# Patient Record
Sex: Male | Born: 1940 | Race: White | Hispanic: No | Smoking: Former smoker
Health system: Southern US, Community
[De-identification: ages and names within clinical notes are randomized; demographics above are authoritative.]

## PROBLEM LIST (undated history)

## (undated) DIAGNOSIS — K5790 Diverticulosis of intestine, part unspecified, without perforation or abscess without bleeding: Secondary | ICD-10-CM

## (undated) DIAGNOSIS — Z8719 Personal history of other diseases of the digestive system: Secondary | ICD-10-CM

## (undated) DIAGNOSIS — Z972 Presence of dental prosthetic device (complete) (partial): Secondary | ICD-10-CM

## (undated) DIAGNOSIS — S88911A Complete traumatic amputation of right lower leg, level unspecified, initial encounter: Secondary | ICD-10-CM

## (undated) DIAGNOSIS — I700 Atherosclerosis of aorta: Secondary | ICD-10-CM

## (undated) DIAGNOSIS — J449 Chronic obstructive pulmonary disease, unspecified: Secondary | ICD-10-CM

## (undated) DIAGNOSIS — K635 Polyp of colon: Secondary | ICD-10-CM

## (undated) DIAGNOSIS — E785 Hyperlipidemia, unspecified: Secondary | ICD-10-CM

## (undated) DIAGNOSIS — I2510 Atherosclerotic heart disease of native coronary artery without angina pectoris: Secondary | ICD-10-CM

## (undated) DIAGNOSIS — N1330 Unspecified hydronephrosis: Secondary | ICD-10-CM

## (undated) DIAGNOSIS — N201 Calculus of ureter: Secondary | ICD-10-CM

## (undated) DIAGNOSIS — E559 Vitamin D deficiency, unspecified: Secondary | ICD-10-CM

## (undated) DIAGNOSIS — H269 Unspecified cataract: Secondary | ICD-10-CM

## (undated) DIAGNOSIS — C4490 Unspecified malignant neoplasm of skin, unspecified: Secondary | ICD-10-CM

## (undated) DIAGNOSIS — K222 Esophageal obstruction: Secondary | ICD-10-CM

## (undated) DIAGNOSIS — G459 Transient cerebral ischemic attack, unspecified: Secondary | ICD-10-CM

## (undated) DIAGNOSIS — I219 Acute myocardial infarction, unspecified: Secondary | ICD-10-CM

## (undated) DIAGNOSIS — I519 Heart disease, unspecified: Secondary | ICD-10-CM

## (undated) DIAGNOSIS — K219 Gastro-esophageal reflux disease without esophagitis: Secondary | ICD-10-CM

## (undated) DIAGNOSIS — Z87891 Personal history of nicotine dependence: Secondary | ICD-10-CM

## (undated) DIAGNOSIS — J45909 Unspecified asthma, uncomplicated: Secondary | ICD-10-CM

## (undated) DIAGNOSIS — N200 Calculus of kidney: Secondary | ICD-10-CM

## (undated) HISTORY — DX: Polyp of colon: K63.5

## (undated) HISTORY — DX: Unspecified asthma, uncomplicated: J45.909

## (undated) HISTORY — PX: CATARACT EXTRACTION, BILATERAL: SHX1313

## (undated) HISTORY — DX: Esophageal obstruction: K22.2

## (undated) HISTORY — DX: Acute myocardial infarction, unspecified: I21.9

## (undated) HISTORY — DX: Personal history of nicotine dependence: Z87.891

## (undated) HISTORY — DX: Gastro-esophageal reflux disease without esophagitis: K21.9

## (undated) HISTORY — DX: Vitamin D deficiency, unspecified: E55.9

## (undated) HISTORY — PX: ESOPHAGOGASTRODUODENOSCOPY: SHX1529

## (undated) HISTORY — PX: INGUINAL HERNIA REPAIR: SHX194

## (undated) HISTORY — DX: Transient cerebral ischemic attack, unspecified: G45.9

## (undated) HISTORY — DX: Diverticulosis of intestine, part unspecified, without perforation or abscess without bleeding: K57.90

## (undated) HISTORY — PX: COLONOSCOPY: SHX174

---

## 1977-05-14 HISTORY — PX: OTHER SURGICAL HISTORY: SHX169

## 1981-06-08 HISTORY — PX: CARDIAC CATHETERIZATION: SHX172

## 1997-07-12 ENCOUNTER — Ambulatory Visit (HOSPITAL_COMMUNITY): Admission: RE | Admit: 1997-07-12 | Discharge: 1997-07-12 | Payer: Self-pay | Admitting: Internal Medicine

## 1998-06-08 HISTORY — PX: SHOULDER ARTHROSCOPY: SHX128

## 1998-12-21 ENCOUNTER — Emergency Department (HOSPITAL_COMMUNITY): Admission: EM | Admit: 1998-12-21 | Discharge: 1998-12-21 | Payer: Self-pay | Admitting: Emergency Medicine

## 1998-12-21 ENCOUNTER — Encounter: Payer: Self-pay | Admitting: Emergency Medicine

## 1999-01-31 ENCOUNTER — Encounter: Payer: Self-pay | Admitting: Orthopedic Surgery

## 1999-01-31 ENCOUNTER — Ambulatory Visit (HOSPITAL_COMMUNITY): Admission: RE | Admit: 1999-01-31 | Discharge: 1999-01-31 | Payer: Self-pay | Admitting: Orthopedic Surgery

## 1999-02-26 ENCOUNTER — Encounter: Payer: Self-pay | Admitting: Orthopedic Surgery

## 1999-03-06 ENCOUNTER — Observation Stay (HOSPITAL_COMMUNITY): Admission: RE | Admit: 1999-03-06 | Discharge: 1999-03-07 | Payer: Self-pay | Admitting: Orthopedic Surgery

## 1999-10-13 ENCOUNTER — Encounter: Payer: Self-pay | Admitting: Internal Medicine

## 1999-10-13 ENCOUNTER — Ambulatory Visit (HOSPITAL_COMMUNITY): Admission: RE | Admit: 1999-10-13 | Discharge: 1999-10-13 | Payer: Self-pay | Admitting: Internal Medicine

## 2001-04-11 ENCOUNTER — Encounter: Payer: Self-pay | Admitting: Internal Medicine

## 2001-04-11 ENCOUNTER — Ambulatory Visit (HOSPITAL_COMMUNITY): Admission: RE | Admit: 2001-04-11 | Discharge: 2001-04-11 | Payer: Self-pay | Admitting: Internal Medicine

## 2001-04-29 ENCOUNTER — Emergency Department (HOSPITAL_COMMUNITY): Admission: EM | Admit: 2001-04-29 | Discharge: 2001-04-29 | Payer: Self-pay | Admitting: Emergency Medicine

## 2002-06-06 ENCOUNTER — Ambulatory Visit (HOSPITAL_COMMUNITY): Admission: RE | Admit: 2002-06-06 | Discharge: 2002-06-06 | Payer: Self-pay | Admitting: Internal Medicine

## 2002-06-06 ENCOUNTER — Encounter: Payer: Self-pay | Admitting: Internal Medicine

## 2004-12-10 ENCOUNTER — Ambulatory Visit (HOSPITAL_COMMUNITY): Admission: RE | Admit: 2004-12-10 | Discharge: 2004-12-10 | Payer: Self-pay | Admitting: Internal Medicine

## 2004-12-17 ENCOUNTER — Ambulatory Visit: Payer: Self-pay | Admitting: Internal Medicine

## 2004-12-25 ENCOUNTER — Encounter (INDEPENDENT_AMBULATORY_CARE_PROVIDER_SITE_OTHER): Payer: Self-pay | Admitting: *Deleted

## 2004-12-25 ENCOUNTER — Ambulatory Visit: Payer: Self-pay | Admitting: Internal Medicine

## 2007-04-04 ENCOUNTER — Ambulatory Visit (HOSPITAL_COMMUNITY): Admission: RE | Admit: 2007-04-04 | Discharge: 2007-04-04 | Payer: Self-pay | Admitting: Internal Medicine

## 2008-05-29 ENCOUNTER — Ambulatory Visit (HOSPITAL_COMMUNITY): Admission: RE | Admit: 2008-05-29 | Discharge: 2008-05-29 | Payer: Self-pay | Admitting: Internal Medicine

## 2009-06-10 ENCOUNTER — Ambulatory Visit (HOSPITAL_COMMUNITY): Admission: RE | Admit: 2009-06-10 | Discharge: 2009-06-10 | Payer: Self-pay | Admitting: Internal Medicine

## 2009-11-22 ENCOUNTER — Encounter (INDEPENDENT_AMBULATORY_CARE_PROVIDER_SITE_OTHER): Payer: Self-pay | Admitting: *Deleted

## 2010-06-24 ENCOUNTER — Ambulatory Visit (HOSPITAL_COMMUNITY)
Admission: RE | Admit: 2010-06-24 | Discharge: 2010-06-24 | Payer: Self-pay | Source: Home / Self Care | Attending: Internal Medicine | Admitting: Internal Medicine

## 2010-07-08 NOTE — Letter (Signed)
Summary: Colonoscopy Letter  [facility]  [address] [place], [place] [address]   Phone: [phone]  Fax: [phone]      November 22, 2009 MRN: [id number]   [facility] [address] [place], [place]  [address]   Dear Mr. Weekley,   According to your medical record, it is time for you to schedule a Colonoscopy. The American Cancer Society recommends this procedure as a method to detect early colon cancer. Patients with a family history of colon cancer, or a personal history of colon polyps or inflammatory bowel disease are at increased risk.  This letter has beeen generated based on the recommendations made at the time of your procedure. If you feel that in your particular situation this may no longer apply, please contact our office.  Please call our office at [phone] to schedule this appointment or to update your records at your earliest convenience.  Thank you for cooperating with [place] to provide you with the very best care possible.   Sincerely,    Iva Boop, M.D.  [facility] [phone]

## 2010-07-11 ENCOUNTER — Encounter (INDEPENDENT_AMBULATORY_CARE_PROVIDER_SITE_OTHER): Payer: Self-pay | Admitting: *Deleted

## 2010-07-16 NOTE — Letter (Signed)
Summary: Pre Visit No Show Letter  [facility]  [address] [place], [place] [address]   Phone: [phone]  Fax: [phone]        July 11, 2010 MRN: [id number]    [facility] [address] [place], [place]  [address]    Dear Mr. Brathwaite,   We have been unable to reach you by phone concerning the pre-procedure visit that you missed on Friday 07/11/2010. For this reason,your procedure scheduled on Tuesday 08/12/2010 has been cancelled. Our scheduling staff will gladly assist you with rescheduling your appointments at a more convenient time. Please call our office at [phone] between the hours of 8:00am and 5:00pm, press option #2 to reach an appointment scheduler. Please consider updating your contact numbers at this time so that we can reach you by phone in the future with schedule changes or results.    Thank you,    Ezra Sites RN [facility]

## 2010-07-23 ENCOUNTER — Other Ambulatory Visit: Payer: Self-pay | Admitting: Internal Medicine

## 2010-07-28 ENCOUNTER — Encounter (INDEPENDENT_AMBULATORY_CARE_PROVIDER_SITE_OTHER): Payer: Self-pay

## 2010-07-29 ENCOUNTER — Encounter: Payer: Self-pay | Admitting: Internal Medicine

## 2010-08-05 NOTE — Miscellaneous (Signed)
Summary: Lec previsit  Clinical Lists Changes  Medications: Added new medication of MOVIPREP 100 GM  SOLR (PEG-KCL-NACL-NASULF-NA ASC-C) As per prep instructions. - Signed Rx of MOVIPREP 100 GM  SOLR (PEG-KCL-NACL-NASULF-NA ASC-C) As per prep instructions.;  #1 x 0;  Signed;  Entered by: Ulis Rias RN;  Authorized by: Iva Boop MD, [facility];  Method used: Electronically to [facility] [address] [place], [place], [place]  [address], Ph: [phone], Fax: [phone] Observations: Added new observation of NKA: T (07/29/2010 13:28)    Prescriptions: MOVIPREP 100 GM  SOLR (PEG-KCL-NACL-NASULF-NA ASC-C) As per prep instructions.  #1 x 0   Entered by:   Ulis Rias RN   Authorized by:   Iva Boop MD, [facility]   Signed by:   Ulis Rias RN on 07/29/2010   Method used:   Electronically to        [facility] (retail)       [address]       [place], [place]  [address]       Ph: [phone]       Fax: [phone]   RxID:   [id number]

## 2010-08-05 NOTE — Letter (Signed)
Summary: [facility] Instructions  [facility]  [address] [place], [place] [address]   Phone: [phone]  Fax: [phone]       Leslie Cardenas    [birth year]    MRN: [id number]        Procedure Day /Date: Tuesday 08/12/2010     Arrival Time: 1:00 pm     Procedure Time: 2:00 pm     Location of Procedure:                    _ x_  [facility] (4th Floor)                        PREPARATION FOR COLONOSCOPY WITH MOVIPREP   Starting 5 days prior to your procedure Thursday 3/1 do not eat nuts, seeds, popcorn, corn, beans, peas,  salads, or any raw vegetables.  Do not take any fiber supplements (e.g. Metamucil, Citrucel, and Benefiber).  THE DAY BEFORE YOUR PROCEDURE         DATE: Wednesday 3/5  1.  Drink clear liquids the entire day-NO SOLID FOOD  2.  Do not drink anything colored red or purple.  Avoid juices with pulp.  No orange juice.  3.  Drink at least 64 oz. (8 glasses) of fluid/clear liquids during the day to prevent dehydration and help the prep work efficiently.  CLEAR LIQUIDS INCLUDE: Water Jello Ice Popsicles Tea (sugar ok, no milk/cream) Powdered fruit flavored drinks Coffee (sugar ok, no milk/cream) Gatorade Juice: apple, white grape, white cranberry  Lemonade Clear bullion, consomm, broth Carbonated beverages (any kind) Strained chicken noodle soup Hard Candy                             4.  In the morning, mix first dose of MoviPrep solution:    Empty 1 Pouch A and 1 Pouch B into the disposable container    Add lukewarm drinking water to the top line of the container. Mix to dissolve    Refrigerate (mixed solution should be used within 24 hrs)  5.  Begin drinking the prep at 5:00 p.m. The MoviPrep container is divided by 4 marks.   Every 15 minutes drink the solution down to the next mark (approximately 8 oz) until the full liter is complete.   6.  Follow completed prep with 16 oz of clear liquid of your choice (Nothing  red or purple).  Continue to drink clear liquids until bedtime.  7.  Before going to bed, mix second dose of MoviPrep solution:    Empty 1 Pouch A and 1 Pouch B into the disposable container    Add lukewarm drinking water to the top line of the container. Mix to dissolve    Refrigerate  THE DAY OF YOUR PROCEDURE      DATE: Thursday 3/6  Beginning at 9:00 a.m. (5 hours before procedure):         1. Every 15 minutes, drink the solution down to the next mark (approx 8 oz) until the full liter is complete.  2. Follow completed prep with 16 oz. of clear liquid of your choice.    3. You may drink clear liquids until 12:00 pm (2 HOURS BEFORE PROCEDURE).   MEDICATION INSTRUCTIONS  Unless otherwise instructed, you should take regular prescription medications with a small sip of water   as early as possible the morning of your procedure.  OTHER INSTRUCTIONS  You will need a responsible adult at least 71 years of age to accompany you and drive you home.   This person must remain in the waiting room during your procedure.  Wear loose fitting clothing that is easily removed.  Leave jewelry and other valuables at home.  However, you may wish to bring a book to read or  an iPod/MP3 player to listen to music as you wait for your procedure to start.  Remove all body piercing jewelry and leave at home.  Total time from sign-in until discharge is approximately 2-3 hours.  You should go home directly after your procedure and rest.  You can resume normal activities the  day after your procedure.  The day of your procedure you should not:   Drive   Make legal decisions   Operate machinery   Drink alcohol   Return to work  You will receive specific instructions about eating, activities and medications before you leave.    The above instructions have been reviewed and explained to me by   Ulis Rias RN  July 29, 2010 1:40 PM     I fully understand and can  verbalize these instructions _____________________________ Date _________

## 2010-08-12 ENCOUNTER — Other Ambulatory Visit: Payer: Self-pay | Admitting: Internal Medicine

## 2010-08-12 ENCOUNTER — Other Ambulatory Visit (AMBULATORY_SURGERY_CENTER): Payer: 59 | Admitting: Internal Medicine

## 2010-08-12 DIAGNOSIS — D126 Benign neoplasm of colon, unspecified: Secondary | ICD-10-CM

## 2010-08-12 DIAGNOSIS — K5730 Diverticulosis of large intestine without perforation or abscess without bleeding: Secondary | ICD-10-CM

## 2010-08-12 DIAGNOSIS — Z1211 Encounter for screening for malignant neoplasm of colon: Secondary | ICD-10-CM

## 2010-08-12 DIAGNOSIS — Z860100 Personal history of colon polyps, unspecified: Secondary | ICD-10-CM

## 2010-08-12 HISTORY — PX: COLONOSCOPY W/ POLYPECTOMY: SHX1380

## 2010-08-12 LAB — HM COLONOSCOPY

## 2010-08-19 ENCOUNTER — Encounter: Payer: Self-pay | Admitting: Internal Medicine

## 2010-08-19 NOTE — Procedures (Addendum)
Summary: Colonoscopy  Patient: Leslie Cardenas Note: All result statuses are Final unless otherwise noted.  Tests: (1) Colonoscopy (COL)   COL Colonoscopy           DONE     [facility]     [address]     [place], [place]  [address]          COLONOSCOPY PROCEDURE REPORT          PATIENT:  Leslie Cardenas, Leslie Cardenas  MR#:  [id number]     BIRTHDATE:  Jul 27, 1940, 69 yrs. old  GENDER:  male     ENDOSCOPIST:  Iva Boop, MD, [facility]     REF. 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  Digital rectal exam was performed and     revealed no abnormalities.   The LB 180AL [id number] endoscope was     introduced through the anus and advanced to the cecum, which was     identified by both the appendix and ileocecal valve, without     limitations.  The quality of the prep was excellent, using     MoviPrep.  The instrument was then slowly withdrawn as the colon     was fully examined. Insertion: 2:50 minutes Withdrawal: 7:22     minutes     <<PROCEDUREIMAGES>>          FINDINGS:  Three polyps were found. Descending (2, </=41mm), and     transverse colon (6-59mm). Polyps were snared without cautery.     Retrieval was successful.  Moderate diverticulosis was found in     the sigmoid colon.  This was otherwise a normal examination of the     colon.   Retroflexed views in the rectum revealed no     abnormalities.    The scope was then withdrawn from the patient     and the procedure completed.          COMPLICATIONS:   None     ENDOSCOPIC IMPRESSION:     1) Three polyps removed, largest 6-7 mm     2) Moderate diverticulosis in the sigmoid colon     3) Otherwise normal examination     4) Personal history adenoma removal (2), largest 6 mm, 12/2004          REPEAT EXAM:  In for Colonoscopy, pending biopsy results.          Iva Boop, MD, [facility]          CC:  Lucky Cowboy, MD     The Patient          n.     eSIGNED:   Iva Boop at 08/12/2010 02:25 PM          [name], [id number]  Note: An exclamation mark (!) indicates a result that was  not dispersed into the flowsheet. Document Creation Date: 08/12/2010 2:26 PM _______________________________________________________________________  (1) Order result status: Final Collection or observation date-time: 08/12/2010 14:15 Requested date-time:  Receipt date-time:  Reported date-time:  Referring Physician:   Ordering Physician: Stan Head [phone]) Specimen Source:  Source: Launa Grill Order Number: [id number] Lab site:   Appended Document: Colonoscopy     Procedures Next Due Date:    Colonoscopy: 08/2013

## 2010-08-26 NOTE — Letter (Signed)
Summary: Patient Notice- Polyp Results  [facility]  [address] [place], [place] [address]   Phone: [phone]  Fax: [phone]        August 19, 2010 MRN: [id number]    [facility] [address] [place], [place]  [address]    Dear Leslie Cardenas,  The polyps removed from your colon were adenomatous. This means that they were pre-cancerous or that  they had the potential to change into cancer over time.   I recommend that you have a repeat colonoscopy in about 3 years (2015) to determine if you have developed any new polyps over time and screen for colorectal cancer. If you develop any new rectal bleeding, abdominal pain or significant bowel habit changes, please contact us before then.  In addition to repeating colonoscopy, changing health habits may reduce your risk of having more colon or rectal  polyps and possibly, colorectal cancer. You may lower your risk of future polyps and colorectal cancer by adopting healthy habits such as not smoking or using tobacco (if you do), being physically active, losing weight (if overweight), and eating a diet which includes fruits and vegetables and limits red meat.  Please call us if you are having persistent problems or have questions about your condition that have not been fully answered at this time.  Sincerely,  Iva Boop MD, [facility]  This letter has been electronically signed by your physician.  Appended Document: Patient Notice- Polyp Results letter mailed

## 2011-11-13 ENCOUNTER — Encounter (HOSPITAL_COMMUNITY): Payer: Self-pay | Admitting: Emergency Medicine

## 2011-11-13 ENCOUNTER — Other Ambulatory Visit: Payer: Self-pay

## 2011-11-13 ENCOUNTER — Emergency Department (HOSPITAL_COMMUNITY)
Admission: EM | Admit: 2011-11-13 | Discharge: 2011-11-13 | Disposition: A | Discharge status: Home / Self Care | Payer: 59 | Source: Home / Self Care | Attending: Emergency Medicine | Admitting: Emergency Medicine

## 2011-11-13 ENCOUNTER — Emergency Department (HOSPITAL_COMMUNITY): Payer: Medicare PPO

## 2011-11-13 ENCOUNTER — Inpatient Hospital Stay (HOSPITAL_COMMUNITY)
Admission: EM | Admit: 2011-11-13 | Discharge: 2011-11-14 | DRG: 313 | Disposition: A | Discharge status: Home / Self Care | Payer: Medicare PPO | Attending: Cardiology | Admitting: Cardiology

## 2011-11-13 DIAGNOSIS — I200 Unstable angina: Secondary | ICD-10-CM

## 2011-11-13 DIAGNOSIS — J449 Chronic obstructive pulmonary disease, unspecified: Secondary | ICD-10-CM

## 2011-11-13 DIAGNOSIS — E782 Mixed hyperlipidemia: Secondary | ICD-10-CM

## 2011-11-13 DIAGNOSIS — J4489 Other specified chronic obstructive pulmonary disease: Secondary | ICD-10-CM | POA: Diagnosis present

## 2011-11-13 DIAGNOSIS — Z7982 Long term (current) use of aspirin: Secondary | ICD-10-CM

## 2011-11-13 DIAGNOSIS — I10 Essential (primary) hypertension: Secondary | ICD-10-CM | POA: Diagnosis present

## 2011-11-13 DIAGNOSIS — I2510 Atherosclerotic heart disease of native coronary artery without angina pectoris: Secondary | ICD-10-CM | POA: Diagnosis present

## 2011-11-13 DIAGNOSIS — I252 Old myocardial infarction: Secondary | ICD-10-CM

## 2011-11-13 DIAGNOSIS — R079 Chest pain, unspecified: Secondary | ICD-10-CM

## 2011-11-13 DIAGNOSIS — E785 Hyperlipidemia, unspecified: Secondary | ICD-10-CM | POA: Diagnosis present

## 2011-11-13 DIAGNOSIS — R0789 Other chest pain: Principal | ICD-10-CM | POA: Diagnosis present

## 2011-11-13 DIAGNOSIS — Z8249 Family history of ischemic heart disease and other diseases of the circulatory system: Secondary | ICD-10-CM

## 2011-11-13 DIAGNOSIS — R001 Bradycardia, unspecified: Secondary | ICD-10-CM

## 2011-11-13 DIAGNOSIS — IMO0002 Reserved for concepts with insufficient information to code with codable children: Secondary | ICD-10-CM

## 2011-11-13 DIAGNOSIS — Z87891 Personal history of nicotine dependence: Secondary | ICD-10-CM

## 2011-11-13 DIAGNOSIS — Z79899 Other long term (current) drug therapy: Secondary | ICD-10-CM

## 2011-11-13 DIAGNOSIS — I498 Other specified cardiac arrhythmias: Secondary | ICD-10-CM | POA: Diagnosis present

## 2011-11-13 HISTORY — DX: Atherosclerotic heart disease of native coronary artery without angina pectoris: I25.10

## 2011-11-13 HISTORY — DX: Complete traumatic amputation of right lower leg, level unspecified, initial encounter: S88.911A

## 2011-11-13 HISTORY — DX: Hyperlipidemia, unspecified: E78.5

## 2011-11-13 HISTORY — DX: Chronic obstructive pulmonary disease, unspecified: J44.9

## 2011-11-13 LAB — DIFFERENTIAL
Eosinophils Absolute: 0.2 10*3/uL (ref 0.0–0.7)
Eosinophils Relative: 3 % (ref 0–5)
Lymphocytes Relative: 20 % (ref 12–46)
Monocytes Relative: 8 % (ref 3–12)
Neutro Abs: 4.9 10*3/uL (ref 1.7–7.7)
Neutrophils Relative %: 68 % (ref 43–77)

## 2011-11-13 LAB — CBC
HCT: 38.9 % — ABNORMAL LOW (ref 39.0–52.0)
Hemoglobin: 14 g/dL (ref 13.0–17.0)
Hemoglobin: 13.8 g/dL (ref 13.0–17.0)
MCH: 34.1 pg — ABNORMAL HIGH (ref 26.0–34.0)
MCHC: 35.3 g/dL (ref 30.0–36.0)
MCHC: 35.5 g/dL (ref 30.0–36.0)
MCV: 96.1 fL (ref 78.0–100.0)
MCV: 96 fL (ref 78.0–100.0)
Platelets: 322 10*3/uL (ref 150–400)
RBC: 4.13 MIL/uL — ABNORMAL LOW (ref 4.22–5.81)
RDW: 12.9 % (ref 11.5–15.5)
RDW: 12.9 % (ref 11.5–15.5)

## 2011-11-13 LAB — BASIC METABOLIC PANEL
CO2: 27 mEq/L (ref 19–32)
Chloride: 101 mEq/L (ref 96–112)
GFR calc non Af Amer: 83 mL/min — ABNORMAL LOW (ref >90)
Glucose, Bld: 93 mg/dL (ref 70–99)
Potassium: 3.6 mEq/L (ref 3.5–5.1)

## 2011-11-13 LAB — CARDIAC PANEL(CRET KIN+CKTOT+MB+TROPI): Total CK: 22 U/L (ref 7–232)

## 2011-11-13 MED ORDER — SODIUM CHLORIDE 0.9 % IV SOLN
Freq: Once | INTRAVENOUS | Status: AC
  Administered 2011-11-13: 17:00:00 via INTRAVENOUS

## 2011-11-13 MED ORDER — ACETAMINOPHEN 325 MG PO TABS: 650.0000 mg | ORAL_TABLET | ORAL | Status: DC | PRN

## 2011-11-13 MED ORDER — NITROGLYCERIN 0.4 MG SL SUBL: 0.4000 mg | SUBLINGUAL_TABLET | SUBLINGUAL | Status: DC | PRN

## 2011-11-13 MED ORDER — NITROGLYCERIN 0.4 MG SL SUBL
SUBLINGUAL_TABLET | SUBLINGUAL | Status: AC
  Filled 2011-11-13: qty 25

## 2011-11-13 MED ORDER — SODIUM CHLORIDE 0.9 % IJ SOLN
3.0000 mL | Freq: Two times a day (BID) | INTRAMUSCULAR | Status: DC
  Administered 2011-11-14: 3 mL via INTRAVENOUS

## 2011-11-13 MED ORDER — ALPRAZOLAM 0.25 MG PO TABS: 0.2500 mg | ORAL_TABLET | Freq: Two times a day (BID) | ORAL | Status: DC | PRN

## 2011-11-13 MED ORDER — ASPIRIN EC 81 MG PO TBEC
81.0000 mg | DELAYED_RELEASE_TABLET | Freq: Every day | ORAL | Status: DC
  Administered 2011-11-14: 81 mg via ORAL
  Filled 2011-11-13: qty 1

## 2011-11-13 MED ORDER — SODIUM CHLORIDE 0.9 % IV SOLN: 250.0000 mL | INTRAVENOUS | Status: DC | PRN

## 2011-11-13 MED ORDER — NITROGLYCERIN 0.4 MG SL SUBL
0.4000 mg | SUBLINGUAL_TABLET | SUBLINGUAL | Status: DC | PRN
  Administered 2011-11-13 (×2): 0.4 mg via SUBLINGUAL

## 2011-11-13 MED ORDER — ASPIRIN EC 325 MG PO TBEC: 325.0000 mg | DELAYED_RELEASE_TABLET | Freq: Once | ORAL | Status: DC

## 2011-11-13 MED ORDER — ZOLPIDEM TARTRATE 5 MG PO TABS: 5.0000 mg | ORAL_TABLET | Freq: Every evening | ORAL | Status: DC | PRN

## 2011-11-13 MED ORDER — ENOXAPARIN SODIUM 40 MG/0.4ML ~~LOC~~ SOLN
40.0000 mg | SUBCUTANEOUS | Status: DC
  Administered 2011-11-13: 40 mg via SUBCUTANEOUS
  Filled 2011-11-13 (×2): qty 0.4

## 2011-11-13 MED ORDER — HEPARIN BOLUS VIA INFUSION: 4000.0000 [IU] | Freq: Once | INTRAVENOUS | Status: DC

## 2011-11-13 MED ORDER — ASPIRIN 81 MG PO CHEW
CHEWABLE_TABLET | ORAL | Status: AC
  Filled 2011-11-13: qty 4

## 2011-11-13 MED ORDER — ONDANSETRON HCL 4 MG/2ML IJ SOLN: 4.0000 mg | Freq: Four times a day (QID) | INTRAMUSCULAR | Status: DC | PRN

## 2011-11-13 MED ORDER — ATORVASTATIN CALCIUM 40 MG PO TABS
40.0000 mg | ORAL_TABLET | Freq: Every day | ORAL | Status: DC
  Filled 2011-11-13: qty 1

## 2011-11-13 MED ORDER — HEPARIN (PORCINE) IN NACL 100-0.45 UNIT/ML-% IJ SOLN
1000.0000 [IU]/h | Freq: Once | INTRAMUSCULAR | Status: DC
  Filled 2011-11-13: qty 250

## 2011-11-13 MED ORDER — MORPHINE SULFATE 2 MG/ML IJ SOLN
2.0000 mg | Freq: Once | INTRAMUSCULAR | Status: AC
  Administered 2011-11-13: 2 mg via INTRAVENOUS
  Filled 2011-11-13: qty 1

## 2011-11-13 MED ORDER — SODIUM CHLORIDE 0.9 % IJ SOLN: 3.0000 mL | INTRAMUSCULAR | Status: DC | PRN

## 2011-11-13 MED ORDER — ASPIRIN 81 MG PO CHEW
324.0000 mg | CHEWABLE_TABLET | Freq: Once | ORAL | Status: AC
Start: 2011-11-13 — End: 2011-11-13
  Administered 2011-11-13: 324 mg via ORAL

## 2011-11-13 NOTE — ED Notes (Signed)
Per [facility]: pt from [facility] c/o 10/10 CP midsternal; pt given 2 SL nitro without effect; pt sts exertional SOB; pt denies diaphoresis or dizziness; pt sent here for further eval

## 2011-11-13 NOTE — ED Notes (Signed)
Pt reports has been having pain for past few weeks in mid chest. Pain starting at night, 8pm, takes a few motrin and it goes away, has been waking up at night to cough. Pain at rest, pain like a cramp that squeezes , comes and goes , pain worse w breathing deep, or movement. Trip over here, would feel like he had been punched in chest w every lurch of car (stop and start)

## 2011-11-13 NOTE — ED Notes (Signed)
EDP notified of change in HR

## 2011-11-13 NOTE — ED Notes (Signed)
Cardiology MD at bedside.

## 2011-11-13 NOTE — H&P (Signed)
History and Physical   Patient ID: Leslie Cardenas MRN: [id number], DOB/AGE: Jul 24, 1940   Admit date: 11/13/2011 Date of Consult: 11/13/2011   Primary Physician: No primary provider on file. Primary Cardiologist: New to cardiology  Pt. Profile: 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   Problem List: Past Medical History  Diagnosis Date  . MI (myocardial infarction) 1983  . Coronary artery disease   . COPD (chronic obstructive pulmonary disease)   . Traumatic amputation of right leg   . Hyperlipidemia     Past Surgical History  Procedure Date  . Right leg amputation   . Cardiac catheterization 1983    No intervention performed.      Allergies: No Known Allergies  HPI:  He reports experiencing substernal chest pain radiating to his left chest occurring reliably in the evening each day for the past week. He describes it as a pressure-like discomfort lasting 2-3 hours at a time. The first time it occurred, he took Tums without relief. Later in the week he took Advil x 4 with relief. Aggravating factors include position changes and inspiration. He endorses associated SOB (has SOB at baseline), increased DOE, occasional palpitations, lightheadedness. He denies n/v, diaphoresis, LE edema or PND. He notes baseline chronic cough productive of white sputum. He denies fevers, chills, diarrhea or sick contacts. No recent illness.   Upon ED arrival, EKG revealed no ischemia changes. Initial troponin-I WNL. pBNP WNL. CBC and BMET unremarkable. CXR without evidence of acute cardiopulmonary disease. Mild bibasilar scarring vs atelectasis noted. He was given morphine 2mg  IV with mild improvement.   On interview, pt's HR noted to be in high 40s. Asymptomatic.   Home Medications: Prior to Admission medications   Medication Sig Start Date End Date Taking? Authorizing  Provider  predniSONE (DELTASONE) 10 MG tablet Take 10 mg by mouth every other day. Take on even-numbered days.   Yes Historical Provider, MD    Inpatient Medications:     . heparin  1,000 Units/hr Intravenous Once  . heparin  4,000 Units Intravenous Once  .  morphine injection  2 mg Intravenous Once  . DISCONTD: aspirin EC  325 mg Oral Once    (Not in a hospital admission)  Family History  Problem Relation Age of Onset  . Hypertension Father      History   Social History  . Marital Status: Divorced    Spouse Name: N/A    Number of Children: 4  . Years of Education: N/A   Occupational History  .     Social History Main Topics  . Smoking status: Former Smoker -- 3.0 packs/day for 20 years    Types: Cigarettes    Quit date: 11/13/1999  . Smokeless tobacco: Not on file  . Alcohol Use: No  . Drug Use: No  . Sexually Active: Not on file   Other Topics Concern  . Not on file   Social History Narrative   Lives in [place], [place].      Review of Systems: General:  negative for chills, fever, night sweats or weight changes.  Cardiovascular: positive for chest pain, sob, DOE, palpitations,  negative for edema, paroxysmal nocturnal dyspnea  Dermatological: negative for rash Respiratory: positive for cough and occasionaly wheezing Urologic:  negative for hematuria Abdominal: negative for nausea, vomiting, diarrhea, bright red blood per rectum, melena,  or hematemesis Neurologic:  negative for visual changes, syncope All other systems reviewed and are otherwise negative except as noted above.  Physical Exam: Blood pressure 117/75, pulse 48, temperature 97.8 F (36.6 C), temperature source Oral, resp. rate 18, height 5\' 9"  (1.753 m), weight 70.308 kg (155 lb), SpO2 100.00%.    General: Well developed, well nourished, in no acute distress. Head: Normocephalic, atraumatic, sclera non-icteric, no xanthomas, nares are without discharge. Neck: Negative for carotid bruits.  JVD not elevated. Lungs: Distant breath sounds. Occasional wheezing noted. Clear bilaterally to auscultation without wheezes, rales, or rhonchi. Breathing is unlabored. Heart: RRR/bradycardic with S1 S2. No murmurs, rubs, or gallops appreciated. Abdomen:  Soft, non-tender, non-distended with normoactive bowel sounds. No hepatomegaly. No rebound/guarding. No obvious abdominal masses. Msk:   Strength and tone appears normal for age. Extremities: RLE amputation. RLE- no clubbing, cyanosis or edema.  Distal pedal pulses are 2+ and equal bilaterally. Neuro: Alert and oriented X 3. Moves all extremities spontaneously. Psych:  Responds to questions appropriately with a normal affect.  Labs: Recent Labs  Basename 11/13/11 1715   WBC 7.2   HGB 14.0   HCT 39.7   MCV 96.1   PLT 322    Lab 11/13/11 1715  NA 137  K 3.6  CL 101  CO2 27  BUN 11  CREATININE 0.92  CALCIUM 9.5  PROT --  BILITOT --  ALKPHOS --  ALT --  AST --  AMYLASE --  LIPASE --  GLUCOSE 93   Recent Labs  Basename 11/13/11 1718   CKTOTAL --   CKMB --   CKMBINDEX --   TROPONINI <0.30   Radiology/Studies: Dg Chest Portable 1 View  11/13/2011  *RADIOLOGY REPORT*  Clinical Data: Chest pain  PORTABLE CHEST - 1 VIEW  [facility]24/2010  Findings: Mild bibasilar scarring versus atelectasis.  Lungs otherwise essentially clear. No pleural effusion or pneumothorax.  Cardiomediastinal silhouette is within normal limits.  IMPRESSION: No evidence of acute cardiopulmonary disease.  Mild bibasilar scarring versus atelectasis.  Original Report Authenticated By: Charline Bills, M.D.    EKG: Sinus bradycardia, 53 bpm, TW flattening/inversion V1-V2, small (<0.5 mm ST depression inferolaterally)  ASSESSMENT AND PLAN:   1. Chest pain 2. Hyperlipidemia 3. COPD   DISCUSSION/PLAN:   Low-suspicion for cardiac source. Patient notes substernal chest pain worse with position and deep  inspiration. Noted improvement with Advil. Not associated with meals or exertion. There is some SOB and DOE, but may represent baseline COPD. In the ED, EKG is nonacute, cardiac enzymes are WNL. No acute process on CXR. CBC and BMET unremarkable. Given his history of CAD and cardiac risk factors, will admit for ACS rule-out. Low-suspicion for PE. Cycle cardiac biomarkers. Will check A1C, lipid panel to risk stratify. Plan for outpatient [facility] after discharge should he rule-out. Will add Mucinex for productive cough. Order 2D echo in the AM.    Signed, R. Hurman Horn, PA-C 11/13/2011, 7:54 PM   Patient seen in ER with Mr. Francee Gentile. Agree with findings and plan.  His chest pain is atypical and has a pleuritic component.  Pain is precordial and does not radiate. Worse with movement or sitting up in bed for exam. No pericardial or pleural rub heard. EKG shows no acute changes or changes of pericarditis.  Of note is that he has had a recent chest cold without any fever but with increased thick phlegm in the mornings. 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

## 2011-11-13 NOTE — ED Provider Notes (Signed)
I have personally seen and examined the patient.  I have discussed the plan of care with the resident.  I have reviewed the documentation on PMH/FH/Soc. History.  I have reviewed the documentation of the resident and agree.  I have reviewed and agree with the ECG interpretation(s) documented by the resident.  Pt seen with resident, h/o CAD here with anginal type pain will need admission   Joya Gaskins, MD 11/13/11 2353

## 2011-11-13 NOTE — ED Notes (Signed)
Heparin held at this time per cardiology.

## 2011-11-13 NOTE — ED Provider Notes (Addendum)
History     CSN: [id number]  Arrival date & time 11/13/11  1525   First MD Initiated Contact with Patient 11/13/11 1545      Chief Complaint  Patient presents with  . Chest Pain    (Consider location/radiation/quality/duration/timing/severity/associated sxs/prior treatment) HPI Comments: Patient with history of COPD, MI 1983 that was managed medically presents with central chest pressure and heaviness starting at noon today. States he gets worse with exertion, better with rest. States it was radiating down his left arm earlier today. No nausea, vomiting, diaphoresis. He has been having episodes of this pain for the past 3 to 4 days, usually at night after dinner. Reports dyspepsia, feeling that he needs to belch. He's taken Advil with some relief in his pain. He also reports increasing exercise intolerance, get short of breath at about 100 feet. This is new for him in the past week. No fevers, coughing, wheezing, abdominal pain, lower extremity swelling. He does not have a history of hypertension, diabetes, CHF.  ROS as noted in HPI. All other ROS negative.  Patient is a 71 y.o. male presenting with chest pain. The history is provided by the patient. No language interpreter was used.  Chest Pain The chest pain began 3 - 5 hours ago. Chest pain occurs constantly. The chest pain is worsening. The pain is associated with breathing and exertion. The quality of the pain is described as heavy and pressure-like. The pain radiates to the left arm. Chest pain is worsened by exertion. Primary symptoms include shortness of breath and palpitations. Pertinent negatives for primary symptoms include no fever, no syncope, no cough, no wheezing, no abdominal pain, no nausea, no vomiting and no dizziness.  The palpitations also occurred with shortness of breath. The palpitations did not occur with dizziness.   Pertinent negatives for associated symptoms include no lower extremity edema. He tried NSAIDs for the  symptoms. Risk factors include male gender and being elderly.  His past medical history is significant for CAD and MI.  Pertinent negatives for past medical history include no CHF, no diabetes and no hypertension.     Past Medical History  Diagnosis Date  . MI (myocardial infarction) 1983  . Coronary artery disease   . COPD (chronic obstructive pulmonary disease)   . Traumatic amputation of right leg     History reviewed. No pertinent past surgical history.  History reviewed. No pertinent family history.  History  Substance Use Topics  . Smoking status: Former Smoker    Quit date: 11/13/1999  . Smokeless tobacco: Not on file  . Alcohol Use: No      Review of Systems  Constitutional: Negative for fever.  Respiratory: Positive for shortness of breath. Negative for cough and wheezing.   Cardiovascular: Positive for chest pain and palpitations. Negative for syncope.  Gastrointestinal: Negative for nausea, vomiting and abdominal pain.  Neurological: Negative for dizziness.    Allergies  Review of patient's allergies indicates no known allergies.  Home Medications   Current Outpatient Rx  Name Route Sig Dispense Refill  . PREDNISONE 20 MG PO TABS Oral Take 20 mg by mouth daily.      BP 153/83  Pulse 52  Temp(Src) 97.8 F (36.6 C) (Oral)  Resp 20  SpO2 99%  Physical Exam  Nursing note and vitals reviewed. Constitutional: He is oriented to person, place, and time. He appears well-developed and well-nourished.  HENT:  Head: Normocephalic and atraumatic.  Eyes: Conjunctivae and EOM are normal.  Neck: Normal  range of motion.  Cardiovascular: Regular rhythm, normal heart sounds and intact distal pulses.  Bradycardia present.   Pulmonary/Chest: Effort normal and breath sounds normal. No respiratory distress. He exhibits no tenderness.  Abdominal: Soft. Bowel sounds are normal. He exhibits no distension. There is no tenderness.  Musculoskeletal: Normal range of  motion. He exhibits no edema and no tenderness.       Right leg absent  Neurological: He is alert and oriented to person, place, and time.  Skin: Skin is warm and dry.  Psychiatric: He has a normal mood and affect. His behavior is normal.    ED Course  Procedures (including critical care time)  Labs Reviewed - No data to display No results found.   1. Chest pain    EKG: Sinus bradycardia, rate 53. Normal axis. Incomplete right bundle branch block, present in previous EKG. No hypertrophy. No ST T-wave changes compared to EKG from 2000.  MDM  Concern for cardiac ischemia. EKG shows sinus bradycardia, no ST T-wave changes compared to EKG from 2000. There are no previous records for reference. Transferring to the ER for chest pain workup    Luiz Blare, MD 11/13/11 1626  Luiz Blare, MD 11/13/11 [phone]

## 2011-11-13 NOTE — ED Provider Notes (Signed)
History     CSN: [id number]  Arrival date & time 11/13/11  1655   First MD Initiated Contact with Patient 11/13/11 1703      Chief Complaint  Patient presents with  . Chest Pain    (Consider location/radiation/quality/duration/timing/severity/associated sxs/prior treatment) Patient is a 71 y.o. male presenting with chest pain. The history is provided by the patient.  Chest Pain The chest pain began 3 - 5 hours ago (since noon). Chest pain occurs intermittently. The chest pain is unchanged. The pain is associated with stress and exertion. The severity of the pain is severe. The quality of the pain is described as aching, dull, heavy and crushing. The pain does not radiate. Chest pain is worsened by stress and exertion. Primary symptoms include shortness of breath (with exertion). Pertinent negatives for primary symptoms include no fever, no syncope, no cough, no wheezing, no palpitations, no abdominal pain, no nausea, no vomiting and no dizziness.  Pertinent negatives for associated symptoms include no diaphoresis, no numbness and no weakness. He tried nothing for the symptoms. Risk factors include male gender, lack of exercise, being elderly, smoking/tobacco exposure and sedentary lifestyle.  His past medical history is significant for CAD, hyperlipidemia, hypertension and MI.  Pertinent negatives for past medical history include no aortic aneurysm, no aortic dissection, no DVT, no seizures and no strokes.  Procedure history is positive for cardiac catheterization (in the 1980's).     Past Medical History  Diagnosis Date  . MI (myocardial infarction) 1983  . Coronary artery disease   . COPD (chronic obstructive pulmonary disease)   . Traumatic amputation of right leg     History reviewed. No pertinent past surgical history.  History reviewed. No pertinent family history.  History  Substance Use Topics  . Smoking status: Former Smoker    Quit date: 11/13/1999  . Smokeless  tobacco: Not on file  . Alcohol Use: No      Review of Systems  Constitutional: Negative for fever, chills, diaphoresis, activity change and appetite change.  HENT: Negative for neck pain.   Respiratory: Positive for shortness of breath (with exertion). Negative for cough, chest tightness and wheezing.   Cardiovascular: Positive for chest pain. Negative for palpitations, leg swelling and syncope.  Gastrointestinal: Negative for nausea, vomiting, abdominal pain, diarrhea and constipation.  Genitourinary: Negative for dysuria, frequency and flank pain.  Skin: Negative for rash and wound.  Neurological: Negative for dizziness, seizures, syncope, weakness, light-headedness, numbness and headaches.  All other systems reviewed and are negative.    Allergies  Review of patient's allergies indicates no known allergies.  Home Medications   Current Outpatient Rx  Name Route Sig Dispense Refill  . PREDNISONE 20 MG PO TABS Oral Take 20 mg by mouth daily.      BP 138/74  Pulse 58  Temp(Src) 97.8 F (36.6 C) (Oral)  Resp 17  SpO2 98%  Physical Exam  Nursing note and vitals reviewed. Constitutional: He appears well-developed and well-nourished.  HENT:  Head: Normocephalic and atraumatic.  Right Ear: External ear normal.  Left Ear: External ear normal.  Nose: Nose normal.  Mouth/Throat: Oropharynx is clear and moist. No oropharyngeal exudate.  Eyes: Conjunctivae are normal. Pupils are equal, round, and reactive to light.  Neck: Normal range of motion. Neck supple.  Cardiovascular: Normal rate, regular rhythm, normal heart sounds and intact distal pulses.   Pulmonary/Chest: Effort normal and breath sounds normal. No respiratory distress. He has no wheezes. He has no rales. He exhibits  no tenderness.  Abdominal: Soft. Bowel sounds are normal. He exhibits no distension and no mass. There is no tenderness. There is no rebound and no guarding.  Musculoskeletal: Normal range of motion. He  exhibits no edema and no tenderness.  Neurological: He is alert. He displays normal reflexes. No cranial nerve deficit. He exhibits normal muscle tone. Coordination normal.  Skin: Skin is warm and dry. No rash noted. No erythema. No pallor.  Psychiatric: He has a normal mood and affect. His behavior is normal. Judgment and thought content normal.    ED Course  Procedures (including critical care time)  Labs Reviewed  CBC - Abnormal; Notable for the following:    RBC 4.13 (*)    All other components within normal limits  BASIC METABOLIC PANEL - Abnormal; Notable for the following:    GFR calc non Af Amer 83 (*)    All other components within normal limits  CBC - Abnormal; Notable for the following:    RBC 4.05 (*)    HCT 38.9 (*)    MCH 34.1 (*)    All other components within normal limits  CREATININE, SERUM - Abnormal; Notable for the following:    GFR calc non Af Amer 81 (*)    All other components within normal limits  DIFFERENTIAL  TROPONIN I  PRO B NATRIURETIC PEPTIDE  CARDIAC PANEL(CRET KIN+CKTOT+MB+TROPI)  CARDIAC PANEL(CRET KIN+CKTOT+MB+TROPI)  CARDIAC PANEL(CRET KIN+CKTOT+MB+TROPI)  TSH  HEMOGLOBIN A1C  CBC  BASIC METABOLIC PANEL  LIPID PANEL   Dg Chest Portable 1 View  11/13/2011  *RADIOLOGY REPORT*  Clinical Data: Chest pain  PORTABLE CHEST - 1 VIEW  [facility]2010  Findings: Mild bibasilar scarring versus atelectasis.  Lungs otherwise essentially clear. No pleural effusion or pneumothorax.  Cardiomediastinal silhouette is within normal limits.  IMPRESSION: No evidence of acute cardiopulmonary disease.  Mild bibasilar scarring versus atelectasis.  Original Report Authenticated By: Charline Bills, M.D.     1. Unstable angina   2. Chest pain      Date: 11/13/2011  Rate: 53 bpm  Rhythm: sinus bradycardia  QRS Axis: normal  Intervals: normal  ST/T Wave abnormalities: normal  Conduction Disutrbances:none   Narrative Interpretation:   Old EKG Reviewed: unchanged (02/26/1999)   Date: 11/13/2011  Rate: 76  Rhythm: normal sinus rhythm  QRS Axis: normal  Intervals: normal  ST/T Wave abnormalities: normal  Conduction Disutrbances:none  Narrative Interpretation:   Old EKG Reviewed: unchanged except for increased rate (compared with EKG earlier in visit)    MDM  71 yo M w/hx of coronary artery disease (MI in 1983) and COPD presents after referral from urgent care for several days of exertional chest pain. EKG reveals sinus bradycardia but no evidence of acute ischemia or arrythmia or blocks. 325mg  ASA provided. CXR not c/w dissection, PNA, or PTX. Initial troponin negative. However, despite multiple rounds of SL NTG PTA and in ED, no improvement in chest pain; IV morphine given for pain. Suspect unstable angina; IV heparin administered. BNP ordered due to exertional dyspnea and negative. Cardiology consulted and will admit.         Clemetine Marker, MD 11/13/11 2326

## 2011-11-14 DIAGNOSIS — I200 Unstable angina: Secondary | ICD-10-CM

## 2011-11-14 LAB — LIPID PANEL
HDL: 38 mg/dL — ABNORMAL LOW (ref >39)
LDL Cholesterol: 78 mg/dL (ref 0–99)
Total CHOL/HDL Ratio: 4.3 RATIO
Triglycerides: 233 mg/dL — ABNORMAL HIGH (ref <150)
VLDL: 47 mg/dL — ABNORMAL HIGH (ref 0–40)

## 2011-11-14 LAB — CBC
HCT: 37.5 % — ABNORMAL LOW (ref 39.0–52.0)
Hemoglobin: 13.1 g/dL (ref 13.0–17.0)
MCV: 95.2 fL (ref 78.0–100.0)
RDW: 13 % (ref 11.5–15.5)
WBC: 5.9 10*3/uL (ref 4.0–10.5)

## 2011-11-14 LAB — CARDIAC PANEL(CRET KIN+CKTOT+MB+TROPI)
Relative Index: INVALID (ref 0.0–2.5)
Relative Index: INVALID (ref 0.0–2.5)
Total CK: 25 U/L (ref 7–232)
Total CK: 24 U/L (ref 7–232)
Troponin I: <0.3 ng/mL (ref <0.30)

## 2011-11-14 LAB — HEPATIC FUNCTION PANEL
ALT: 18 U/L (ref 0–53)
AST: 21 U/L (ref 0–37)
Albumin: 3.3 g/dL — ABNORMAL LOW (ref 3.5–5.2)
Bilirubin, Direct: <0.1 mg/dL (ref 0.0–0.3)

## 2011-11-14 LAB — BASIC METABOLIC PANEL
BUN: 12 mg/dL (ref 6–23)
CO2: 28 mEq/L (ref 19–32)
Chloride: 104 mEq/L (ref 96–112)
Creatinine, Ser: 0.98 mg/dL (ref 0.50–1.35)
GFR calc Af Amer: >90 mL/min (ref >90)
Glucose, Bld: 97 mg/dL (ref 70–99)

## 2011-11-14 MED ORDER — NITROGLYCERIN 0.4 MG SL SUBL: 0.4000 mg | SUBLINGUAL_TABLET | SUBLINGUAL | Status: DC | PRN

## 2011-11-14 MED ORDER — ATORVASTATIN CALCIUM 40 MG PO TABS: 40.0000 mg | ORAL_TABLET | Freq: Every day | ORAL | Status: DC

## 2011-11-14 MED ORDER — GUAIFENESIN-DM 100-10 MG/5ML PO SYRP: 5.0000 mL | ORAL_SOLUTION | Freq: Three times a day (TID) | ORAL | Status: AC | PRN

## 2011-11-14 MED ORDER — ASPIRIN 81 MG PO TBEC: 81.0000 mg | DELAYED_RELEASE_TABLET | Freq: Every day | ORAL | Status: AC

## 2011-11-14 NOTE — Discharge Summary (Signed)
Discharge Summary   Patient ID: Leslie Cardenas MRN: [id number], DOB/AGE: 1941/01/27 71 y.o. Admit date: 11/13/2011 D/C date:     11/14/2011   Primary Discharge Diagnoses:  1. Chest pain, ruled out for MI - for outpatient stress test  2. Asymptomatic bradycardia  Secondary Discharge Diagnoses:  1. CAD  - s/p MI in 1983 - initiated on statin this admission, will need f/u LFTs/lipids 6 weeks 2. HL 3. COPD 4. 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 He reported experiencing substernal chest pain radiating to his left chest occurring reliably in the evening each day for the past week. The first time it occurred he took Tums without relief. Later in the week he took Advil x 4 with relief. He endorsed associated SOB (has SOB at baseline), increased DOE, occasional palpitations, lightheadedness. He denies n/v, diaphoresis, LE edema or PND. Upon ED arrival, EKG revealed no ischemia changes. Initial troponin-I WNL. pBNP WNL. CBC and BMET unremarkable. CXR without evidence of acute cardiopulmonary disease. Mild bibasilar scarring vs atelectasis noted. He was given morphine 2mg  IV with mild improvement. On interview with cardiology, pt's HR noted to be in high 40s (but asymptomatic at that time). His CP was noted to have a pleuritic component but there was no evidence for rub on exam or change of pericarditis on EKG. He was not tachycardic, tachypnic or hypoxic so there was low suspicion for PE. He was admitted for CP rule out. CE's remained negative x 4. Lungs are clear and he is not wheezing. Because of cough, Dr. Graciela Husbands recommended OTC cough suppressant such as dextromethorphan. Dr. Graciela Husbands has seen and examined the patient today and feels he is stable for discharge with plans for an outpatient stress test.  Discharge Vitals: Blood  pressure 144/73, pulse 50, temperature 97.1 F (36.2 C), temperature source Oral, resp. rate 15, height 5\' 9"  (1.753 m), weight 155 lb (70.308 kg), SpO2 95.00%.  Labs: Lab Results  Component Value Date   WBC 5.9 11/14/2011   HGB 13.1 11/14/2011   HCT 37.5* 11/14/2011   MCV 95.2 11/14/2011   PLT 257 11/14/2011     Lab 11/14/11 0232  NA 140  K 3.7  CL 104  CO2 28  BUN 12  CREATININE 0.98  CALCIUM 9.4  PROT --  BILITOT --  ALKPHOS --  ALT --  AST --  GLUCOSE 97    Basename 11/14/11 0905 11/14/11 0230 11/13/11 2100 11/13/11 1718  CKTOTAL 24 25 22  --  CKMB 2.0 2.3 2.4 --  TROPONINI <0.30 <0.30 <0.30 <0.30   Lab Results  Component Value Date   CHOL 163 11/14/2011   HDL 38* 11/14/2011   LDLCALC 78 11/14/2011   TRIG 233* 11/14/2011    Diagnostic Studies/Procedures   1. Chest Portable 1 View 11/13/2011  *RADIOLOGY REPORT*  Clinical Data: Chest pain  PORTABLE CHEST - 1 VIEW  [facility]/2010  Findings: Mild bibasilar scarring versus atelectasis.  Lungs otherwise essentially clear. No pleural effusion or pneumothorax.  Cardiomediastinal silhouette is within normal limits.  IMPRESSION: No evidence of acute cardiopulmonary disease.  Mild bibasilar scarring versus atelectasis.  Original Report Authenticated By: Charline Bills, M.D.    Discharge Medications   Medication List  As of 11/14/2011  1:06 PM   TAKE these medications  aspirin 81 MG EC tablet   Take 1 tablet (81 mg total) by mouth daily.      atorvastatin 40 MG tablet   Commonly known as: LIPITOR   Take 1 tablet (40 mg total) by mouth at bedtime.      guaiFENesin-dextromethorphan 100-10 MG/5ML syrup   Commonly known as: ROBITUSSIN DM   Take 5 mLs by mouth 3 (three) times daily as needed for cough.      nitroGLYCERIN 0.4 MG SL tablet   Commonly known as: NITROSTAT   Place 1 tablet (0.4 mg total) under the tongue every 5 (five) minutes x 3 doses as needed for chest pain.       predniSONE 10 MG tablet   Commonly known as: DELTASONE   Take 10 mg by mouth every other day. Take on even-numbered days.            Disposition   The patient will be discharged in stable condition to home. Discharge Orders    Future Orders Please Complete By Expires   Diet - low sodium heart healthy      Increase activity slowly        Follow-up Information    Follow up with [facility]. (Our office will call you to schedule stress test and follow-up appointment)    Contact information:   [address] [place] [address] [phone]           Duration of Discharge Encounter: Greater than 30 minutes including physician and PA time.  Signed, Randal Yepiz PA-C 11/14/2011, 1:06 PM

## 2011-11-14 NOTE — Progress Notes (Signed)
  Patient Name: Leslie Cardenas   SUBJECTIVE: Patient admitted with chest pain. He has a history of remote MI in 29. There was a pleuritic component to this and was felt to be low risk. He has subsequently ruled out for myocardial infarction. An echo was ordered. He is not yet done.  He awakened this morning coughing with significant recurrence of chest pain. It remains pleuritic.   Past Medical History  Diagnosis Date  . MI (myocardial infarction) 1983  . Coronary artery disease   . COPD (chronic obstructive pulmonary disease)   . Traumatic amputation of right leg   . Hyperlipidemia     PHYSICAL EXAM Filed Vitals:   11/13/11 1936 11/13/11 2032 11/13/11 2100 11/14/11 0600  BP: 117/75 134/89 135/76 144/73  Pulse:   51 50  Temp:  97.9 F (36.6 C) 98.3 F (36.8 C) 97.1 F (36.2 C)  TempSrc:  Oral Oral   Resp: 18 18 18 15   Height:      Weight:      SpO2: 100% 99% 98% 95%   Well developed and nourished in no acute distress HENT normal Neck supple with JVP-flat Clear Regular rate and rhythm, no murmurs or gallops Abd-soft with active BS No Clubbing cyanosis edema Skin-warm and dry A & Oriented  Grossly normal sensory and motor function   TELEMETRY: Reviewed telemetry pt in sinus brayd   No intake or output data in the 24 hours ending 11/14/11 0951  LABS: Basic Metabolic Panel:  Lab 11/14/11 9604 11/13/11 2100 11/13/11 1715  NA 140 -- 137  K 3.7 -- 3.6  CL 104 -- 101  CO2 28 -- 27  GLUCOSE 97 -- 93  BUN 12 -- 11  CREATININE 0.98 0.98 0.92  CALCIUM 9.4 -- 9.5  MG -- -- --  PHOS -- -- --   Cardiac Enzymes:  Basename 11/14/11 0230 11/13/11 2100 11/13/11 1718  CKTOTAL 25 22 --  CKMB 2.3 2.4 --  CKMBINDEX -- -- --  TROPONINI <0.30 <0.30 <0.30   CBC:  Lab 11/14/11 0232 11/13/11 2100 11/13/11 1715  WBC 5.9 7.1 7.2  NEUTROABS -- -- 4.9  HGB 13.1 13.8 14.0  HCT 37.5* 38.9* 39.7  MCV 95.2 96.0 96.1  PLT 257 289 322   Hemoglobin A1C:  Basename  11/13/11 2100  HGBA1C 5.7*   Fasting Lipid Panel:  Basename 11/14/11 0232  CHOL 163  HDL 38*  LDLCALC 78  TRIG 540*  CHOLHDL 4.3  LDLDIRECT --   Thyroid Function Tests:  Basename 11/13/11 2100  TSH 2.594  T4TOTAL --  T3FREE --  THYROIDAB --      ASSESSMENT AND PLAN:  Patient Active Hospital Problem List: Chest pain (11/13/2011)  Hyperlipidemia (11/13/2011)  COPD (chronic obstructive pulmonary disease) (11/13/2011)  Sinus bradycardia (11/13/2011)   Plan will be to go home today. We will skip the echo. Outpatient Myoview. Recommend also dextromethorphan over-the-counter to his wife.   Signed, Sherryl Manges MD  11/14/2011

## 2011-11-24 ENCOUNTER — Ambulatory Visit (HOSPITAL_COMMUNITY): Payer: Medicare PPO | Attending: Cardiovascular Disease | Admitting: Radiology

## 2011-11-24 VITALS — BP 126/81 | Ht 68.5 in | Wt 144.0 lb

## 2011-11-24 DIAGNOSIS — J449 Chronic obstructive pulmonary disease, unspecified: Secondary | ICD-10-CM | POA: Insufficient documentation

## 2011-11-24 DIAGNOSIS — R079 Chest pain, unspecified: Secondary | ICD-10-CM

## 2011-11-24 DIAGNOSIS — E785 Hyperlipidemia, unspecified: Secondary | ICD-10-CM | POA: Insufficient documentation

## 2011-11-24 DIAGNOSIS — I252 Old myocardial infarction: Secondary | ICD-10-CM | POA: Insufficient documentation

## 2011-11-24 DIAGNOSIS — S88919A Complete traumatic amputation of unspecified lower leg, level unspecified, initial encounter: Secondary | ICD-10-CM | POA: Insufficient documentation

## 2011-11-24 DIAGNOSIS — Z9889 Other specified postprocedural states: Secondary | ICD-10-CM | POA: Insufficient documentation

## 2011-11-24 DIAGNOSIS — J4489 Other specified chronic obstructive pulmonary disease: Secondary | ICD-10-CM | POA: Insufficient documentation

## 2011-11-24 DIAGNOSIS — Z87891 Personal history of nicotine dependence: Secondary | ICD-10-CM | POA: Insufficient documentation

## 2011-11-24 DIAGNOSIS — R0602 Shortness of breath: Secondary | ICD-10-CM

## 2011-11-24 MED ORDER — TECHNETIUM TC 99M TETROFOSMIN IV KIT
11.0000 | PACK | Freq: Once | INTRAVENOUS | Status: AC | PRN
  Administered 2011-11-24: 11 via INTRAVENOUS

## 2011-11-24 MED ORDER — TECHNETIUM TC 99M TETROFOSMIN IV KIT
33.0000 | PACK | Freq: Once | INTRAVENOUS | Status: AC | PRN
  Administered 2011-11-24: 33 via INTRAVENOUS

## 2011-11-24 NOTE — Progress Notes (Signed)
[facility] SITE 3 NUCLEAR MED [address] [place] [address] [phone]  Cardiology Nuclear Med Study  Leslie Cardenas is a 71 y.o. male     MRN : [id number]     DOB: 1940-08-10  Procedure Date: 11/24/2011  Nuclear Med Background Indication for Stress Test:  Evaluation for Ischemia and  11/13/11 Post Hospital: Chest pain, (-) enzymes/EKG History:  COPD, 1983: MI-Heart Cath, 11/13/11: [facility] CP (-) enzymes/EKG, (R) leg amputation Cardiac Risk Factors: History of Smoking and Lipids  Symptoms:  Chest Pain and Chest Pressure.    Nuclear Pre-Procedure Caffeine/Decaff Intake:  8:00am NPO After: 8:30am   Lungs:  clear O2 Sat: 98% on room air. IV 0.9% NS with Angio Cath:  20g  IV Site: R Forearm  IV Started by:  Stanton Kidney, EMT-P  Chest Size (in):  38 Cup Size: n/a  Height: 5' 8.5" (1.74 m)  Weight:  144 lb (65.318 kg)  BMI:  Body mass index is 21.58 kg/(m^2). Tech Comments:  NA    Nuclear Med Study 1 or 2 day study: 1 day  Stress Test Type:  Stress  Reading MD: Kristeen Miss, MD  Order Authorizing Provider:  T.Brackbill MD/ S.Klein  Resting Radionuclide: Technetium 30m Tetrofosmin  Resting Radionuclide Dose: 10.6 mCi   Stress Radionuclide:  Technetium 62m Tetrofosmin  Stress Radionuclide Dose: 31.9 mCi           Stress Protocol Rest HR: 54 Stress HR: 131  Rest BP: 126/81 Stress BP: 189/90  Exercise Time (min): 7:46 METS: 9.70   Predicted Max HR: 149 bpm % Max HR: 87.92 bpm Rate Pressure Product: 28413   Dose of Adenosine (mg):  n/a Dose of Lexiscan: n/a mg  Dose of Atropine (mg): n/a Dose of Dobutamine: n/a mcg/kg/min (at max HR)  Stress Test Technologist: Milana Na, EMT-P  Nuclear Technologist:  Domenic Polite, CNMT     Rest Procedure:  Myocardial perfusion imaging was performed at rest 45 minutes following the intravenous administration of Technetium 4m Tetrofosmin. Rest ECG: Sinus Bradycardia  Stress Procedure:  The patient performed  treadmill exercise using a Bruce  Protocol for 7:46 minutes. The patient stopped due to sob, fatigue and denied any chest pain.  There were non specific ST-T wave changes and occ pacs/rare pvcs.  Technetium 54m Tetrofosmin was injected at peak exercise and myocardial perfusion imaging was performed after a brief delay. Stress ECG: No significant change from baseline ECG  QPS Raw Data Images:  Normal; no motion artifact; normal heart/lung ratio. Stress Images:  Normal homogeneous uptake in all areas of the myocardium. Rest Images:  Normal homogeneous uptake in all areas of the myocardium. Subtraction (SDS):  No evidence of ischemia. Transient Ischemic Dilatation (Normal <1.22):  0.82 Lung/Heart Ratio (Normal <0.45):  0.28  Quantitative Gated Spect Images QGS EDV:  64 ml QGS ESV:  18 ml  Impression Exercise Capacity:  Good exercise capacity. BP Response:  Normal blood pressure response. Clinical Symptoms:  Mild chest pain/dyspnea. ECG Impression:  No significant ST segment change suggestive of ischemia. Comparison with Prior Nuclear Study: No images to compare  Overall Impression:  Normal stress nuclear study.  No evidence of ischemia.  Normal LV function.  LV Ejection Fraction: 72%.  LV Wall Motion:  NL LV Function; NL Wall Motion    Vesta Mixer, Montez Hageman., MD, [facility] 11/24/2011, 4:30 PM Office - [phone] Pager [phone]

## 2012-02-29 ENCOUNTER — Encounter: Payer: Self-pay | Admitting: Internal Medicine

## 2012-03-30 ENCOUNTER — Encounter: Payer: Self-pay | Admitting: Internal Medicine

## 2012-03-30 ENCOUNTER — Ambulatory Visit (INDEPENDENT_AMBULATORY_CARE_PROVIDER_SITE_OTHER): Payer: Medicare PPO | Admitting: Internal Medicine

## 2012-03-30 VITALS — BP 130/70 | HR 52 | Ht 68.0 in | Wt 149.2 lb

## 2012-03-30 DIAGNOSIS — Z86010 Personal history of colonic polyps: Secondary | ICD-10-CM | POA: Insufficient documentation

## 2012-03-30 DIAGNOSIS — R1310 Dysphagia, unspecified: Secondary | ICD-10-CM

## 2012-03-30 NOTE — Progress Notes (Signed)
  Subjective:    Patient ID: Leslie Cardenas, male    DOB: May 14, 1941, 71 y.o.   MRN: [id number]  HPI This elderly wm presents with 3-4 months of increasing solid food dysphagia. Had rare intermittent problems x years but lately at least several episodes a week with waiting for food to pass and lower sternal sticking sensation. No weight loss but weight fluctuates. Rare heartburn. Not on GERD tx. GI ROS otherwise negative.  Medications, allergies, past medical history, past surgical history, family history and social history are reviewed and updated in the EMR.  Review of Systems + skin rash - uses prednisone intermittent dosing, recent URI sxs after influenza vaccine, stable DOE with COPD All other ROS negative    Objective:   Physical Exam General:  NAD Eyes:   anicteric Lungs:  clear Heart:  S1S2 no rubs, murmurs or gallops Abdomen:  soft and nontender, BS+ Ext:   no edema    Data Reviewed:  02/2012 CBC, BMET,LFT's, TSH, vit D ok Hgb A1c is 6%       Assessment & Plan:   1. Dysphagia  Ambulatory referral to Gastroenterology   1. Solid dysphagia w/o weight loss. Peptic stricture, motility (less likely), cancer (less likely) and possibly infection on prednisone are causes. 2. Plan for EGD and dilation. The risks and benefits as well as alternatives of endoscopic procedure(s) have been discussed and reviewed. All questions answered. The patient agrees to proceed.  I appreciate the opportunity to care for this patient.  WU:[id number],NWGNFAO DAVID, MD

## 2012-03-30 NOTE — Patient Instructions (Addendum)
We have set you up for an ENDO Dilation upstairs on our 4th floor of this building OCT. 31st at 1:30pm.  Please follow the instructions you have been given today.  You have been given a Dysphagia handout today.  Read and follow diet level # 4 until we see you for your procedure.  Thank you for choosing me and [facility].  Iva Boop, M.D., [facility]

## 2012-04-07 ENCOUNTER — Encounter: Payer: Self-pay | Admitting: Internal Medicine

## 2012-04-07 ENCOUNTER — Ambulatory Visit (AMBULATORY_SURGERY_CENTER): Payer: Medicare PPO | Admitting: Internal Medicine

## 2012-04-07 VITALS — BP 137/96 | HR 55 | Temp 96.9°F | Resp 22 | Ht 68.0 in | Wt 149.0 lb

## 2012-04-07 DIAGNOSIS — K222 Esophageal obstruction: Secondary | ICD-10-CM

## 2012-04-07 DIAGNOSIS — K449 Diaphragmatic hernia without obstruction or gangrene: Secondary | ICD-10-CM

## 2012-04-07 DIAGNOSIS — R1310 Dysphagia, unspecified: Secondary | ICD-10-CM

## 2012-04-07 MED ORDER — LANSOPRAZOLE 30 MG PO CPDR: 30.0000 mg | DELAYED_RELEASE_CAPSULE | Freq: Every day | ORAL | Status: DC

## 2012-04-07 MED ORDER — SODIUM CHLORIDE 0.9 % IV SOLN: 500.0000 mL | INTRAVENOUS | Status: DC

## 2012-04-07 NOTE — Progress Notes (Signed)
1344 a/ox3, pleased with MAC, report to [employer]

## 2012-04-07 NOTE — Progress Notes (Addendum)
Patient did not have preoperative order for IV antibiotic SSI prophylaxis. (G8918)Patient did not have preoperative order for IV antibiotic SSI prophylaxis. (G8918)Patient did not experience any of the following events: a burn prior to discharge; a fall within the facility; wrong site/side/patient/procedure/implant event; or a hospital transfer or hospital admission upon discharge from the facility. (G8907) 

## 2012-04-07 NOTE — Patient Instructions (Addendum)
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 You also have a hiatal hernia. In these cases we think acid reflux ("GERD") is the problem so I prescribed a medication called lansoprazole (generic Prevacid) for you to take. I hope it is not too expensive, if it is - call me and we can see about other treatment.  Please follow the instructions and return to me if this does not fix your swallowing. You should stay on an acid blocking medicine "PPI" like lansoprazole forever to prevent this from happening again.  Thank you for choosing me and [facility].  Iva Boop, MD, FACG  YOU HAD AN ENDOSCOPIC PROCEDURE TODAY AT THE [facility]: Refer to the procedure report that was given to you for any specific questions about what was found during the examination.  If the procedure report does not answer your questions, please call your gastroenterologist to clarify.  If you requested that your care partner not be given the details of your procedure findings, then the procedure report has been included in a sealed envelope for you to review at your convenience later.   DIET: Your first meal following the procedure should be a light meal and then it is ok to progress to your normal diet.  A half-sandwich or bowl of soup is an example of a good first meal.  Heavy or fried foods are harder to digest and may make you feel nauseous or bloated.  Likewise meals heavy in dairy and vegetables can cause extra gas to form and this can also increase the bloating.  Drink plenty of fluids but you should avoid alcoholic beverages for 24 hours.  ACTIVITY: Your care partner should take you home directly after the procedure.  You should plan to take it easy, moving slowly for the rest of the day.  You can resume normal activity the day after the procedure however you should NOT DRIVE or use heavy machinery for 24 hours (because of the sedation medicines used  during the test).    SYMPTOMS TO REPORT IMMEDIATELY: A gastroenterologist can be reached at any hour.  During normal business hours, 8:30 AM to 5:00 PM Monday through Friday, call [phone].  After hours and on weekends, please call the GI answering service at [phone] who will take a message and have the physician on call contact you.   FOLLOW UP: If any biopsies were taken you will be contacted by phone or by letter within the next 1-3 weeks.  Call your gastroenterologist if you have not heard about the biopsies in 3 weeks.  Our staff will call the home number listed on your records the next business day following your procedure to check on you and address any questions or concerns that you may have at that time regarding the information given to you following your procedure. This is a courtesy call and so if there is no answer at the home number and we have not heard from you through the emergency physician on call, we will assume that you have returned to your regular daily activities without incident.  SIGNATURES/CONFIDENTIALITY: You and/or your care partner have signed paperwork which will be entered into your electronic medical record.  These signatures attest to the fact that that the information above on your After Visit Summary has been reviewed and is understood.  Full responsibility of the confidentiality of this discharge information lies with you and/or your care-partner.

## 2012-04-07 NOTE — Op Note (Signed)
[facility] [address] [place], [address]   ENDOSCOPY PROCEDURE REPORT  PATIENT: Leslie Cardenas, Leslie Cardenas  MR#: [id number] BIRTHDATE: 1940-09-17 , 71  yrs. old GENDER: Male ENDOSCOPIST: Iva Boop, MD, [facility] PROCEDURE DATE:  04/07/2012 PROCEDURE:  EGD with balloon dilation <34mm ASA CLASS:     Class III INDICATIONS:  dysphagia. MEDICATIONS: propofol (Diprivan) 150mg  IV, MAC sedation, administered by CRNA, and These medications were titrated to patient response per physician's verbal order TOPICAL ANESTHETIC: none  DESCRIPTION OF PROCEDURE: After the risks benefits and alternatives of the procedure were thoroughly explained, informed consent was obtained.  The LB GIF-H180 [id number] endoscope was introduced through the mouth and advanced to the second portion of the duodenum. Without limitations.  The instrument was slowly withdrawn as the mucosa was fully examined.      ESOPHAGUS: A stricture was found at the gastroesophageal junction. Ring-like. Dilated successfully with 15-18 mm balloon. Small amount of heme.  The stenosis was traversable with the endoscope.   A 5 cm hiatal hernia was noted.  The remainder of the upper endoscopy exam was otherwise normal. Retroflexed views revealed no abnormalities.     The scope was then withdrawn from the patient and the procedure completed.  COMPLICATIONS: There were no complications. ENDOSCOPIC IMPRESSION: 1.   Stricture was found at the gastroesophageal junction - dilated to 18 mm 2.   5 cm hiatal hernia 3.   The remainder of the upper endoscopy exam was otherwise normal  RECOMMENDATIONS: 1.  Clear liquids until 3 PM , then soft foods rest iof day.  Resume prior diet tomorrow. 2.  PPI qam - lansoprazole 30 mg prescribed - stay on forever to prevent recurrent stricture formation    eSigned:  Iva Boop, MD, [facility] 04/07/2012 1:50 PM [id number] Oneta Rack, MD and The Patient

## 2012-04-08 ENCOUNTER — Telehealth: Payer: Self-pay

## 2012-04-08 NOTE — Telephone Encounter (Signed)
  Follow up Call-  Call back number 04/07/2012  Post procedure Call Back phone  # [phone]  Permission to leave phone message Yes     Patient questions:  Do you have a fever, pain , or abdominal swelling? no Pain Score  0 *  Have you tolerated food without any problems? yes  Have you been able to return to your normal activities? yes  Do you have any questions about your discharge instructions: Diet   no Medications  no Follow up visit  no  Do you have questions or concerns about your Care? no  Actions: * If pain score is 4 or above: No action needed, pain <4.

## 2012-08-22 ENCOUNTER — Ambulatory Visit (HOSPITAL_COMMUNITY)
Admission: RE | Admit: 2012-08-22 | Discharge: 2012-08-22 | Disposition: A | Discharge status: Home / Self Care | Payer: Medicare PPO | Source: Ambulatory Visit | Attending: Internal Medicine | Admitting: Internal Medicine

## 2012-08-22 ENCOUNTER — Other Ambulatory Visit (HOSPITAL_COMMUNITY): Payer: Self-pay | Admitting: Internal Medicine

## 2012-08-22 DIAGNOSIS — R059 Cough, unspecified: Secondary | ICD-10-CM | POA: Insufficient documentation

## 2012-08-22 DIAGNOSIS — J449 Chronic obstructive pulmonary disease, unspecified: Secondary | ICD-10-CM

## 2012-08-22 DIAGNOSIS — J4489 Other specified chronic obstructive pulmonary disease: Secondary | ICD-10-CM | POA: Insufficient documentation

## 2012-08-22 DIAGNOSIS — R062 Wheezing: Secondary | ICD-10-CM | POA: Insufficient documentation

## 2012-09-26 ENCOUNTER — Encounter: Payer: Self-pay | Admitting: *Deleted

## 2012-10-17 ENCOUNTER — Encounter: Payer: Self-pay | Admitting: Internal Medicine

## 2012-10-17 ENCOUNTER — Ambulatory Visit (INDEPENDENT_AMBULATORY_CARE_PROVIDER_SITE_OTHER): Payer: Medicare PPO | Admitting: Internal Medicine

## 2012-10-17 VITALS — BP 122/70 | HR 79 | Temp 98.0°F | Ht 70.0 in | Wt 152.0 lb

## 2012-10-17 DIAGNOSIS — J209 Acute bronchitis, unspecified: Secondary | ICD-10-CM

## 2012-10-17 DIAGNOSIS — R0982 Postnasal drip: Secondary | ICD-10-CM

## 2012-10-17 DIAGNOSIS — R0989 Other specified symptoms and signs involving the circulatory and respiratory systems: Secondary | ICD-10-CM

## 2012-10-17 DIAGNOSIS — R0600 Dyspnea, unspecified: Secondary | ICD-10-CM

## 2012-10-17 MED ORDER — FLUTICASONE PROPIONATE 50 MCG/ACT NA SUSP: 2.0000 | Freq: Every day | NASAL | Status: DC

## 2012-10-17 MED ORDER — DOXYCYCLINE HYCLATE 100 MG PO TABS: 100.0000 mg | ORAL_TABLET | Freq: Two times a day (BID) | ORAL | Status: DC

## 2012-10-17 NOTE — Progress Notes (Signed)
Subjective:    Patient ID: Leslie Cardenas, male    DOB: 11-10-1940, 72 y.o.   MRN: [id number]  IOV 10/17/2012 PCP MCKEOWN,WILLIAM DAVID, MD   HPI  IOV 10/17/2012  72 year old male with prior 86 pakl smoker. C/o insidious onset of dyspnea x few years. Progressive esp in 2-3 months. Dyspnea at time is severe. Dyspnea always brought on by exertion. Dyspnea always releived by rest. Dyspnea brought on by "Walking aggressive"  Or "lifting things" or 1 flight of steps but not for changing clothes. There is associated orthopnea and pnd x 2-3 months and wheezing x 3 months. Dyspnea is asosciated with cough that is mild but constant hacking cough with daily white sputum that is atleast 1 teaspoon in a day.  HE is currently on some sample inhaler presumably spiriva daily x 6 weeks with only marginal improvement. He is also on scheduled alt day prednisone x 1 year presumably for "Breathing"   However, today 10/17/12 he is having some yellow sputum with some increase in volume of phlegm today. Also rattling chest noise today.   Denies fever, hemoptysis, weight loss,      CAT COPD Symptom & Quality of Life Score (GSK trademark) 0 is no burden. 5 is highest burden 10/17/2012   Never Cough -> Cough all the time 5  No phlegm in chest -> Chest is full of phlegm 4  No chest tightness -> Chest feels very tight 4  No dyspnea for 1 flight stairs/hill -> Very dyspneic for 1 flight of stairs 5  No limitations for ADL at home -> Very limited with ADL at home 0  Confident leaving home -> Not at all confident leaving home 0  Sleep soundly -> Do not sleep soundly because of lung condition 5  Lots of Energy -> No energy at all 2  TOTAL Score (max 40)  23       Dyspnea relevant hx Body mass index is 21.81 kg/(m^2).  reports that he quit smoking about 12 years ago. His smoking use included Cigarettes. He has a 60 pack-year smoking history. He has never used smokeless tobacco.  - s/p MI several decades ago.   Per Hx cardiac stress test spring 2014 - normal but review of chart this is dated 11/14/2011 and shows ef 72% and normal  - CXR 08/22/12 - copd with no infiltrates  - denies having had PFT   Past Medical History  Diagnosis Date  . MI (myocardial infarction) 1983  . Coronary artery disease   . COPD (chronic obstructive pulmonary disease)   . Traumatic amputation of right leg   . Hyperlipidemia   . Vitamin D deficiency   . Diverticulosis   . Adenomatous polyps 2006 and 2010     Family History  Problem Relation Age of Onset  . Hypertension Father   . Pancreatic cancer Mother   . Colon cancer Brother   . Leukemia Brother   . Prostate cancer Brother   . Other Brother     polio     History   Social History  . Marital Status: Divorced    Spouse Name: N/A    Number of Children: 4  . Years of Education: N/A   Occupational History  . retired    Social History Main Topics  . Smoking status: Former Smoker -- 3.00 packs/day for 20 years    Types: Cigarettes    Quit date: 04/11/2000  . Smokeless tobacco: Never Used  . Alcohol Use: No  .  Drug Use: No  . Sexually Active: Not on file   Other Topics Concern  . Not on file   Social History Narrative   Lives in [place], [place].      No Known Allergies   Outpatient Prescriptions Prior to Visit  Medication Sig Dispense Refill  . aspirin EC 81 MG EC tablet Take 1 tablet (81 mg total) by mouth daily.      Marland Kitchen atorvastatin (LIPITOR) 40 MG tablet Take 1 tablet (40 mg total) by mouth at bedtime.  30 tablet  6  . nitroGLYCERIN (NITROSTAT) 0.4 MG SL tablet Place 1 tablet (0.4 mg total) under the tongue every 5 (five) minutes x 3 doses as needed for chest pain.  25 tablet  4  . predniSONE (DELTASONE) 10 MG tablet Take 10 mg by mouth every other day. Take on even-numbered days.      . lansoprazole (PREVACID) 30 MG capsule Take 1 capsule (30 mg total) by mouth daily.  30 capsule  11   No facility-administered medications prior to  visit.       Review of Systems  Constitutional: Negative for fever and unexpected weight change.  HENT: Positive for congestion. Negative for ear pain, nosebleeds, sore throat, rhinorrhea, sneezing, trouble swallowing, dental problem, postnasal drip and sinus pressure.   Eyes: Negative for redness and itching.  Respiratory: Positive for cough and shortness of breath. Negative for chest tightness and wheezing.   Cardiovascular: Negative for palpitations and leg swelling.  Gastrointestinal: Negative for nausea and vomiting.  Genitourinary: Negative for dysuria.  Musculoskeletal: Negative for joint swelling.  Skin: Negative for rash.  Neurological: Negative for headaches.  Hematological: Does not bruise/bleed easily.  Psychiatric/Behavioral: Negative for dysphoric mood. The patient is not nervous/anxious.        Objective:   Physical Exam  Nursing note and vitals reviewed. Constitutional: He is oriented to person, place, and time. He appears well-developed and well-nourished. No distress.  HENT:  Head: Normocephalic and atraumatic.  Right Ear: External ear normal.  Left Ear: External ear normal.  Mouth/Throat: Oropharynx is clear and moist. No oropharyngeal exudate.  Eyes: Conjunctivae and EOM are normal. Pupils are equal, round, and reactive to light. Right eye exhibits no discharge. Left eye exhibits no discharge. No scleral icterus.  Neck: Normal range of motion. Neck supple. No JVD present. No tracheal deviation present. No thyromegaly present.  Cardiovascular: Normal rate, regular rhythm and intact distal pulses.  Exam reveals no gallop and no friction rub.   No murmur heard. Pulmonary/Chest: Effort normal. No respiratory distress. He has no wheezes. He has rales. He exhibits no tenderness.  Abdominal: Soft. Bowel sounds are normal. He exhibits no distension and no mass. There is no tenderness. There is no rebound and no guarding.  Musculoskeletal: Normal range of motion. He  exhibits no edema and no tenderness.  Lymphadenopathy:    He has no cervical adenopathy.  Neurological: He is alert and oriented to person, place, and time. He has normal reflexes. No cranial nerve deficit. Coordination normal.  Skin: Skin is warm and dry. No rash noted. He is not diaphoretic. No erythema. No pallor.  Psychiatric: He has a normal mood and affect. His behavior is normal. Judgment and thought content normal.          Assessment & Plan:

## 2012-10-17 NOTE — Patient Instructions (Addendum)
#  Mild acute bronchitis - Take doxycycline 100mg  po twice daily x 5 days; take after meals and avoid sunlight  #Postnasal drip  take generic fluticasone inhaler 2 squirts each nostril daily   #Shortness of breath and cough - Likely due to COPD with severity undetermined - Please have full pulmonary function tests in the next few to several days - Please do overnight oxygen test when you sleeping  #Followup - Return in few weeks after completing pulmonary function test and oxygen test - Come sooner or call sooner if there are problems

## 2012-10-17 NOTE — Assessment & Plan Note (Signed)
#  Postnasal drip  take generic fluticasone inhaler 2 squirts each nostril daily

## 2012-10-17 NOTE — Assessment & Plan Note (Signed)
Mild acute bronchitis - Take doxycycline 100mg  po twice daily x 5 days; take after meals and avoid sunlight

## 2012-10-17 NOTE — Assessment & Plan Note (Signed)
#  Shortness of breath and cough - Likely due to COPD with severity undetermined - Please have full pulmonary function tests in the next few to several days - Please do overnight oxygen test when you sleeping  #Followup - Return in few weeks after completing pulmonary function test and oxygen test - Come sooner or call sooner if there are problem

## 2012-10-20 ENCOUNTER — Ambulatory Visit (HOSPITAL_COMMUNITY)
Admission: RE | Admit: 2012-10-20 | Discharge: 2012-10-20 | Disposition: A | Discharge status: Home / Self Care | Payer: Medicare PPO | Source: Ambulatory Visit | Attending: Internal Medicine | Admitting: Internal Medicine

## 2012-10-20 DIAGNOSIS — R0989 Other specified symptoms and signs involving the circulatory and respiratory systems: Secondary | ICD-10-CM | POA: Insufficient documentation

## 2012-10-20 DIAGNOSIS — R062 Wheezing: Secondary | ICD-10-CM | POA: Insufficient documentation

## 2012-10-20 DIAGNOSIS — R05 Cough: Secondary | ICD-10-CM | POA: Insufficient documentation

## 2012-10-20 DIAGNOSIS — R059 Cough, unspecified: Secondary | ICD-10-CM | POA: Insufficient documentation

## 2012-10-20 DIAGNOSIS — J988 Other specified respiratory disorders: Secondary | ICD-10-CM | POA: Insufficient documentation

## 2012-10-20 DIAGNOSIS — R0609 Other forms of dyspnea: Secondary | ICD-10-CM | POA: Insufficient documentation

## 2012-10-20 DIAGNOSIS — R0600 Dyspnea, unspecified: Secondary | ICD-10-CM

## 2012-10-20 DIAGNOSIS — R942 Abnormal results of pulmonary function studies: Secondary | ICD-10-CM | POA: Insufficient documentation

## 2012-10-20 LAB — PULMONARY FUNCTION TEST

## 2012-10-20 MED ORDER — ALBUTEROL SULFATE (5 MG/ML) 0.5% IN NEBU
2.5000 mg | INHALATION_SOLUTION | Freq: Once | RESPIRATORY_TRACT | Status: AC
Start: 2012-10-20 — End: 2012-10-20
  Administered 2012-10-20: 2.5 mg via RESPIRATORY_TRACT

## 2012-10-31 ENCOUNTER — Telehealth: Payer: Self-pay | Admitting: Internal Medicine

## 2012-10-31 NOTE — Telephone Encounter (Signed)
ono [phone] shows < 89% for but < 88% for 

## 2012-10-31 NOTE — Telephone Encounter (Signed)
pft 10/20/12 shows gold stge 3 copd - post bd fev1 15L/48%, R 46, DLCO 45%  I am seeing him 11/21/12 to discuss results   Dr. Kalman Shan, M.D., [facility].C.P Pulmonary and Critical Care Medicine Staff Physician [facility] Pager: [phone], If no answer or between  15:00h - 7:00h: call [phone]  10/31/2012 11:27 AM

## 2012-11-18 ENCOUNTER — Encounter: Payer: Self-pay | Admitting: Internal Medicine

## 2012-11-21 ENCOUNTER — Ambulatory Visit: Payer: Medicare PPO | Admitting: Internal Medicine

## 2012-12-15 ENCOUNTER — Ambulatory Visit (INDEPENDENT_AMBULATORY_CARE_PROVIDER_SITE_OTHER): Payer: Medicare PPO | Admitting: Internal Medicine

## 2012-12-15 ENCOUNTER — Encounter: Payer: Self-pay | Admitting: Internal Medicine

## 2012-12-15 VITALS — BP 118/87 | HR 54 | Temp 98.1°F | Ht 70.0 in | Wt 150.6 lb

## 2012-12-15 DIAGNOSIS — J449 Chronic obstructive pulmonary disease, unspecified: Secondary | ICD-10-CM

## 2012-12-15 MED ORDER — ACLIDINIUM BROMIDE 400 MCG/ACT IN AEPB: 1.0000 | INHALATION_SPRAY | Freq: Two times a day (BID) | RESPIRATORY_TRACT | Status: DC

## 2012-12-15 MED ORDER — BUDESONIDE-FORMOTEROL FUMARATE 80-4.5 MCG/ACT IN AERO: 2.0000 | INHALATION_SPRAY | Freq: Two times a day (BID) | RESPIRATORY_TRACT | Status: DC

## 2012-12-15 MED ORDER — PREDNISONE 10 MG PO TABS: ORAL_TABLET | ORAL | Status: DC

## 2012-12-15 NOTE — Patient Instructions (Addendum)
YOu are wheezinag a bit today Please take prednisone 40 mg x1 day, then 30 mg x1 day, then 20 mg x1 day, then 10 mg x1 day, and then 5 mg x1 day and stop STart symbicort 80/4.5 2 puff twice daily Continue Tudorza 1 puff twice daily REturn in 1 months  - at followup do walk desat test and CAT score

## 2012-12-15 NOTE — Progress Notes (Signed)
Subjective:    Patient ID: Leslie Cardenas, male    DOB: 1940/06/26, 72 y.o.   MRN: [id number]  HPI   IOV 10/17/2012 PCP MCKEOWN,WILLIAM DAVID, MD   HPI  IOV 10/17/2012  72 year old male with prior 26 pakl smoker. C/o insidious onset of dyspnea x few years. Progressive esp in 2-3 months. Dyspnea at time is severe. Dyspnea always brought on by exertion. Dyspnea always releived by rest. Dyspnea brought on by "Walking aggressive"  Or "lifting things" or 1 flight of steps but not for changing clothes. There is associated orthopnea and pnd x 2-3 months and wheezing x 3 months. Dyspnea is asosciated with cough that is mild but constant hacking cough with daily white sputum that is atleast 1 teaspoon in a day.  HE is currently on some sample inhaler presumably spiriva daily (later found out as tudorza) x 6 weeks with only marginal improvement. He is also on scheduled alt day prednisone x 1 year presumably for "Breathing"   reports that he quit smoking about 12 years ago. His smoking use included Cigarettes. He has a 60 pack-year smoking history. He has never used smokeless tobacco.  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 Also rattling chest noise today.   Denies fever, hemoptysis, weight loss,     Dyspnea relevant hx Body mass index is 21.81 kg/(m^2).  reports that he quit smoking about 12 years ago. His smoking use included Cigarettes. He has a 60 pack-year smoking history. He has never used smokeless tobacco.  - s/p MI several decades ago.  Per Hx cardiac stress test spring 2014 - normal but review of chart this is dated 11/14/2011 and shows ef 72% and normal  - CXR 08/22/12 - copd with no infiltrates  - denies having had PFT  REC #Mild acute bronchitis - Take doxycycline 100mg  po twice daily x 5 days; take after meals and avoid sunlight  #Postnasal drip  take generic  fluticasone inhaler 2 squirts each nostril daily   #Shortness of breath and cough - Likely due to COPD with severity undetermined - Please have full pulmonary function tests in the next few to several days - Please do overnight oxygen test when you sleeping  #Followup - Return in few weeks after completing pulmonary function test and oxygen test - Come sooner or call sooner if there are proble   OV 12/15/2012  REsult review. FEeling better. Actively  managing farm. COPD CAT score dropped to 10 but still has on and off wheeze. Does not have time for rehab. Wants to feel even better  ono [id number] shows < 89% for but < 88% for pft 10/20/12 shows gold stge 3 copd - post bd fev1 15L/48%, R 46, DLCO 45% (done whiule on tudorza)      CAT COPD Symptom & Quality of Life Score (GSK trademark) 0 is no burden. 5 is highest burden 10/17/2012  12/15/2012 On tudoraza and s/p doxy  Never Cough -> Cough all the time 5 2  No phlegm in chest -> Chest is full of phlegm 4 2  No chest tightness -> Chest feels very tight 4 0  No dyspnea for 1 flight stairs/hill -> Very dyspneic for 1 flight of stairs 5 4  No limitations for ADL at home -> Very limited with ADL at home 0 0  Confident leaving home -> Not  at all confident leaving home 0 0  Sleep soundly -> Do not sleep soundly because of lung condition 5 2  Lots of Energy -> No energy at all 2 0  TOTAL Score (max 40)  23 10  Body mass index is 21.61 kg/(m^2).   Body mass index is 21.61 kg/(m^2).      Past, Family, Social reviewed: no change since last visit  Review of Systems  Constitutional: Negative for fever and unexpected weight change.  HENT: Negative for ear pain, nosebleeds, congestion, sore throat, rhinorrhea, sneezing, trouble swallowing, dental problem, postnasal drip and sinus pressure.   Eyes: Negative for redness and itching.  Respiratory: Negative for cough, chest tightness, shortness of breath and wheezing.    Cardiovascular: Negative for palpitations and leg swelling.  Gastrointestinal: Negative for nausea and vomiting.  Genitourinary: Negative for dysuria.  Musculoskeletal: Negative for joint swelling.  Skin: Negative for rash.  Neurological: Negative for headaches.  Hematological: Does not bruise/bleed easily.  Psychiatric/Behavioral: Negative for dysphoric mood. The patient is not nervous/anxious.    Current outpatient prescriptions:Aclidinium Bromide (TUDORZA PRESSAIR) 400 MCG/ACT AEPB, Inhale 1 puff into the lungs daily., Disp: , Rfl: ;  atorvastatin (LIPITOR) 40 MG tablet, Take 1 tablet (40 mg total) by mouth at bedtime., Disp: 30 tablet, Rfl: 6;  fluticasone (FLONASE) 50 MCG/ACT nasal spray, Place 2 sprays into the nose daily., Disp: 16 g, Rfl: 2 lansoprazole (PREVACID) 30 MG capsule, Take 1 capsule (30 mg total) by mouth daily., Disp: 30 capsule, Rfl: 11;  nitroGLYCERIN (NITROSTAT) 0.4 MG SL tablet, Place 1 tablet (0.4 mg total) under the tongue every 5 (five) minutes x 3 doses as needed for chest pain., Disp: 25 tablet, Rfl: 4;  predniSONE (DELTASONE) 10 MG tablet, Take 10 mg by mouth every other day. Take on even-numbered days., Disp: , Rfl:  PROAIR HFA 108 (90 BASE) MCG/ACT inhaler, Inhale 2 puffs into the lungs every 4 (four) hours as needed., Disp: , Rfl:       Objective:   Physical Exam  Nursing note and vitals reviewed. Constitutional: He is oriented to person, place, and time. He appears well-developed and well-nourished. No distress.  HENT:  Head: Normocephalic and atraumatic.  Right Ear: External ear normal.  Left Ear: External ear normal.  Mouth/Throat: Oropharynx is clear and moist. No oropharyngeal exudate.  Eyes: Conjunctivae and EOM are normal. Pupils are equal, round, and reactive to light. Right eye exhibits no discharge. Left eye exhibits no discharge. No scleral icterus.  Neck: Normal range of motion. Neck supple. No JVD present. No tracheal deviation present. No  thyromegaly present.  Cardiovascular: Normal rate, regular rhythm and intact distal pulses.  Exam reveals no gallop and no friction rub.   No murmur heard. Pulmonary/Chest: Effort normal. No respiratory distress. He has wheezes. He has no rales. He exhibits no tenderness.  Abdominal: Soft. Bowel sounds are normal. He exhibits no distension and no mass. There is no tenderness. There is no rebound and no guarding.  Musculoskeletal: Normal range of motion. He exhibits no edema and no tenderness.  Lymphadenopathy:    He has no cervical adenopathy.  Neurological: He is alert and oriented to person, place, and time. He has normal reflexes. No cranial nerve deficit. Coordination normal.  Skin: Skin is warm and dry. No rash noted. He is not diaphoretic. No erythema. No pallor.  Psychiatric: He has a normal mood and affect. His behavior is normal. Judgment and thought content normal.  Assessment & Plan:

## 2012-12-19 NOTE — Assessment & Plan Note (Signed)
YOu are wheezinag a bit today Please take prednisone 40 mg x1 day, then 30 mg x1 day, then 20 mg x1 day, then 10 mg x1 day, and then 5 mg x1 day and stop STart symbicort 80/4.5 2 puff twice daily Continue Tudorza 1 puff twice daily REturn in 1 months  - at followup do walk desat test and CAT score 

## 2013-02-01 ENCOUNTER — Ambulatory Visit: Payer: Medicare PPO | Admitting: Internal Medicine

## 2013-02-20 ENCOUNTER — Ambulatory Visit: Payer: Medicare PPO | Admitting: Internal Medicine

## 2013-04-19 ENCOUNTER — Encounter: Payer: Self-pay | Admitting: Internal Medicine

## 2013-04-20 ENCOUNTER — Ambulatory Visit: Payer: Medicare PPO | Admitting: Emergency Medicine

## 2013-04-20 ENCOUNTER — Encounter: Payer: Self-pay | Admitting: Emergency Medicine

## 2013-04-20 VITALS — BP 120/82 | HR 60 | Temp 98.2°F | Resp 18 | Ht 69.0 in | Wt 154.0 lb

## 2013-04-20 DIAGNOSIS — J309 Allergic rhinitis, unspecified: Secondary | ICD-10-CM

## 2013-04-20 DIAGNOSIS — R7309 Other abnormal glucose: Secondary | ICD-10-CM

## 2013-04-20 DIAGNOSIS — E782 Mixed hyperlipidemia: Secondary | ICD-10-CM

## 2013-04-20 DIAGNOSIS — I10 Essential (primary) hypertension: Secondary | ICD-10-CM

## 2013-04-20 DIAGNOSIS — E559 Vitamin D deficiency, unspecified: Secondary | ICD-10-CM

## 2013-04-20 DIAGNOSIS — J441 Chronic obstructive pulmonary disease with (acute) exacerbation: Secondary | ICD-10-CM

## 2013-04-20 LAB — BASIC METABOLIC PANEL WITH GFR
Chloride: 102 mEq/L (ref 96–112)
GFR, Est African American: >89 mL/min
GFR, Est Non African American: 85 mL/min
Glucose, Bld: 104 mg/dL — ABNORMAL HIGH (ref 70–99)
Potassium: 4.4 mEq/L (ref 3.5–5.3)
Sodium: 138 mEq/L (ref 135–145)

## 2013-04-20 LAB — HEPATIC FUNCTION PANEL
AST: 22 U/L (ref 0–37)
Bilirubin, Direct: 0.1 mg/dL (ref 0.0–0.3)
Indirect Bilirubin: 0.3 mg/dL (ref 0.0–0.9)
Total Protein: 6.7 g/dL (ref 6.0–8.3)

## 2013-04-20 LAB — CBC WITH DIFFERENTIAL/PLATELET
Basophils Absolute: 0.1 10*3/uL (ref 0.0–0.1)
Basophils Relative: 1 % (ref 0–1)
Eosinophils Relative: 3 % (ref 0–5)
Hemoglobin: 14.8 g/dL (ref 13.0–17.0)
Lymphocytes Relative: 8 % — ABNORMAL LOW (ref 12–46)
Lymphs Abs: 0.7 10*3/uL (ref 0.7–4.0)
MCHC: 34.7 g/dL (ref 30.0–36.0)
Monocytes Relative: 7 % (ref 3–12)
Neutro Abs: 6.5 10*3/uL (ref 1.7–7.7)
Neutrophils Relative %: 81 % — ABNORMAL HIGH (ref 43–77)
WBC: 8 10*3/uL (ref 4.0–10.5)

## 2013-04-20 LAB — LIPID PANEL
Cholesterol: 210 mg/dL — ABNORMAL HIGH (ref 0–200)
HDL: 39 mg/dL — ABNORMAL LOW (ref >39)
LDL Cholesterol: 129 mg/dL — ABNORMAL HIGH (ref 0–99)
Total CHOL/HDL Ratio: 5.4 Ratio
Triglycerides: 210 mg/dL — ABNORMAL HIGH (ref <150)
VLDL: 42 mg/dL — ABNORMAL HIGH (ref 0–40)

## 2013-04-20 MED ORDER — AZITHROMYCIN 250 MG PO TABS: ORAL_TABLET | ORAL | Status: AC

## 2013-04-20 MED ORDER — PREDNISONE 10 MG PO TABS: ORAL_TABLET | ORAL | Status: DC

## 2013-04-20 MED ORDER — BENZONATATE 100 MG PO CAPS: 100.0000 mg | ORAL_CAPSULE | Freq: Three times a day (TID) | ORAL | Status: DC | PRN

## 2013-04-20 MED ORDER — MONTELUKAST SODIUM 10 MG PO TABS: 10.0000 mg | ORAL_TABLET | Freq: Every day | ORAL | Status: DC

## 2013-04-20 NOTE — Progress Notes (Signed)
Subjective:    Patient ID: Leslie Cardenas, male    DOB: 1941-03-09, 72 y.o.   MRN: [id number]  HPI Comments: 72 yo WM presents for 3 month F/U for HTN, Cholesterol, Pre-Dm, D. Deficient has been doing well overall. He notes he keeps active with work. He is eating healthy EXCEPT FOR SWEETS. BP good at home. He has noticed increased yellow sputum with cough and lung congestion x couple of weeks. He has noticed some tightness in chest but has not been using Albuterol AD. LAST LABS T 165 TG 300 H 38 LDL 67 A1C 5.7   Hypertension  Hyperlipidemia    Current Outpatient Prescriptions on File Prior to Visit  Medication Sig Dispense Refill  . budesonide-formoterol (SYMBICORT) 80-4.5 MCG/ACT inhaler Inhale 2 puffs into the lungs 2 (two) times daily.  1 Inhaler  12  . fluticasone (FLONASE) 50 MCG/ACT nasal spray Place 2 sprays into the nose daily.  16 g  2  . gabapentin (NEURONTIN) 300 MG capsule Take 300 mg by mouth daily.      . lansoprazole (PREVACID) 30 MG capsule Take 1 capsule (30 mg total) by mouth daily.  30 capsule  11  . predniSONE (DELTASONE) 10 MG tablet Take 10 mg by mouth daily with breakfast. 1.5 PILLS QOD      . PROAIR HFA 108 (90 BASE) MCG/ACT inhaler Inhale 2 puffs into the lungs every 4 (four) hours as needed.      Marland Kitchen atorvastatin (LIPITOR) 40 MG tablet Take 1 tablet (40 mg total) by mouth at bedtime.  30 tablet  6  . nitroGLYCERIN (NITROSTAT) 0.4 MG SL tablet Place 1 tablet (0.4 mg total) under the tongue every 5 (five) minutes x 3 doses as needed for chest pain.  25 tablet  4   No current facility-administered medications on file prior to visit.    Review of patient's allergies indicates no known allergies.  Past Medical History  Diagnosis Date  . MI (myocardial infarction) 1983  . Coronary artery disease   . COPD (chronic obstructive pulmonary disease)   . Traumatic amputation of right leg   . Hyperlipidemia   . Vitamin D deficiency   . Diverticulosis   . Adenomatous  polyps 2006 and 2010     Review of Systems  HENT: Positive for postnasal drip.   Respiratory: Positive for cough and chest tightness.   All other systems reviewed and are negative.       Objective:   Physical Exam  Nursing note and vitals reviewed. Constitutional: He is oriented to person, place, and time. He appears well-developed and well-nourished.  HENT:  Head: Normocephalic and atraumatic.  Right Ear: External ear normal.  Left Ear: External ear normal.  Nose: Nose normal.  Mouth/Throat: Oropharynx is clear and moist.  Posterior pharynx with cobblestones TM's yellow exudate  Eyes: Pupils are equal, round, and reactive to light.  Neck: Normal range of motion. Neck supple.  Cardiovascular: Normal rate, regular rhythm, normal heart sounds and intact distal pulses.   Pulmonary/Chest: Effort normal. He has wheezes.  Abdominal: Soft.  Musculoskeletal: Normal range of motion.  Lymphadenopathy:    He has no cervical adenopathy.  Neurological: He is alert and oriented to person, place, and time.  Skin: Skin is warm and dry.  Psychiatric: He has a normal mood and affect. Judgment normal.          Assessment & Plan:  1. 3 month F/U for HTN, Cholesterol, Pre-Dm, D. Deficient check labs. Decrease sweets  increase cardio. 2. Probable COPD exacerabation hold current Prednisone and start PRED 10 mg DP AD, Zpak, Tessalon perles 100mg , Singulair 10 mg Take all AD, increase H2O. W/C if no change for CXR

## 2013-04-20 NOTE — Patient Instructions (Signed)
Hypercholesterolemia High Blood Cholesterol Cholesterol is a white, waxy, fat-like protein needed by your body in small amounts. The liver makes all the cholesterol you need. It is carried from the liver by the blood through the blood vessels. Deposits (plaque) may build up on blood vessel walls. This makes the arteries narrower and stiffer. Plaque increases the risk for heart attack and stroke. You cannot feel your cholesterol level even if it is very high. The only way to know is by a blood test to check your lipid (fats) levels. Once you know your cholesterol levels, you should keep a record of the test results. Work with your caregiver to to keep your levels in the desired range. WHAT THE RESULTS MEAN:  Total cholesterol is a rough measure of all the cholesterol in your blood.  LDL is the so-called bad cholesterol. This is the type that deposits cholesterol in the walls of the arteries. You want this level to be low.  HDL is the good cholesterol because it cleans the arteries and carries the LDL away. You want this level to be high.  Triglycerides are fat that the body can either burn for energy or store. High levels are closely linked to heart disease. DESIRED LEVELS:  Total cholesterol below 200.  LDL below 100 for people at risk, below 70 for very high risk.  HDL above 50 is good, above 60 is best.  Triglycerides below 150. HOW TO LOWER YOUR CHOLESTEROL:  Diet.  Choose fish or white meat chicken and [place], roasted or baked. Limit fatty cuts of red meat, fried foods, and processed meats, such as sausage and lunch meat.  Eat lots of fresh fruits and vegetables. Choose whole grains, beans, pasta, potatoes and cereals.  Use only small amounts of olive, corn or canola oils. Avoid butter, mayonnaise, shortening or palm kernel oils. Avoid foods with trans-fats.  Use skim/nonfat milk and low-fat/nonfat yogurt and cheeses. Avoid whole milk, cream, ice cream, egg yolks and cheeses.  Healthy desserts include angel food cake, gingersnaps, animal crackers, hard candy, popsicles, and low-fat/nonfat frozen yogurt. Avoid pastries, cakes, pies and cookies.  Exercise.  A regular program helps decrease LDL and raises HDL.  Helps with weight control.  Do things that increase your activity level like gardening, walking, or taking the stairs.  Medication.  May be prescribed by your caregiver to help lowering cholesterol and the risk for heart disease.  You may need medicine even if your levels are normal if you have several risk factors. HOME CARE INSTRUCTIONS   Follow your diet and exercise programs as suggested by your caregiver.  Take medications as directed.  Have blood work done when your caregiver feels it is necessary. MAKE SURE YOU:   Understand these instructions.  Will watch your condition.  Will get help right away if you are not doing well or get worse. Document Released: 05/25/2005 Document Revised: 08/17/2011 Document Reviewed: 11/10/2006 [facility] Patient Information 2014 [place], [place]. Chronic Obstructive Pulmonary Disease Chronic obstructive pulmonary disease (COPD) is a lung disease. The lungs become damaged. This makes it hard to get air in and out of your lungs. The damage to your lungs cannot be changed. There are things you can do to improve the lungs and make you feel better. HOME CARE  Take all medicines as told by your doctor.  Use medicines that you breathe in (inhale) as told by your doctor.  Avoid medicines or cough syrups that dry up your airway (antihistamines) and do not allow you  to get rid of thick spit.  If you smoke, stop.  Avoid being around smoke, chemicals, and fumes that bother your breathing.  Avoid people that have a catchy (contagious) sickness.  Avoid going outside when it is very hot, cold, or humid.  Use humidifiers in your home and near your bedside if it helps your breathing.  Drink enough fluids to keep your  pee (urine) clear or pale yellow.  Eat healthy foods. Eat smaller meals more often and rest before meals.  Ask your doctor if it is okay to take vitamins or pills with minerals (supplements).  Exercise and stay active.  Rest with activity.  Get into a comfortable position when you have trouble breathing.  Learn and use tips on how to relax.  Learn and use tips on how to control your breathing as told by your doctor. Try:  Breathing in through your nose for 1 second. Then, breath out (exhale) through your puckered (like a whistle) lips for 2 seconds.  Putting one hand on your belly (abdomen). Breathe in slowly through your nose. Your hand on your belly should move out. Breathe out slowly through your puckered lips. Your hand on your belly should move in as you breathe out.  Learn and use controlled coughing to clear thick spit from your lungs. 1. Lean your head slightly forward. 2. Breathe in deeply. 3. Try to hold your breath for 3 seconds. 4. Keep your mouth slightly open while coughing 2 times. 5. Spit any thick spit out into a tissue. 6. Rest and do the steps again 1 or 2 times as needed.  Get all shots (vaccines) a told by your doctor.  Learn how to manage stress.  Schedule and go to all follow-up doctor visits.  Go to therapy that can help you improve your lungs (pulmonary rehabilitation) as told by your doctor.  Use oxygen at home as told by your doctor. GET HELP RIGHT AWAY IF:   You can feel your heart beating really fast.  You have shortness of breath while resting.  You have shortness of breath that stops you from being able to talk.  You have shortness of breath that stops you from doing normal activities.  You have chest pain lasting longer than 5 minutes.  You start to shake uncontrollably (seizure).  Your family or friends notice that you are flustered or confused.  You cough up more thick spit than usual.  There is a change in the color or  thickness of the spit.  Breathing is more difficult than usual.  Your breathing is faster than usual.  Your skin color is more blueish than usual.  You are running out of the medicine you take for breathing.  You are anxious, uneasy, fearful, or restless.  You have a fever. MAKE SURE YOU:   Understand these instructions.  Will watch your condition.  Will get help right away if you are not doing well or get worse. Document Released: 11/11/2007 Document Revised: 05/11/2012 Document Reviewed: 01/19/2013 [facility] Patient Information 2014 [place], [place].

## 2013-07-05 ENCOUNTER — Ambulatory Visit (HOSPITAL_COMMUNITY)
Admission: RE | Admit: 2013-07-05 | Discharge: 2013-07-05 | Disposition: A | Discharge status: Home / Self Care | Payer: Medicare PPO | Source: Ambulatory Visit | Attending: Internal Medicine | Admitting: Internal Medicine

## 2013-07-05 ENCOUNTER — Encounter: Payer: Self-pay | Admitting: Internal Medicine

## 2013-07-05 ENCOUNTER — Ambulatory Visit (INDEPENDENT_AMBULATORY_CARE_PROVIDER_SITE_OTHER): Payer: Medicare PPO | Admitting: Internal Medicine

## 2013-07-05 VITALS — BP 126/78 | HR 60 | Temp 97.9°F | Resp 16

## 2013-07-05 DIAGNOSIS — J441 Chronic obstructive pulmonary disease with (acute) exacerbation: Secondary | ICD-10-CM

## 2013-07-05 DIAGNOSIS — R7309 Other abnormal glucose: Secondary | ICD-10-CM

## 2013-07-05 DIAGNOSIS — I10 Essential (primary) hypertension: Secondary | ICD-10-CM

## 2013-07-05 DIAGNOSIS — Z79899 Other long term (current) drug therapy: Secondary | ICD-10-CM

## 2013-07-05 DIAGNOSIS — Z7901 Long term (current) use of anticoagulants: Secondary | ICD-10-CM

## 2013-07-05 DIAGNOSIS — Z125 Encounter for screening for malignant neoplasm of prostate: Secondary | ICD-10-CM

## 2013-07-05 DIAGNOSIS — R7303 Prediabetes: Secondary | ICD-10-CM | POA: Insufficient documentation

## 2013-07-05 DIAGNOSIS — E559 Vitamin D deficiency, unspecified: Secondary | ICD-10-CM | POA: Insufficient documentation

## 2013-07-05 DIAGNOSIS — Z87891 Personal history of nicotine dependence: Secondary | ICD-10-CM | POA: Insufficient documentation

## 2013-07-05 DIAGNOSIS — J449 Chronic obstructive pulmonary disease, unspecified: Secondary | ICD-10-CM | POA: Insufficient documentation

## 2013-07-05 DIAGNOSIS — E785 Hyperlipidemia, unspecified: Secondary | ICD-10-CM

## 2013-07-05 DIAGNOSIS — J4489 Other specified chronic obstructive pulmonary disease: Secondary | ICD-10-CM | POA: Insufficient documentation

## 2013-07-05 DIAGNOSIS — Z1212 Encounter for screening for malignant neoplasm of rectum: Secondary | ICD-10-CM

## 2013-07-05 DIAGNOSIS — Z0000 Encounter for general adult medical examination without abnormal findings: Secondary | ICD-10-CM

## 2013-07-05 LAB — CBC WITH DIFFERENTIAL/PLATELET
BASOS PCT: 1 % (ref 0–1)
Basophils Absolute: 0 10*3/uL (ref 0.0–0.1)
Eosinophils Absolute: 0 10*3/uL (ref 0.0–0.7)
Eosinophils Relative: 0 % (ref 0–5)
HCT: 42.2 % (ref 39.0–52.0)
Hemoglobin: 14.7 g/dL (ref 13.0–17.0)
Lymphocytes Relative: 8 % — ABNORMAL LOW (ref 12–46)
Lymphs Abs: 0.6 10*3/uL — ABNORMAL LOW (ref 0.7–4.0)
MCH: 33.1 pg (ref 26.0–34.0)
MCHC: 34.8 g/dL (ref 30.0–36.0)
MCV: 95 fL (ref 78.0–100.0)
Monocytes Absolute: 0.2 10*3/uL (ref 0.1–1.0)
Monocytes Relative: 3 % (ref 3–12)
NEUTROS ABS: 6.5 10*3/uL (ref 1.7–7.7)
NEUTROS PCT: 88 % — AB (ref 43–77)
Platelets: 313 10*3/uL (ref 150–400)
RBC: 4.44 MIL/uL (ref 4.22–5.81)
RDW: 13.7 % (ref 11.5–15.5)
WBC: 7.4 10*3/uL (ref 4.0–10.5)

## 2013-07-05 MED ORDER — PREDNISONE 10 MG PO TABS: ORAL_TABLET | ORAL | Status: DC

## 2013-07-05 NOTE — Patient Instructions (Addendum)
HyperteChronic Obstructive Pulmonary Disease Chronic obstructive pulmonary disease (COPD) is a common lung condition in which airflow from the lungs is limited. COPD is a general term that can be used to describe many different lung problems that limit airflow, including both chronic bronchitis and emphysema. If you have COPD, your lung function will probably never return to normal, but there are measures you can take to improve lung function and make yourself feel better.  CAUSES   Smoking (common).   Exposure to secondhand smoke.   Genetic problems.  Chronic inflammatory lung diseases or recurrent infections. SYMPTOMS   Shortness of breath, especially with physical activity.   Deep, persistent (chronic) cough with a large amount of thick mucus.   Wheezing.   Rapid breaths (tachypnea).   Gray or bluish discoloration (cyanosis) of the skin, especially in fingers, toes, or lips.   Fatigue.   Weight loss.   Frequent infections or episodes when breathing symptoms become much worse (exacerbations).   Chest tightness. DIAGNOSIS  Your healthcare provider will take a medical history and perform a physical examination to make the initial diagnosis. Additional tests for COPD may include:   Lung (pulmonary) function tests.  Chest X-ray.  CT scan.  Blood tests. TREATMENT  Treatment available to help you feel better when you have COPD include:   Inhaler and nebulizer medicines. These help manage the symptoms of COPD and make your breathing more comfortable  Supplemental oxygen. Supplemental oxygen is only helpful if you have a low oxygen level in your blood.   Exercise and physical activity. These are beneficial for nearly all people with COPD. Some people may also benefit from a pulmonary rehabilitation program. HOME CARE INSTRUCTIONS   Take all medicines (inhaled or pills) as directed by your health care provider.  Only take over-the-counter or  prescription medicines for pain, fever, or discomfort as directed by your health care provider.   Avoid over-the-counter medicines or cough syrups that dry up your airway (such as antihistamines) and slow down the elimination of secretions unless instructed otherwise by your healthcare provider.   If you are a smoker, the most important thing that you can do is stop smoking. Continuing to smoke will cause further lung damage and breathing trouble. Ask your health care provider for help with quitting smoking. He or she can direct you to community resources or hospitals that provide support.  Avoid exposure to irritants such as smoke, chemicals, and fumes that aggravate your breathing.  Use oxygen therapy and pulmonary rehabilitation if directed by your health care provider. If you require home oxygen therapy, ask your healthcare provider whether you should purchase a pulse oximeter to measure your oxygen level at home.   Avoid contact with individuals who have a contagious illness.  Avoid extreme temperature and humidity changes.  Eat healthy foods. Eating smaller, more frequent meals and resting before meals may help you maintain your strength.  Stay active, but balance activity with periods of rest. Exercise and physical activity will help you maintain your ability to do things you want to do.  Preventing infection and hospitalization is very important when you have COPD. Make sure to receive all the vaccines your health care provider recommends, especially the pneumococcal and influenza vaccines. Ask your healthcare provider whether you need a pneumonia vaccine.  Learn and use relaxation techniques to manage stress.  Learn and use controlled breathing techniques as directed by your health care provider. Controlled breathing techniques include:   Pursed lip breathing. Start  by breathing in (inhaling) through your nose for 1 second. Then, purse your lips as if you were going to whistle  and breathe out (exhale) through the pursed lips for 2 seconds.   Diaphragmatic breathing. Start by putting one hand on your abdomen just above your waist. Inhale slowly through your nose. The hand on your abdomen should move out. Then purse your lips and exhale slowly. You should be able to feel the hand on your abdomen moving in as you exhale.   Learn and use controlled coughing to clear mucus from your lungs. Controlled coughing is a series of short, progressive coughs. The steps of controlled coughing are:  1. Lean your head slightly forward.  2. Breathe in deeply using diaphragmatic breathing.  3. Try to hold your breath for 3 seconds.  4. Keep your mouth slightly open while coughing twice.  5. Spit any mucus out into a tissue.  6. Rest and repeat the steps once or twice as needed. SEEK MEDICAL CARE IF:   You are coughing up more mucus than usual.   There is a change in the color or thickness of your mucus.   Your breathing is more labored than usual.   Your breathing is faster than usual.  SEEK IMMEDIATE MEDICAL CARE IF:   You have shortness of breath while you are resting.   You have shortness of breath that prevents you from:  Being able to talk.   Performing your usual physical activities.   You have chest pain lasting longer than 5 minutes.   Your skin color is more cyanotic than usual.  You measure low oxygen saturations for longer than 5 minutes with a pulse oximeter. MAKE SURE YOU:   Understand these instructions.  Will watch your condition.  Will get help right away if you are not doing well or get worse. Document Released: 03/04/2005 Document Revised: 03/15/2013 Document Reviewed: 01/19/2013 [facility] Patient Information 2014 [employer], [place]. nsion As your heart beats, it forces blood through your arteries. This force is your blood pressure. If the pressure is too high, it is called hypertension (HTN) or high blood pressure. HTN is dangerous  because you may have it and not know it. High blood pressure may mean that your heart has to work harder to pump blood. Your arteries may be narrow or stiff. The extra work puts you at risk for heart disease, stroke, and other problems.  Blood pressure consists of two numbers, a higher number over a lower, 110/72, for example. It is stated as "110 over 72." The ideal is below 120 for the top number (systolic) and under 80 for the bottom (diastolic). Write down your blood pressure today. You should pay close attention to your blood pressure if you have certain conditions such as:  Heart failure.  Prior heart attack.  Diabetes  Chronic kidney disease.  Prior stroke.  Multiple risk factors for heart disease. To see if you have HTN, your blood pressure should be measured while you are seated with your arm held at the level of the heart. It should be measured at least twice. A one-time elevated blood pressure reading (especially in the Emergency Department) does not mean that you need treatment. There may be conditions in which the blood pressure is different between your right and left arms. It is important to see your caregiver soon for a recheck. Most people have essential hypertension which means that there is not a specific cause. This type of high blood pressure may be  lowered by changing lifestyle factors such as:  Stress.  Smoking.  Lack of exercise.  Excessive weight.  Drug/tobacco/alcohol use.  Eating less salt. Most people do not have symptoms from high blood pressure until it has caused damage to the body. Effective treatment can often prevent, delay or reduce that damage. TREATMENT  When a cause has been identified, treatment for high blood pressure is directed at the cause. There are a large number of medications to treat HTN. These fall into several categories, and your caregiver will help you select the medicines that are best for you. Medications may have side effects. You  should review side effects with your caregiver. If your blood pressure stays high after you have made lifestyle changes or started on medicines,   Your medication(s) may need to be changed.  Other problems may need to be addressed.  Be certain you understand your prescriptions, and know how and when to take your medicine.  Be sure to follow up with your caregiver within the time frame advised (usually within two weeks) to have your blood pressure rechecked and to review your medications.  If you are taking more than one medicine to lower your blood pressure, make sure you know how and at what times they should be taken. Taking two medicines at the same time can result in blood pressure that is too low. SEEK IMMEDIATE MEDICAL CARE IF:  You develop a severe headache, blurred or changing vision, or confusion.  You have unusual weakness or numbness, or a faint feeling.  You have severe chest or abdominal pain, vomiting, or breathing problems. MAKE SURE YOU:   Understand these instructions.  Will watch your condition.  Will get help right away if you are not doing well or get worse.   Diabetes and Exercise Exercising regularly is important. It is not just about losing weight. It has many health benefits, such as:  Improving your overall fitness, flexibility, and endurance.  Increasing your bone density.  Helping with weight control.  Decreasing your body fat.  Increasing your muscle strength.  Reducing stress and tension.  Improving your overall health. People with diabetes who exercise gain additional benefits because exercise:  Reduces appetite.  Improves the body's use of blood sugar (glucose).  Helps lower or control blood glucose.  Decreases blood pressure.  Helps control blood lipids (such as cholesterol and triglycerides).  Improves the body's use of the hormone insulin by:  Increasing the body's insulin sensitivity.  Reducing the body's insulin  needs.  Decreases the risk for heart disease because exercising:  Lowers cholesterol and triglycerides levels.  Increases the levels of good cholesterol (such as high-density lipoproteins [HDL]) in the body.  Lowers blood glucose levels. YOUR ACTIVITY PLAN  Choose an activity that you enjoy and set realistic goals. Your health care provider or diabetes educator can help you make an activity plan that works for you. You can break activities into 2 or 3 sessions throughout the day. Doing so is as good as one long session. Exercise ideas include:  Taking the dog for a walk.  Taking the stairs instead of the elevator.  Dancing to your favorite song.  Doing your favorite exercise with a friend. RECOMMENDATIONS FOR EXERCISING WITH TYPE 1 OR TYPE 2 DIABETES   Check your blood glucose before exercising. If blood glucose levels are greater than 240 mg/dL, check for urine ketones. Do not exercise if ketones are present.  Avoid injecting insulin into areas of the body that are going  to be exercised. For example, avoid injecting insulin into:  The arms when playing tennis.  The legs when jogging.  Keep a record of:  Food intake before and after you exercise.  Expected peak times of insulin action.  Blood glucose levels before and after you exercise.  The type and amount of exercise you have done.  Review your records with your health care provider. Your health care provider will help you to develop guidelines for adjusting food intake and insulin amounts before and after exercising.  If you take insulin or oral hypoglycemic agents, watch for signs and symptoms of hypoglycemia. They include:  Dizziness.  Shaking.  Sweating.  Chills.  Confusion.  Drink plenty of water while you exercise to prevent dehydration or heat stroke. Body water is lost during exercise and must be replaced.  Talk to your health care provider before starting an exercise program to make sure it is safe  for you. Remember, almost any type of activity is better than none.    Cholesterol Cholesterol is a white, waxy, fat-like protein needed by your body in small amounts. The liver makes all the cholesterol you need. It is carried from the liver by the blood through the blood vessels. Deposits (plaque) may build up on blood vessel walls. This makes the arteries narrower and stiffer. Plaque increases the risk for heart attack and stroke. You cannot feel your cholesterol level even if it is very high. The only way to know is by a blood test to check your lipid (fats) levels. Once you know your cholesterol levels, you should keep a record of the test results. Work with your caregiver to to keep your levels in the desired range. WHAT THE RESULTS MEAN:  Total cholesterol is a rough measure of all the cholesterol in your blood.  LDL is the so-called bad cholesterol. This is the type that deposits cholesterol in the walls of the arteries. You want this level to be low.  HDL is the good cholesterol because it cleans the arteries and carries the LDL away. You want this level to be high.  Triglycerides are fat that the body can either burn for energy or store. High levels are closely linked to heart disease. DESIRED LEVELS:  Total cholesterol below 200.  LDL below 100 for people at risk, below 70 for very high risk.  HDL above 50 is good, above 60 is best.  Triglycerides below 150. HOW TO LOWER YOUR CHOLESTEROL:  Diet.  Choose fish or white meat chicken and [place], roasted or baked. Limit fatty cuts of red meat, fried foods, and processed meats, such as sausage and lunch meat.  Eat lots of fresh fruits and vegetables. Choose whole grains, beans, pasta, potatoes and cereals.  Use only small amounts of olive, corn or canola oils. Avoid butter, mayonnaise, shortening or palm kernel oils. Avoid foods with trans-fats.  Use skim/nonfat milk and low-fat/nonfat yogurt and cheeses. Avoid whole milk,  cream, ice cream, egg yolks and cheeses. Healthy desserts include angel food cake, ginger snaps, animal crackers, hard candy, popsicles, and low-fat/nonfat frozen yogurt. Avoid pastries, cakes, pies and cookies.  Exercise.  A regular program helps decrease LDL and raises HDL.  Helps with weight control.  Do things that increase your activity level like gardening, walking, or taking the stairs.  Medication.  May be prescribed by your caregiver to help lowering cholesterol and the risk for heart disease.  You may need medicine even if your levels are normal if you have  several risk factors. HOME CARE INSTRUCTIONS  7. Follow your diet and exercise programs as suggested by your caregiver. 8. Take medications as directed. 9. Have blood work done when your caregiver feels it is necessary. MAKE SURE YOU:   Understand these instructions.  Will watch your condition.  Will get help right away if you are not doing well or get worse.      Vitamin D Deficiency Vitamin D is an important vitamin that your body needs. Having too little of it in your body is called a deficiency. A very bad deficiency can make your bones soft and can cause a condition called rickets.  Vitamin D is important to your body for different reasons, such as:   It helps your body absorb 2 minerals called calcium and phosphorus.  It helps make your bones healthy.  It may prevent some diseases, such as diabetes and multiple sclerosis.  It helps your muscles and heart. You can get vitamin D in several ways. It is a natural part of some foods. The vitamin is also added to some dairy products and cereals. Some people take vitamin D supplements. Also, your body makes vitamin D when you are in the sun. It changes the sun's rays into a form of the vitamin that your body can use. CAUSES   Not eating enough foods that contain vitamin D.  Not getting enough sunlight.  Having certain digestive system diseases that make it  hard to absorb vitamin D. These diseases include Crohn's disease, chronic pancreatitis, and cystic fibrosis.  Having a surgery in which part of the stomach or small intestine is removed.  Being obese. Fat cells pull vitamin D out of your blood. That means that obese people may not have enough vitamin D left in their blood and in other body tissues.  Having chronic kidney or liver disease. RISK FACTORS Risk factors are things that make you more likely to develop a vitamin D deficiency. They include:  Being older.  Not being able to get outside very much.  Living in a nursing home.  Having had broken bones.  Having weak or thin bones (osteoporosis).  Having a disease or condition that changes how your body absorbs vitamin D.  Having dark skin.  Some medicines such as seizure medicines or steroids.  Being overweight or obese. SYMPTOMS Mild cases of vitamin D deficiency may not have any symptoms. If you have a very bad case, symptoms may include:  Bone pain.  Muscle pain.  Falling often.  Broken bones caused by a minor injury, due to osteoporosis. DIAGNOSIS A blood test is the best way to tell if you have a vitamin D deficiency. TREATMENT Vitamin D deficiency can be treated in different ways. Treatment for vitamin D deficiency depends on what is causing it. Options include:  Taking vitamin D supplements.  Taking a calcium supplement. Your caregiver will suggest what dose is best for you. HOME CARE INSTRUCTIONS  Take any supplements that your caregiver prescribes. Follow the directions carefully. Take only the suggested amount.  Have your blood tested 2 months after you start taking supplements.  Eat foods that contain vitamin D. Healthy choices include:  Fortified dairy products, cereals, or juices. Fortified means vitamin D has been added to the food. Check the label on the package to be sure.  Fatty fish like salmon or trout.  Eggs.  Oysters.  Do not use a  tanning bed.  Keep your weight at a healthy level. Lose weight if you   need to.  Keep all follow-up appointments. Your caregiver will need to perform blood tests to make sure your vitamin D deficiency is going away. SEEK MEDICAL CARE IF:  You have any questions about your treatment.  You continue to have symptoms of vitamin D deficiency.  You have nausea or vomiting.  You are constipated.  You feel confused.  You have severe abdominal or back pain. MAKE SURE YOU:  Understand these instructions.  Will watch your condition.  Will get help right away if you are not doing well or get worse.

## 2013-07-05 NOTE — Progress Notes (Signed)
Patient ID: Leslie Cardenas, male   DOB: 07-Feb-1941, 73 y.o.   MRN: [id number]  Annual Screening Comprehensive Examination  This very nice 73 y.o.  MWM presents for complete physical.  Patient has been followed for HTN, ASHD/MI,   Prediabetes, Hyperlipidemia, and Vitamin D Deficiency.   HTN and ASHD/MI predates since 60. Patient's BP has been controlled at home. He had a negative stress Lexiscan in 2013 by Dr Cathie Olden. Today's BP: 126/78 mmHg. Patient denies any cardiac symptoms as chest pain, palpitations, shortness of breath, dizziness or ankle swelling.   Patient's hyperlipidemia is controlled with diet and medications. Patient denies myalgias or other medication SE's. Last cholesterol last visit was 210, triglycerides 210, HDL 39 and LDL 129 in Nov 2014   Patient has prediabetes A1c 5.9% in Jan 2011  with last A1c again 5.9% in Nov 2014. Patient denies reactive hypoglycemic symptoms, visual blurring, diabetic polys, or paresthesias.    Patient has COPD with DOE, denies cough of congestion . He does continue to smoke 3--4 cigarettes per day.   Finally, patient has history of Vitamin D Deficiency of 24 in 2008 with last vitamin D of 47 in July 2014.       Medication List       atorvastatin 40 MG tablet  Commonly known as:  LIPITOR  Take 1 tablet (40 mg total) by mouth at bedtime.     benzonatate 100 MG capsule  Commonly known as:  TESSALON PERLES  Take 1 capsule (100 mg total) by mouth 3 (three) times daily as needed for cough.     budesonide-formoterol 80-4.5 MCG/ACT inhaler  Commonly known as:  SYMBICORT  Inhale 2 puffs into the lungs 2 (two) times daily.     fluticasone 50 MCG/ACT nasal spray  Commonly known as:  FLONASE  Place 2 sprays into the nose daily.     gabapentin 300 MG capsule  Commonly known as:  NEURONTIN  Take 300 mg by mouth daily.     lansoprazole 30 MG capsule  Commonly known as:  PREVACID  Take 1 capsule (30 mg total) by mouth daily.     montelukast 10  MG tablet  Commonly known as:  SINGULAIR  Take 1 tablet (10 mg total) by mouth daily.     nitroGLYCERIN 0.4 MG SL tablet  Commonly known as:  NITROSTAT  Place 1 tablet (0.4 mg total) under the tongue every 5 (five) minutes x 3 doses as needed for chest pain.     predniSONE 10 MG tablet  Commonly known as:  DELTASONE  1 to 2 tablets daily as directed     PROAIR HFA 108 (90 BASE) MCG/ACT inhaler  Generic drug:  albuterol  Inhale 2 puffs into the lungs every 4 (four) hours as needed.        No Known Allergies  Past Medical History  Diagnosis Date  . MI (myocardial infarction) 1983  . Coronary artery disease   . COPD (chronic obstructive pulmonary disease)   . Traumatic amputation of right leg   . Hyperlipidemia   . Vitamin D deficiency   . Diverticulosis   . Adenomatous polyps 2006 and 2010  . Unspecified essential hypertension 07/05/2013    Past Surgical History  Procedure Laterality Date  . Right leg amputation    . Cardiac catheterization  1983    No intervention performed.   . Colonoscopy w/ polypectomy  08/12/2010    Dr. Silvano Rusk  . Inguinal hernia repair  left  . Esophagogastroduodenoscopy    . Shoulder arthroscopy Left 2000    Family History  Problem Relation Age of Onset  . Hypertension Father   . Pancreatic cancer Mother   . Colon cancer Brother   . Leukemia Brother   . Prostate cancer Brother   . Other Brother     polio    History   Social History  . Marital Status: Divorced    Spouse Name: N/A    Number of Children: 32  . Years of Education: N/A   Occupational History  . retired    Social History Main Topics  . Smoking status: Former Smoker -- 3.00 packs/day for 20 years    Types: Cigarettes    Quit date: 04/11/2000  . Smokeless tobacco: Never Used  . Alcohol Use: No  . Drug Use: No  . Sexual Activity: Not on file   Other Topics Concern  . Not on file   Social History Narrative   Lives in [place], [place].      ROS Constitutional: Denies fever, chills, weight loss/gain, headaches, insomnia, fatigue, night sweats, and change in appetite. Eyes: Denies redness, blurred vision, diplopia, discharge, itchy, watery eyes.  ENT: Denies discharge, congestion, post nasal drip, epistaxis, sore throat, earache, hearing loss, dental pain, Tinnitus, Vertigo, Sinus pain, snoring.  Cardio: Denies chest pain, palpitations, irregular heartbeat, syncope, dyspnea, diaphoresis, orthopnea, PND, claudication, edema Respiratory: denies cough, dyspnea, DOE, pleurisy, hoarseness, laryngitis, wheezing.  Gastrointestinal: Denies dysphagia, heartburn, reflux, water brash, pain, cramps, nausea, vomiting, bloating, diarrhea, constipation, hematemesis, melena, hematochezia, jaundice, hemorrhoids Genitourinary: Denies dysuria, frequency, urgency, nocturia, hesitancy, discharge, hematuria, flank pain Musculoskeletal: Denies arthralgia, myalgia, stiffness, Jt. Swelling, pain, limp, and strain/sprain. Skin: Denies puritis, rash, hives, warts, acne, eczema, changing in skin lesion Neuro: No weakness, tremor, incoordination, spasms, paresthesia, pain Psychiatric: Denies confusion, memory loss, sensory loss Endocrine: Denies change in weight, skin, hair change, nocturia, and paresthesia, diabetic polys, visual blurring, hyper / hypo glycemic episodes.  Heme/Lymph: No excessive bleeding, bruising, or elarged lymph nodes.  BP: 126/78  Pulse: 60  Temp: 97.9 F (36.6 C)  Resp: 16    Estimated body mass index is 22.73 kg/(m^2) as calculated from the following:   Height as of 04/20/13: 5\' 9"  (1.753 m).   Weight as of 04/20/13: 154 lb (69.854 kg).  Physical Exam General Appearance: Well nourished, in no apparent distress. Eyes: PERRLA, EOMs, conjunctiva no swelling or erythema, normal fundi and vessels. Sinuses: No frontal/maxillary tenderness ENT/Mouth: EACs patent / TMs  nl. Nares clear without erythema, swelling, mucoid exudates. Oral  hygiene is good. No erythema, swelling, or exudate. Tongue normal, non-obstructing. Tonsils not swollen or erythematous. Hearing normal.  Neck: Supple, thyroid normal. No bruits, nodes or JVD. Respiratory: Respiratory effort normal.  BS equal and clear bilateral without rales, rhonci, wheezing or stridor. Cardio: Heart sounds are normal with regular rate and rhythm and no murmurs, rubs or gallops. Peripheral pulses are normal and equal bilaterally without edema. No aortic or femoral bruits. Chest: symmetric with normal excursions and percussion.  Abdomen: Flat, soft, with bowl sounds. Nontender, no guarding, rebound, hernias, masses, or organomegaly.  Lymphatics: Non tender without lymphadenopathy.  Genitourinary: No hernias.Testes nl. DRE - prostate nl for age - smooth & firm w/o nodules. Musculoskeletal: RLE BKA and otherwise full ROM all peripheral extremities, joint stability, 5/5 strength, and normal gait. Skin: Warm and dry without rashes, lesions, cyanosis, clubbing or  ecchymosis.  Neuro: Cranial nerves intact, reflexes equal bilaterally. Normal muscle  tone, no cerebellar symptoms. Sensation intact.  Pysch: Awake and oriented X 3, normal affect, insight and judgment appropriate.   Assessment and Plan  1. Annual Screening Examination 2. Hypertension / ASHD and Hx/o MI - remote 3. Hyperlipidemia 4. Pre Diabetes 5. Vitamin D Deficiency 6. COPD  Continue prudent diet as discussed, weight control, BP monitoring, regular exercise, and medications as discussed.  Discussed med effects and SE's. Routine screening labs and tests as requested with regular follow-up as recommended.

## 2013-07-06 LAB — URINALYSIS, MICROSCOPIC ONLY
Bacteria, UA: NONE SEEN
CASTS: NONE SEEN
CRYSTALS: NONE SEEN
Squamous Epithelial / LPF: NONE SEEN

## 2013-07-06 LAB — LIPID PANEL
CHOL/HDL RATIO: 4.5 ratio
Cholesterol: 229 mg/dL — ABNORMAL HIGH (ref 0–200)
HDL: 51 mg/dL (ref >39)
LDL Cholesterol: 141 mg/dL — ABNORMAL HIGH (ref 0–99)
Triglycerides: 187 mg/dL — ABNORMAL HIGH (ref <150)
VLDL: 37 mg/dL (ref 0–40)

## 2013-07-06 LAB — BASIC METABOLIC PANEL WITH GFR
BUN: 14 mg/dL (ref 6–23)
CALCIUM: 9.8 mg/dL (ref 8.4–10.5)
CHLORIDE: 101 meq/L (ref 96–112)
CO2: 28 meq/L (ref 19–32)
Creat: 1 mg/dL (ref 0.50–1.35)
GFR, Est African American: 86 mL/min
GFR, Est Non African American: 74 mL/min
Glucose, Bld: 106 mg/dL — ABNORMAL HIGH (ref 70–99)
Potassium: 4.5 mEq/L (ref 3.5–5.3)
SODIUM: 139 meq/L (ref 135–145)

## 2013-07-06 LAB — MICROALBUMIN / CREATININE URINE RATIO
Creatinine, Urine: 201.7 mg/dL
MICROALB UR: 0.94 mg/dL (ref 0.00–1.89)
Microalb Creat Ratio: 4.7 mg/g (ref 0.0–30.0)

## 2013-07-06 LAB — HEPATIC FUNCTION PANEL
ALK PHOS: 50 U/L (ref 39–117)
ALT: 18 U/L (ref 0–53)
AST: 19 U/L (ref 0–37)
Albumin: 4.3 g/dL (ref 3.5–5.2)
BILIRUBIN DIRECT: 0.1 mg/dL (ref 0.0–0.3)
BILIRUBIN TOTAL: 0.5 mg/dL (ref 0.2–1.2)
Indirect Bilirubin: 0.4 mg/dL (ref 0.2–1.2)
Total Protein: 6.7 g/dL (ref 6.0–8.3)

## 2013-07-06 LAB — VITAMIN D 25 HYDROXY (VIT D DEFICIENCY, FRACTURES): Vit D, 25-Hydroxy: 35 ng/mL (ref 30–89)

## 2013-07-06 LAB — HEMOGLOBIN A1C
Hgb A1c MFr Bld: 6 % — ABNORMAL HIGH (ref <5.7)
MEAN PLASMA GLUCOSE: 126 mg/dL — AB (ref <117)

## 2013-07-06 LAB — TSH: TSH: 0.377 u[IU]/mL (ref 0.350–4.500)

## 2013-07-06 LAB — MAGNESIUM: MAGNESIUM: 1.8 mg/dL (ref 1.5–2.5)

## 2013-07-06 LAB — INSULIN, FASTING: Insulin fasting, serum: 9 u[IU]/mL (ref 3–28)

## 2013-08-09 ENCOUNTER — Encounter: Payer: Self-pay | Admitting: Internal Medicine

## 2014-01-10 ENCOUNTER — Encounter: Payer: Self-pay | Admitting: Internal Medicine

## 2014-01-10 NOTE — Progress Notes (Signed)
Patient ID: Leslie Cardenas, male   DOB: March 30, 1941, 73 y.o.   MRN: [id number]  [name]

## 2014-01-15 ENCOUNTER — Other Ambulatory Visit: Payer: Self-pay | Admitting: Internal Medicine

## 2014-01-15 ENCOUNTER — Encounter: Payer: Self-pay | Admitting: Internal Medicine

## 2014-01-15 ENCOUNTER — Ambulatory Visit (INDEPENDENT_AMBULATORY_CARE_PROVIDER_SITE_OTHER): Payer: Medicare PPO | Admitting: Internal Medicine

## 2014-01-15 VITALS — BP 128/80 | HR 58 | Temp 98.2°F | Resp 16 | Ht 69.0 in | Wt 150.0 lb

## 2014-01-15 DIAGNOSIS — E559 Vitamin D deficiency, unspecified: Secondary | ICD-10-CM

## 2014-01-15 DIAGNOSIS — R7309 Other abnormal glucose: Secondary | ICD-10-CM

## 2014-01-15 DIAGNOSIS — I10 Essential (primary) hypertension: Secondary | ICD-10-CM

## 2014-01-15 DIAGNOSIS — E785 Hyperlipidemia, unspecified: Secondary | ICD-10-CM

## 2014-01-15 DIAGNOSIS — Z89511 Acquired absence of right leg below knee: Secondary | ICD-10-CM | POA: Insufficient documentation

## 2014-01-15 DIAGNOSIS — Z79899 Other long term (current) drug therapy: Secondary | ICD-10-CM

## 2014-01-15 DIAGNOSIS — J441 Chronic obstructive pulmonary disease with (acute) exacerbation: Secondary | ICD-10-CM

## 2014-01-15 MED ORDER — BENZONATATE 100 MG PO CAPS: 100.0000 mg | ORAL_CAPSULE | Freq: Three times a day (TID) | ORAL | Status: DC | PRN

## 2014-01-15 NOTE — Progress Notes (Signed)
Patient ID: Leslie Cardenas, male   DOB: 05-Jul-1940, 73 y.o.   MRN: [id number]   This very nice 73 y.o.male presents for 3 month follow up with Hypertension, Hyperlipidemia, COPD, Pre-Diabetes and Vitamin D Deficiency. Patient is followed by Dr Melvyn Novas for his COPD and does admit DOE.    Patient is monitored expectantly for his labile HTN & BP has been controlled at home. Today's BP: 128/80 mmHg. Patient did have a MI in 38 and in 2013 had a negative Patient denies any cardiac type chest pain, palpitations, orthopnea/PND, dizziness, claudication, or dependent edema.   Hyperlipidemia is not controlled with diet & meds. Patient denies myalgias or other med SE's. Last Lipids were 07/05/2013: Cholesterol, Total 229*; HDL  51; LDL 141*; Triglycerides 187*   Also, the patient has history of PreDiabetes and patient denies any symptoms of reactive hypoglycemia, diabetic polys, paresthesias or visual blurring.  Last A1c was  6.0% on1/28/2015.   Further, Patient has history of Vitamin D Deficiency of 24 in 2008 and last Vit D was still very low, patient supplements vitamin D without any suspected side-effects. Last vitamin D was 07/05/2013: Vit D was 35.   Medication List   atorvastatin 40 MG tablet  Commonly known as:  LIPITOR  Take 1 tablet (40 mg total) by mouth at bedtime.     benzonatate 100 MG capsule  Commonly known as:  TESSALON PERLES  Take 1 capsule (100 mg total) by mouth 3 (three) times daily as needed for cough.     budesonide-formoterol 80-4.5 MCG/ACT inhaler  Commonly known as:  SYMBICORT  Inhale 2 puffs into the lungs 2 (two) times daily.     fluticasone 50 MCG/ACT nasal spray  Commonly known as:  FLONASE  Place 2 sprays into the nose daily.     gabapentin 300 MG capsule  Commonly known as:  NEURONTIN  Take 300 mg by mouth daily.     ipratropium-albuterol 0.5-2.5 (3) MG/3ML Soln  Commonly known as:  DUONEB     lansoprazole 30 MG capsule  Commonly known as:  PREVACID  Take 1  capsule (30 mg total) by mouth daily.     montelukast 10 MG tablet  Commonly known as:  SINGULAIR  Take 1 tablet (10 mg total) by mouth daily.     nitroGLYCERIN 0.4 MG SL tablet  Commonly known as:  NITROSTAT  Place 1 tablet (0.4 mg total) under the tongue every 5 (five) minutes x 3 doses as needed for chest pain.     predniSONE 10 MG tablet  Commonly known as:  DELTASONE  1 to 2 tablets daily as directed (takes 2 qod)     PROAIR HFA 108 (90 BASE) MCG/ACT inhaler  Generic drug:  albuterol  Inhale 2 puffs into the lungs every 4 (four) hours as needed.       No Known Allergies PMHx:   Past Medical History  Diagnosis Date  . MI (myocardial infarction) 1983  . Coronary artery disease   . COPD (chronic obstructive pulmonary disease)   . Traumatic amputation of right leg   . Hyperlipidemia   . Vitamin D deficiency   . Diverticulosis   . Adenomatous polyps 2006 and 2010  . Unspecified essential hypertension 07/05/2013   FHx:    Reviewed / unchanged SHx:    Reviewed / unchanged  Systems Review:  Constitutional: Denies fever, chills, wt changes, headaches, insomnia, fatigue, night sweats, change in appetite. Eyes: Denies redness, blurred vision, diplopia, discharge, itchy, watery eyes.  ENT: Denies discharge, congestion, post nasal drip, epistaxis, sore throat, earache, hearing loss, dental pain, tinnitus, vertigo, sinus pain, snoring.  CV: Denies chest pain, palpitations, irregular heartbeat, syncope, dyspnea, diaphoresis, orthopnea, PND, claudication or edema. Respiratory: denies cough, dyspnea, DOE, pleurisy, hoarseness, laryngitis, wheezing.  Gastrointestinal: Denies dysphagia, odynophagia, heartburn, reflux, water brash, abdominal pain or cramps, nausea, vomiting, bloating, diarrhea, constipation, hematemesis, melena, hematochezia  or hemorrhoids. Genitourinary: Denies dysuria, frequency, urgency, nocturia, hesitancy, discharge, hematuria or flank pain. Musculoskeletal: Denies  arthralgias, myalgias, stiffness, jt. swelling, pain, limping or strain/sprain.  Skin: Denies pruritus, rash, hives, warts, acne, eczema or change in skin lesion(s). Neuro: No weakness, tremor, incoordination, spasms, paresthesia or pain. Psychiatric: Denies confusion, memory loss or sensory loss. Endo: Denies change in weight, skin or hair change.  Heme/Lymph: No excessive bleeding, bruising or enlarged lymph nodes.  Exam:  BP 128/80  Pulse 58  Temp(Src) 98.2 F (36.8 C) (Temporal)  Resp 16  Ht 5\' 9"  (1.753 m)  Wt 150 lb (68.04 kg)  BMI 22.14 kg/m2  Appears well nourished and in no distress. Eyes: PERRLA, EOMs, conjunctiva no swelling or erythema. Sinuses: No frontal/maxillary tenderness ENT/Mouth: EAC's clear, TM's nl w/o erythema, bulging. Nares clear w/o erythema, swelling, exudates. Oropharynx clear without erythema or exudates. Oral hygiene is good. Tongue normal, non obstructing. Hearing intact.  Neck: Supple. Thyroid nl. Car 2+/2+ without bruits, nodes or JVD. Chest: Respirations nl with BS clear & equal w/o rales, rhonchi, wheezing or stridor.  Cor: Heart sounds normal w/ regular rate and rhythm without sig. murmurs, gallops, clicks, or rubs. Peripheral pulses normal and equal  without edema.  Abdomen: Soft & bowel sounds normal. Non-tender w/o guarding, rebound, hernias, masses, or organomegaly.  Lymphatics: Unremarkable.  Musculoskeletal: Full ROM all peripheral extremities, joint stability, 5/5 strength and Right BK prosthesis with minimal limp.  Skin: Warm, dry without exposed rashes, lesions or ecchymosis apparent.  Neuro: Cranial nerves intact, reflexes equal bilaterally. Sensory-motor testing grossly intact. Tendon reflexes grossly intact.  Pysch: Alert & oriented x 3. Insight and judgement nl & appropriate. No ideations.  Assessment and Plan:  1. Hypertension - Continue monitor blood pressure at home. Continue diet/meds same.  2. Hyperlipidemia - Continue  diet/meds, exercise,& lifestyle modifications. Continue monitor periodic cholesterol/liver & renal functions   3. Pre-Diabetes - Continue diet, exercise, lifestyle modifications. Monitor appropriate labs.  4. Vitamin D Deficiency - Continue supplementation.  5. COPD  Recommended regular exercise, BP monitoring, weight control, and discussed med and SE's. Recommended labs to assess and monitor clinical status. Further disposition pending results of labs.

## 2014-01-15 NOTE — Patient Instructions (Signed)

## 2014-01-16 LAB — BASIC METABOLIC PANEL WITH GFR
BUN: 10 mg/dL (ref 6–23)
CO2: 27 meq/L (ref 19–32)
Calcium: 10 mg/dL (ref 8.4–10.5)
Chloride: 100 mEq/L (ref 96–112)
Creat: 1 mg/dL (ref 0.50–1.35)
GFR, EST AFRICAN AMERICAN: 85 mL/min
GFR, Est Non African American: 74 mL/min
GLUCOSE: 73 mg/dL (ref 70–99)
POTASSIUM: 4.4 meq/L (ref 3.5–5.3)
Sodium: 138 mEq/L (ref 135–145)

## 2014-01-16 LAB — LIPID PANEL
CHOL/HDL RATIO: 4.8 ratio
CHOLESTEROL: 233 mg/dL — AB (ref 0–200)
HDL: 49 mg/dL (ref >39)
LDL CALC: 120 mg/dL — AB (ref 0–99)
Triglycerides: 319 mg/dL — ABNORMAL HIGH (ref <150)
VLDL: 64 mg/dL — AB (ref 0–40)

## 2014-01-16 LAB — CBC WITH DIFFERENTIAL/PLATELET
Basophils Absolute: 0.1 10*3/uL (ref 0.0–0.1)
Basophils Relative: 2 % — ABNORMAL HIGH (ref 0–1)
Eosinophils Absolute: 0.3 10*3/uL (ref 0.0–0.7)
Eosinophils Relative: 5 % (ref 0–5)
HEMATOCRIT: 47.1 % (ref 39.0–52.0)
HEMOGLOBIN: 16.5 g/dL (ref 13.0–17.0)
LYMPHS ABS: 1.1 10*3/uL (ref 0.7–4.0)
Lymphocytes Relative: 17 % (ref 12–46)
MCH: 33.3 pg (ref 26.0–34.0)
MCHC: 35 g/dL (ref 30.0–36.0)
MCV: 95.2 fL (ref 78.0–100.0)
MONOS PCT: 11 % (ref 3–12)
Monocytes Absolute: 0.7 10*3/uL (ref 0.1–1.0)
NEUTROS ABS: 4.3 10*3/uL (ref 1.7–7.7)
NEUTROS PCT: 65 % (ref 43–77)
Platelets: 334 10*3/uL (ref 150–400)
RBC: 4.95 MIL/uL (ref 4.22–5.81)
RDW: 13.7 % (ref 11.5–15.5)
WBC: 6.6 10*3/uL (ref 4.0–10.5)

## 2014-01-16 LAB — HEPATIC FUNCTION PANEL
ALK PHOS: 63 U/L (ref 39–117)
ALT: 17 U/L (ref 0–53)
AST: 20 U/L (ref 0–37)
Albumin: 4.5 g/dL (ref 3.5–5.2)
BILIRUBIN INDIRECT: 0.4 mg/dL (ref 0.2–1.2)
Bilirubin, Direct: 0.1 mg/dL (ref 0.0–0.3)
Total Bilirubin: 0.5 mg/dL (ref 0.2–1.2)
Total Protein: 6.6 g/dL (ref 6.0–8.3)

## 2014-01-16 LAB — VITAMIN D 25 HYDROXY (VIT D DEFICIENCY, FRACTURES): Vit D, 25-Hydroxy: 45 ng/mL (ref 30–89)

## 2014-01-16 LAB — MAGNESIUM: Magnesium: 2 mg/dL (ref 1.5–2.5)

## 2014-01-16 LAB — INSULIN, FASTING: Insulin fasting, serum: 9 u[IU]/mL (ref 3–28)

## 2014-01-16 LAB — HEMOGLOBIN A1C
HEMOGLOBIN A1C: 6.1 % — AB (ref <5.7)
Mean Plasma Glucose: 128 mg/dL — ABNORMAL HIGH (ref <117)

## 2014-01-16 LAB — TSH: TSH: 1.178 u[IU]/mL (ref 0.350–4.500)

## 2014-03-03 ENCOUNTER — Encounter: Payer: Self-pay | Admitting: Internal Medicine

## 2014-03-26 ENCOUNTER — Encounter: Payer: Self-pay | Admitting: Internal Medicine

## 2014-04-02 ENCOUNTER — Other Ambulatory Visit: Payer: Self-pay | Admitting: Internal Medicine

## 2014-04-02 DIAGNOSIS — J441 Chronic obstructive pulmonary disease with (acute) exacerbation: Secondary | ICD-10-CM

## 2014-04-02 MED ORDER — PREDNISONE 10 MG PO TABS: ORAL_TABLET | ORAL | Status: DC

## 2014-04-06 NOTE — Telephone Encounter (Signed)
Doxycycline 100mg  Twice daily  #14 , no refills  mucinex dm Twice daily  As needed   Fluids and rest  Ov next week to be checked  If worse ER  Please contact office for sooner follow up if symptoms do not improve or worsen or seek emergency care

## 2014-04-06 NOTE — Telephone Encounter (Signed)
Pt c/o chest congestion, changed in color of mucus to yellow and increased cough x 1 week.  Requesting refill of Doxy 100mg  Pt states that he coughs a lot in the mornings trying to clear chest congestion.  Please advise Tammy Parrett as Dr Chase Caller is not in office. Thanks.  No Known Allergies

## 2014-04-06 NOTE — Telephone Encounter (Signed)
Called pt and is aware of recs. RX sent in. Pt wanted to wait on appt. Nothing further needed

## 2014-04-17 ENCOUNTER — Ambulatory Visit: Payer: Self-pay | Admitting: Physician Assistant

## 2014-04-19 ENCOUNTER — Ambulatory Visit (AMBULATORY_SURGERY_CENTER): Payer: Self-pay | Admitting: *Deleted

## 2014-04-19 VITALS — Ht 69.0 in | Wt 150.0 lb

## 2014-04-19 DIAGNOSIS — Z86010 Personal history of colonic polyps: Secondary | ICD-10-CM

## 2014-04-19 NOTE — Progress Notes (Signed)
Denies allergies to eggs or soy products. Denies complications with sedation or anesthesia. Denies O2 use. Denies use of diet or weight loss medications.  Emmi instructions given for colonoscopy.  

## 2014-04-26 ENCOUNTER — Encounter: Payer: Self-pay | Admitting: Internal Medicine

## 2014-05-07 ENCOUNTER — Encounter: Payer: Medicare PPO | Admitting: Internal Medicine

## 2014-05-07 ENCOUNTER — Telehealth: Payer: Self-pay | Admitting: Internal Medicine

## 2014-05-07 NOTE — Telephone Encounter (Signed)
No charge. 

## 2014-05-21 ENCOUNTER — Other Ambulatory Visit: Payer: Self-pay | Admitting: *Deleted

## 2014-05-21 DIAGNOSIS — J441 Chronic obstructive pulmonary disease with (acute) exacerbation: Secondary | ICD-10-CM

## 2014-05-21 MED ORDER — PREDNISONE 10 MG PO TABS: ORAL_TABLET | ORAL | Status: DC

## 2014-07-10 ENCOUNTER — Encounter: Payer: Self-pay | Admitting: Internal Medicine

## 2014-07-25 ENCOUNTER — Ambulatory Visit (INDEPENDENT_AMBULATORY_CARE_PROVIDER_SITE_OTHER): Payer: Medicare PPO | Admitting: Internal Medicine

## 2014-07-25 ENCOUNTER — Encounter: Payer: Self-pay | Admitting: Internal Medicine

## 2014-07-25 VITALS — BP 138/70 | HR 64 | Temp 98.1°F | Resp 16 | Ht 68.5 in | Wt 148.2 lb

## 2014-07-25 DIAGNOSIS — Z0000 Encounter for general adult medical examination without abnormal findings: Secondary | ICD-10-CM

## 2014-07-25 DIAGNOSIS — Z9181 History of falling: Secondary | ICD-10-CM

## 2014-07-25 DIAGNOSIS — R7303 Prediabetes: Secondary | ICD-10-CM

## 2014-07-25 DIAGNOSIS — E559 Vitamin D deficiency, unspecified: Secondary | ICD-10-CM

## 2014-07-25 DIAGNOSIS — Z79899 Other long term (current) drug therapy: Secondary | ICD-10-CM

## 2014-07-25 DIAGNOSIS — Z23 Encounter for immunization: Secondary | ICD-10-CM

## 2014-07-25 DIAGNOSIS — Z1331 Encounter for screening for depression: Secondary | ICD-10-CM

## 2014-07-25 DIAGNOSIS — I2510 Atherosclerotic heart disease of native coronary artery without angina pectoris: Secondary | ICD-10-CM

## 2014-07-25 DIAGNOSIS — Z125 Encounter for screening for malignant neoplasm of prostate: Secondary | ICD-10-CM

## 2014-07-25 DIAGNOSIS — E785 Hyperlipidemia, unspecified: Secondary | ICD-10-CM

## 2014-07-25 DIAGNOSIS — J42 Unspecified chronic bronchitis: Secondary | ICD-10-CM

## 2014-07-25 DIAGNOSIS — I10 Essential (primary) hypertension: Secondary | ICD-10-CM

## 2014-07-25 DIAGNOSIS — Z1212 Encounter for screening for malignant neoplasm of rectum: Secondary | ICD-10-CM

## 2014-07-25 NOTE — Progress Notes (Signed)
Patient ID: Leslie Cardenas, male   DOB: 07-23-1940, 74 y.o.   MRN: [id number]  [facility] VISIT AND CPE  Assessment:   1. Essential hypertension  - Microalbumin / creatinine urine ratio - EKG 12-Lead - [place], RETROPERITNL ABD,  LTD - TSH  2. Chronic bronchitis, unspecified chronic bronchitis type   3. Hyperlipidemia  - Lipid panel  4. ASHD hx/o MI   5. Prediabetes  - Hemoglobin A1c - Insulin, fasting  6. Vitamin D deficiency  - Vit D  25 hydroxy (rtn osteoporosis monitoring)  7. Screening for rectal cancer  - POC Hemoccult Bld/Stl (3-Cd Home Screen); Future  8. Prostate cancer screening  - PSA  9. Depression screen   10. At low risk for fall   11. Medication management  - Urine Microscopic - CBC with Differential/Platelet - BASIC METABOLIC PANEL WITH GFR - Hepatic function panel - Magnesium  12. Routine general medical examination at a health care facility   13. Need for prophylactic vaccination against Streptococcus pneumoniae (pneumococcus)  - Pneumococcal conjugate vaccine 13-valent  Plan:   During the course of the visit the patient was educated and counseled about appropriate screening and preventive services including:    Pneumococcal vaccine   Influenza vaccine  Td vaccine  Screening electrocardiogram  Bone densitometry screening  Colorectal cancer screening  Diabetes screening  Glaucoma screening  Nutrition counseling   Advanced directives: requested  Screening recommendations, referrals: Vaccinations:  Immunization History  Administered Date(s) Administered  . Influenza Split 04/08/2012  . Pneumococcal Conjugate-13 07/25/2014  . Pneumococcal Polysaccharide-23 04/08/2012  . Pneumococcal-Unspecified 06/06/2002  . Td 06/30/2011  . Zoster 05/22/2008   Hep B vaccine not indicated  Nutrition assessed and recommended  Colonoscopy 08/12/2010 Recommended yearly ophthalmology/optometry visit for glaucoma  screening and checkup Recommended yearly dental visit for hygiene and checkup Advanced directives - yes  Conditions/risks identified: BMI: Discussed weight loss, diet, and increase physical activity.  Increase physical activity: AHA recommends 150 minutes of physical activity a week.  Medications reviewed PreDiabetes is at goal, ACE/ARB therapy: not indicated Urinary Incontinence is not an issue: discussed non pharmacology and pharmacology options.  Fall risk: low- discussed PT, home fall assessment, medications.   Subjective:  Leslie Cardenas presents for Medicare Annual Wellness Visit and complete physical.  Date of last medicare wellness visit is unknown.This very nice 74 y.o. MWM also presents for annual CPE with Hypertension, Hyperlipidemia, Pre-Diabetes, COPD  and Vitamin D Deficiency.    Patient is treated for HTN & BP has been controlled at home. In 1983 patient had an MI. He has done well since & in June 2013 he had a negative Lexiscan by Dr Cathie Olden. Today's BP was 138/70 mmHg. Patient has had no complaints of any cardiac type chest pain, palpitations, dyspnea/orthopnea/PND, dizziness, claudication, or dependent edema. Patient has COPD and has non-productive dry cough and annual CXR's show no sign of cancer. He is maintained on low dose prednisone with improved functional capacity.    Hyperlipidemia is not controlled with diet & meds. Patient denies myalgias or other med SE's. Last Lipids were not at goal - Total Chol 233; HDL 49; LDL 120; and with elevated Trig 319 on 01/15/2014.   Also, the patient has history of  PreDiabetes since Jan 2011 with an A1c of 5.9% and has had no symptoms of reactive hypoglycemia, diabetic polys, paresthesias or visual blurring.  Last A1c was  6.1% on 01/15/2014.    Further, the patient also has history of Vitamin  D Deficiency of 24 in 2008 and supplements vitamin D without any suspected side-effects. Last vitamin D was  45 on  01/15/2014.  Names of Other  Physician/Practitioners you currently use: 1. [facility] here for primary care 2. Dr Luberta Mutter, eye doctor, last visit 2014 3. No dentist, has dentures  Patient Care Team: Unk Pinto, MD as PCP - General (Internal Medicine) Roselie Awkward, MD as Consulting Physician (Ophthalmology) Gatha Mayer, MD as Consulting Physician (Gastroenterology) Brand Males, MD as Consulting Physician (Pulmonary Disease)  Medication Review: Medication Sig  . atorvastatin (LIPITOR) 80 MG tablet take one tablet by mouth daily for cholesterol  . budesonide-formoterol (SYMBICORT) 80-4.5 inhaler Inhale 2 puffs into the lungs 2 (two) times daily.  . fenofibrate micronized (LOFIBRA) 134 MG capsule Take one capsule by mouth one time daily for blood "fats".  . fluticasone (FLONASE) 50 MCG/ACT nasal spray Place 2 sprays into the nose daily.  Marland Kitchen ipratropium-albuterol (DUONEB) 0.5-2.5 (3) /3ML SOLN   . lansoprazole (PREVACID) 30 MG capsule Take 1 capsule (30 mg total) by mouth daily.  . Nebulizers \ MISC   . nitroGLYCERIN (NITROSTAT) 0.4 MG SL tablet Place 1 tablet (0.4 mg total) under the tongue every 5 (five) minutes x 3 doses as needed .  Marland Kitchen PROAIR HFA  inhaler Inhale 2 puffs into the lungs every 4 (four) hours as needed.   Current Problems (verified) Patient Active Problem List   Diagnosis Date Noted  . 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 Essential hypertension 07/05/2013  . Prediabetes 07/05/2013  . Vitamin D deficiency 07/05/2013  . Medication management 07/05/2013  . Personal history of colonic adenomas 03/30/2012  . Hyperlipidemia 11/13/2011  . COPD (chronic obstructive pulmonary disease) 11/13/2011  . Sinus bradycardia 11/13/2011    Screening Tests Health Maintenance  Topic Date Due  . INFLUENZA VACCINE  01/06/2014  . COLONOSCOPY  08/11/2020  . TETANUS/TDAP  06/29/2021  . PNEUMOCOCCAL POLYSACCHARIDE VACCINE AGE 28 AND  OVER  Completed  . ZOSTAVAX  Completed    Immunization History  Administered Date(s) Administered  . Influenza Split 04/08/2012  . Pneumococcal Conjugate-13 07/25/2014  . Pneumococcal Polysaccharide-23 04/08/2012  . Pneumococcal-Unspecified 06/06/2002  . Td 06/30/2011  . Zoster 05/22/2008   Preventative care: Last colonoscopy: 08/12/2010  History reviewed: allergies, current medications, past family history, past medical history, past social history, past surgical history and problem list  Risk Factors: Tobacco History  Substance Use Topics  . Smoking status: Former Smoker -- 3.00 packs/day for 20 years    Types: Cigarettes    Quit date: 04/11/2000  . Smokeless tobacco: Never Used  . Alcohol Use: No   He does not smoke.  Patient is a former smoker. Are there smokers in your home (other than you)?  No  Alcohol Current alcohol use: none  Caffeine Current caffeine use: coffee 2 cup /day  Exercise Current exercise: gardening, housecleaning and walking  Nutrition/Diet Current diet: in general, a "healthy" diet    Cardiac risk factors: advanced age (older than 13 for men, 30 for women), dyslipidemia, family history of premature cardiovascular disease, hypertension, male gender, sedentary lifestyle and smoking/ tobacco exposure.  Depression Screen (Note: if answer to either of the following is "Yes", a more complete depression screening is indicated)   Q1: Over the past two weeks, have you felt down, depressed or hopeless? No  Q2: Over the past two weeks, have you felt little interest or pleasure  in doing things? No  Have you lost interest or pleasure in daily life? No  Do you often feel hopeless? No  Do you cry easily over simple problems? No  Activities of Daily Living In your present state of health, do you have any difficulty performing the following activities?:  Driving? No Managing money?  No Feeding yourself? No Getting from bed to chair? No Climbing a flight  of stairs? No Preparing food and eating?: No Bathing or showering? No Getting dressed: No Getting to the toilet? No Using the toilet:No Moving around from place to place: No In the past year have you fallen or had a near fall?:No   Are you sexually active?  Yes  Do you have more than one partner?  No  Vision Difficulties: No  Hearing Difficulties: No Do you often ask people to speak up or repeat themselves? No Do you experience ringing or noises in your ears? No Do you have difficulty understanding soft or whispered voices? No  Cognition  Do you feel that you have a problem with memory?No  Do you often misplace items? No  Do you feel safe at home?  Yes  Advanced directives Does patient have a [place]? Yes Does patient have a Living Will? Yes  Past Medical History  Diagnosis Date  . MI (myocardial infarction) 1983  . Coronary artery disease   . COPD (chronic obstructive pulmonary disease)   . Traumatic amputation of right leg   . Hyperlipidemia   . Vitamin D deficiency   . Diverticulosis   . Adenomatous polyps 2006 and 2010  . Unspecified essential hypertension 07/05/2013   Past Surgical History  Procedure Laterality Date  . Right leg amputation    . Cardiac catheterization  1983    No intervention performed.   . Colonoscopy w/ polypectomy  08/12/2010    Dr. Silvano Rusk  . Inguinal hernia repair      left  . Esophagogastroduodenoscopy    . Shoulder arthroscopy Left 2000   ROS: Constitutional: Denies fever, chills, weight loss/gain, headaches, insomnia, fatigue, night sweats or change in appetite. Eyes: Denies redness, blurred vision, diplopia, discharge, itchy or watery eyes.  ENT: Denies discharge, congestion, post nasal drip, epistaxis, sore throat, earache, hearing loss, dental pain, Tinnitus, Vertigo, Sinus pain or snoring.  Cardio: Denies chest pain, palpitations, irregular heartbeat, syncope, dyspnea, diaphoresis, orthopnea, PND,  claudication or edema Respiratory: denies cough, dyspnea, DOE, pleurisy, hoarseness, laryngitis or wheezing.  Gastrointestinal: Denies dysphagia, heartburn, reflux, water brash, pain, cramps, nausea, vomiting, bloating, diarrhea, constipation, hematemesis, melena, hematochezia, jaundice or hemorrhoids Genitourinary: Denies dysuria, frequency, urgency, nocturia, hesitancy, discharge, hematuria or flank pain Musculoskeletal: Denies arthralgia, myalgia, stiffness, Jt. Swelling, pain, limp or strain/sprain. Denies Falls. Skin: Denies puritis, rash, hives, warts, acne, eczema or change in skin lesion Neuro: No weakness, tremor, incoordination, spasms, paresthesia or pain Psychiatric: Denies confusion, memory loss or sensory loss. Denies Depression. Endocrine: Denies change in weight, skin, hair change, nocturia, and paresthesia, diabetic polys, visual blurring or hyper / hypo glycemic episodes.  Heme/Lymph: No excessive bleeding, bruising or enlarged lymph nodes.  Objective:     BP 138/70 mmHg  Pulse 64  Temp(Src) 98.1 F (36.7 C)  Resp 16  Ht 5' 8.5" (1.74 m)  Wt 148 lb 3.2 oz (67.223 kg)  BMI 22.20 kg/m2  General Appearance:  Alert  WD/WN, male , in no apparent distress. Eyes: PERRLA, EOMs, conjunctiva no swelling or erythema, normal fundi and vessels. Sinuses: No frontal/maxillary  tenderness ENT/Mouth: EACs patent / TMs  nl. Nares clear without erythema, swelling, mucoid exudates. Oral hygiene is good. No erythema, swelling, or exudate. Tongue normal, non-obstructing. Tonsils not swollen or erythematous. Hearing normal.  Neck: Supple, thyroid normal. No bruits, nodes or JVD. Respiratory: Respiratory effort normal.  BS distant, equal and clear bilateral without rales, rhonci, wheezing or stridor. Cardio: Heart sounds are normal with regular rate and rhythm and no murmurs, rubs or gallops. Peripheral pulses are normal and equal bilaterally without edema. No aortic or femoral bruits. Chest:  symmetric with normal excursions and percussion.  Abdomen: Flat, soft, with nl bowel sounds. Nontender, no guarding, rebound, hernias, masses, or organomegaly.  Lymphatics: Non tender without lymphadenopathy.  Genitourinary: No hernias.Testes nl. DRE - prostate nl for age - smooth & firm w/o nodules. Musculoskeletal: Full ROM all peripheral extremities, joint stability, 5/5 strength, and normal gait. Skin: Warm and dry without rashes, lesions, cyanosis, clubbing or  ecchymosis.  Neuro: Cranial nerves intact, reflexes equal bilaterally. Normal muscle tone, no cerebellar symptoms. Sensation intact.  Pysch: Awake and oriented X 3 with normal affect, insight and judgment appropriate.   Cognitive Testing  Alert? Yes  Normal Appearance? Yes  Oriented to person? Yes  Place? Yes   Time? Yes  Recall of three objects?  Yes  Can perform simple calculations? Yes  Displays appropriate judgment? Yes  Can read the correct time from a watch/clock? Yes  Medicare Attestation I have personally reviewed: The patient's medical and social history Their use of alcohol, tobacco or illicit drugs Their current medications and supplements The patient's functional ability including ADLs,fall risks, home safety risks, cognitive, and hearing and visual impairment Diet and physical activities Evidence for depression or mood disorders  The patient's weight, height, BMI, and visual acuity have been recorded in the chart.  I have made referrals, counseling, and provided education to the patient based on review of the above and I have provided the patient with a written personalized care plan for preventive services.    Shaul Trautman DAVID, MD   07/25/2014

## 2014-07-25 NOTE — Patient Instructions (Signed)

## 2014-07-26 LAB — CBC WITH DIFFERENTIAL/PLATELET
Basophils Absolute: 0.1 10*3/uL (ref 0.0–0.1)
Basophils Relative: 1 % (ref 0–1)
EOS PCT: 4 % (ref 0–5)
Eosinophils Absolute: 0.3 10*3/uL (ref 0.0–0.7)
HEMATOCRIT: 44.3 % (ref 39.0–52.0)
Hemoglobin: 14.7 g/dL (ref 13.0–17.0)
Lymphocytes Relative: 18 % (ref 12–46)
Lymphs Abs: 1.5 10*3/uL (ref 0.7–4.0)
MCH: 32.2 pg (ref 26.0–34.0)
MCHC: 33.2 g/dL (ref 30.0–36.0)
MCV: 96.9 fL (ref 78.0–100.0)
MONO ABS: 0.7 10*3/uL (ref 0.1–1.0)
MONOS PCT: 8 % (ref 3–12)
MPV: 8.7 fL (ref 8.6–12.4)
Neutro Abs: 5.8 10*3/uL (ref 1.7–7.7)
Neutrophils Relative %: 69 % (ref 43–77)
Platelets: 402 10*3/uL — ABNORMAL HIGH (ref 150–400)
RBC: 4.57 MIL/uL (ref 4.22–5.81)
RDW: 13.4 % (ref 11.5–15.5)
WBC: 8.4 10*3/uL (ref 4.0–10.5)

## 2014-07-26 LAB — HEPATIC FUNCTION PANEL
ALK PHOS: 65 U/L (ref 39–117)
ALT: 14 U/L (ref 0–53)
AST: 17 U/L (ref 0–37)
Albumin: 4.2 g/dL (ref 3.5–5.2)
BILIRUBIN TOTAL: 0.4 mg/dL (ref 0.2–1.2)
Bilirubin, Direct: 0.1 mg/dL (ref 0.0–0.3)
Indirect Bilirubin: 0.3 mg/dL (ref 0.2–1.2)
TOTAL PROTEIN: 6.6 g/dL (ref 6.0–8.3)

## 2014-07-26 LAB — BASIC METABOLIC PANEL WITH GFR
BUN: 16 mg/dL (ref 6–23)
CALCIUM: 10.3 mg/dL (ref 8.4–10.5)
CO2: 33 mEq/L — ABNORMAL HIGH (ref 19–32)
Chloride: 103 mEq/L (ref 96–112)
Creat: 1 mg/dL (ref 0.50–1.35)
GFR, EST NON AFRICAN AMERICAN: 74 mL/min
GFR, Est African American: 85 mL/min
GLUCOSE: 87 mg/dL (ref 70–99)
POTASSIUM: 5.4 meq/L — AB (ref 3.5–5.3)
Sodium: 139 mEq/L (ref 135–145)

## 2014-07-26 LAB — INSULIN, FASTING: Insulin fasting, serum: 3.5 u[IU]/mL (ref 2.0–19.6)

## 2014-07-26 LAB — HEMOGLOBIN A1C
Hgb A1c MFr Bld: 6.4 % — ABNORMAL HIGH (ref <5.7)
Mean Plasma Glucose: 137 mg/dL — ABNORMAL HIGH (ref <117)

## 2014-07-26 LAB — TSH: TSH: 1.366 u[IU]/mL (ref 0.350–4.500)

## 2014-07-26 LAB — LIPID PANEL
CHOL/HDL RATIO: 4.8 ratio
CHOLESTEROL: 223 mg/dL — AB (ref 0–200)
HDL: 46 mg/dL (ref >39)
LDL Cholesterol: 146 mg/dL — ABNORMAL HIGH (ref 0–99)
TRIGLYCERIDES: 153 mg/dL — AB (ref <150)
VLDL: 31 mg/dL (ref 0–40)

## 2014-07-26 LAB — URINALYSIS, MICROSCOPIC ONLY
Bacteria, UA: NONE SEEN
CASTS: NONE SEEN
Crystals: NONE SEEN
Squamous Epithelial / LPF: NONE SEEN

## 2014-07-26 LAB — MAGNESIUM: MAGNESIUM: 1.8 mg/dL (ref 1.5–2.5)

## 2014-07-26 LAB — PSA: PSA: 1.73 ng/mL (ref <=4.00)

## 2014-07-26 LAB — MICROALBUMIN / CREATININE URINE RATIO
Creatinine, Urine: 241.9 mg/dL
MICROALB/CREAT RATIO: 5 mg/g (ref 0.0–30.0)
Microalb, Ur: 1.2 mg/dL (ref <2.0)

## 2014-07-26 LAB — VITAMIN D 25 HYDROXY (VIT D DEFICIENCY, FRACTURES): Vit D, 25-Hydroxy: 84 ng/mL (ref 30–100)

## 2014-07-30 ENCOUNTER — Telehealth: Payer: Self-pay | Admitting: *Deleted

## 2014-07-30 NOTE — Telephone Encounter (Signed)
Pt aware of lab results 

## 2014-07-30 NOTE — Telephone Encounter (Signed)
-----   Message from Unk Pinto, MD sent at 07/29/2014 10:16 PM EST ----- Regarding: labs - mag very low - recc take Mag 500 mg 2 x day - Chol 2234 too high - Bad LDL Chol 146 - way way too high - Cholesterol is too high - Recommend low cholesterol diet - Cholesterol only comes from animal sources - ie. meat, dairy, eggs - Eat all the vegetables you want - avoid meat, especially red meat and dairy - especially cheese. Cheese is the most concentrated form of trans-fats which is the worst thing to clog up our arteries. Veggie cheese is OK which can be found in the fresh produce section at [employer] or [employer] or [employer]  - A1c up from 6.1 to 6.4% to diabetic range - need stricter diet & weight loss - Your blood sugar and A1c are elevated. Being diabetic has a 300% increased risk for heart attack, stroke, cancer, and alzheimer- type vascular dementia. It is very important that you work harder with diet by avoiding all foods that are white except chicken & fish. Avoid white rice (brown & wild rice is OK), white potatoes (sweetpotatoes in moderation is OK), White bread or wheat bread or anything made out of white flour like bagels, donuts, rolls, buns, biscuits, cakes, pastries, cookies, pizza crust, and pasta (made from white flour & egg whites) - vegetarian pasta or spinach or wheat pasta is OK. Multigrain breads like Arnold's or [employer], or multigrain sandwich thins or flatbreads. Diet, exercise and weight loss can reverse and cure diabetes in the early stages. Diet, exercise and weight loss is very important in the control and prevention of complications of diabetes which affects every system in your body, ie. Brain - dementia/stroke, eyes - glaucoma/blindness, heart - heart attack/heart failure, kidneys - dialysis, stomach - gastric paralysis, intestines - malabsorption, nerves - severe painful neuritis, circulation - gangrene & loss of a leg(s), and finally cancer and Alzheimers

## 2014-11-28 ENCOUNTER — Encounter: Payer: Self-pay | Admitting: Internal Medicine

## 2014-12-18 ENCOUNTER — Ambulatory Visit (HOSPITAL_COMMUNITY)
Admission: RE | Admit: 2014-12-18 | Discharge: 2014-12-18 | Disposition: A | Discharge status: Home / Self Care | Payer: Medicare PPO | Source: Ambulatory Visit | Attending: Internal Medicine | Admitting: Internal Medicine

## 2014-12-18 ENCOUNTER — Encounter: Payer: Self-pay | Admitting: Internal Medicine

## 2014-12-18 ENCOUNTER — Ambulatory Visit (INDEPENDENT_AMBULATORY_CARE_PROVIDER_SITE_OTHER): Payer: Medicare PPO | Admitting: Internal Medicine

## 2014-12-18 VITALS — BP 140/80 | HR 78 | Temp 98.0°F | Resp 16 | Ht 69.0 in | Wt 140.0 lb

## 2014-12-18 DIAGNOSIS — R05 Cough: Secondary | ICD-10-CM | POA: Diagnosis not present

## 2014-12-18 DIAGNOSIS — J42 Unspecified chronic bronchitis: Secondary | ICD-10-CM

## 2014-12-18 DIAGNOSIS — R634 Abnormal weight loss: Secondary | ICD-10-CM | POA: Diagnosis not present

## 2014-12-18 DIAGNOSIS — R0600 Dyspnea, unspecified: Secondary | ICD-10-CM

## 2014-12-18 DIAGNOSIS — R0602 Shortness of breath: Secondary | ICD-10-CM | POA: Diagnosis not present

## 2014-12-18 DIAGNOSIS — R0989 Other specified symptoms and signs involving the circulatory and respiratory systems: Secondary | ICD-10-CM | POA: Diagnosis not present

## 2014-12-18 DIAGNOSIS — Z87891 Personal history of nicotine dependence: Secondary | ICD-10-CM | POA: Diagnosis not present

## 2014-12-18 MED ORDER — AZITHROMYCIN 250 MG PO TABS: ORAL_TABLET | ORAL | Status: DC

## 2014-12-18 MED ORDER — PREDNISONE 20 MG PO TABS: ORAL_TABLET | ORAL | Status: DC

## 2014-12-18 MED ORDER — BENZONATATE 200 MG PO CAPS: 200.0000 mg | ORAL_CAPSULE | Freq: Three times a day (TID) | ORAL | Status: DC | PRN

## 2014-12-18 MED ORDER — PHENYLEPH-PROMETHAZINE-COD 5-6.25-10 MG/5ML PO SYRP: 5.0000 mL | ORAL_SOLUTION | Freq: Every evening | ORAL | Status: DC | PRN

## 2014-12-18 NOTE — Progress Notes (Addendum)
   Subjective:    Patient ID: Leslie Cardenas, male    DOB: 1940-06-19, 74 y.o.   MRN: [id number]  HPI  Patient presents with his daughter for evaluation of shortness of breath and also some weakness and fatigue.  They feel that this has been severe for the last week but they feel that this has been slowly getting worse for the past couple months.  He has been coughing, and has a lot more shortness of breath lately.  He reports that he has not been able to walk more than 30 feet before he feels like he must rest.  He reports that he has been bringing up yellow phlegm.  He reports that he is a former smoker and has a 60 pack year history of smoking.  He reports that he does have some chest tightness and wheezing intermittently.  He reports that he has lost 10 lbs in the past year without trying.  He has early satiety.   Patient also reports that he has been having some wear and tear in his right prosthetic for his BTK.  He reports that he thinks that he needs some new equipment to get it to work better.    Review of Systems  Constitutional: Positive for chills, appetite change and fatigue. Negative for fever.  HENT: Positive for congestion, ear pain and rhinorrhea. Negative for sore throat.   Respiratory: Positive for cough, chest tightness, shortness of breath and wheezing.   Cardiovascular: Negative for chest pain, palpitations and leg swelling.  Neurological: Positive for light-headedness.       Objective:   Physical Exam  Constitutional: He is oriented to person, place, and time. He appears well-developed and well-nourished. No distress.  HENT:  Head: Normocephalic and atraumatic.  Mouth/Throat: Oropharynx is clear and moist. No oropharyngeal exudate.  Eyes: Conjunctivae are normal. No scleral icterus.  Neck: Normal range of motion. Neck supple. No JVD present. No thyromegaly present.  Cardiovascular: Normal rate, regular rhythm, normal heart sounds and intact distal pulses.  Exam reveals  no gallop and no friction rub.   No murmur heard. Pulmonary/Chest: Effort normal. No respiratory distress. He has wheezes. He has no rales. He exhibits no tenderness.  Generalized wheeze  Abdominal: Soft. Bowel sounds are normal. He exhibits no distension and no mass. There is no tenderness. There is no rebound and no guarding.  Musculoskeletal: Normal range of motion.  Right BKA  Lymphadenopathy:    He has no cervical adenopathy.  Neurological: He is alert and oriented to person, place, and time.  Skin: Skin is warm and dry. He is not diaphoretic.  Psychiatric: He has a normal mood and affect. His behavior is normal. Judgment and thought content normal.  Nursing note and vitals reviewed.   Wt Readings from Last 3 Encounters:  12/18/14 140 lb (63.504 kg)  07/25/14 148 lb 3.2 oz (67.223 kg)  04/19/14 150 lb (68.04 kg)          Assessment & Plan:   1. Chronic bronchitis, unspecified chronic bronchitis type -likely COPD exacerbation but concerns for possible underlying condition will check BNP for heart failure and need to screen for cancer -after acute illness consider low dose CT scan - DG Chest 2 View; Future -zpak  -tessalon -prednisone -anoro samples given -d/c spiriva  2. Dyspnea  - CBC with Differential/Platelet - BASIC METABOLIC PANEL WITH GFR - Hepatic function panel - Brain natriuretic peptide (15176) - DG Chest 2 View; Future

## 2014-12-18 NOTE — Patient Instructions (Signed)

## 2014-12-19 ENCOUNTER — Telehealth: Payer: Self-pay | Admitting: Internal Medicine

## 2014-12-19 LAB — CBC WITH DIFFERENTIAL/PLATELET
Basophils Absolute: 0.1 10*3/uL (ref 0.0–0.1)
Basophils Relative: 1 % (ref 0–1)
EOS PCT: 3 % (ref 0–5)
Eosinophils Absolute: 0.4 10*3/uL (ref 0.0–0.7)
HCT: 42.3 % (ref 39.0–52.0)
Hemoglobin: 14.4 g/dL (ref 13.0–17.0)
LYMPHS ABS: 1.1 10*3/uL (ref 0.7–4.0)
LYMPHS PCT: 9 % — AB (ref 12–46)
MCH: 32 pg (ref 26.0–34.0)
MCHC: 34 g/dL (ref 30.0–36.0)
MCV: 94 fL (ref 78.0–100.0)
MONO ABS: 1.1 10*3/uL — AB (ref 0.1–1.0)
MPV: 8.7 fL (ref 8.6–12.4)
Monocytes Relative: 9 % (ref 3–12)
Neutro Abs: 9.8 10*3/uL — ABNORMAL HIGH (ref 1.7–7.7)
Neutrophils Relative %: 78 % — ABNORMAL HIGH (ref 43–77)
Platelets: 343 10*3/uL (ref 150–400)
RBC: 4.5 MIL/uL (ref 4.22–5.81)
RDW: 13.4 % (ref 11.5–15.5)
WBC: 12.5 10*3/uL — AB (ref 4.0–10.5)

## 2014-12-19 LAB — HEPATIC FUNCTION PANEL
ALBUMIN: 3.7 g/dL (ref 3.5–5.2)
ALK PHOS: 75 U/L (ref 39–117)
ALT: 13 U/L (ref 0–53)
AST: 17 U/L (ref 0–37)
BILIRUBIN TOTAL: 0.4 mg/dL (ref 0.2–1.2)
Bilirubin, Direct: 0.1 mg/dL (ref 0.0–0.3)
Indirect Bilirubin: 0.3 mg/dL (ref 0.2–1.2)
Total Protein: 6.5 g/dL (ref 6.0–8.3)

## 2014-12-19 LAB — BASIC METABOLIC PANEL WITH GFR
BUN: 13 mg/dL (ref 6–23)
CO2: 30 mEq/L (ref 19–32)
Calcium: 9.5 mg/dL (ref 8.4–10.5)
Chloride: 102 mEq/L (ref 96–112)
Creat: 1 mg/dL (ref 0.50–1.35)
GFR, Est African American: 85 mL/min
GFR, Est Non African American: 73 mL/min
GLUCOSE: 81 mg/dL (ref 70–99)
POTASSIUM: 4.8 meq/L (ref 3.5–5.3)
Sodium: 141 mEq/L (ref 135–145)

## 2014-12-19 LAB — BRAIN NATRIURETIC PEPTIDE: BRAIN NATRIURETIC PEPTIDE: 17.8 pg/mL (ref 0.0–100.0)

## 2014-12-19 NOTE — Telephone Encounter (Signed)
Hi Courtney,  Patient called and stated that he was told by our office the xray was clear and that a chest ct was to be ordered.  I could not find anything referring to the CT, please advise.    Thank you, Leonie Douglas Referral Coordinator  [facility] [phone] ext. [phone] Fax [phone]

## 2014-12-20 ENCOUNTER — Other Ambulatory Visit: Payer: Self-pay | Admitting: Internal Medicine

## 2014-12-20 DIAGNOSIS — Z87891 Personal history of nicotine dependence: Secondary | ICD-10-CM

## 2014-12-25 ENCOUNTER — Encounter: Payer: Self-pay | Admitting: Internal Medicine

## 2014-12-26 NOTE — Telephone Encounter (Signed)
Low dose CT Lung schd 2 wks, for recover from current pulm issues. Sched 01-08-15 at [employer]

## 2014-12-28 ENCOUNTER — Other Ambulatory Visit: Payer: Self-pay | Admitting: Internal Medicine

## 2015-01-08 ENCOUNTER — Ambulatory Visit (HOSPITAL_COMMUNITY)
Admission: RE | Admit: 2015-01-08 | Discharge: 2015-01-08 | Disposition: A | Discharge status: Home / Self Care | Payer: Medicare PPO | Source: Ambulatory Visit | Attending: Internal Medicine | Admitting: Internal Medicine

## 2015-01-08 DIAGNOSIS — Z122 Encounter for screening for malignant neoplasm of respiratory organs: Secondary | ICD-10-CM | POA: Insufficient documentation

## 2015-01-08 DIAGNOSIS — I700 Atherosclerosis of aorta: Secondary | ICD-10-CM | POA: Insufficient documentation

## 2015-01-08 DIAGNOSIS — J432 Centrilobular emphysema: Secondary | ICD-10-CM | POA: Insufficient documentation

## 2015-01-08 DIAGNOSIS — Z87891 Personal history of nicotine dependence: Secondary | ICD-10-CM | POA: Diagnosis not present

## 2015-01-08 DIAGNOSIS — R918 Other nonspecific abnormal finding of lung field: Secondary | ICD-10-CM | POA: Insufficient documentation

## 2015-01-11 ENCOUNTER — Other Ambulatory Visit: Payer: Self-pay | Admitting: Internal Medicine

## 2015-01-15 DIAGNOSIS — Z89511 Acquired absence of right leg below knee: Secondary | ICD-10-CM | POA: Diagnosis not present

## 2015-02-15 ENCOUNTER — Ambulatory Visit: Payer: Medicare PPO | Admitting: Internal Medicine

## 2015-02-15 ENCOUNTER — Encounter: Payer: Self-pay | Admitting: Internal Medicine

## 2015-02-15 DIAGNOSIS — Z0000 Encounter for general adult medical examination without abnormal findings: Secondary | ICD-10-CM | POA: Insufficient documentation

## 2015-02-15 DIAGNOSIS — K219 Gastro-esophageal reflux disease without esophagitis: Secondary | ICD-10-CM | POA: Insufficient documentation

## 2015-02-15 NOTE — Progress Notes (Signed)
Patient ID: Leslie Cardenas, male   DOB: [birth year], 74 y.o.   MRN: [id number]  [name]  W

## 2015-04-30 ENCOUNTER — Telehealth: Payer: Self-pay | Admitting: *Deleted

## 2015-04-30 MED ORDER — PREDNISONE 20 MG PO TABS: ORAL_TABLET | ORAL | Status: DC

## 2015-04-30 NOTE — Telephone Encounter (Signed)
Patient called and states he has had back pain x 3 days.  Per Dr Melford Aase, send in an RX for Prednisone 20 mg.

## 2015-05-08 ENCOUNTER — Encounter: Payer: Self-pay | Admitting: Family Medicine

## 2015-05-10 ENCOUNTER — Other Ambulatory Visit: Payer: Self-pay | Admitting: Family Medicine

## 2015-05-10 ENCOUNTER — Emergency Department (HOSPITAL_COMMUNITY)
Admission: EM | Admit: 2015-05-10 | Discharge: 2015-05-10 | Disposition: A | Discharge status: Home / Self Care | Payer: Medicare PPO | Attending: Emergency Medicine | Admitting: Emergency Medicine

## 2015-05-10 ENCOUNTER — Encounter (HOSPITAL_COMMUNITY): Payer: Self-pay

## 2015-05-10 ENCOUNTER — Emergency Department (HOSPITAL_COMMUNITY): Payer: Medicare PPO

## 2015-05-10 DIAGNOSIS — Z8639 Personal history of other endocrine, nutritional and metabolic disease: Secondary | ICD-10-CM | POA: Diagnosis not present

## 2015-05-10 DIAGNOSIS — M25551 Pain in right hip: Secondary | ICD-10-CM | POA: Diagnosis not present

## 2015-05-10 DIAGNOSIS — Z87891 Personal history of nicotine dependence: Secondary | ICD-10-CM | POA: Diagnosis not present

## 2015-05-10 DIAGNOSIS — I2510 Atherosclerotic heart disease of native coronary artery without angina pectoris: Secondary | ICD-10-CM | POA: Diagnosis not present

## 2015-05-10 DIAGNOSIS — E785 Hyperlipidemia, unspecified: Secondary | ICD-10-CM | POA: Diagnosis not present

## 2015-05-10 DIAGNOSIS — Z9889 Other specified postprocedural states: Secondary | ICD-10-CM | POA: Diagnosis not present

## 2015-05-10 DIAGNOSIS — J449 Chronic obstructive pulmonary disease, unspecified: Secondary | ICD-10-CM | POA: Insufficient documentation

## 2015-05-10 DIAGNOSIS — Z8719 Personal history of other diseases of the digestive system: Secondary | ICD-10-CM | POA: Diagnosis not present

## 2015-05-10 DIAGNOSIS — M5431 Sciatica, right side: Secondary | ICD-10-CM

## 2015-05-10 MED ORDER — HYDROCODONE-ACETAMINOPHEN 5-325 MG PO TABS: 1.0000 | ORAL_TABLET | Freq: Four times a day (QID) | ORAL | Status: DC | PRN

## 2015-05-10 MED ORDER — ACETAMINOPHEN 325 MG PO TABS
650.0000 mg | ORAL_TABLET | Freq: Once | ORAL | Status: AC
  Administered 2015-05-10: 650 mg via ORAL
  Filled 2015-05-10: qty 2

## 2015-05-10 NOTE — ED Notes (Addendum)
EDP at bedside  

## 2015-05-10 NOTE — Discharge Instructions (Signed)
Please follow-up with orthopedics early next week for reevaluation and further management. If any new or worsening signs or symptoms present please return for further evaluation. Please use Tylenol as needed for pain.

## 2015-05-10 NOTE — ED Provider Notes (Signed)
CSN: [id number]     Arrival date & time 05/10/15  1108 History   First MD Initiated Contact with Patient 05/10/15 1126     Chief Complaint  Patient presents with  . Hip Pain   HPI   74 year old male presents today with right hip pain. Patient reports that over the last week he's had right achy hip pain with radiation down into the right lower extremity. Patient reports this pain has come and gone previously and usually resolved with rest and Aleve. He reports taking Aleve for current episode with only minimal improvement in symptoms. Patient reports the pain is worse with flexion of the hip and ambulation. He reports relation causes radiation down to the right lower extremity. Patient denies any loss of distal sensation strength or motor function. Patient denies any upper or lower back pain, saddle anesthesia, or loss of control of bowel or bladder functioning. 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 Patient denies any redness, swelling, reduced range of motion of the hip, denies fever, chills, nausea, vomiting, abdominal pain.   Past Medical History  Diagnosis Date  . Coronary artery disease   . COPD (chronic obstructive pulmonary disease) ([place])   . 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 Hyperlipidemia   . Vitamin D deficiency   . Diverticulosis    Past Surgical History  Procedure Laterality Date  . Right leg amputation    . Cardiac catheterization  1983    No intervention performed.   . Colonoscopy w/ polypectomy  08/12/2010    Dr. Silvano Rusk  . Inguinal hernia repair      left  . Esophagogastroduodenoscopy    . Shoulder arthroscopy Left 2000   Family History  Problem Relation Age of Onset  . Hypertension Father   . Pancreatic cancer Mother   . Cancer Mother   . Colon cancer Brother   . Leukemia Brother   . Prostate cancer Brother   . Other Brother     polio  . Esophageal  cancer Neg Hx   . Rectal cancer Neg Hx   . Stomach cancer Neg Hx    Social History  Substance Use Topics  . Smoking status: Former Smoker -- 3.00 packs/day for 20 years    Types: Cigarettes    Quit date: 04/11/2000  . Smokeless tobacco: Never Used  . Alcohol Use: No    Review of Systems  All other systems reviewed and are negative.   Allergies  Review of patient's allergies indicates no known allergies.  Home Medications   Prior to Admission medications   Medication Sig Start Date End Date Taking? Authorizing Provider  benzonatate (TESSALON) 200 MG capsule Take 1 capsule (200 mg total) by mouth 3 (three) times daily as needed for cough. Patient not taking: Reported on 05/08/2015 12/18/14   Loma Sousa Forcucci, PA-C  fenofibrate micronized (LOFIBRA) 134 MG capsule Take one capsule by mouth one time daily for blood "fats". Patient not taking: Reported on 05/08/2015 04/02/14   Unk Pinto, MD  fluticasone [facility]) 50 MCG/ACT nasal spray Place 2 sprays into the nose daily. Patient not taking: Reported on 05/08/2015 10/17/12   Brand Males, MD  HYDROcodone-acetaminophen (NORCO) 5-325 MG tablet Take 1 tablet by mouth every 6 (six) hours as needed for moderate pain. 05/10/15   [name], MD  lansoprazole (PREVACID) 30 MG capsule Take 1 capsule (30 mg total) by mouth  daily. Patient not taking: Reported on 05/08/2015 04/07/12   Gatha Mayer, MD  nitroGLYCERIN (NITROSTAT) 0.4 MG SL tablet Place 1 tablet (0.4 mg total) under the tongue every 5 (five) minutes x 3 doses as needed for chest pain. 11/14/11 12/18/14  Dayna N Dunn, PA-C  Phenyleph-Promethazine-Cod 5-6.25-10 MG/5ML SYRP Take 5 mLs by mouth at bedtime as needed (severe cough). Patient not taking: Reported on 05/08/2015 12/18/14   Loma Sousa Forcucci, PA-C  predniSONE (DELTASONE) 20 MG tablet Take 1 tablet 3 times daily. Patient not taking: Reported on 05/08/2015 04/30/15   Unk Pinto, MD   BP 160/97 mmHg  Pulse 99   Temp(Src) 97.5 F (36.4 C) (Oral)  Resp 18  Ht 5\' 10"  (1.778 m)  Wt 65.772 kg  BMI 20.81 kg/m2  SpO2 96%   Physical Exam  Constitutional: He is oriented to person, place, and time. He appears well-developed and well-nourished.  HENT:  Head: Normocephalic and atraumatic.  Eyes: Conjunctivae are normal. Pupils are equal, round, and reactive to light. Right eye exhibits no discharge. Left eye exhibits no discharge. No scleral icterus.  Neck: Normal range of motion. No JVD present. No tracheal deviation present.  Cardiovascular: Regular rhythm, normal heart sounds and intact distal pulses.  Exam reveals no gallop and no friction rub.   No murmur heard. Pulmonary/Chest: Effort normal and breath sounds normal. No stridor. No respiratory distress. He has no wheezes. He has no rales. He exhibits no tenderness.  Abdominal: Soft. He exhibits no distension and no mass. There is no tenderness. There is no rebound.  Musculoskeletal: Normal range of motion. He exhibits tenderness. He exhibits no edema.  Tenderness to palpation of the right lateral hip, posterior hip. No obvious swelling, warmth to touch, redness. Full range of motion of the hip with minor pain to deep flexion. Remainder of extremity atraumatic, equal bilateral no signs of swelling or edema. Below-the-knee amputation. Patient has no CT or L-spine tenderness, no obvious deformities of the back, no soft tissue injuries, redness, swelling. Distal sensation strength or motor function intact  Neurological: He is alert and oriented to person, place, and time. Coordination normal.  Skin: Skin is warm and dry. No rash noted. No erythema. No pallor.  Psychiatric: He has a normal mood and affect. His behavior is normal. Judgment and thought content normal.  Nursing note and vitals reviewed.     ED Course  Procedures (including critical care time) Labs Review Labs Reviewed - No data to display  Imaging Review Dg Hip Unilat With Pelvis 2-3  Views Right  05/10/2015  CLINICAL DATA:  Posterior right hip pain after moving refrigerator 12 days ago. Initial encounter. EXAM: DG HIP (WITH OR WITHOUT PELVIS) 2-3V RIGHT COMPARISON:  None. FINDINGS: There is no evidence of hip fracture or dislocation. There is no evidence of arthropathy or focal bone lesion. Dextro curvature of the lower lumbar spine which may be positional. IMPRESSION: Negative. Electronically Signed   By: Monte Fantasia M.D.   On: 05/10/2015 12:42   I have personally reviewed and evaluated these images and lab results as part of my medical decision-making.   EKG Interpretation None      MDM   Final diagnoses:  Hip pain, right    Labs:   Imaging: DG hip unilateral with pelvis- negative  Consults:  Therapeutics:  Discharge Meds:   Assessment/Plan: 74 year old male presents today with right hip pain. Patient has history of the same that has resolved with over-the-counter medications. Today's presentation and has  persisted beyond normal timeframe. Patient has no signs of septic or gouty joint. He has no back pain, swelling, or any neurological deficits. Patient has no red flags that would necessitate further evaluation or management here in ED. 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 Patient has no acute abnormalities on x-ray. Due to patient's persistent symptoms he will be referred to orthopedics for further evaluation and management. He is instructed to return to the emergency room if any new or worsening signs or symptoms present. Patient verbalized understanding and agreement for today's plan and had no further questions or concerns at time of discharge. Patient is instructed use Tylenol as needed for pain.        Okey Regal, PA-C 05/10/15 1541  Leo Grosser, MD 05/12/15 [phone]

## 2015-05-10 NOTE — ED Notes (Signed)
Pt in xray during round  

## 2015-05-10 NOTE — ED Notes (Signed)
Pt. Coming from home c/o hip pain starting 12 days ago. Pt. And his buddies were moving a refrigerator and he woke up the next day with R hip pain. Pt. Thought it would resolve with rest and aleve, but he hasn't had any relief. Pt. sts pain shoots down his right leg as well. Pt. 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

## 2015-05-16 ENCOUNTER — Ambulatory Visit (INDEPENDENT_AMBULATORY_CARE_PROVIDER_SITE_OTHER): Payer: Medicare PPO | Admitting: Family Medicine

## 2015-05-16 ENCOUNTER — Encounter: Payer: Self-pay | Admitting: Family Medicine

## 2015-05-16 VITALS — BP 144/78 | HR 80 | Temp 98.1°F | Resp 18 | Ht 70.0 in | Wt 148.0 lb

## 2015-05-16 DIAGNOSIS — I10 Essential (primary) hypertension: Secondary | ICD-10-CM

## 2015-05-16 DIAGNOSIS — E785 Hyperlipidemia, unspecified: Secondary | ICD-10-CM | POA: Diagnosis not present

## 2015-05-16 DIAGNOSIS — J438 Other emphysema: Secondary | ICD-10-CM

## 2015-05-16 DIAGNOSIS — Z23 Encounter for immunization: Secondary | ICD-10-CM | POA: Diagnosis not present

## 2015-05-16 DIAGNOSIS — M5431 Sciatica, right side: Secondary | ICD-10-CM

## 2015-05-16 DIAGNOSIS — I2510 Atherosclerotic heart disease of native coronary artery without angina pectoris: Secondary | ICD-10-CM

## 2015-05-16 NOTE — Addendum Note (Signed)
Addended by: Shary Decamp B on: 05/16/2015 04:57 PM   Modules accepted: Orders

## 2015-05-16 NOTE — Progress Notes (Signed)
Subjective:    Patient ID: Leslie Cardenas, male    DOB: May 06, 1941, 74 y.o.   MRN: [id number]  HPI Was seen 12/2 in ER with right hip pain and was given prednisone.  Xrays of the hip at that time were normal.  He came by my office later that afternoon and explained to me that he was having pain radiating from the right side of his lower back down into the posterior aspect of his right hip and then shoot all the way down the posterior aspect of his right leg into his right knee. Pain stopped at knee but he has hx of BKA.  His symptoms sounded consistent with sciatica.  I asked him to try the prednisone taper pack given to him in the emergency room. I did give the patient prescription for hydrocodone/acetaminophen 5/325 one by mouth every 6 hours when necessary pain and asked him to follow-up with me in one week to see how he was doing.  Patient's pain is better. He has been able to wean back on the hydrocodone although he is still having pain. He is due for colonoscopy but he would like to defer this until after his pain is better. He is not due for a prostate check until February. Most recent fasting lipid panel revealed an LDL cholesterol 140. His goal LDL cholesterol be less than 70 given his history of a myocardial infarction in 1983. He also has a history of prediabetes, COPD, and hyperlipidemia. He is due for his flu shot. Pneumovax 23, Prevnar 13, and the shingles vaccine are up-to-date Past Medical History  Diagnosis Date  . Coronary artery disease   . COPD (chronic obstructive pulmonary disease) ([place])   . 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 Hyperlipidemia   . Vitamin D deficiency   . Diverticulosis    Past Surgical History  Procedure Laterality Date  . Right leg amputation    . Cardiac catheterization  1983    No intervention performed.   . Colonoscopy w/ polypectomy  08/12/2010    Dr. Silvano Rusk  . Inguinal hernia repair      left  . Esophagogastroduodenoscopy    .  Shoulder arthroscopy Left 2000   Current Outpatient Prescriptions on File Prior to Visit  Medication Sig Dispense Refill  . HYDROcodone-acetaminophen (NORCO) 5-325 MG tablet Take 1 tablet by mouth every 6 (six) hours as needed for moderate pain. 30 tablet 0  . fenofibrate micronized (LOFIBRA) 134 MG capsule Take one capsule by mouth one time daily for blood "fats". (Patient not taking: Reported on 05/08/2015) 30 capsule PRN  . lansoprazole (PREVACID) 30 MG capsule Take 1 capsule (30 mg total) by mouth daily. (Patient not taking: Reported on 05/08/2015) 30 capsule 11  . nitroGLYCERIN (NITROSTAT) 0.4 MG SL tablet Place 1 tablet (0.4 mg total) under the tongue every 5 (five) minutes x 3 doses as needed for chest pain. 25 tablet 4   No current facility-administered medications on file prior to visit.   No Known Allergies Social History   Social History  . Marital Status: Divorced    Spouse Name: N/A  . Number of Children: 4  . Years of Education: N/A   Occupational History  . retired    Social History Main Topics  . Smoking status: Former Smoker -- 3.00 packs/day for 20 years    Types: Cigarettes    Quit date: 04/11/2000  . Smokeless tobacco: Never Used  . Alcohol Use: No  .  Drug Use: No  . Sexual Activity: Not Currently   Other Topics Concern  . Not on file   Social History Narrative   Lives in [place], [place].    Family History  Problem Relation Age of Onset  . Hypertension Father   . Pancreatic cancer Mother   . Cancer Mother   . Colon cancer Brother   . Leukemia Brother   . Prostate cancer Brother   . Other Brother     polio  . Esophageal cancer Neg Hx   . Rectal cancer Neg Hx   . Stomach cancer Neg Hx       Review of Systems  All other systems reviewed and are negative.      Objective:   Physical Exam  Constitutional: He is oriented to person, place, and time. He appears well-developed and well-nourished. No distress.  Eyes: Conjunctivae and EOM are  normal. Pupils are equal, round, and reactive to light.  Neck: Neck supple. No JVD present. No thyromegaly present.  Cardiovascular: Normal rate, regular rhythm and normal heart sounds.   Pulmonary/Chest: Effort normal. He has wheezes.  Abdominal: Soft. Bowel sounds are normal. He exhibits no distension. There is no tenderness. There is no rebound.  Musculoskeletal: He exhibits no edema.  Lymphadenopathy:    He has no cervical adenopathy.  Neurological: He is alert and oriented to person, place, and time. He has normal reflexes. He displays normal reflexes. No cranial nerve deficit. He exhibits normal muscle tone. Coordination normal.  Skin: He is not diaphoretic.          Assessment & Plan:  ASCVD (arteriosclerotic cardiovascular disease)  Right sided sciatica  Benign essential HTN  HLD (hyperlipidemia)  Other emphysema (HCC) blood pressure is slightly elevated today. I have asked the patient check his blood pressure frequently over the next few weeks. If consistently greater than 140/90, I would start the patient on an angiotensin receptor blocker. I will avoid a beta blocker even though he has a history of coronary artery disease given his history of COPD. Given his LDL cholesterol being higher than 70 like him to return fasting for fasting lipid panel. If his LDL cholesterol is again higher than 70, I would like to get the patient started on a statin medication. He will receive his flu shot today. I will gladly refill his pain medication if necessary. I would like to wait 2 or 3 weeks to see if the pain will resolve on its own. If not proceed with an MRI of the lumbar spine. Schedule colonoscopy whenever he is ready. There is also a precancerous lesion on his left ear that needs cryotherapy. He can return at his convenience for that.

## 2015-05-17 ENCOUNTER — Other Ambulatory Visit: Payer: Self-pay | Admitting: Family Medicine

## 2015-05-17 ENCOUNTER — Other Ambulatory Visit: Payer: Medicare PPO

## 2015-05-17 DIAGNOSIS — I2510 Atherosclerotic heart disease of native coronary artery without angina pectoris: Secondary | ICD-10-CM | POA: Diagnosis not present

## 2015-05-17 DIAGNOSIS — M5431 Sciatica, right side: Secondary | ICD-10-CM

## 2015-05-17 LAB — LIPID PANEL
CHOL/HDL RATIO: 4.5 ratio (ref <=5.0)
Cholesterol: 202 mg/dL — ABNORMAL HIGH (ref 125–200)
HDL: 45 mg/dL (ref >=40)
LDL CALC: 130 mg/dL — AB (ref <130)
Triglycerides: 135 mg/dL (ref <150)
VLDL: 27 mg/dL (ref <30)

## 2015-05-17 LAB — COMPLETE METABOLIC PANEL WITH GFR
ALT: 17 U/L (ref 9–46)
AST: 17 U/L (ref 10–35)
Albumin: 3.7 g/dL (ref 3.6–5.1)
Alkaline Phosphatase: 58 U/L (ref 40–115)
BUN: 19 mg/dL (ref 7–25)
CO2: 28 mmol/L (ref 20–31)
Calcium: 9.8 mg/dL (ref 8.6–10.3)
Chloride: 103 mmol/L (ref 98–110)
Creat: 1.01 mg/dL (ref 0.70–1.18)
GFR, EST NON AFRICAN AMERICAN: 72 mL/min (ref >=60)
GFR, Est African American: 84 mL/min (ref >=60)
Glucose, Bld: 116 mg/dL — ABNORMAL HIGH (ref 70–99)
Potassium: 5.4 mmol/L — ABNORMAL HIGH (ref 3.5–5.3)
SODIUM: 138 mmol/L (ref 135–146)
Total Bilirubin: 0.3 mg/dL (ref 0.2–1.2)
Total Protein: 6.2 g/dL (ref 6.1–8.1)

## 2015-05-17 MED ORDER — HYDROCODONE-ACETAMINOPHEN 5-325 MG PO TABS: 1.0000 | ORAL_TABLET | Freq: Four times a day (QID) | ORAL | Status: DC | PRN

## 2015-05-18 LAB — CBC WITH DIFFERENTIAL/PLATELET
Basophils Absolute: 0.1 K/uL (ref 0.0–0.1)
Basophils Relative: 1 % (ref 0–1)
Eosinophils Absolute: 0 K/uL (ref 0.0–0.7)
Eosinophils Relative: 0 % (ref 0–5)
HCT: 44.8 % (ref 39.0–52.0)
Hemoglobin: 15.5 g/dL (ref 13.0–17.0)
Lymphocytes Relative: 6 % — ABNORMAL LOW (ref 12–46)
Lymphs Abs: 0.6 K/uL — ABNORMAL LOW (ref 0.7–4.0)
MCH: 33.1 pg (ref 26.0–34.0)
MCHC: 34.6 g/dL (ref 30.0–36.0)
MCV: 95.7 fL (ref 78.0–100.0)
MPV: 8.9 fL (ref 8.6–12.4)
Monocytes Absolute: 0.1 K/uL (ref 0.1–1.0)
Monocytes Relative: 1 % — ABNORMAL LOW (ref 3–12)
Neutro Abs: 8.7 K/uL — ABNORMAL HIGH (ref 1.7–7.7)
Neutrophils Relative %: 92 % — ABNORMAL HIGH (ref 43–77)
Platelets: 458 K/uL — ABNORMAL HIGH (ref 150–400)
RBC: 4.68 MIL/uL (ref 4.22–5.81)
RDW: 13.6 % (ref 11.5–15.5)
WBC: 9.5 K/uL (ref 4.0–10.5)

## 2015-05-22 ENCOUNTER — Other Ambulatory Visit: Payer: Self-pay | Admitting: Family Medicine

## 2015-05-22 DIAGNOSIS — I2510 Atherosclerotic heart disease of native coronary artery without angina pectoris: Secondary | ICD-10-CM

## 2015-05-22 DIAGNOSIS — E785 Hyperlipidemia, unspecified: Secondary | ICD-10-CM

## 2015-05-22 DIAGNOSIS — R799 Abnormal finding of blood chemistry, unspecified: Secondary | ICD-10-CM

## 2015-05-22 DIAGNOSIS — I10 Essential (primary) hypertension: Secondary | ICD-10-CM

## 2015-05-22 MED ORDER — ATORVASTATIN CALCIUM 40 MG PO TABS: 40.0000 mg | ORAL_TABLET | Freq: Every day | ORAL | Status: DC

## 2015-05-22 NOTE — Addendum Note (Signed)
Addended by: Shary Decamp B on: 05/22/2015 10:52 AM   Modules accepted: Orders

## 2015-06-07 ENCOUNTER — Ambulatory Visit (INDEPENDENT_AMBULATORY_CARE_PROVIDER_SITE_OTHER): Payer: Medicare PPO | Admitting: Family Medicine

## 2015-06-07 ENCOUNTER — Encounter: Payer: Self-pay | Admitting: Family Medicine

## 2015-06-07 VITALS — BP 120/74 | HR 78 | Temp 97.8°F | Resp 14 | Wt 148.0 lb

## 2015-06-07 DIAGNOSIS — I2510 Atherosclerotic heart disease of native coronary artery without angina pectoris: Secondary | ICD-10-CM

## 2015-06-07 DIAGNOSIS — D72829 Elevated white blood cell count, unspecified: Secondary | ICD-10-CM | POA: Diagnosis not present

## 2015-06-07 DIAGNOSIS — I10 Essential (primary) hypertension: Secondary | ICD-10-CM | POA: Diagnosis not present

## 2015-06-07 NOTE — Progress Notes (Signed)
Subjective:    Patient ID: Leslie Cardenas, male    DOB: [birth year], 74 y.o.   MRN: [id number]  HPI 05/16/15 Was seen 12/2 in ER with right hip pain and was given prednisone.  Xrays of the hip at that time were normal.  He came by my office later that afternoon and explained to me that he was having pain radiating from the right side of his lower back down into the posterior aspect of his right hip and then shoot all the way down the posterior aspect of his right leg into his right knee. Pain stopped at knee but he has hx of BKA.  His symptoms sounded consistent with sciatica.  I asked him to try the prednisone taper pack given to him in the emergency room. I did give the patient prescription for hydrocodone/acetaminophen 5/325 one by mouth every 6 hours when necessary pain and asked him to follow-up with me in one week to see how he was doing.  Patient's pain is better. He has been able to wean back on the hydrocodone although he is still having pain. He is due for colonoscopy but he would like to defer this until after his pain is better. He is not due for a prostate check until February. Most recent fasting lipid panel revealed an LDL cholesterol 140. His goal LDL cholesterol be less than 70 given his history of a myocardial infarction in 1983. He also has a history of prediabetes, COPD, and hyperlipidemia. He is due for his flu shot. Pneumovax 23, Prevnar 13, and the shingles vaccine are up-to-date.  At that time, my plan was: Blood pressure is slightly elevated today. I have asked the patient check his blood pressure frequently over the next few weeks. If consistently greater than 140/90, I would start the patient on an angiotensin receptor blocker. I will avoid a beta blocker even though he has a history of coronary artery disease given his history of COPD. Given his LDL cholesterol being higher than 70 like him to return fasting for fasting lipid panel. If his LDL cholesterol is again higher than 70, I  would like to get the patient started on a statin medication. He will receive his flu shot today. I will gladly refill his pain medication if necessary. I would like to wait 2 or 3 weeks to see if the pain will resolve on its own. If not proceed with an MRI of the lumbar spine. Schedule colonoscopy whenever he is ready. There is also a precancerous lesion on his left ear that needs cryotherapy. He can return at his convenience for that.  06/07/15 Office Visit on 05/16/2015  Component Date Value Ref Range Status  . WBC 05/17/2015 9.5  4.0 - 10.5 K/uL Final  . RBC 05/17/2015 4.68  4.22 - 5.81 MIL/uL Final  . Hemoglobin 05/17/2015 15.5  13.0 - 17.0 g/dL Final  . HCT 05/17/2015 44.8  39.0 - 52.0 % Final  . MCV 05/17/2015 95.7  78.0 - 100.0 fL Final  . MCH 05/17/2015 33.1  26.0 - 34.0 pg Final  . MCHC 05/17/2015 34.6  30.0 - 36.0 g/dL Final  . RDW 05/17/2015 13.6  11.5 - 15.5 % Final  . Platelets 05/17/2015 458* 150 - 400 K/uL Final  . MPV 05/17/2015 8.9  8.6 - 12.4 fL Final  . Neutrophils Relative % 05/17/2015 92* 43 - 77 % Final  . Neutro Abs 05/17/2015 8.7* 1.7 - 7.7 K/uL Final  . Lymphocytes Relative 05/17/2015 6* 12 -  46 % Final  . Lymphs Abs 05/17/2015 0.6* 0.7 - 4.0 K/uL Final  . Monocytes Relative 05/17/2015 1* 3 - 12 % Final  . Monocytes Absolute 05/17/2015 0.1  0.1 - 1.0 K/uL Final  . Eosinophils Relative 05/17/2015 0  0 - 5 % Final  . Eosinophils Absolute 05/17/2015 0.0  0.0 - 0.7 K/uL Final  . Basophils Relative 05/17/2015 1  0 - 1 % Final  . Basophils Absolute 05/17/2015 0.1  0.0 - 0.1 K/uL Final  . Smear Review 05/17/2015 Criteria for review not met   Final  . Sodium 05/17/2015 138  135 - 146 mmol/L Final  . Potassium 05/17/2015 5.4* 3.5 - 5.3 mmol/L Final   No visible hemolysis.  . Chloride 05/17/2015 103  98 - 110 mmol/L Final  . CO2 05/17/2015 28  20 - 31 mmol/L Final  . Glucose, Bld 05/17/2015 116* 70 - 99 mg/dL Final  . BUN 05/17/2015 19  7 - 25 mg/dL Final  . Creat  05/17/2015 1.01  0.70 - 1.18 mg/dL Final  . Total Bilirubin 05/17/2015 0.3  0.2 - 1.2 mg/dL Final  . Alkaline Phosphatase 05/17/2015 58  40 - 115 U/L Final  . AST 05/17/2015 17  10 - 35 U/L Final  . ALT 05/17/2015 17  9 - 46 U/L Final  . Total Protein 05/17/2015 6.2  6.1 - 8.1 g/dL Final  . Albumin 05/17/2015 3.7  3.6 - 5.1 g/dL Final  . Calcium 05/17/2015 9.8  8.6 - 10.3 mg/dL Final  . GFR, Est African American 05/17/2015 84  >=60 mL/min Final  . GFR, Est Non African American 05/17/2015 72  >=60 mL/min Final   Comment:   The estimated GFR is a calculation valid for adults (>=35 years old) that uses the CKD-EPI algorithm to adjust for age and sex. It is   not to be used for children, pregnant women, hospitalized patients,    patients on dialysis, or with rapidly changing kidney function. According to the NKDEP, eGFR >89 is normal, 60-89 shows mild impairment, 30-59 shows moderate impairment, 15-29 shows severe impairment and <15 is ESRD.     Marland Kitchen Cholesterol 05/17/2015 202* 125 - 200 mg/dL Final  . Triglycerides 05/17/2015 135  <150 mg/dL Final  . HDL 05/17/2015 45  >=40 mg/dL Final  . Total CHOL/HDL Ratio 05/17/2015 4.5  <=5.0 Ratio Final  . VLDL 05/17/2015 27  <30 mg/dL Final  . LDL Cholesterol 05/17/2015 130* <130 mg/dL Final   Comment:   Total Cholesterol/HDL Ratio:CHD Risk                        Coronary Heart Disease Risk Table                                        Men       Women          1/2 Average Risk              3.4        3.3              Average Risk              5.0        4.4           2X Average Risk  9.6        7.1           3X Average Risk             23.4       11.0 Use the calculated Patient Ratio above and the CHD Risk table  to determine the patient's CHD Risk.     As shown above, LDL was elevated at 130 and I recommended lipitor 40 mg poqday for that.    Patient's blood pressure is much better now at 120/74. He states that his low back pain is  essentially resolved. He is very seldomly having to take the pain pills. Lab work did reveal a normal white blood cell count and elevated percentage of neutrophils. However he had recently been on prednisone and he has been taking prednisone chronically for dry skin. He is subsequently discontinued prednisone as he is able to manage the itching on his skin with a combination of dose soap and petroleum jelly. Past Medical History  Diagnosis Date  . Coronary artery disease   . COPD (chronic obstructive pulmonary disease) ([place])   . 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 Hyperlipidemia   . Vitamin D deficiency   . Diverticulosis    Past Surgical History  Procedure Laterality Date  . Right leg amputation    . Cardiac catheterization  1983    No intervention performed.   . Colonoscopy w/ polypectomy  08/12/2010    Dr. Silvano Rusk  . Inguinal hernia repair      left  . Esophagogastroduodenoscopy    . Shoulder arthroscopy Left 2000   Current Outpatient Prescriptions on File Prior to Visit  Medication Sig Dispense Refill  . atorvastatin (LIPITOR) 40 MG tablet Take 1 tablet (40 mg total) by mouth daily. 90 tablet 3  . fenofibrate micronized (LOFIBRA) 134 MG capsule Take one capsule by mouth one time daily for blood "fats". (Patient not taking: Reported on 05/08/2015) 30 capsule PRN  . HYDROcodone-acetaminophen (NORCO) 5-325 MG tablet Take 1 tablet by mouth every 6 (six) hours as needed for moderate pain. 30 tablet 0  . lansoprazole (PREVACID) 30 MG capsule Take 1 capsule (30 mg total) by mouth daily. (Patient not taking: Reported on 05/08/2015) 30 capsule 11  . nitroGLYCERIN (NITROSTAT) 0.4 MG SL tablet Place 1 tablet (0.4 mg total) under the tongue every 5 (five) minutes x 3 doses as needed for chest pain. 25 tablet 4  . predniSONE (DELTASONE) 10 MG tablet Take 5 mg by mouth every other day.      No current facility-administered medications on file prior to visit.   No Known  Allergies Social History   Social History  . Marital Status: Divorced    Spouse Name: N/A  . Number of Children: 4  . Years of Education: N/A   Occupational History  . retired    Social History Main Topics  . Smoking status: Former Smoker -- 3.00 packs/day for 20 years    Types: Cigarettes    Quit date: 04/11/2000  . Smokeless tobacco: Never Used  . Alcohol Use: No  . Drug Use: No  . Sexual Activity: Not Currently   Other Topics Concern  . Not on file   Social History Narrative   Lives in [place], [place].    Family History  Problem Relation Age of Onset  . Hypertension Father   . Pancreatic cancer Mother   . Cancer Mother   . Colon cancer Brother   .  Leukemia Brother   . Prostate cancer Brother   . Other Brother     polio  . Esophageal cancer Neg Hx   . Rectal cancer Neg Hx   . Stomach cancer Neg Hx       Review of Systems  All other systems reviewed and are negative.      Objective:   Physical Exam  Constitutional: He is oriented to person, place, and time. He appears well-developed and well-nourished. No distress.  Eyes: Conjunctivae and EOM are normal. Pupils are equal, round, and reactive to light.  Neck: Neck supple. No JVD present. No thyromegaly present.  Cardiovascular: Normal rate, regular rhythm and normal heart sounds.   Pulmonary/Chest: Effort normal. He has wheezes.  Abdominal: Soft. Bowel sounds are normal. He exhibits no distension. There is no tenderness. There is no rebound.  Musculoskeletal: He exhibits no edema.  Lymphadenopathy:    He has no cervical adenopathy.  Neurological: He is alert and oriented to person, place, and time. He has normal reflexes. No cranial nerve deficit. He exhibits normal muscle tone. Coordination normal.  Skin: He is not diaphoretic.          Assessment & Plan:  ASCVD (arteriosclerotic cardiovascular disease)  Benign essential HTN  Leukocytosis   blood pressure is excellent today. I would like  the patient to return fasting in March to check a CMP, fasting lipid panel. Goal LDL cholesterol is less than 70. His blood pressure is now at goal. I would like to recheck a CBC in March. If the patient continues to have leukocytosis and abnormal neutrophil counts off prednisone, we may need to proceed with the peripheral smear to evaluate for bone marrow abnormalities

## 2015-08-12 ENCOUNTER — Encounter: Payer: Self-pay | Admitting: Internal Medicine

## 2015-09-04 ENCOUNTER — Ambulatory Visit (INDEPENDENT_AMBULATORY_CARE_PROVIDER_SITE_OTHER): Payer: Medicare PPO | Admitting: Physician Assistant

## 2015-09-04 ENCOUNTER — Encounter: Payer: Self-pay | Admitting: Physician Assistant

## 2015-09-04 VITALS — BP 132/90 | HR 76 | Temp 98.0°F | Resp 18 | Wt 145.0 lb

## 2015-09-04 DIAGNOSIS — J209 Acute bronchitis, unspecified: Secondary | ICD-10-CM | POA: Diagnosis not present

## 2015-09-04 MED ORDER — AZITHROMYCIN 250 MG PO TABS
ORAL_TABLET | ORAL | Status: DC
Start: 2015-09-04 — End: 2016-02-12

## 2015-09-04 MED ORDER — ALBUTEROL SULFATE HFA 108 (90 BASE) MCG/ACT IN AERS: 2.0000 | INHALATION_SPRAY | Freq: Four times a day (QID) | RESPIRATORY_TRACT | Status: DC | PRN

## 2015-09-04 MED ORDER — PREDNISONE 20 MG PO TABS: 20.0000 mg | ORAL_TABLET | Freq: Every day | ORAL | Status: DC

## 2015-09-04 NOTE — Progress Notes (Signed)
Patient ID: Leslie Cardenas MRN: [id number], DOB: 23-Mar-1941, 75 y.o. Date of Encounter: 09/04/2015, 10:32 AM    Chief Complaint:  Chief Complaint  Patient presents with  . sick x 2 weeks    chest cold     HPI: 75 y.o. year old white male presents with above.   Says that he works multiple jobs doing Therapist, occupational and this illness is making him feel weak and he needs to be getting his energy back up. Says that at the beginning of the illness he had some congestion in his head and nose but that has all resolved and now it is all just chest congestion. No sore throat. No earache. No fevers or chills. Having cough productive of thick dark phlegm.     Home Meds:   Outpatient Prescriptions Prior to Visit  Medication Sig Dispense Refill  . atorvastatin (LIPITOR) 40 MG tablet Take 1 tablet (40 mg total) by mouth daily. 90 tablet 3  . nitroGLYCERIN (NITROSTAT) 0.4 MG SL tablet Place 1 tablet (0.4 mg total) under the tongue every 5 (five) minutes x 3 doses as needed for chest pain. 25 tablet 4  . HYDROcodone-acetaminophen (NORCO) 5-325 MG tablet Take 1 tablet by mouth every 6 (six) hours as needed for moderate pain. (Patient not taking: Reported on 09/04/2015) 30 tablet 0  . predniSONE (DELTASONE) 10 MG tablet Take 5 mg by mouth every other day. Reported on 09/04/2015     No facility-administered medications prior to visit.    Allergies: No Known Allergies    Review of Systems: See HPI for pertinent ROS. All other ROS negative.    Physical Exam: Blood pressure 132/90, pulse 76, temperature 98 F (36.7 C), temperature source Oral, resp. rate 18, weight 145 lb (65.772 kg)., Body mass index is 20.81 kg/(m^2). General:  WNWD WM. Appears in no acute distress. HEENT: Normocephalic, atraumatic, eyes without discharge, sclera non-icteric, nares are without discharge. Bilateral auditory canals clear, TM's are without perforation, pearly grey and translucent with reflective cone  of light bilaterally. Oral cavity moist, posterior pharynx without exudate, erythema, peritonsillar abscess.  Neck: Supple. No thyromegaly. No lymphadenopathy. Lungs: He has mild harsh wheezes throughout bilaterally.  Oxygen Saturation 91% on RA No accessory muscle use.  No dyspnea.  Heart: Regular rhythm. No murmurs, rubs, or gallops. Msk:  Strength and tone normal for age. Extremities/Skin: Warm and dry.  Neuro: Alert and oriented X 3. Moves all extremities spontaneously. Gait is normal. CNII-XII grossly in tact. Psych:  Responds to questions appropriately with a normal affect.     ASSESSMENT AND PLAN:  75 y.o. year old male with  1. Acute bronchitis, unspecified organism He is to take antibiotic and prednisone as directed below. To use the albuterol 2 puffs every 4 hours as needed for wheezing. He is to follow-up with [place] if his symptoms worsen at all or if they are not back to baseline in one week. He states that he quit smoking 2001. - azithromycin (ZITHROMAX) 250 MG tablet; Day 1: Take 2 daily. Days 2-5: Take 1 daily.  Dispense: 6 tablet; Refill: 0 - predniSONE (DELTASONE) 20 MG tablet; Take 1 tablet (20 mg total) by mouth daily with breakfast.  Dispense: 5 tablet; Refill: 0 - albuterol (PROVENTIL HFA;VENTOLIN HFA) 108 (90 Base) MCG/ACT inhaler; Inhale 2 puffs into the lungs every 6 (six) hours as needed for wheezing or shortness of breath.  Dispense: 1 Inhaler; Refill: 0   Signed, [address] [place], [place], [place]  09/04/2015 10:32 AM

## 2015-10-07 ENCOUNTER — Other Ambulatory Visit: Payer: Self-pay | Admitting: Internal Medicine

## 2016-02-12 ENCOUNTER — Encounter: Payer: Self-pay | Admitting: Family Medicine

## 2016-02-12 ENCOUNTER — Ambulatory Visit (INDEPENDENT_AMBULATORY_CARE_PROVIDER_SITE_OTHER): Payer: Medicare PPO | Admitting: Family Medicine

## 2016-02-12 VITALS — BP 140/70 | HR 66 | Temp 97.7°F | Resp 16 | Ht 70.0 in | Wt 142.0 lb

## 2016-02-12 DIAGNOSIS — J42 Unspecified chronic bronchitis: Secondary | ICD-10-CM

## 2016-02-12 DIAGNOSIS — M7021 Olecranon bursitis, right elbow: Secondary | ICD-10-CM

## 2016-02-12 DIAGNOSIS — Z79899 Other long term (current) drug therapy: Secondary | ICD-10-CM | POA: Diagnosis not present

## 2016-02-12 DIAGNOSIS — M71121 Other infective bursitis, right elbow: Secondary | ICD-10-CM

## 2016-02-12 LAB — CBC WITH DIFFERENTIAL/PLATELET
Basophils Absolute: 0 cells/uL (ref 0–200)
Basophils Relative: 0 %
EOS PCT: 4 %
Eosinophils Absolute: 468 cells/uL (ref 15–500)
HCT: 40.6 % (ref 38.5–50.0)
Hemoglobin: 14.2 g/dL (ref 13.0–17.0)
Lymphocytes Relative: 10 %
Lymphs Abs: 1170 cells/uL (ref 850–3900)
MCH: 33.9 pg — AB (ref 27.0–33.0)
MCHC: 35 g/dL (ref 32.0–36.0)
MCV: 96.9 fL (ref 80.0–100.0)
MONOS PCT: 10 %
MPV: 9.1 fL (ref 7.5–12.5)
Monocytes Absolute: 1170 cells/uL — ABNORMAL HIGH (ref 200–950)
Neutro Abs: 8892 cells/uL — ABNORMAL HIGH (ref 1500–7800)
Neutrophils Relative %: 76 %
Platelets: 272 10*3/uL (ref 140–400)
RBC: 4.19 MIL/uL — AB (ref 4.20–5.80)
RDW: 13.4 % (ref 11.0–15.0)
WBC: 11.7 10*3/uL — AB (ref 3.8–10.8)

## 2016-02-12 LAB — COMPREHENSIVE METABOLIC PANEL
ALT: 16 U/L (ref 9–46)
AST: 20 U/L (ref 10–35)
Albumin: 4.1 g/dL (ref 3.6–5.1)
Alkaline Phosphatase: 74 U/L (ref 40–115)
BUN: 15 mg/dL (ref 7–25)
CO2: 27 mmol/L (ref 20–31)
CREATININE: 1.15 mg/dL (ref 0.70–1.18)
Calcium: 10 mg/dL (ref 8.6–10.3)
Chloride: 105 mmol/L (ref 98–110)
Glucose, Bld: 86 mg/dL (ref 70–99)
Potassium: 5.5 mmol/L — ABNORMAL HIGH (ref 3.5–5.3)
SODIUM: 140 mmol/L (ref 135–146)
TOTAL PROTEIN: 6.6 g/dL (ref 6.1–8.1)
Total Bilirubin: 0.6 mg/dL (ref 0.2–1.2)

## 2016-02-12 LAB — URIC ACID: URIC ACID, SERUM: 7.8 mg/dL (ref 4.0–8.0)

## 2016-02-12 MED ORDER — SULFAMETHOXAZOLE-TRIMETHOPRIM 800-160 MG PO TABS: 1.0000 | ORAL_TABLET | Freq: Two times a day (BID) | ORAL | 0 refills | Status: DC

## 2016-02-12 MED ORDER — PREDNISONE 10 MG PO TABS: ORAL_TABLET | ORAL | 1 refills | Status: DC

## 2016-02-12 NOTE — Assessment & Plan Note (Signed)
He has a very interesting regimen for his breathing he has seen pulmonary in the past it seems that this prednisone every other day was maintained by his previous primary care provider. I called the pharmacy he had not been getting regularly therefore I doubt he is actually taking this consistently likely only when his breathing starts to act up. He will also continue with the albuterol inhaler for now.  With regards to his swollen elbow I think this is maybe an infected bursitis he seems to have either a bug bite or some kind of abrasion right at the center of the area swelling. On the check his labs today he is not febrile otherwise vitals are stable. We will start Bactrim double strength 1 tablet twice a day. He will have a recheck in 48 hours.

## 2016-02-12 NOTE — Patient Instructions (Signed)
Take antibiotics Take prednisone F/U Friday for recheck  Go to ER if this gets worse

## 2016-02-12 NOTE — Progress Notes (Signed)
Subjective:    Patient ID: Leslie Cardenas, male    DOB: [birth year], 75 y.o.   MRN: [id number]  Patient presents for R Elbow Edema/ Pain (x2 days- reports that he was out mowing and then noted increased pain, swelling, and warmth to touch) 75 year old male with history of coronary artery disease currently on Lipitor presents with swelling pain of his right elbow. Which started this morning, yesterday he had pain in his elbow does not remember any injury vbut works outdoors , not sure if he was bitten, this morning worke up with pain, swelling, redness in elbow. No fever, but had chills last night Has Norco at home but has not taken Also requested refill on his chronic prednisone- he takes every other day for his COPD and ? Allergies. Also has albuterol As well. He's been tried on other inhalers in the past and do not see where they were all discontinued but he is now down to just albuterol in the prednisone every other day TDAP in 2013     Review Of Systems:  GEN- denies fatigue, fever, weight loss,weakness, recent illness HEENT- denies eye drainage, change in vision, nasal discharge, CVS- denies chest pain, palpitations RESP- denies SOB, cough, wheeze ABD- denies N/V, change in stools, abd pain GU- denies dysuria, hematuria, dribbling, incontinence MSK- +joint pain, muscle aches, injury Neuro- denies headache, dizziness, syncope, seizure activity       Objective:    BP 140/70 (BP Location: Left Arm, Patient Position: Sitting, Cuff Size: Large)   Pulse 66   Temp 97.7 F (36.5 C) (Oral)   Resp 16   Ht 5\' 10"  (1.778 m)   Wt 142 lb (64.4 kg)   BMI 20.37 kg/m  GEN- NAD, alert and oriented x3 HEENT- PERRL, EOMI, non injected sclera, pink conjunctiva, MMM, oropharynx clear CVS- RRR, no murmur RESP-few scattered wheeze, normal WOB, no rales  EXT- Right elbow, +mild swelling over olecranon, mild TTP +warmth, scab at center with mild erythema, no isoloated fluctuant areas, +mild  erythema  Pulses- Radial, DP- 2+        Assessment & Plan:      Problem List Items Addressed This Visit    COPD (chronic obstructive pulmonary disease) ([place])    He has a very interesting regimen for his breathing he has seen pulmonary in the past it seems that this prednisone every other day was maintained by his previous primary care provider. I called the pharmacy he had not been getting regularly therefore I doubt he is actually taking this consistently likely only when his breathing starts to act up. He will also continue with the albuterol inhaler for now.  With regards to his swollen elbow I think this is maybe an infected bursitis he seems to have either a bug bite or some kind of abrasion right at the center of the area swelling. On the check his labs today he is not febrile otherwise vitals are stable. We will start Bactrim double strength 1 tablet twice a day. He will have a recheck in 48 hours.      Relevant Medications   predniSONE (DELTASONE) 10 MG tablet    Other Visit Diagnoses    Infected olecranon bursa, right    -  Primary   Relevant Medications   sulfamethoxazole-trimethoprim (BACTRIM DS,SEPTRA DS) 800-160 MG tablet   Other Relevant Orders   CBC with Differential/Platelet   Comprehensive metabolic panel   Uric Acid      Note: This dictation was prepared  with Dragon dictation along with smaller phrase technology. Any transcriptional errors that result from this process are unintentional.

## 2016-02-14 ENCOUNTER — Ambulatory Visit (INDEPENDENT_AMBULATORY_CARE_PROVIDER_SITE_OTHER): Payer: Medicare PPO | Admitting: Family Medicine

## 2016-02-14 ENCOUNTER — Encounter: Payer: Self-pay | Admitting: Family Medicine

## 2016-02-14 VITALS — BP 126/70 | HR 72 | Temp 98.0°F | Resp 18 | Ht 70.0 in | Wt 142.0 lb

## 2016-02-14 DIAGNOSIS — E876 Hypokalemia: Secondary | ICD-10-CM | POA: Diagnosis not present

## 2016-02-14 DIAGNOSIS — M7021 Olecranon bursitis, right elbow: Secondary | ICD-10-CM

## 2016-02-14 DIAGNOSIS — M71121 Other infective bursitis, right elbow: Secondary | ICD-10-CM

## 2016-02-14 DIAGNOSIS — E875 Hyperkalemia: Secondary | ICD-10-CM

## 2016-02-14 DIAGNOSIS — M25521 Pain in right elbow: Secondary | ICD-10-CM | POA: Diagnosis not present

## 2016-02-14 LAB — BASIC METABOLIC PANEL
BUN: 15 mg/dL (ref 7–25)
CHLORIDE: 104 mmol/L (ref 98–110)
CO2: 24 mmol/L (ref 20–31)
Calcium: 9.5 mg/dL (ref 8.6–10.3)
Creat: 1.35 mg/dL — ABNORMAL HIGH (ref 0.70–1.18)
Glucose, Bld: 86 mg/dL (ref 70–99)
Potassium: 4.7 mmol/L (ref 3.5–5.3)
Sodium: 139 mmol/L (ref 135–146)

## 2016-02-14 NOTE — Patient Instructions (Signed)
COntinue antibiotics  Go to orthopedics today  F/U as needed

## 2016-02-14 NOTE — Progress Notes (Signed)
   Subjective:    Patient ID: Leslie Cardenas, male    DOB: July 22, 1940, 75 y.o.   MRN: [id number]  Patient presents for Follow-up (R elbow pain) Patient to follow-up right olecranon bursitis which I presume is infected. He was seen 48 hours ago at that time he presented with pain in his elbow but no particular trauma to it with than 24 hours he is swelling redness and warmth. He does not have any fever has subjective chills one time. His white blood cell count was elevated at 11.7. He was started on Bactrim. He also had mildly elevated potassium at 5.5 which I think was due to hemolysis based on the difficulty getting his blood the other day. Still denies any fevers not had any further chills but still has pain and warmth in his elbow. He has been icing and keeping elevated but there appears to be more swelling.    Review Of Systems:  GEN- denies fatigue, fever, weight loss,weakness, recent illness HEENT- denies eye drainage, change in vision, nasal discharge, CVS- denies chest pain, palpitations RESP- denies SOB, cough, wheeze ABD- denies N/V, change in stools, abd pain GU- denies dysuria, hematuria, dribbling, incontinence MSK- + joint pain, muscle aches, injury Neuro- denies headache, dizziness, syncope, seizure activity       Objective:    BP 126/70 (BP Location: Left Arm, Patient Position: Sitting, Cuff Size: Normal)   Pulse 72   Temp 98 F (36.7 C) (Oral)   Resp 18   Ht 5\' 10"  (1.778 m)   Wt 142 lb (64.4 kg)   BMI 20.37 kg/m  GEN- NAD, alert and oriented x3 MSK- RIght elbow - increased swelling of bursa, TTP , erythema over area of swelling only, has decresaed compared to initial visit, scab at center of elbow, able to extend, No swelling, redness, pain of upper arm or forearm Pulses- Radial  2+        Assessment & Plan:      Problem List Items Addressed This Visit    None    Visit Diagnoses    Infected olecranon bursa, right    -  Primary   Concern for infected  bursistis based on history, while erythema has improved, swelling is more promient over bursa, WBC also elevated, recommend aspiration will Send to orthopedics urgently today    Relevant Orders   Ambulatory referral to Orthopedic Surgery   Hypokalemia     - no current K supplement I think this is hemolysis , recheck today    Relevant Orders   Basic metabolic panel      Note: This dictation was prepared with Dragon dictation along with smaller phrase technology. Any transcriptional errors that result from this process are unintentional.

## 2016-02-17 DIAGNOSIS — M7021 Olecranon bursitis, right elbow: Secondary | ICD-10-CM | POA: Diagnosis not present

## 2016-04-13 ENCOUNTER — Emergency Department (HOSPITAL_COMMUNITY)
Admission: EM | Admit: 2016-04-13 | Discharge: 2016-04-13 | Disposition: A | Discharge status: Home / Self Care | Payer: Medicare PPO | Attending: Emergency Medicine | Admitting: Emergency Medicine

## 2016-04-13 ENCOUNTER — Emergency Department (HOSPITAL_COMMUNITY): Payer: Medicare PPO

## 2016-04-13 ENCOUNTER — Encounter (HOSPITAL_COMMUNITY): Payer: Self-pay | Admitting: Emergency Medicine

## 2016-04-13 DIAGNOSIS — Y999 Unspecified external cause status: Secondary | ICD-10-CM | POA: Diagnosis not present

## 2016-04-13 DIAGNOSIS — S39012A Strain of muscle, fascia and tendon of lower back, initial encounter: Secondary | ICD-10-CM | POA: Diagnosis not present

## 2016-04-13 DIAGNOSIS — M25511 Pain in right shoulder: Secondary | ICD-10-CM | POA: Diagnosis not present

## 2016-04-13 DIAGNOSIS — S20211A Contusion of right front wall of thorax, initial encounter: Secondary | ICD-10-CM | POA: Diagnosis not present

## 2016-04-13 DIAGNOSIS — Z87891 Personal history of nicotine dependence: Secondary | ICD-10-CM | POA: Insufficient documentation

## 2016-04-13 DIAGNOSIS — I2510 Atherosclerotic heart disease of native coronary artery without angina pectoris: Secondary | ICD-10-CM | POA: Insufficient documentation

## 2016-04-13 DIAGNOSIS — Y939 Activity, unspecified: Secondary | ICD-10-CM | POA: Diagnosis not present

## 2016-04-13 DIAGNOSIS — R079 Chest pain, unspecified: Secondary | ICD-10-CM | POA: Diagnosis not present

## 2016-04-13 DIAGNOSIS — Y92410 Unspecified street and highway as the place of occurrence of the external cause: Secondary | ICD-10-CM | POA: Insufficient documentation

## 2016-04-13 DIAGNOSIS — S3992XA Unspecified injury of lower back, initial encounter: Secondary | ICD-10-CM | POA: Diagnosis present

## 2016-04-13 DIAGNOSIS — M47816 Spondylosis without myelopathy or radiculopathy, lumbar region: Secondary | ICD-10-CM | POA: Diagnosis not present

## 2016-04-13 DIAGNOSIS — T148XXA Other injury of unspecified body region, initial encounter: Secondary | ICD-10-CM | POA: Diagnosis not present

## 2016-04-13 DIAGNOSIS — I10 Essential (primary) hypertension: Secondary | ICD-10-CM | POA: Insufficient documentation

## 2016-04-13 DIAGNOSIS — J449 Chronic obstructive pulmonary disease, unspecified: Secondary | ICD-10-CM | POA: Diagnosis not present

## 2016-04-13 MED ORDER — TRAMADOL HCL 50 MG PO TABS: 50.0000 mg | ORAL_TABLET | Freq: Four times a day (QID) | ORAL | 0 refills | Status: DC | PRN

## 2016-04-13 MED ORDER — OXYCODONE-ACETAMINOPHEN 5-325 MG PO TABS
1.0000 | ORAL_TABLET | Freq: Once | ORAL | Status: AC
  Administered 2016-04-13: 1 via ORAL
  Filled 2016-04-13: qty 1
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
## 2016-04-13 NOTE — Discharge Instructions (Signed)
Take acetaminophen or ibuprofen for less severe pain. °
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
## 2016-04-16 ENCOUNTER — Ambulatory Visit (INDEPENDENT_AMBULATORY_CARE_PROVIDER_SITE_OTHER): Payer: Medicare PPO | Admitting: Family Medicine

## 2016-04-16 ENCOUNTER — Encounter: Payer: Self-pay | Admitting: Family Medicine

## 2016-04-16 VITALS — BP 132/70 | HR 76 | Temp 97.6°F | Resp 16 | Ht 70.0 in | Wt 141.0 lb

## 2016-04-16 DIAGNOSIS — M545 Low back pain, unspecified: Secondary | ICD-10-CM
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
## 2016-04-21 ENCOUNTER — Telehealth: Payer: Self-pay | Admitting: Family Medicine

## 2016-04-21 NOTE — Telephone Encounter (Signed)
Pt called back and appt made

## 2016-04-21 NOTE — Telephone Encounter (Signed)
Pt called [facility] that he is having HA's off & on and would like a call back.  Called pt back no answer but did leave message that is still having ha's needs f/u appt.

## 2016-04-22 ENCOUNTER — Ambulatory Visit (HOSPITAL_COMMUNITY)
Admission: RE | Admit: 2016-04-22 | Discharge: 2016-04-22 | Disposition: A | Discharge status: Home / Self Care | Payer: Medicare PPO | Source: Ambulatory Visit | Attending: Family Medicine | Admitting: Family Medicine

## 2016-04-22 ENCOUNTER — Encounter: Payer: Self-pay | Admitting: Family Medicine

## 2016-04-22 ENCOUNTER — Ambulatory Visit (INDEPENDENT_AMBULATORY_CARE_PROVIDER_SITE_OTHER): Payer: Medicare PPO | Admitting: Family Medicine

## 2016-04-22 ENCOUNTER — Encounter (HOSPITAL_COMMUNITY): Payer: Self-pay

## 2016-04-22 DIAGNOSIS — M542 Cervicalgia: Secondary | ICD-10-CM | POA: Diagnosis not present

## 2016-04-22 DIAGNOSIS — M47892 Other spondylosis, cervical region: Secondary | ICD-10-CM | POA: Diagnosis not present

## 2016-04-22 DIAGNOSIS — M1288 Other specific arthropathies, not elsewhere classified, other specified site: Secondary | ICD-10-CM | POA: Insufficient documentation

## 2016-04-22 DIAGNOSIS — S134XXD Sprain of ligaments of cervical spine, subsequent encounter: Secondary | ICD-10-CM

## 2016-04-22 DIAGNOSIS — G319 Degenerative disease of nervous system, unspecified: Secondary | ICD-10-CM | POA: Insufficient documentation

## 2016-04-22 DIAGNOSIS — G44319 Acute post-traumatic headache, not intractable: Secondary | ICD-10-CM | POA: Insufficient documentation

## 2016-04-22 DIAGNOSIS — R51 Headache: Secondary | ICD-10-CM | POA: Diagnosis not present

## 2016-04-22 NOTE — Patient Instructions (Signed)
Get CT scan and Xray done F/U PENDING RESULTS
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
## 2016-06-11 ENCOUNTER — Encounter (INDEPENDENT_AMBULATORY_CARE_PROVIDER_SITE_OTHER): Payer: Self-pay | Admitting: Orthopaedic Surgery

## 2016-06-11 ENCOUNTER — Ambulatory Visit (INDEPENDENT_AMBULATORY_CARE_PROVIDER_SITE_OTHER): Payer: Medicare PPO | Admitting: Orthopaedic Surgery

## 2016-06-11 DIAGNOSIS — M5441 Lumbago with sciatica, right side: Secondary | ICD-10-CM | POA: Diagnosis not present

## 2016-06-11 MED ORDER — BACLOFEN 10 MG PO TABS: 10.0000 mg | ORAL_TABLET | Freq: Three times a day (TID) | ORAL | 2 refills | Status: DC | PRN

## 2016-06-11 MED ORDER — PREDNISONE 10 MG (21) PO TBPK: ORAL_TABLET | ORAL | 0 refills | Status: DC
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
## 2016-06-16 ENCOUNTER — Ambulatory Visit (INDEPENDENT_AMBULATORY_CARE_PROVIDER_SITE_OTHER): Payer: Medicare PPO | Admitting: Family Medicine

## 2016-06-16 ENCOUNTER — Encounter: Payer: Self-pay | Admitting: Family Medicine

## 2016-06-16 VITALS — BP 150/74 | HR 82 | Temp 98.3°F | Resp 18 | Ht 70.0 in | Wt 141.0 lb

## 2016-06-16 DIAGNOSIS — J42 Unspecified chronic bronchitis: Secondary | ICD-10-CM

## 2016-06-16 DIAGNOSIS — R1313 Dysphagia, pharyngeal phase: Secondary | ICD-10-CM | POA: Diagnosis not present

## 2016-06-16 DIAGNOSIS — Z0000 Encounter for general adult medical examination without abnormal findings: Secondary | ICD-10-CM | POA: Diagnosis not present

## 2016-06-16 DIAGNOSIS — I10 Essential (primary) hypertension: Secondary | ICD-10-CM | POA: Diagnosis not present

## 2016-06-16 DIAGNOSIS — Z125 Encounter for screening for malignant neoplasm of prostate: Secondary | ICD-10-CM | POA: Diagnosis not present

## 2016-06-16 DIAGNOSIS — I2510 Atherosclerotic heart disease of native coronary artery without angina pectoris: Secondary | ICD-10-CM | POA: Diagnosis not present

## 2016-06-16 LAB — COMPLETE METABOLIC PANEL WITH GFR
ALBUMIN: 3.9 g/dL (ref 3.6–5.1)
ALK PHOS: 67 U/L (ref 40–115)
ALT: 15 U/L (ref 9–46)
AST: 15 U/L (ref 10–35)
BUN: 16 mg/dL (ref 7–25)
CO2: 28 mmol/L (ref 20–31)
Calcium: 9.6 mg/dL (ref 8.6–10.3)
Chloride: 102 mmol/L (ref 98–110)
Creat: 1.06 mg/dL (ref 0.70–1.18)
GFR, Est African American: 78 mL/min (ref >=60)
GFR, Est Non African American: 68 mL/min (ref >=60)
GLUCOSE: 106 mg/dL — AB (ref 70–99)
POTASSIUM: 5.1 mmol/L (ref 3.5–5.3)
SODIUM: 138 mmol/L (ref 135–146)
TOTAL PROTEIN: 6.7 g/dL (ref 6.1–8.1)
Total Bilirubin: 0.4 mg/dL (ref 0.2–1.2)

## 2016-06-16 LAB — CBC WITH DIFFERENTIAL/PLATELET
Basophils Absolute: 0 cells/uL (ref 0–200)
Basophils Relative: 0 %
EOS PCT: 0 %
Eosinophils Absolute: 0 cells/uL — ABNORMAL LOW (ref 15–500)
HCT: 44.4 % (ref 38.5–50.0)
HEMOGLOBIN: 15 g/dL (ref 13.0–17.0)
Lymphocytes Relative: 6 %
Lymphs Abs: 576 cells/uL — ABNORMAL LOW (ref 850–3900)
MCH: 32.9 pg (ref 27.0–33.0)
MCHC: 33.8 g/dL (ref 32.0–36.0)
MCV: 97.4 fL (ref 80.0–100.0)
MONOS PCT: 5 %
MPV: 9 fL (ref 7.5–12.5)
Monocytes Absolute: 480 cells/uL (ref 200–950)
NEUTROS ABS: 8544 {cells}/uL — AB (ref 1500–7800)
Neutrophils Relative %: 89 %
PLATELETS: 432 10*3/uL — AB (ref 140–400)
RBC: 4.56 MIL/uL (ref 4.20–5.80)
RDW: 13.8 % (ref 11.0–15.0)
WBC: 9.6 10*3/uL (ref 3.8–10.8)

## 2016-06-16 LAB — LIPID PANEL
CHOL/HDL RATIO: 3.7 ratio (ref <5.0)
Cholesterol: 222 mg/dL — ABNORMAL HIGH (ref <200)
HDL: 60 mg/dL (ref >40)
LDL CALC: 138 mg/dL — AB (ref <100)
Triglycerides: 122 mg/dL (ref <150)
VLDL: 24 mg/dL (ref <30)

## 2016-06-16 LAB — PSA: PSA: 1.9 ng/mL (ref <=4.0)

## 2016-06-16 MED ORDER — LOSARTAN POTASSIUM 100 MG PO TABS: 100.0000 mg | ORAL_TABLET | Freq: Every day | ORAL | 3 refills | Status: DC
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
## 2016-06-18 ENCOUNTER — Encounter: Payer: Self-pay | Admitting: Internal Medicine

## 2016-06-18 ENCOUNTER — Ambulatory Visit (INDEPENDENT_AMBULATORY_CARE_PROVIDER_SITE_OTHER): Payer: Medicare PPO | Admitting: Internal Medicine

## 2016-06-18 VITALS — BP 122/54 | HR 68 | Ht 70.0 in | Wt 144.2 lb

## 2016-06-18 DIAGNOSIS — R1319 Other dysphagia: Secondary | ICD-10-CM

## 2016-06-18 DIAGNOSIS — R1310 Dysphagia, unspecified: Secondary | ICD-10-CM | POA: Diagnosis not present

## 2016-06-18 DIAGNOSIS — K222 Esophageal obstruction: Secondary | ICD-10-CM | POA: Diagnosis not present

## 2016-06-18 DIAGNOSIS — Z86010 Personal history of colonic polyps: Secondary | ICD-10-CM | POA: Diagnosis not present

## 2016-06-18 DIAGNOSIS — K219 Gastro-esophageal reflux disease without esophagitis: Secondary | ICD-10-CM | POA: Diagnosis not present

## 2016-06-18 MED ORDER — OMEPRAZOLE 20 MG PO CPDR: 20.0000 mg | DELAYED_RELEASE_CAPSULE | Freq: Every day | ORAL | 3 refills | Status: DC

## 2016-06-18 NOTE — Patient Instructions (Signed)
  You have been scheduled for an endoscopy and colonoscopy. Please follow the written instructions given to you at your visit today. Please pick up your prep supplies at the pharmacy. If you use inhalers (even only as needed), please bring them with you on the day of your procedure. Your physician has requested that you go to www.startemmi.com and enter the access code given to you at your visit today. This web site gives a general overview about your procedure. However, you should still follow specific instructions given to you by our office regarding your preparation for the procedure.   We have sent the following medications to your pharmacy for you to pick up at your convenience: Pantoprazole    I appreciate the opportunity to care for you. Silvano Rusk, MD, [facility]

## 2016-06-18 NOTE — Progress Notes (Signed)
Leslie Cardenas 76 y.o. 10/09/1940 [id number]  Assessment & Plan:   Encounter Diagnoses  Name Primary?  . Esophageal dysphagia Yes  . GERD with stricture   . Hx of adenomatous colonic polyps     Chew well, avoid dysphagia triggers Restart PPI - omperazole 20 mg qd Rx EGD/dilation Surveillance colonoscopy The risks and benefits as well as alternatives of endoscopic procedure(s) have been discussed and reviewed. All questions answered. The patient agrees to proceed.  Plans on getting lower dentures  I appreciate the opportunity to care for him. [id number] TOM, MD    Subjective:   Chief Complaint: dysphagia  HPI Elderly wm here - hx dilation of esophageal stricture 2013 and 3 adenomas removed from colon 2012 - has not been back since but now having intermitent solid food dysphagia x -10 mos. Stopped his PPI for uclear reasons Potatoes are the worst offender re: dysphagia. Does not have heartburn much if at all. GI ROS o/w negative.   No Known Allergies Outpatient Medications Prior to Visit  Medication Sig Dispense Refill  . albuterol (PROVENTIL HFA;VENTOLIN HFA) 108 (90 Base) MCG/ACT inhaler Inhale 2 puffs into the lungs every 6 (six) hours as needed for wheezing or shortness of breath. 1 Inhaler 0  . atorvastatin (LIPITOR) 40 MG tablet Take 1 tablet (40 mg total) by mouth daily. 90 tablet 3  . baclofen (LIORESAL) 10 MG tablet Take 1 tablet (10 mg total) by mouth 3 (three) times daily as needed for muscle spasms. 30 each 2  . losartan (COZAAR) 100 MG tablet Take 1 tablet (100 mg total) by mouth daily. 90 tablet 3  . nitroGLYCERIN (NITROSTAT) 0.4 MG SL tablet Place 1 tablet (0.4 mg total) under the tongue every 5 (five) minutes x 3 doses as needed for chest pain. 25 tablet 4  . predniSONE (DELTASONE) 10 MG tablet 1 tablet every other day (Patient taking differently: as needed. 1 tablet every other day) 60 tablet 1  . traMADol (ULTRAM) 50 MG tablet Take 1 tablet  (50 mg total) by mouth every 6 (six) hours as needed. 15 tablet 0  . predniSONE (STERAPRED UNI-PAK 21 TAB) 10 MG (21) TBPK tablet      No facility-administered medications prior to visit.    Past Medical History:  Diagnosis Date  . Colon polyp   . COPD (chronic obstructive pulmonary disease) ([place])   . Coronary artery disease   . Diverticulosis   . Esophageal stricture   . Hyperlipidemia   . 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 Vitamin D deficiency    Past Surgical History:  Procedure Laterality Date  . CARDIAC CATHETERIZATION  1983   No intervention performed.   . COLONOSCOPY W/ POLYPECTOMY  08/12/2010   Dr. Silvano Rusk  . ESOPHAGOGASTRODUODENOSCOPY    . INGUINAL HERNIA REPAIR     left  . right leg amputation    . SHOULDER ARTHROSCOPY Left 2000   Social History   Social History  . Marital status: Divorced    Spouse name: N/A  . Number of children: 4  . Years of education: N/A   Occupational History  . retired Retired   Social History Main Topics  . Smoking status: Former Smoker    Packs/day: 3.00    Years: 20.00    Types: Cigarettes    Quit date: 04/11/2000  . Smokeless tobacco: Never Used  . Alcohol use No     Comment: rare - beer occasionally  . Drug use: No  .  Sexual activity: Not Currently   Other Topics Concern  . None   Social History Narrative   Lives in [place], [place].    Family History  Problem Relation Age of Onset  . Hypertension Father   . Pancreatic cancer Mother   . Cancer Mother   . Colon cancer Brother   . Leukemia Brother   . Prostate cancer Brother   . Other Brother     polio  . Esophageal cancer Neg Hx   . Rectal cancer Neg Hx   . 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   Objective:  @BP  (!) 122/54   Pulse 68   Ht 5\' 10"  (1.778 m)   Wt 144 lb 4 oz (65.4 kg)   BMI 20.70 kg/m @  General:  Well-developed, well-nourished and in no acute distress Eyes:  anicteric. ENT:   Mouth and posterior pharynx free of lesions. + upper dentures - edentulous lower Neck:   supple w/o thyromegaly or mass.  Lungs: Clear to auscultation bilaterally. Heart:  S1S2, no rubs, murmurs, gallops. Abdomen:  soft, non-tender, no hepatosplenomegaly, hernia, or mass and BS+.  Rectal: deferred Lymph:  no cervical or supraclavicular adenopathy. Extremities:   no edema, cyanosis or clubbing - R LE prosthesis Skin   no rash. Sun damage changes Neuro:  A&O x 3.  Psych:  appropriate mood and  Affect.   Data Reviewed:  Per HPI Recent labs in EMR also reviewed NL CBC, CMET

## 2016-06-19 ENCOUNTER — Ambulatory Visit: Payer: Medicare PPO

## 2016-06-22 ENCOUNTER — Other Ambulatory Visit: Payer: Self-pay | Admitting: Family Medicine

## 2016-06-22 MED ORDER — EZETIMIBE 10 MG PO TABS: 10.0000 mg | ORAL_TABLET | Freq: Every day | ORAL | 3 refills | Status: DC

## 2016-07-02 ENCOUNTER — Ambulatory Visit (INDEPENDENT_AMBULATORY_CARE_PROVIDER_SITE_OTHER): Payer: Medicare PPO | Admitting: Physician Assistant

## 2016-07-02 ENCOUNTER — Encounter: Payer: Self-pay | Admitting: Physician Assistant

## 2016-07-02 VITALS — BP 120/58 | HR 65 | Temp 97.3°F | Resp 18 | Wt 145.4 lb

## 2016-07-02 DIAGNOSIS — J439 Emphysema, unspecified: Secondary | ICD-10-CM | POA: Diagnosis not present

## 2016-07-02 DIAGNOSIS — J988 Other specified respiratory disorders: Secondary | ICD-10-CM

## 2016-07-02 DIAGNOSIS — B9689 Other specified bacterial agents as the cause of diseases classified elsewhere: Secondary | ICD-10-CM

## 2016-07-02 MED ORDER — AZITHROMYCIN 250 MG PO TABS: ORAL_TABLET | ORAL | 0 refills | Status: DC

## 2016-07-02 MED ORDER — PREDNISONE 20 MG PO TABS: 20.0000 mg | ORAL_TABLET | Freq: Every day | ORAL | 0 refills | Status: DC

## 2016-07-02 NOTE — Progress Notes (Signed)
Patient ID: Leslie Cardenas MRN: [id number], DOB: 30-Aug-1940, 76 y.o. Date of Encounter: 07/02/2016, 4:31 PM    Chief Complaint:  Chief Complaint  Patient presents with  . Shortness of Breath    x2 weeks  . Cough    otc theraflu  . Fever  . Headache  . Generalized Body Aches     HPI: 76 y.o. year old male presents with above.   Says that he has had all of the above symptoms for about 2 weeks. One day he will feel better but then the next day he will feel bad again. That's why he has let it go on so long. He feels chest congestion and feels phlegm in there but can't get the phlegm out. At times will have runny nose and have to blow his nose quite a bit. Also had the other associated symptoms as above. No lower extremity edema. Says that when he was here for his physical recently he was given this Symbicort inhaler and he has been using that twice a day since that physical. Says that he no longer has an albuterol inhaler says that in the past he cannot afford that.     Home Meds:   Outpatient Medications Prior to Visit  Medication Sig Dispense Refill  . albuterol (PROVENTIL HFA;VENTOLIN HFA) 108 (90 Base) MCG/ACT inhaler Inhale 2 puffs into the lungs every 6 (six) hours as needed for wheezing or shortness of breath. 1 Inhaler 0  . atorvastatin (LIPITOR) 40 MG tablet Take 1 tablet (40 mg total) by mouth daily. 90 tablet 3  . baclofen (LIORESAL) 10 MG tablet Take 1 tablet (10 mg total) by mouth 3 (three) times daily as needed for muscle spasms. 30 each 2  . ezetimibe (ZETIA) 10 MG tablet Take 1 tablet (10 mg total) by mouth daily. 90 tablet 3  . losartan (COZAAR) 100 MG tablet Take 1 tablet (100 mg total) by mouth daily. 90 tablet 3  . nitroGLYCERIN (NITROSTAT) 0.4 MG SL tablet Place 1 tablet (0.4 mg total) under the tongue every 5 (five) minutes x 3 doses as needed for chest pain. 25 tablet 4  . omeprazole (PRILOSEC) 20 MG capsule Take 1 capsule (20 mg total) by mouth daily before  breakfast. 90 capsule 3  . predniSONE (DELTASONE) 10 MG tablet 1 tablet every other day (Patient taking differently: as needed. 1 tablet every other day) 60 tablet 1  . traMADol (ULTRAM) 50 MG tablet Take 1 tablet (50 mg total) by mouth every 6 (six) hours as needed. 15 tablet 0   No facility-administered medications prior to visit.     Allergies: No Known Allergies    Review of Systems: See HPI for pertinent ROS. All other ROS negative.    Physical Exam: Blood pressure (!) 120/58, pulse 65, temperature 97.3 F (36.3 C), temperature source Oral, resp. rate 18, weight 145 lb 6.4 oz (66 kg), SpO2 96 %., Body mass index is 20.86 kg/m. General:  Thin, WM. Appears in no acute distress. HEENT: Normocephalic, atraumatic, eyes without discharge, sclera non-icteric, nares are without discharge. Bilateral auditory canals clear, TM's are without perforation, pearly grey and translucent with reflective cone of light bilaterally. Oral cavity moist, posterior pharynx without exudate, erythema, peritonsillar abscess.  Neck: Supple. No thyromegaly. No lymphadenopathy. Lungs: Very slight wheeze scattered throughout bilaterally.  Heart: Regular rhythm. No murmurs, rubs, or gallops. Msk:  Strength and tone normal for age. Extremities/Skin: Warm and dry. No LE edema. Neuro: Alert and oriented  X 3. Moves all extremities spontaneously. Gait is normal. CNII-XII grossly in tact. Psych:  Responds to questions appropriately with a normal affect.     ASSESSMENT AND PLAN:  76 y.o. year old male with  1. Bacterial respiratory infection He is to take the antibiotic and the prednisone as directed. So recommend use Mucinex DM as expectorant. Follow-up if symptoms worsen or do not resolve within 1 week after completion of medications.. - azithromycin (ZITHROMAX) 250 MG tablet; Day 1: Take 2 daily. Days 2 -5: Take 1 daily.  Dispense: 6 tablet; Refill: 0 - predniSONE (DELTASONE) 20 MG tablet; Take 1 tablet (20 mg  total) by mouth daily with breakfast.  Dispense: 5 tablet; Refill: 0  2. Pulmonary emphysema, unspecified emphysema type ([place]) He is to take the antibiotic and the prednisone as directed. So recommend use Mucinex DM as expectorant. Follow-up if symptoms worsen or do not resolve within 1 week after completion of medications..  - azithromycin (ZITHROMAX) 250 MG tablet; Day 1: Take 2 daily. Days 2 -5: Take 1 daily.  Dispense: 6 tablet; Refill: 0 - predniSONE (DELTASONE) 20 MG tablet; Take 1 tablet (20 mg total) by mouth daily with breakfast.  Dispense: 5 tablet; Refill: 0   Signed, [address] [place], [place], [place] 07/02/2016 4:31 PM

## 2016-07-03 ENCOUNTER — Encounter: Payer: Self-pay | Admitting: Internal Medicine

## 2016-07-16 ENCOUNTER — Ambulatory Visit (AMBULATORY_SURGERY_CENTER): Payer: Medicare PPO | Admitting: Internal Medicine

## 2016-07-16 ENCOUNTER — Encounter: Payer: Self-pay | Admitting: Internal Medicine

## 2016-07-16 VITALS — BP 123/87 | HR 68 | Temp 97.5°F | Resp 18 | Ht 70.0 in | Wt 144.0 lb

## 2016-07-16 DIAGNOSIS — K222 Esophageal obstruction: Secondary | ICD-10-CM | POA: Diagnosis not present

## 2016-07-16 DIAGNOSIS — Z86010 Personal history of colonic polyps: Secondary | ICD-10-CM

## 2016-07-16 DIAGNOSIS — K219 Gastro-esophageal reflux disease without esophagitis: Secondary | ICD-10-CM | POA: Diagnosis not present

## 2016-07-16 DIAGNOSIS — K5790 Diverticulosis of intestine, part unspecified, without perforation or abscess without bleeding: Secondary | ICD-10-CM

## 2016-07-16 DIAGNOSIS — J449 Chronic obstructive pulmonary disease, unspecified: Secondary | ICD-10-CM | POA: Diagnosis not present

## 2016-07-16 DIAGNOSIS — R1319 Other dysphagia: Secondary | ICD-10-CM | POA: Diagnosis not present

## 2016-07-16 DIAGNOSIS — D125 Benign neoplasm of sigmoid colon: Secondary | ICD-10-CM

## 2016-07-16 DIAGNOSIS — R1310 Dysphagia, unspecified: Secondary | ICD-10-CM | POA: Diagnosis not present

## 2016-07-16 DIAGNOSIS — I252 Old myocardial infarction: Secondary | ICD-10-CM | POA: Diagnosis not present

## 2016-07-16 DIAGNOSIS — I2510 Atherosclerotic heart disease of native coronary artery without angina pectoris: Secondary | ICD-10-CM | POA: Diagnosis not present

## 2016-07-16 DIAGNOSIS — I10 Essential (primary) hypertension: Secondary | ICD-10-CM | POA: Diagnosis not present

## 2016-07-16 HISTORY — DX: Diverticulosis of intestine, part unspecified, without perforation or abscess without bleeding: K57.90

## 2016-07-16 HISTORY — PX: COLONOSCOPY W/ POLYPECTOMY: SHX1380

## 2016-07-16 MED ORDER — SODIUM CHLORIDE 0.9 % IV SOLN: 500.0000 mL | INTRAVENOUS | Status: DC

## 2016-07-16 NOTE — Progress Notes (Signed)
To recovery, report to [place], RN, VSS 

## 2016-07-16 NOTE — Progress Notes (Signed)
Called to room to assist during endoscopic procedure.  Patient ID and intended procedure confirmed with present staff. Received instructions for my participation in the procedure from the performing physician.  

## 2016-07-16 NOTE — Op Note (Signed)
[address] Patient Name: Leslie Cardenas Procedure Date: 07/16/2016 1:45 PM MRN: [id number] Endoscopist: Gatha Mayer , MD Age: 76 Referring MD:  Date of Birth: July 25, 1940 Gender: Male Account #: [id number] Procedure:                Colonoscopy Indications:              Surveillance: Personal history of adenomatous                            polyps on last colonoscopy 5 years ago Medicines:                Propofol per Anesthesia, Monitored Anesthesia Care Procedure:                Pre-Anesthesia Assessment:                           - Prior to the procedure, a History and Physical                            was performed, and patient medications and                            allergies were reviewed. The patient's tolerance of                            previous anesthesia was also reviewed. The risks                            and benefits of the procedure and the sedation                            options and risks were discussed with the patient.                            All questions were answered, and informed consent                            was obtained. Prior Anticoagulants: The patient has                            taken no previous anticoagulant or antiplatelet                            agents. ASA Grade Assessment: III - A patient with                            severe systemic disease. After reviewing the risks                            and benefits, the patient was deemed in                            satisfactory condition to undergo the procedure.  After obtaining informed consent, the colonoscope                            was passed under direct vision. Throughout the                            procedure, the patient's blood pressure, pulse, and                            oxygen saturations were monitored continuously. The                            Model CF-HQ190L [id number]) scope was introduced   through the anus and advanced to the the cecum,                            identified by appendiceal orifice and ileocecal                            valve. The colonoscopy was performed without                            difficulty. The patient tolerated the procedure                            well. The quality of the bowel preparation was                            excellent. The bowel preparation used was Miralax.                            The ileocecal valve, appendiceal orifice, and                            rectum were photographed. Scope In: 2:06:29 PM Scope Out: 2:14:49 PM Scope Withdrawal Time: 0 hours 7 minutes 2 seconds  Total Procedure Duration: 0 hours 8 minutes 20 seconds  Findings:                 The perianal and digital rectal examinations were                            normal. Pertinent negatives include normal prostate                            (size, shape, and consistency).                           A 5 mm polyp was found in the sigmoid colon. The                            polyp was sessile. The polyp was removed with a                            cold snare.  Resection and retrieval were complete.                            Verification of patient identification for the                            specimen was done. Estimated blood loss was minimal.                           Multiple small and large-mouthed diverticula were                            found in the sigmoid colon.                           The exam was otherwise without abnormality on                            direct and retroflexion views. Complications:            No immediate complications. Estimated Blood Loss:     Estimated blood loss was minimal. Impression:               - One 5 mm polyp in the sigmoid colon, removed with                            a cold snare. Resected and retrieved.                           - Diverticulosis in the sigmoid colon.                           - The  examination was otherwise normal on direct                            and retroflexion views.                           - Personal history of colonic polyps. Adenomas                            2006, 2012 - 4 total and small Recommendation:           - Patient has a contact number available for                            emergencies. The signs and symptoms of potential                            delayed complications were discussed with the                            patient. Return to normal activities tomorrow.                            Written discharge instructions  were provided to the                            patient.                           - No repeat colonoscopy due to age.                           - Resume previous diet.                           - Continue present medications. Gatha Mayer, MD 07/16/2016 2:24:41 PM This report has been signed electronically.

## 2016-07-16 NOTE — Patient Instructions (Addendum)
I dilated the esophagus - I think you will swallow better. Stay on the omeprazole.  I took one polyp out of the colon. I will not recommend another routine colonoscopy.  I appreciate the opportunity to care for you. Gatha Mayer, MD, [facility]  Impressions/Recommendations:  Hiatal hernia handout given to patient.  Clear liquids for one hour, than soft foods the rest of the day.  Polyp handout given to patient. Diverticulosis handout given to patient.  YOU HAD AN ENDOSCOPIC PROCEDURE TODAY AT [facility]:   Refer to the procedure report that was given to you for any specific questions about what was found during the examination.  If the procedure report does not answer your questions, please call your gastroenterologist to clarify.  If you requested that your care partner not be given the details of your procedure findings, then the procedure report has been included in a sealed envelope for you to review at your convenience later.  YOU SHOULD EXPECT: Some feelings of bloating in the abdomen. Passage of more gas than usual.  Walking can help get rid of the air that was put into your GI tract during the procedure and reduce the bloating. If you had a lower endoscopy (such as a colonoscopy or flexible sigmoidoscopy) you may notice spotting of blood in your stool or on the toilet paper. If you underwent a bowel prep for your procedure, you may not have a normal bowel movement for a few days.  Please Note:  You might notice some irritation and congestion in your nose or some drainage.  This is from the oxygen used during your procedure.  There is no need for concern and it should clear up in a day or so.  SYMPTOMS TO REPORT IMMEDIATELY:   Following lower endoscopy (colonoscopy or flexible sigmoidoscopy):  Excessive amounts of blood in the stool  Significant tenderness or worsening of abdominal pains  Swelling of the abdomen that is new, acute  Fever of 100F or  higher   Following upper endoscopy (EGD)  Vomiting of blood or coffee ground material  New chest pain or pain under the shoulder blades  Painful or persistently difficult swallowing  New shortness of breath  Fever of 100F or higher  Black, tarry-looking stools  For urgent or emergent issues, a gastroenterologist can be reached at any hour by calling [phone].   DIET:  We do recommend a small meal at first, but then you may proceed to your regular diet.  Drink plenty of fluids but you should avoid alcoholic beverages for 24 hours.  ACTIVITY:  You should plan to take it easy for the rest of today and you should NOT DRIVE or use heavy machinery until tomorrow (because of the sedation medicines used during the test).    FOLLOW UP: Our staff will call the number listed on your records the next business day following your procedure to check on you and address any questions or concerns that you may have regarding the information given to you following your procedure. If we do not reach you, we will leave a message.  However, if you are feeling well and you are not experiencing any problems, there is no need to return our call.  We will assume that you have returned to your regular daily activities without incident.  If any biopsies were taken you will be contacted by phone or by letter within the next 1-3 weeks.  Please call us at [phone] if you  have not heard about the biopsies in 3 weeks.    SIGNATURES/CONFIDENTIALITY: You and/or your care partner have signed paperwork which will be entered into your electronic medical record.  These signatures attest to the fact that that the information above on your After Visit Summary has been reviewed and is understood.  Full responsibility of the confidentiality of this discharge information lies with you and/or your care-partner.  Stay on Omeprazole or other PPI.

## 2016-07-16 NOTE — Op Note (Signed)
[facility] Patient Name: Leslie Cardenas Procedure Date: 07/16/2016 1:45 PM MRN: [id number] Endoscopist: Gatha Mayer , MD Age: 76 Referring MD:  Date of Birth: 1941/01/28 Gender: Male Account #: [id number] Procedure:                Upper GI endoscopy Indications:              Dysphagia, Stricture of the esophagus, For therapy                            of esophageal stricture Medicines:                Propofol per Anesthesia, Monitored Anesthesia Care Procedure:                Pre-Anesthesia Assessment:                           - Prior to the procedure, a History and Physical                            was performed, and patient medications and                            allergies were reviewed. The patient's tolerance of                            previous anesthesia was also reviewed. The risks                            and benefits of the procedure and the sedation                            options and risks were discussed with the patient.                            All questions were answered, and informed consent                            was obtained. Prior Anticoagulants: The patient has                            taken no previous anticoagulant or antiplatelet                            agents. ASA Grade Assessment: III - A patient with                            severe systemic disease. After reviewing the risks                            and benefits, the patient was deemed in                            satisfactory condition to undergo the procedure.  After obtaining informed consent, the endoscope was                            passed under direct vision. Throughout the                            procedure, the patient's blood pressure, pulse, and                            oxygen saturations were monitored continuously. The                            Model GIF-HQ190 [id number]) scope was introduced   through the mouth, and advanced to the second part                            of duodenum. The upper GI endoscopy was                            accomplished without difficulty. The patient                            tolerated the procedure well. Scope In: Scope Out: Findings:                 One moderate benign-appearing, intrinsic stenosis                            was found at the gastroesophageal junction. This                            measured 1.6 cm (inner diameter) x less than one cm                            (in length) and was traversed. A TTS dilator was                            passed through the scope. Dilation with an 18-19-20                            mm balloon dilator was performed to 20 mm. The                            dilation site was examined and showed moderate                            improvement in luminal narrowing. Estimated blood                            loss was minimal.                           A small hiatal hernia was present.  The exam was otherwise without abnormality.                           The cardia and gastric fundus were normal on                            retroflexion. Complications:            No immediate complications. Estimated Blood Loss:     Estimated blood loss was minimal. Impression:               - Benign-appearing esophageal stenosis. Dilated.                           - Small hiatal hernia.                           - The examination was otherwise normal.                           - No specimens collected. Recommendation:           - Patient has a contact number available for                            emergencies. The signs and symptoms of potential                            delayed complications were discussed with the                            patient. Return to normal activities tomorrow.                            Written discharge instructions were provided to the                             patient.                           - Clear liquids x 1 hour then soft foods rest of                            day. Start prior diet tomorrow.                           - Continue present medications.                           - Stay on omeprazole or other PPI                           Repeat dilation prn Gatha Mayer, MD 07/16/2016 2:20:38 PM This report has been signed electronically.

## 2016-07-17 ENCOUNTER — Telehealth: Payer: Self-pay | Admitting: *Deleted

## 2016-07-17 NOTE — Telephone Encounter (Signed)
  Follow up Call-  Call back number 07/16/2016  Post procedure Call Back phone  # [phone]  Permission to leave phone message Yes  Some recent data might be hidden     Patient questions:  Do you have a fever, pain , or abdominal swelling? No. Pain Score  0 *  Have you tolerated food without any problems? Yes.    Have you been able to return to your normal activities? Yes.    Do you have any questions about your discharge instructions: Diet   No. Medications  No. Follow up visit  No.  Do you have questions or concerns about your Care? No.  Actions: * If pain score is 4 or above: No action needed, pain <4.

## 2016-07-21 ENCOUNTER — Encounter: Payer: Self-pay | Admitting: Internal Medicine

## 2016-07-21 DIAGNOSIS — Z86010 Personal history of colonic polyps: Secondary | ICD-10-CM

## 2016-07-24 ENCOUNTER — Other Ambulatory Visit: Payer: Self-pay | Admitting: Family Medicine

## 2016-09-02 ENCOUNTER — Encounter: Payer: Self-pay | Admitting: Internal Medicine

## 2016-10-05 ENCOUNTER — Encounter: Payer: Self-pay | Admitting: Family Medicine

## 2016-10-16 ENCOUNTER — Telehealth: Payer: Self-pay | Admitting: Family Medicine

## 2016-10-16 NOTE — Telephone Encounter (Signed)
Pt needs prednisone called in to [facility] on [address].

## 2016-10-16 NOTE — Telephone Encounter (Signed)
Ok to refill 

## 2016-10-19 MED ORDER — PREDNISONE 10 MG PO TABS: 10.0000 mg | ORAL_TABLET | ORAL | 0 refills | Status: DC

## 2016-10-19 NOTE — Telephone Encounter (Signed)
For what?  Is he having difficulty breathing?

## 2016-10-19 NOTE — Telephone Encounter (Signed)
Med sent to pharm - lmovm informing pt to schedule appt with Dr. Nolon Rod it is dangerous to take Prednisone like that for long periods of time.

## 2016-10-19 NOTE — Telephone Encounter (Signed)
Its dangerous to take prednisone indefinitely.  I am ok with temp refill but I would like to see him to discuss.

## 2016-10-19 NOTE — Telephone Encounter (Signed)
Pt takes 10mg  qod for itching - has been taking this this way for a long time - before he come to [place] and it has been refilled that way a few times. Ok to refill??

## 2017-01-03 ENCOUNTER — Other Ambulatory Visit: Payer: Self-pay | Admitting: Family Medicine

## 2017-01-11 ENCOUNTER — Encounter: Payer: Self-pay | Admitting: Family Medicine

## 2017-01-11 ENCOUNTER — Ambulatory Visit (INDEPENDENT_AMBULATORY_CARE_PROVIDER_SITE_OTHER): Payer: Medicare PPO | Admitting: Family Medicine

## 2017-01-11 VITALS — BP 140/88 | HR 94 | Temp 98.6°F | Resp 18 | Ht 70.0 in | Wt 137.0 lb

## 2017-01-11 DIAGNOSIS — I2510 Atherosclerotic heart disease of native coronary artery without angina pectoris: Secondary | ICD-10-CM | POA: Diagnosis not present

## 2017-01-11 DIAGNOSIS — J439 Emphysema, unspecified: Secondary | ICD-10-CM

## 2017-01-11 DIAGNOSIS — I10 Essential (primary) hypertension: Secondary | ICD-10-CM

## 2017-01-11 LAB — COMPLETE METABOLIC PANEL WITH GFR
ALT: 14 U/L (ref 9–46)
AST: 19 U/L (ref 10–35)
Albumin: 4.2 g/dL (ref 3.6–5.1)
Alkaline Phosphatase: 64 U/L (ref 40–115)
BUN: 17 mg/dL (ref 7–25)
CHLORIDE: 99 mmol/L (ref 98–110)
CO2: 26 mmol/L (ref 20–32)
CREATININE: 1.04 mg/dL (ref 0.70–1.18)
Calcium: 9.9 mg/dL (ref 8.6–10.3)
GFR, Est African American: 80 mL/min (ref >=60)
GFR, Est Non African American: 69 mL/min (ref >=60)
Glucose, Bld: 81 mg/dL (ref 70–99)
Potassium: 4.9 mmol/L (ref 3.5–5.3)
Sodium: 139 mmol/L (ref 135–146)
Total Bilirubin: 0.5 mg/dL (ref 0.2–1.2)
Total Protein: 6.7 g/dL (ref 6.1–8.1)

## 2017-01-11 LAB — CBC WITH DIFFERENTIAL/PLATELET
BASOS PCT: 1 %
Basophils Absolute: 80 cells/uL (ref 0–200)
Eosinophils Absolute: 400 cells/uL (ref 15–500)
Eosinophils Relative: 5 %
HCT: 47.2 % (ref 38.5–50.0)
HEMOGLOBIN: 15.9 g/dL (ref 13.0–17.0)
LYMPHS PCT: 15 %
Lymphs Abs: 1200 cells/uL (ref 850–3900)
MCH: 33.2 pg — ABNORMAL HIGH (ref 27.0–33.0)
MCHC: 33.7 g/dL (ref 32.0–36.0)
MCV: 98.5 fL (ref 80.0–100.0)
MONO ABS: 800 {cells}/uL (ref 200–950)
MPV: 8.8 fL (ref 7.5–12.5)
Monocytes Relative: 10 %
NEUTROS PCT: 69 %
Neutro Abs: 5520 cells/uL (ref 1500–7800)
Platelets: 457 10*3/uL — ABNORMAL HIGH (ref 140–400)
RBC: 4.79 MIL/uL (ref 4.20–5.80)
RDW: 13.5 % (ref 11.0–15.0)
WBC: 8 10*3/uL (ref 3.8–10.8)

## 2017-01-11 LAB — LIPID PANEL
Cholesterol: 209 mg/dL — ABNORMAL HIGH (ref <200)
HDL: 44 mg/dL (ref >40)
LDL Cholesterol: 130 mg/dL — ABNORMAL HIGH (ref <100)
Total CHOL/HDL Ratio: 4.8 Ratio (ref <5.0)
Triglycerides: 173 mg/dL — ABNORMAL HIGH (ref <150)
VLDL: 35 mg/dL — AB (ref <30)

## 2017-01-11 NOTE — Progress Notes (Signed)
Subjective:    Patient ID: Leslie Cardenas, male    DOB: Aug 24, 1940, 76 y.o.   MRN: [id number]  HPI  06/2016 Patient is here today for complete physical exam. Pneumovax 23 is up-to-date. Prevnar 13 is up-to-date. Shingles vaccine is up-to-date. He is due for a flu shot but he is on prednisone for his back. I recommended returning for the flu shot on Friday. He is overdue for colonoscopy per his report. He is also due for prostate cancer screening. Past medical history is significant for ASCVD and therefore he is due to recheck his cholesterol. His blood pressure today is elevated. Given his history of coronary disease, I recommended starting losartan 100 mg by mouth daily. Ideally I would have the patient on a beta blocker but he also has a history of COPD and bronchospasm and I'm concerned that a beta blocker may exacerbate this. He also has a history of an esophageal stricture requiring an EGD and dilatation. He states this was performed more than 4 years ago. Over the last few months, he has developed progressive dysphasia to solids. Food stick in the distal esophagus. He gets choked frequently. It times it has made him want to stop eating. He would like a referral back to see the gastroenterologist for an EGD and dilatation if necessary.  At that time, my plan was: Exam is consistent with high blood pressure. Begin losartan 100 mg by mouth daily and recheck blood pressure in one month. I have asked the patient to return on Friday so he can receive his flu shot. He is wheezing today on his exam he does have frequent cough and shortness of breath. 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 He will try a sample of this and let us know if it helps. I will consult GI for an EGD and dilatation. He may also be due for colonoscopy per his report. I will check a CBC, CMP, and a fasting lipid panel. Goal LDL cholesterol is less than 70. I'll also check a  PSA to screen for prostate cancer.   01/11/17 Since I last saw the patient, he has stopped all of his medications because he felt like he didn't need them anymore. He is not taking any zetia, aspirin, losartan, etc.  he will occasionally take aspirin. He will also occasionally take prednisone tablets for itching. He stopped the medication, not because he felt bad, but he did not see the utility in taking it. Since that time he denies any shortness of breath, dyspnea on exertion, angina, or chest pain. Past medical history significant for coronary artery disease in 1982. After that time the patient quit smoking. He has been asymptomatic ever since. His blood pressure today is elevated borderline. I'm not sure what his cholesterol looks like. He quit the Symbicort because he saw no benefit. His biggest concern today is itching in his left arm down to his left hand. There is dry excoriated skin on his left bicep as well as on his left forearm. He is seeing a dermatologist in the past and was even treated with steroid creams and UV light without benefit. He try these for more than 6 months. 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 Past Medical History:  Diagnosis Date  . Colon polyp   . COPD (chronic obstructive pulmonary disease) ([place])   . Coronary artery disease   . Diverticulosis   .  Esophageal stricture   . Hyperlipidemia   . 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 Vitamin D deficiency    Past Surgical History:  Procedure Laterality Date  . CARDIAC CATHETERIZATION  1983   No intervention performed.   . COLONOSCOPY W/ POLYPECTOMY  08/12/2010   Dr. Silvano Rusk  . ESOPHAGOGASTRODUODENOSCOPY    . INGUINAL HERNIA REPAIR     left  . right leg amputation    . SHOULDER ARTHROSCOPY Left 2000   Current Outpatient Prescriptions on File Prior to Visit  Medication Sig Dispense Refill  . nitroGLYCERIN (NITROSTAT) 0.4 MG SL tablet Place 1 tablet (0.4  mg total) under the tongue every 5 (five) minutes x 3 doses as needed for chest pain. 25 tablet 4  . predniSONE (DELTASONE) 10 MG tablet TAKE 1 TABLET BY MOUTH EVERY OTHER DAY 30 tablet 0  . albuterol (PROVENTIL HFA;VENTOLIN HFA) 108 (90 Base) MCG/ACT inhaler Inhale 2 puffs into the lungs every 6 (six) hours as needed for wheezing or shortness of breath. (Patient not taking: Reported on 01/11/2017) 1 Inhaler 0  . ezetimibe (ZETIA) 10 MG tablet Take 1 tablet (10 mg total) by mouth daily. (Patient not taking: Reported on 01/11/2017) 90 tablet 3  . losartan (COZAAR) 100 MG tablet Take 1 tablet (100 mg total) by mouth daily. (Patient not taking: Reported on 01/11/2017) 90 tablet 3  . omeprazole (PRILOSEC) 20 MG capsule Take 1 capsule (20 mg total) by mouth daily before breakfast. (Patient not taking: Reported on 01/11/2017) 90 capsule 3   Current Facility-Administered Medications on File Prior to Visit  Medication Dose Route Frequency Provider Last Rate Last Dose  . 0.9 %  sodium chloride infusion  500 mL Intravenous Continuous Gatha Mayer, MD       No Known Allergies Social History   Social History  . Marital status: Divorced    Spouse name: N/A  . Number of children: 4  . Years of education: N/A   Occupational History  . retired Retired   Social History Main Topics  . Smoking status: Former Smoker    Packs/day: 3.00    Years: 20.00    Types: Cigarettes    Quit date: 04/11/2000  . Smokeless tobacco: Never Used  . Alcohol use No     Comment: rare - beer occasionally  . Drug use: No  . Sexual activity: Not Currently   Other Topics Concern  . Not on file   Social History Narrative   Lives in [place], [place].    Family History  Problem Relation Age of Onset  . Hypertension Father   . Pancreatic cancer Mother   . Cancer Mother   . Colon cancer Brother   . Leukemia Brother   . Prostate cancer Brother   . Other Brother        polio  . Esophageal cancer Neg Hx   . Rectal cancer  Neg Hx   . Stomach cancer Neg Hx       Review of Systems  All other systems reviewed and are negative.      Objective:   Physical Exam  Constitutional: He is oriented to person, place, and time. He appears well-developed and well-nourished. No distress.  Eyes: Pupils are equal, round, and reactive to light. Conjunctivae and EOM are normal. Right eye exhibits no discharge. Left eye exhibits no discharge.  Neck: Normal range of motion. Neck supple. No JVD present.  Cardiovascular: Normal rate, regular rhythm, normal heart sounds and  intact distal pulses.  Exam reveals no gallop and no friction rub.   No murmur heard. Pulmonary/Chest: Effort normal and breath sounds normal. No respiratory distress. He has no wheezes. He has no rales. He exhibits no tenderness.  Abdominal: Soft. Bowel sounds are normal. He exhibits no distension and no mass. There is no tenderness. There is no rebound and no guarding.  Musculoskeletal: Normal range of motion. He exhibits no edema, tenderness or deformity.  Neurological: He is alert and oriented to person, place, and time. He has normal reflexes. No cranial nerve deficit. He exhibits normal muscle tone. Coordination normal.  Skin: Skin is warm. Rash noted. He is not diaphoretic. No erythema. No pallor.  Psychiatric: He has a normal mood and affect. His behavior is normal. Judgment and thought content normal.  Vitals reviewed.         Assessment & Plan:  ASCVD (arteriosclerotic cardiovascular disease) - Plan: COMPLETE METABOLIC PANEL WITH GFR, CBC with Differential/Platelet, Lipid panel  Given his history of ASCVD, I strongly encouraged the patient to take aspirin 81 mg a day. Given his history of COPD, he cannot tolerate a beta blocker. Check FLP.  Goal LDL is less than 70.  If greater than 70, resume zetia.  I would also resume losartan to keep bp less than 140/90.  Symbicort did little to help him, so he can use albuterol 2 puff q 6 hrs prn.  Try  eucrissa once daily for possible atopic dermatitis vs grover's disease (biopsy if peristant).

## 2017-01-14 ENCOUNTER — Other Ambulatory Visit: Payer: Self-pay | Admitting: Family Medicine

## 2017-01-14 MED ORDER — LOSARTAN POTASSIUM 50 MG PO TABS: 50.0000 mg | ORAL_TABLET | Freq: Every day | ORAL | 3 refills | Status: DC

## 2017-01-14 MED ORDER — ATORVASTATIN CALCIUM 20 MG PO TABS: 20.0000 mg | ORAL_TABLET | Freq: Every day | ORAL | 3 refills | Status: DC

## 2017-03-01 ENCOUNTER — Other Ambulatory Visit: Payer: Self-pay | Admitting: Family Medicine

## 2017-03-01 NOTE — Telephone Encounter (Signed)
Ok to refill 

## 2017-03-01 NOTE — Telephone Encounter (Signed)
Lets try to wean back on the prednisone and decrease to 5 mg every other day from the 10 mg every other day he was taking.

## 2017-03-04 ENCOUNTER — Other Ambulatory Visit: Payer: Self-pay | Admitting: Family Medicine

## 2017-03-04 MED ORDER — PREDNISONE 5 MG PO TABS: 5.0000 mg | ORAL_TABLET | ORAL | 0 refills | Status: DC

## 2017-03-04 MED ORDER — PREDNISONE 5 MG PO TABS
5.0000 mg | ORAL_TABLET | ORAL | 0 refills | Status: DC
Start: 2017-03-04 — End: 2017-05-28

## 2017-05-28 ENCOUNTER — Other Ambulatory Visit: Payer: Self-pay | Admitting: Family Medicine

## 2017-07-09 DIAGNOSIS — H9193 Unspecified hearing loss, bilateral: Secondary | ICD-10-CM | POA: Diagnosis not present

## 2017-07-09 DIAGNOSIS — Z89511 Acquired absence of right leg below knee: Secondary | ICD-10-CM | POA: Diagnosis not present

## 2017-07-09 DIAGNOSIS — Z87891 Personal history of nicotine dependence: Secondary | ICD-10-CM | POA: Diagnosis not present

## 2017-07-09 DIAGNOSIS — E785 Hyperlipidemia, unspecified: Secondary | ICD-10-CM | POA: Diagnosis not present

## 2017-07-09 DIAGNOSIS — I10 Essential (primary) hypertension: Secondary | ICD-10-CM | POA: Diagnosis not present

## 2017-07-09 DIAGNOSIS — J449 Chronic obstructive pulmonary disease, unspecified: Secondary | ICD-10-CM | POA: Diagnosis not present

## 2017-07-09 DIAGNOSIS — I2510 Atherosclerotic heart disease of native coronary artery without angina pectoris: Secondary | ICD-10-CM | POA: Diagnosis not present

## 2017-07-09 DIAGNOSIS — I252 Old myocardial infarction: Secondary | ICD-10-CM | POA: Diagnosis not present

## 2017-07-09 DIAGNOSIS — Z6821 Body mass index (BMI) 21.0-21.9, adult: Secondary | ICD-10-CM | POA: Diagnosis not present

## 2017-07-27 ENCOUNTER — Ambulatory Visit (INDEPENDENT_AMBULATORY_CARE_PROVIDER_SITE_OTHER): Payer: Medicare PPO | Admitting: Family Medicine

## 2017-07-27 ENCOUNTER — Encounter: Payer: Self-pay | Admitting: Family Medicine

## 2017-07-27 ENCOUNTER — Other Ambulatory Visit: Payer: Self-pay | Admitting: Family Medicine

## 2017-07-27 VITALS — BP 138/74 | HR 66 | Temp 97.6°F | Resp 16 | Ht 70.0 in | Wt 143.0 lb

## 2017-07-27 DIAGNOSIS — R5382 Chronic fatigue, unspecified: Secondary | ICD-10-CM

## 2017-07-27 MED ORDER — TRIAMCINOLONE ACETONIDE 0.1 % EX CREA: 1.0000 "application " | TOPICAL_CREAM | Freq: Two times a day (BID) | CUTANEOUS | 0 refills | Status: DC

## 2017-07-27 NOTE — Progress Notes (Signed)
Subjective:    Patient ID: Leslie Cardenas, male    DOB: September 11, 1940, 77 y.o.   MRN: [id number]  Medication Refill     06/2016 Patient is here today for complete physical exam. Pneumovax 23 is up-to-date. Prevnar 13 is up-to-date. Shingles vaccine is up-to-date. He is due for a flu shot but he is on prednisone for his back. I recommended returning for the flu shot on Friday. He is overdue for colonoscopy per his report. He is also due for prostate cancer screening. Past medical history is significant for ASCVD and therefore he is due to recheck his cholesterol. His blood pressure today is elevated. Given his history of coronary disease, I recommended starting losartan 100 mg by mouth daily. Ideally I would have the patient on a beta blocker but he also has a history of COPD and bronchospasm and I'm concerned that a beta blocker may exacerbate this. He also has a history of an esophageal stricture requiring an EGD and dilatation. He states this was performed more than 4 years ago. Over the last few months, he has developed progressive dysphasia to solids. Food stick in the distal esophagus. He gets choked frequently. It times it has made him want to stop eating. He would like a referral back to see the gastroenterologist for an EGD and dilatation if necessary.  At that time, my plan was: Exam is consistent with high blood pressure. Begin losartan 100 mg by mouth daily and recheck blood pressure in one month. I have asked the patient to return on Friday so he can receive his flu shot. He is wheezing today on his exam he does have frequent cough and shortness of breath. 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 He will try a sample of this and let us know if it helps. I will consult GI for an EGD and dilatation. He may also be due for colonoscopy per his report. I will check a CBC, CMP, and a fasting lipid panel. Goal LDL cholesterol is less than 70.  I'll also check a PSA to screen for prostate cancer.   01/11/17 Since I last saw the patient, he has stopped all of his medications because he felt like he didn't need them anymore. He is not taking any zetia, aspirin, losartan, etc.  he will occasionally take aspirin. He will also occasionally take prednisone tablets for itching. He stopped the medication, not because he felt bad, but he did not see the utility in taking it. Since that time he denies any shortness of breath, dyspnea on exertion, angina, or chest pain. Past medical history significant for coronary artery disease in 1982. After that time the patient quit smoking. He has been asymptomatic ever since. His blood pressure today is elevated borderline. I'm not sure what his cholesterol looks like. He quit the Symbicort because he saw no benefit. His biggest concern today is itching in his left arm down to his left hand. There is dry excoriated skin on his left bicep as well as on his left forearm. He is seeing a dermatologist in the past and was even treated with steroid creams and UV light without benefit. He try these for more than 6 months. 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  07/27/17 Patient presents today with fatigue.  He states for the last several months he feels tired.  He can fall asleep easily.  He sleeps  more than 8 hours every evening but he never seems to get enough rest.  He denies any snoring or apnea.  However other than that, he denies any other symptoms on his review of systems.  He does have a chronic cough but he has underlying COPD with a 50-pack-year history of smoking.  He denies any headaches.  He denies any blurry vision.  He denies any dry mouth.  He denies any fevers or chills.  He is gained 6 pounds since his last visit.  He is not losing weight.  He denies any chest pain shortness of breath or dyspnea on exertion.  He denies any hemoptysis.  He does have a cough.  He  denies any nausea or vomiting or diarrhea.  He denies any melena or hematochezia.  He denies any bone pain or abdominal pain or joint pain.  He denies any fevers or chills.  He does have a rash on the dorsum of his forearm that appears to be eczema. Past Medical History:  Diagnosis Date  . Colon polyp   . COPD (chronic obstructive pulmonary disease) ([place])   . Coronary artery disease   . Diverticulosis   . Esophageal stricture   . Hyperlipidemia   . 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 Vitamin D deficiency    Past Surgical History:  Procedure Laterality Date  . CARDIAC CATHETERIZATION  1983   No intervention performed.   . COLONOSCOPY W/ POLYPECTOMY  08/12/2010   Dr. Silvano Rusk  . ESOPHAGOGASTRODUODENOSCOPY    . INGUINAL HERNIA REPAIR     left  . right leg amputation    . SHOULDER ARTHROSCOPY Left 2000   Current Outpatient Medications on File Prior to Visit  Medication Sig Dispense Refill  . atorvastatin (LIPITOR) 20 MG tablet Take 1 tablet (20 mg total) by mouth daily. 90 tablet 3  . losartan (COZAAR) 50 MG tablet Take 1 tablet (50 mg total) by mouth daily. 90 tablet 3  . nitroGLYCERIN (NITROSTAT) 0.4 MG SL tablet Place 1 tablet (0.4 mg total) under the tongue every 5 (five) minutes x 3 doses as needed for chest pain. 25 tablet 4  . omeprazole (PRILOSEC) 20 MG capsule Take 1 capsule (20 mg total) by mouth daily before breakfast. 90 capsule 3  . albuterol (PROVENTIL HFA;VENTOLIN HFA) 108 (90 Base) MCG/ACT inhaler Inhale 2 puffs into the lungs every 6 (six) hours as needed for wheezing or shortness of breath. (Patient not taking: Reported on 07/27/2017) 1 Inhaler 0   Current Facility-Administered Medications on File Prior to Visit  Medication Dose Route Frequency Provider Last Rate Last Dose  . 0.9 %  sodium chloride infusion  500 mL Intravenous Continuous Gatha Mayer, MD       No Known Allergies Social History   Socioeconomic History  . Marital status: Divorced     Spouse name: Not on file  . Number of children: 4  . Years of education: Not on file  . Highest education level: Not on file  Social Needs  . Financial resource strain: Not on file  . Food insecurity - worry: Not on file  . Food insecurity - inability: Not on file  . Transportation needs - medical: Not on file  . Transportation needs - non-medical: Not on file  Occupational History  . Occupation: retired    Fish farm manager: RETIRED  Tobacco Use  . Smoking status: Former Smoker    Packs/day: 3.00    Years: 20.00    Pack  years: 60.00    Types: Cigarettes    Last attempt to quit: 04/11/2000    Years since quitting: 17.3  . Smokeless tobacco: Never Used  Substance and Sexual Activity  . Alcohol use: No    Comment: rare - beer occasionally  . Drug use: No  . Sexual activity: Not Currently  Other Topics Concern  . Not on file  Social History Narrative   Lives in [place], [place].    Family History  Problem Relation Age of Onset  . Hypertension Father   . Pancreatic cancer Mother   . Cancer Mother   . Colon cancer Brother   . Leukemia Brother   . Prostate cancer Brother   . Other Brother        polio  . Esophageal cancer Neg Hx   . Rectal cancer Neg Hx   . Stomach cancer Neg Hx       Review of Systems  All other systems reviewed and are negative.      Objective:   Physical Exam  Constitutional: He is oriented to person, place, and time. He appears well-developed and well-nourished. No distress.  Eyes: Conjunctivae and EOM are normal. Pupils are equal, round, and reactive to light. Right eye exhibits no discharge. Left eye exhibits no discharge.  Neck: Normal range of motion. Neck supple. No JVD present.  Cardiovascular: Normal rate, regular rhythm, normal heart sounds and intact distal pulses. Exam reveals no gallop and no friction rub.  No murmur heard. Pulmonary/Chest: Effort normal and breath sounds normal. No respiratory distress. He has no wheezes. He has no rales.  He exhibits no tenderness.  Abdominal: Soft. Bowel sounds are normal. He exhibits no distension and no mass. There is no tenderness. There is no rebound and no guarding.  Musculoskeletal: Normal range of motion. He exhibits no edema, tenderness or deformity.  Neurological: He is alert and oriented to person, place, and time. He has normal reflexes. No cranial nerve deficit. He exhibits normal muscle tone. Coordination normal.  Skin: Skin is warm. Rash noted. He is not diaphoretic. No erythema. No pallor.  Psychiatric: He has a normal mood and affect. His behavior is normal. Judgment and thought content normal.  Vitals reviewed.         Assessment & Plan:  Chronic fatigue - Plan: CBC with Differential/Platelet, COMPLETE METABOLIC PANEL WITH GFR, TSH, Vitamin B12, DG Chest 2 View, Testosterone Total,Free,Bio, Males  Patient's history provides very little clue as to the cause of his fatigue other than possible sleep apnea.  Therefore I will start with some basic lab workup including a CBC to evaluate for anemia or bone marrow abnormalities, CMP to check liver tests, renal function, and screen for diabetes, a TSH to evaluate for hypothyroidism, vitamin B12 level to check for B12 deficiency, testosterone level to check for hypogonadism.  Given his long-term history of smoking, I will recommend a chest x-ray.  Await the results of the lab work and x-ray.  Consider a sleep apnea screen/sleep study if workup is negative.  I will treat the eczema-like rash on his forearm with triamcinolone cream twice daily for 10 days

## 2017-07-28 ENCOUNTER — Ambulatory Visit
Admission: RE | Admit: 2017-07-28 | Discharge: 2017-07-28 | Disposition: A | Discharge status: Home / Self Care | Payer: Medicare PPO | Source: Ambulatory Visit | Attending: Family Medicine | Admitting: Family Medicine

## 2017-07-28 DIAGNOSIS — R5382 Chronic fatigue, unspecified: Secondary | ICD-10-CM

## 2017-07-28 DIAGNOSIS — R05 Cough: Secondary | ICD-10-CM | POA: Diagnosis not present

## 2017-07-28 LAB — CBC WITH DIFFERENTIAL/PLATELET
BASOS ABS: 79 {cells}/uL (ref 0–200)
BASOS PCT: 0.8 %
EOS ABS: 535 {cells}/uL — AB (ref 15–500)
EOS PCT: 5.4 %
HCT: 40.3 % (ref 38.5–50.0)
HEMOGLOBIN: 14 g/dL (ref 13.2–17.1)
LYMPHS ABS: 1178 {cells}/uL (ref 850–3900)
MCH: 32.5 pg (ref 27.0–33.0)
MCHC: 34.7 g/dL (ref 32.0–36.0)
MCV: 93.5 fL (ref 80.0–100.0)
MPV: 9.5 fL (ref 7.5–12.5)
Monocytes Relative: 8.7 %
NEUTROS ABS: 7247 {cells}/uL (ref 1500–7800)
Neutrophils Relative %: 73.2 %
Platelets: 389 10*3/uL (ref 140–400)
RBC: 4.31 10*6/uL (ref 4.20–5.80)
RDW: 12.8 % (ref 11.0–15.0)
Total Lymphocyte: 11.9 %
WBC mixed population: 861 cells/uL (ref 200–950)
WBC: 9.9 10*3/uL (ref 3.8–10.8)

## 2017-07-28 LAB — COMPLETE METABOLIC PANEL WITH GFR
AG Ratio: 2 (calc) (ref 1.0–2.5)
ALBUMIN MSPROF: 4.2 g/dL (ref 3.6–5.1)
ALKALINE PHOSPHATASE (APISO): 67 U/L (ref 40–115)
ALT: 17 U/L (ref 9–46)
AST: 24 U/L (ref 10–35)
BUN: 17 mg/dL (ref 7–25)
CO2: 26 mmol/L (ref 20–32)
CREATININE: 1.16 mg/dL (ref 0.70–1.18)
Calcium: 9.6 mg/dL (ref 8.6–10.3)
Chloride: 102 mmol/L (ref 98–110)
GFR, EST AFRICAN AMERICAN: 70 mL/min/{1.73_m2} (ref >=60)
GFR, EST NON AFRICAN AMERICAN: 60 mL/min/{1.73_m2} (ref >=60)
Globulin: 2.1 g/dL (calc) (ref 1.9–3.7)
Glucose, Bld: 74 mg/dL (ref 65–99)
Potassium: 4.6 mmol/L (ref 3.5–5.3)
SODIUM: 138 mmol/L (ref 135–146)
TOTAL PROTEIN: 6.3 g/dL (ref 6.1–8.1)
Total Bilirubin: 0.4 mg/dL (ref 0.2–1.2)

## 2017-07-28 LAB — VITAMIN B12: VITAMIN B 12: 601 pg/mL (ref 200–1100)

## 2017-07-28 LAB — TSH: TSH: 1.25 m[IU]/L (ref 0.40–4.50)

## 2017-07-29 LAB — TESTOSTERONE TOTAL,FREE,BIO, MALES
Albumin: 4.3 g/dL (ref 3.6–5.1)
SEX HORMONE BINDING: 77 nmol/L (ref 22–77)
TESTOSTERONE: 565 ng/dL (ref 250–827)
Testosterone, Bioavailable: 71.1 ng/dL (ref 15.0–150.0)
Testosterone, Free: 36.1 pg/mL (ref 6.0–73.0)

## 2017-07-30 ENCOUNTER — Other Ambulatory Visit: Payer: Self-pay | Admitting: Family Medicine

## 2017-07-30 DIAGNOSIS — R5382 Chronic fatigue, unspecified: Secondary | ICD-10-CM

## 2017-07-30 DIAGNOSIS — I2510 Atherosclerotic heart disease of native coronary artery without angina pectoris: Secondary | ICD-10-CM

## 2017-08-03 ENCOUNTER — Telehealth: Payer: Self-pay | Admitting: Family Medicine

## 2017-08-03 NOTE — Telephone Encounter (Signed)
CB# [phone]  Patient called requesting lab results.

## 2017-08-04 ENCOUNTER — Other Ambulatory Visit: Payer: Self-pay | Admitting: Family Medicine

## 2017-08-04 DIAGNOSIS — Z1211 Encounter for screening for malignant neoplasm of colon: Secondary | ICD-10-CM

## 2017-08-04 DIAGNOSIS — R5382 Chronic fatigue, unspecified: Secondary | ICD-10-CM

## 2017-08-04 NOTE — Telephone Encounter (Signed)
Patient aware of results and of providers recommendations  

## 2017-08-06 ENCOUNTER — Telehealth: Payer: Self-pay | Admitting: Family Medicine

## 2017-08-06 MED ORDER — TRIAMCINOLONE ACETONIDE 0.1 % EX CREA: 1.0000 "application " | TOPICAL_CREAM | Freq: Two times a day (BID) | CUTANEOUS | 0 refills | Status: DC

## 2017-08-06 NOTE — Telephone Encounter (Signed)
Medication called/sent to requested pharmacy  

## 2017-08-06 NOTE — Telephone Encounter (Signed)
Pt wants refill on kenalog sent to [facility].

## 2017-08-09 ENCOUNTER — Ambulatory Visit (HOSPITAL_COMMUNITY): Payer: Medicare PPO | Attending: Cardiology

## 2017-08-09 ENCOUNTER — Other Ambulatory Visit: Payer: Self-pay

## 2017-08-09 ENCOUNTER — Telehealth: Payer: Self-pay | Admitting: Family Medicine

## 2017-08-09 DIAGNOSIS — I2510 Atherosclerotic heart disease of native coronary artery without angina pectoris: Secondary | ICD-10-CM | POA: Diagnosis not present

## 2017-08-09 DIAGNOSIS — Z87891 Personal history of nicotine dependence: Secondary | ICD-10-CM | POA: Diagnosis not present

## 2017-08-09 DIAGNOSIS — E785 Hyperlipidemia, unspecified: Secondary | ICD-10-CM | POA: Insufficient documentation

## 2017-08-09 DIAGNOSIS — I5189 Other ill-defined heart diseases: Secondary | ICD-10-CM | POA: Insufficient documentation

## 2017-08-09 DIAGNOSIS — J449 Chronic obstructive pulmonary disease, unspecified: Secondary | ICD-10-CM | POA: Diagnosis not present

## 2017-08-09 DIAGNOSIS — M549 Dorsalgia, unspecified: Principal | ICD-10-CM

## 2017-08-09 DIAGNOSIS — R5382 Chronic fatigue, unspecified: Secondary | ICD-10-CM | POA: Diagnosis not present

## 2017-08-09 DIAGNOSIS — G8929 Other chronic pain: Secondary | ICD-10-CM

## 2017-08-09 HISTORY — DX: Other ill-defined heart diseases: I51.89

## 2017-08-09 NOTE — Telephone Encounter (Signed)
Pt came in wanting referral for back pain to dr. Erline Levine. Their fax # [phone] attention olivia. States he has been seen multiple times for this.

## 2017-08-09 NOTE — Telephone Encounter (Signed)
ok 

## 2017-08-09 NOTE — Telephone Encounter (Signed)
OK to do referral

## 2017-08-11 NOTE — Telephone Encounter (Signed)
Referral placed.

## 2017-08-22 ENCOUNTER — Other Ambulatory Visit: Payer: Self-pay | Admitting: Family Medicine

## 2017-09-18 ENCOUNTER — Ambulatory Visit (HOSPITAL_COMMUNITY)
Admission: EM | Admit: 2017-09-18 | Discharge: 2017-09-18 | Disposition: A | Discharge status: Home / Self Care | Payer: Medicare HMO | Attending: Family Medicine | Admitting: Family Medicine

## 2017-09-18 ENCOUNTER — Encounter (HOSPITAL_COMMUNITY): Payer: Self-pay | Admitting: *Deleted

## 2017-09-18 DIAGNOSIS — T148XXA Other injury of unspecified body region, initial encounter: Secondary | ICD-10-CM | POA: Diagnosis not present

## 2017-09-18 MED ORDER — PREDNISONE 10 MG (21) PO TBPK: ORAL_TABLET | Freq: Every day | ORAL | 0 refills | Status: DC

## 2017-09-18 NOTE — ED Triage Notes (Signed)
Reports was falling off a truck 2 days ago when he feels he twisted right stump.  Over past 2 days has had significant tenderness and redness.  Denies fevers.

## 2017-09-18 NOTE — ED Provider Notes (Signed)
[facility]   [id number] 09/18/17 Arrival Time: [place] PLAN:  1. Stump injury    Meds ordered this encounter  Medications  . predniSONE (STERAPRED UNI-PAK 21 TAB) 10 MG (21) TBPK tablet    Sig: Take by mouth daily. Take as directed.    Dispense:  21 tablet    Refill:  0   Suspect inflammatory etiology. No sign of infection. Crutches for temporary use. Will follow up with PCP or here if worsening or failing to improve as anticipated.  Reviewed expectations re: course of current medical issues. Questions answered. Outlined signs and symptoms indicating need for more acute intervention. Patient verbalized understanding. After Visit Summary given.  SUBJECTIVE: History from: patient. BOBBI KOZAKIEWICZ is a 77 y.o. male who reports persistent mild pain of his right BKA stump that is stable; described as aching without radiation. "Hurts to even touch it." Normally wears prosthetic leg. Was getting out of truck 2 days ago and leg "caught". He reports grabbing handle on truck with twisting of his R stump. Very shortly afterward he noticed redness and swelling to stump. Painful since. Afebrile. Has not been wearing prosthetic leg since. No specific aggravating or alleviating factors reported. Ibuprofen without much help.  ROS: As per HPI.   OBJECTIVE:  Vitals:   09/18/17 1253  BP: (!) 133/93  Pulse: 68  Resp: 18  Temp: 98.1 F (36.7 C)  TempSrc: Oral  SpO2: 98%    General appearance: alert; no distress Extremities: no cyanosis or edema; symmetrical with no gross deformities; R BKA stump with slight edema and skin redness; normal skin temperature; tender to touch; no open wounds CV: normal extremity capillary refill Skin: warm and dry Neurologic: normal gait; normal symmetric reflexes in all extremities; normal sensation in all extremities Psychological: alert and cooperative; normal mood and affect  No Known Allergies  Past Medical History:  Diagnosis  Date  . Colon polyp   . COPD (chronic obstructive pulmonary disease) ([place])   . Coronary artery disease   . Diverticulosis   . Esophageal stricture   . Hyperlipidemia   . 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 Vitamin D deficiency    Social History   Socioeconomic History  . Marital status: Divorced    Spouse name: Not on file  . Number of children: 4  . Years of education: Not on file  . Highest education level: Not on file  Occupational History  . Occupation: retired    Fish farm manager: RETIRED  Social Needs  . Financial resource strain: Not on file  . Food insecurity:    Worry: Not on file    Inability: Not on file  . Transportation needs:    Medical: Not on file    Non-medical: Not on file  Tobacco Use  . Smoking status: Former Smoker    Packs/day: 3.00    Years: 20.00    Pack years: 60.00    Types: Cigarettes    Last attempt to quit: 04/11/2000    Years since quitting: 17.4  . Smokeless tobacco: Never Used  Substance and Sexual Activity  . Alcohol use: Not Currently  . Drug use: No  . Sexual activity: Not Currently  Lifestyle  . Physical activity:    Days per week: Not on file    Minutes per session: Not on file  . Stress: Not on file  Relationships  . Social connections:    Talks on phone: Not on file    Gets  together: Not on file    Attends religious service: Not on file    Active member of club or organization: Not on file    Attends meetings of clubs or organizations: Not on file    Relationship status: Not on file  . Intimate partner violence:    Fear of current or ex partner: Not on file    Emotionally abused: Not on file    Physically abused: Not on file    Forced sexual activity: Not on file  Other Topics Concern  . Not on file  Social History Narrative   Lives in [place], [place].    Family History  Problem Relation Age of Onset  . Hypertension Father   . Pancreatic cancer Mother   . Cancer Mother   . Colon cancer Brother   . Leukemia  Brother   . Prostate cancer Brother   . Other Brother        polio  . Esophageal cancer Neg Hx   . Rectal cancer Neg Hx   . Stomach cancer Neg Hx    Past Surgical History:  Procedure Laterality Date  . CARDIAC CATHETERIZATION  1983   No intervention performed.   . COLONOSCOPY W/ POLYPECTOMY  08/12/2010   Dr. Silvano Rusk  . ESOPHAGOGASTRODUODENOSCOPY    . INGUINAL HERNIA REPAIR     left  . right leg amputation    . SHOULDER ARTHROSCOPY Left 2000      Vanessa Kick, MD 09/18/17 1326

## 2017-09-19 ENCOUNTER — Other Ambulatory Visit: Payer: Self-pay | Admitting: Family Medicine

## 2017-09-20 ENCOUNTER — Ambulatory Visit (INDEPENDENT_AMBULATORY_CARE_PROVIDER_SITE_OTHER): Payer: Medicare HMO

## 2017-09-20 ENCOUNTER — Ambulatory Visit (INDEPENDENT_AMBULATORY_CARE_PROVIDER_SITE_OTHER): Payer: Medicare PPO | Admitting: Orthopaedic Surgery

## 2017-09-20 DIAGNOSIS — Z89511 Acquired absence of right leg below knee: Secondary | ICD-10-CM

## 2017-09-20 NOTE — Progress Notes (Signed)
Office Visit Note   Patient: Leslie Cardenas           Date of Birth: [birth year]           MRN: [id number] Visit Date: 09/20/2017              Requested by: [name], MD [address] [place], [place] [address] PCP: [name], MD   Assessment & Plan: Visit Diagnoses:  1. Hx of right BKA ([place])     Plan: Impression is right below the knee amputation soft tissue contusion.  No issues with the bone that I can tell based on x-ray.  Does not appear to be infected.  He may treat this symptomatically and wear his prosthesis as his symptoms allow.  Questions encouraged and answered.  Follow-up as needed.  Follow-Up Instructions: Return if symptoms worsen or fail to improve.   Orders:  Orders Placed This Encounter  Procedures  . XR Tibia/Fibula Right   No orders of the defined types were placed in this encounter.     Procedures: No procedures performed   Clinical Data: No additional findings.   Subjective: Chief Complaint  Patient presents with  . Right Leg - Pain    Patient is a 77 year old gentleman who comes in with acute injury to his right below the knee amputation stump.  He fell this past Thursday while he was getting out of his truck when his prosthesis got hung up on the bed liner.  He has used ice and heat and prednisone which has not helped.  He has had no issues with the prosthesis or the surgery since it was done years ago.  Denies any constitutional symptoms.   Review of Systems  Constitutional: Negative.   All other systems reviewed and are negative.    Objective: Vital Signs: There were no vitals taken for this visit.  Physical Exam  Constitutional: He is oriented to person, place, and time. He appears well-developed and well-nourished.  Pulmonary/Chest: Effort normal.  Abdominal: Soft.  Neurological: He is alert and oriented to person, place, and time.  Skin: Skin is warm.  Psychiatric: He has a normal mood and affect. His  behavior is normal. Judgment and thought content normal.  Nursing note and vitals reviewed.   Ortho Exam Right lower extremity exam shows a fully healed surgical scar.  There is mild tenderness palpation of the distal stump.  Is no crepitus.  There is no fluctuance or cellulitis or warmth.  Is a little bit of slight redness from the skin but likely from use of the prosthesis.  Specialty Comments:  No specialty comments available.  Imaging: Xr Tibia/fibula Right  Result Date: 09/20/2017 Status post below the knee amputation.  No acute findings    PMFS History: Patient Active Problem List   Diagnosis Date Noted  . GERD 02/15/2015  . Medicare annual wellness visit, initial 02/15/2015  . 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 Hx of right BKA ([place]) 01/15/2014  . Essential hypertension 07/05/2013  . Prediabetes 07/05/2013  . Vitamin D deficiency 07/05/2013  . Medication management 07/05/2013  . History of colonic polyps 03/30/2012  . Hyperlipidemia 11/13/2011  . COPD (chronic obstructive pulmonary disease) ([place]) 11/13/2011  . Sinus bradycardia 11/13/2011   Past Medical History:  Diagnosis Date  . Colon polyp   . COPD (chronic obstructive pulmonary disease) ([place])   . Coronary artery disease   . Diverticulosis   . Esophageal stricture   . Hyperlipidemia   .  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 Vitamin D deficiency     Family History  Problem Relation Age of Onset  . Hypertension Father   . Pancreatic cancer Mother   . Cancer Mother   . Colon cancer Brother   . Leukemia Brother   . Prostate cancer Brother   . Other Brother        polio  . Esophageal cancer Neg Hx   . Rectal cancer Neg Hx   . Stomach cancer Neg Hx     Past Surgical History:  Procedure Laterality Date  . CARDIAC CATHETERIZATION  1983   No intervention performed.   . COLONOSCOPY W/ POLYPECTOMY  08/12/2010   Dr. Silvano Rusk  . ESOPHAGOGASTRODUODENOSCOPY    . INGUINAL HERNIA REPAIR     left  . right leg  amputation    . SHOULDER ARTHROSCOPY Left 2000   Social History   Occupational History  . Occupation: retired    Fish farm manager: RETIRED  Tobacco Use  . Smoking status: Former Smoker    Packs/day: 3.00    Years: 20.00    Pack years: 60.00    Types: Cigarettes    Last attempt to quit: 04/11/2000    Years since quitting: 17.4  . Smokeless tobacco: Never Used  Substance and Sexual Activity  . Alcohol use: Not Currently  . Drug use: No  . Sexual activity: Not Currently

## 2017-09-21 ENCOUNTER — Telehealth (INDEPENDENT_AMBULATORY_CARE_PROVIDER_SITE_OTHER): Payer: Self-pay | Admitting: Orthopaedic Surgery

## 2017-09-21 NOTE — Telephone Encounter (Signed)
Patient called asked if Dr Erlinda Hong can tell him why his right leg is swelling so bad on the end of it. The number to contact patient is [phone]

## 2017-09-21 NOTE — Telephone Encounter (Signed)
Because likely due to the soft tissue contusion

## 2017-09-21 NOTE — Telephone Encounter (Signed)
Please advise. You saw patient yesterday.

## 2017-09-22 ENCOUNTER — Ambulatory Visit (INDEPENDENT_AMBULATORY_CARE_PROVIDER_SITE_OTHER): Payer: Medicare HMO | Admitting: Orthopedic Surgery

## 2017-09-22 ENCOUNTER — Encounter (INDEPENDENT_AMBULATORY_CARE_PROVIDER_SITE_OTHER): Payer: Self-pay | Admitting: Orthopedic Surgery

## 2017-09-22 VITALS — Ht 70.0 in | Wt 143.0 lb

## 2017-09-22 DIAGNOSIS — Z89511 Acquired absence of right leg below knee: Secondary | ICD-10-CM

## 2017-09-22 DIAGNOSIS — M86261 Subacute osteomyelitis, right tibia and fibula: Secondary | ICD-10-CM | POA: Diagnosis not present

## 2017-09-22 MED ORDER — DOXYCYCLINE HYCLATE 100 MG PO TABS: 100.0000 mg | ORAL_TABLET | Freq: Two times a day (BID) | ORAL | 0 refills | Status: DC

## 2017-09-22 NOTE — Progress Notes (Signed)
Office Visit Note   Patient: Leslie Cardenas           Date of Birth: April 19, 1941           MRN: [id number] Visit Date: 09/22/2017              Requested by: [name], MD [address] [place], [place] [address] PCP: [name], MD  Chief Complaint  Patient presents with  . Right Leg - Follow-up    Right BKA contusion injury      HPI: Patient is a 77 year old gentleman with a right transtibial amputation.  Patient has recently been seen in the emergency room and in the office.  Patient states he had an acute contusion now has clear drainage from the tip of the stump.  Complains of pain and swelling and redness.  Patient was started on prednisone by the emergency room.  Recommended discontinuing the prednisone patient does have infection of the  Assessment & Plan: Visit Diagnoses:  1. Subacute osteomyelitis of right tibia (H[facility])   2. S/P unilateral BKA (below knee amputation), right ([place])     Plan: Distal tibia.  We will start him on doxycycline 100 mg twice a day follow-up in 1 week.  Discussed that if we are not showing improvement with the doxycycline we would need to consider surgical irrigation debridement and IV antibiotics.  Follow-Up Instructions: No follow-ups on file.   Ortho Exam  Patient is alert, oriented, no adenopathy, well-dressed, normal affect, normal respiratory effort. Examination patient has a residual limb which is red and tender to palpation.  He has an ulcer with clear drainage that probes down to bone which is 2 cm deep.  The ulcer is 5 mm in diameter.  Imaging: No results found. No images are attached to the encounter.  Labs: Lab Results  Component Value Date   HGBA1C 6.4 (H) 07/25/2014   HGBA1C 6.1 (H) 01/15/2014   HGBA1C 6.0 (H) 07/05/2013   LABURIC 7.8 02/12/2016    @LABSALLVALUES (HGBA1)@  Body mass index is 20.52 kg/m.  Orders:  No orders of the defined types were placed in this encounter.  Meds ordered this  encounter  Medications  . doxycycline (VIBRA-TABS) 100 MG tablet    Sig: Take 1 tablet (100 mg total) by mouth 2 (two) times daily.    Dispense:  60 tablet    Refill:  0     Procedures: No procedures performed  Clinical Data: No additional findings.  ROS:  All other systems negative, except as noted in the HPI. Review of Systems  Objective: Vital Signs: Ht 5\' 10"  (1.778 m)   Wt 143 lb (64.9 kg)   BMI 20.52 kg/m   Specialty Comments:  No specialty comments available.  PMFS History: Patient Active Problem List   Diagnosis Date Noted  . Subacute osteomyelitis of right tibia ([place]) 09/22/2017  . S/P unilateral BKA (below knee amputation), right ([place]) 09/22/2017  . GERD 02/15/2015  . Medicare annual wellness visit, initial 02/15/2015  . 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 Hx of right BKA ([place]) 01/15/2014  . Essential hypertension 07/05/2013  . Prediabetes 07/05/2013  . Vitamin D deficiency 07/05/2013  . Medication management 07/05/2013  . History of colonic polyps 03/30/2012  . Hyperlipidemia 11/13/2011  . COPD (chronic obstructive pulmonary disease) ([place]) 11/13/2011  . Sinus bradycardia 11/13/2011   Past Medical History:  Diagnosis Date  . Colon polyp   . COPD (chronic obstructive pulmonary disease) ([place])   .  Coronary artery disease   . Diverticulosis   . Esophageal stricture   . Hyperlipidemia   . 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 Vitamin D deficiency     Family History  Problem Relation Age of Onset  . Hypertension Father   . Pancreatic cancer Mother   . Cancer Mother   . Colon cancer Brother   . Leukemia Brother   . Prostate cancer Brother   . Other Brother        polio  . Esophageal cancer Neg Hx   . Rectal cancer Neg Hx   . Stomach cancer Neg Hx     Past Surgical History:  Procedure Laterality Date  . CARDIAC CATHETERIZATION  1983   No intervention performed.   . COLONOSCOPY W/ POLYPECTOMY  08/12/2010   Dr. Silvano Rusk  .  ESOPHAGOGASTRODUODENOSCOPY    . INGUINAL HERNIA REPAIR     left  . right leg amputation    . SHOULDER ARTHROSCOPY Left 2000   Social History   Occupational History  . Occupation: retired    Fish farm manager: RETIRED  Tobacco Use  . Smoking status: Former Smoker    Packs/day: 3.00    Years: 20.00    Pack years: 60.00    Types: Cigarettes    Last attempt to quit: 04/11/2000    Years since quitting: 17.4  . Smokeless tobacco: Never Used  Substance and Sexual Activity  . Alcohol use: Not Currently  . Drug use: No  . Sexual activity: Not Currently

## 2017-09-27 ENCOUNTER — Ambulatory Visit (INDEPENDENT_AMBULATORY_CARE_PROVIDER_SITE_OTHER): Payer: Medicare HMO | Admitting: Orthopedic Surgery

## 2017-09-27 ENCOUNTER — Encounter (INDEPENDENT_AMBULATORY_CARE_PROVIDER_SITE_OTHER): Payer: Self-pay | Admitting: Orthopedic Surgery

## 2017-09-27 DIAGNOSIS — Z89511 Acquired absence of right leg below knee: Secondary | ICD-10-CM

## 2017-09-27 DIAGNOSIS — L03115 Cellulitis of right lower limb: Secondary | ICD-10-CM | POA: Diagnosis not present

## 2017-09-27 NOTE — Progress Notes (Signed)
Office Visit Note   Patient: Leslie Cardenas           Date of Birth: April 01, 1941           MRN: [id number] Visit Date: 09/27/2017              Requested by: [name], MD [address] [place], [place] [address] PCP: [name], MD  Chief Complaint  Patient presents with  . Right Leg - Wound Check      HPI: Patient is a 77 year old gentleman who presents in follow-up for cellulitis right transtibial amputation.  Patient states the drainage has stopped yesterday.  Patient denies any odor he is currently on doxycycline he complains of swelling.  Assessment & Plan: Visit Diagnoses:  1. S/P unilateral BKA (below knee amputation), right ([facility])   2. Cellulitis of right leg     Plan: Recommended elevation recommend against soaking his leg in hot water.  Complete the course of doxycycline he is given a prescription to get a stump shrinker at [employer] [facility] [employer] [facility].  Follow-Up Instructions: Return in about 3 weeks (around 10/18/2017).   Ortho Exam  Patient is alert, oriented, no adenopathy, well-dressed, normal affect, normal respiratory effort. Examination patient does have pitting edema in the right transtibial amputation.  Elevation and compression all of the redness resolves there is some superficial blistering to the skin but there is no clinical signs of infection there is no drainage no cellulitis no fluctuance no tenderness to palpation.  All redness resolves with elevation.  Imaging: No results found. No images are attached to the encounter.  Labs: Lab Results  Component Value Date   HGBA1C 6.4 (H) 07/25/2014   HGBA1C 6.1 (H) 01/15/2014   HGBA1C 6.0 (H) 07/05/2013   LABURIC 7.8 02/12/2016    @LABSALLVALUES (HGBA1)@  There is no height or weight on file to calculate BMI.  Orders:  No orders of the defined types were placed in this encounter.  No orders of the defined types were placed in this encounter.    Procedures: No procedures performed  Clinical  Data: No additional findings.  ROS:  All other systems negative, except as noted in the HPI. Review of Systems  Objective: Vital Signs: There were no vitals taken for this visit.  Specialty Comments:  No specialty comments available.  PMFS History: Patient Active Problem List   Diagnosis Date Noted  . Subacute osteomyelitis of right tibia ([facility]) 09/22/2017  . S/P unilateral BKA (below knee amputation), right ([place]) 09/22/2017  . GERD 02/15/2015  . Medicare annual wellness visit, initial 02/15/2015  . 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 Hx of right BKA ([place]) 01/15/2014  . Essential hypertension 07/05/2013  . Prediabetes 07/05/2013  . Vitamin D deficiency 07/05/2013  . Medication management 07/05/2013  . History of colonic polyps 03/30/2012  . Hyperlipidemia 11/13/2011  . COPD (chronic obstructive pulmonary disease) ([place]) 11/13/2011  . Sinus bradycardia 11/13/2011   Past Medical History:  Diagnosis Date  . Colon polyp   . COPD (chronic obstructive pulmonary disease) ([place])   . Coronary artery disease   . Diverticulosis   . Esophageal stricture   . Hyperlipidemia   . 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 Vitamin D deficiency     Family History  Problem Relation Age of Onset  . Hypertension Father   . Pancreatic cancer Mother   . Cancer Mother   . Colon cancer Brother   . Leukemia Brother   . Prostate  cancer Brother   . Other Brother        polio  . Esophageal cancer Neg Hx   . Rectal cancer Neg Hx   . Stomach cancer Neg Hx     Past Surgical History:  Procedure Laterality Date  . CARDIAC CATHETERIZATION  1983   No intervention performed.   . COLONOSCOPY W/ POLYPECTOMY  08/12/2010   Dr. Silvano Rusk  . ESOPHAGOGASTRODUODENOSCOPY    . INGUINAL HERNIA REPAIR     left  . right leg amputation    . SHOULDER ARTHROSCOPY Left 2000   Social History   Occupational History  . Occupation: retired    Fish farm manager: RETIRED  Tobacco Use  . Smoking status: Former Smoker     Packs/day: 3.00    Years: 20.00    Pack years: 60.00    Types: Cigarettes    Last attempt to quit: 04/11/2000    Years since quitting: 17.4  . Smokeless tobacco: Never Used  Substance and Sexual Activity  . Alcohol use: Not Currently  . Drug use: No  . Sexual activity: Not Currently

## 2017-09-29 ENCOUNTER — Ambulatory Visit (INDEPENDENT_AMBULATORY_CARE_PROVIDER_SITE_OTHER): Payer: Medicare HMO | Admitting: Orthopedic Surgery

## 2017-10-07 ENCOUNTER — Ambulatory Visit (INDEPENDENT_AMBULATORY_CARE_PROVIDER_SITE_OTHER): Payer: Medicare HMO | Admitting: Orthopedic Surgery

## 2017-10-07 ENCOUNTER — Encounter (INDEPENDENT_AMBULATORY_CARE_PROVIDER_SITE_OTHER): Payer: Self-pay | Admitting: Orthopedic Surgery

## 2017-10-07 VITALS — Ht 70.0 in | Wt 143.0 lb

## 2017-10-07 DIAGNOSIS — Z89511 Acquired absence of right leg below knee: Secondary | ICD-10-CM | POA: Diagnosis not present

## 2017-10-07 NOTE — Progress Notes (Signed)
Office Visit Note   Patient: Leslie Cardenas           Date of Birth: March 27, 1941           MRN: [id number] Visit Date: 10/07/2017              Requested by: [name], MD [address] [place], [place] [address] PCP: [name], MD  Chief Complaint  Patient presents with  . Right Leg - Follow-up    BKA contusion       HPI: Patient is a 77 year old gentleman who presents with a painful contusion with cellulitis medial aspect of the right transtibial amputation.  Patient states he was trying to get in and out of the truck when he slipped he hyperextended his leg developed bruising medially.  He has been on doxycycline for the and duration.  Patient states he no longer has any drainage but he feels like there is a knot medially.  Patient states he needs a plan of golf tournament in May.  Assessment & Plan: Visit Diagnoses:  1. S/P unilateral BKA (below knee amputation), right ([facility])     Plan: We will have him continue with current antibiotics with doxycycline twice a day Bactroban and an Ace wrap and dry dressing was applied there is no clinical signs of infection.  Follow-Up Instructions: Return in about 1 week (around 10/14/2017).   Ortho Exam  Patient is alert, oriented, no adenopathy, well-dressed, normal affect, normal respiratory effort. Examination patient has some induration medially there is no warmth.  There is an area that appears like a blister but it does have hard skin there is tenderness to palpation over the area of dermatitis.  This is concerning for possible hematoma possible abscess.  After informed consent the area was locally prepped using Betadine locally anesthetized with 1 cc of 1% lidocaine plain 18-gauge needle was inserted and there was no fluid aspirated this area was then opened with a 15 blade knife and clear serosanguineous drainage was drained however there was no purulence no signs of infection.  There is no large hematoma.  Bactroban  4 x 4 and Ace wrap was applied.  Imaging: No results found. No images are attached to the encounter.  Labs: Lab Results  Component Value Date   HGBA1C 6.4 (H) 07/25/2014   HGBA1C 6.1 (H) 01/15/2014   HGBA1C 6.0 (H) 07/05/2013   LABURIC 7.8 02/12/2016    Lab Results  Component Value Date/Time   HGBA1C 6.4 (H) 07/25/2014 10:16 AM   HGBA1C 6.1 (H) 01/15/2014 11:39 AM   HGBA1C 6.0 (H) 07/05/2013 12:51 PM    Body mass index is 20.52 kg/m.  Orders:  No orders of the defined types were placed in this encounter.  No orders of the defined types were placed in this encounter.    Procedures: No procedures performed  Clinical Data: No additional findings.  ROS:  All other systems negative, except as noted in the HPI. Review of Systems  Objective: Vital Signs: Ht 5\' 10"  (1.778 m)   Wt 143 lb (64.9 kg)   BMI 20.52 kg/m   Specialty Comments:  No specialty comments available.  PMFS History: Patient Active Problem List   Diagnosis Date Noted  . Subacute osteomyelitis of right tibia ([place]) 09/22/2017  . S/P unilateral BKA (below knee amputation), right ([place]) 09/22/2017  . GERD 02/15/2015  . Medicare annual wellness visit, initial 02/15/2015  . 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 Hx of  right BKA ([place]) 01/15/2014  . Essential hypertension 07/05/2013  . Prediabetes 07/05/2013  . Vitamin D deficiency 07/05/2013  . Medication management 07/05/2013  . History of colonic polyps 03/30/2012  . Hyperlipidemia 11/13/2011  . COPD (chronic obstructive pulmonary disease) ([facility]) 11/13/2011  . Sinus bradycardia 11/13/2011   Past Medical History:  Diagnosis Date  . Colon polyp   . COPD (chronic obstructive pulmonary disease) ([facility] [place])   . Coronary artery disease   . Diverticulosis   . Esophageal stricture   . Hyperlipidemia   . Traumatic amputation of right leg ([place])   . Vitamin D deficiency     Family History  Problem Relation Age of Onset  . Hypertension Father   . Pancreatic  cancer Mother   . Cancer Mother   . Colon cancer Brother   . Leukemia Brother   . Prostate cancer Brother   . Other Brother        polio  . Esophageal cancer Neg Hx   . Rectal cancer Neg Hx   . Stomach cancer Neg Hx     Past Surgical History:  Procedure Laterality Date  . CARDIAC CATHETERIZATION  1983   No intervention performed.   . COLONOSCOPY W/ POLYPECTOMY  08/12/2010   Dr. Silvano Rusk  . ESOPHAGOGASTRODUODENOSCOPY    . INGUINAL HERNIA REPAIR     left  . right leg amputation    . SHOULDER ARTHROSCOPY Left 2000   Social History   Occupational History  . Occupation: retired    Fish farm manager: RETIRED  Tobacco Use  . Smoking status: Former Smoker    Packs/day: 3.00    Years: 20.00    Pack years: 60.00    Types: Cigarettes    Last attempt to quit: 04/11/2000    Years since quitting: 17.5  . Smokeless tobacco: Never Used  Substance and Sexual Activity  . Alcohol use: Not Currently  . Drug use: No  . Sexual activity: Not Currently

## 2017-10-11 DIAGNOSIS — M9983 Other biomechanical lesions of lumbar region: Secondary | ICD-10-CM | POA: Diagnosis not present

## 2017-10-11 DIAGNOSIS — R030 Elevated blood-pressure reading, without diagnosis of hypertension: Secondary | ICD-10-CM | POA: Diagnosis not present

## 2017-10-11 DIAGNOSIS — M545 Low back pain: Secondary | ICD-10-CM | POA: Diagnosis not present

## 2017-10-11 DIAGNOSIS — Z681 Body mass index (BMI) 19 or less, adult: Secondary | ICD-10-CM | POA: Diagnosis not present

## 2017-10-11 DIAGNOSIS — I10 Essential (primary) hypertension: Secondary | ICD-10-CM | POA: Diagnosis not present

## 2017-10-11 DIAGNOSIS — M5416 Radiculopathy, lumbar region: Secondary | ICD-10-CM | POA: Diagnosis not present

## 2017-10-11 DIAGNOSIS — M4126 Other idiopathic scoliosis, lumbar region: Secondary | ICD-10-CM | POA: Diagnosis not present

## 2017-10-14 ENCOUNTER — Ambulatory Visit (INDEPENDENT_AMBULATORY_CARE_PROVIDER_SITE_OTHER): Payer: Medicare HMO | Admitting: Orthopedic Surgery

## 2017-10-14 ENCOUNTER — Encounter (INDEPENDENT_AMBULATORY_CARE_PROVIDER_SITE_OTHER): Payer: Self-pay | Admitting: Orthopedic Surgery

## 2017-10-14 DIAGNOSIS — L03115 Cellulitis of right lower limb: Secondary | ICD-10-CM

## 2017-10-14 DIAGNOSIS — Z89511 Acquired absence of right leg below knee: Secondary | ICD-10-CM | POA: Diagnosis not present

## 2017-10-14 NOTE — Progress Notes (Signed)
Office Visit Note   Patient: Leslie Cardenas           Date of Birth: 07/03/1940           MRN: [id number] Visit Date: 10/14/2017              Requested by: [name], MD [address] [place], [place] [address] PCP: [name], MD  Chief Complaint  Patient presents with  . Right Leg - Follow-up, Pain      HPI: Patient is a 77 year old gentleman who presents follow-up for cellulitis right transtibial amputation he has been on antibiotics and will go ahead and complete his antibiotic course.  He states he is a little bit of tenderness but is able to wear his prosthesis and denies any drainage at this time.  Assessment & Plan: Visit Diagnoses:  1. S/P unilateral BKA (below knee amputation), right ([facility])   2. Cellulitis of right leg     Plan: Patient will complete his course of antibiotics continue wearing his leg he may resume his normal activities.  Follow-Up Instructions: Return if symptoms worsen or fail to improve.   Ortho Exam  Patient is alert, oriented, no adenopathy, well-dressed, normal affect, normal respiratory effort. Examination patient has a slight antalgic gait.  He has decreased residual volume to the right transtibial amputation.  There is no redness no cellulitis no signs of infection the ulcer has healed there is no drainage.  There is no tenderness to palpation.  Imaging: No results found. No images are attached to the encounter.  Labs: Lab Results  Component Value Date   HGBA1C 6.4 (H) 07/25/2014   HGBA1C 6.1 (H) 01/15/2014   HGBA1C 6.0 (H) 07/05/2013   LABURIC 7.8 02/12/2016     Lab Results  Component Value Date   ALBUMIN 4.2 01/11/2017   ALBUMIN 3.9 06/16/2016   ALBUMIN 4.1 02/12/2016   LABURIC 7.8 02/12/2016    There is no height or weight on file to calculate BMI.  Orders:  No orders of the defined types were placed in this encounter.  No orders of the defined types were placed in this encounter.    Procedures: No procedures performed  Clinical Data: No additional findings.  ROS:  All other systems negative, except as noted in the HPI. Review of Systems  Objective: Vital Signs: There were no vitals taken for this visit.  Specialty Comments:  No specialty comments available.  PMFS History: Patient Active Problem List   Diagnosis Date Noted  . Subacute osteomyelitis of right tibia ([facility]) 09/22/2017  . S/P unilateral BKA (below knee amputation), right ([facility]) 09/22/2017  . GERD 02/15/2015  . Medicare annual wellness visit, initial 02/15/2015  . 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 Hx of right BKA ([place]) 01/15/2014  . Essential hypertension 07/05/2013  . Prediabetes 07/05/2013  . Vitamin D deficiency 07/05/2013  . Medication management 07/05/2013  . History of colonic polyps 03/30/2012  . Hyperlipidemia 11/13/2011  . COPD (chronic obstructive pulmonary disease) ([place] [facility]) 11/13/2011  . Sinus bradycardia 11/13/2011   Past Medical History:  Diagnosis Date  . Colon polyp   . COPD (chronic obstructive pulmonary disease) ([place])   . Coronary artery disease   . Diverticulosis   . Esophageal stricture   . Hyperlipidemia   . Traumatic amputation of right leg ([facility])   . Vitamin D deficiency     Family History  Problem Relation Age of Onset  . Hypertension Father   . Pancreatic  cancer Mother   . Cancer Mother   . Colon cancer Brother   . Leukemia Brother   . Prostate cancer Brother   . Other Brother        polio  . Esophageal cancer Neg Hx   . Rectal cancer Neg Hx   . Stomach cancer Neg Hx     Past Surgical History:  Procedure Laterality Date  . CARDIAC CATHETERIZATION  1983   No intervention performed.   . COLONOSCOPY W/ POLYPECTOMY  08/12/2010   Dr. Silvano Rusk  . ESOPHAGOGASTRODUODENOSCOPY    . INGUINAL HERNIA REPAIR     left  . right leg amputation    . SHOULDER ARTHROSCOPY Left 2000   Social History   Occupational History  . Occupation: retired    Fish farm manager: RETIRED    Tobacco Use  . Smoking status: Former Smoker    Packs/day: 3.00    Years: 20.00    Pack years: 60.00    Types: Cigarettes    Last attempt to quit: 04/11/2000    Years since quitting: 17.5  . Smokeless tobacco: Never Used  Substance and Sexual Activity  . Alcohol use: Not Currently  . Drug use: No  . Sexual activity: Not Currently

## 2017-10-15 ENCOUNTER — Telehealth: Payer: Self-pay | Admitting: Family Medicine

## 2017-10-15 NOTE — Telephone Encounter (Signed)
We received a fax that was put in to [employer] file for medical records from [facility] from [facility].   I have called and asked if patient was aware of them faxing request for his medical records. He said Don't send them anything if he is going to switch pharmacies he will switch to a local pharmacy.   I have returned the request letting them know the request was denied and that the patient didn't want their services.

## 2017-10-19 ENCOUNTER — Other Ambulatory Visit (INDEPENDENT_AMBULATORY_CARE_PROVIDER_SITE_OTHER): Payer: Self-pay | Admitting: Orthopedic Surgery

## 2017-10-19 ENCOUNTER — Ambulatory Visit (INDEPENDENT_AMBULATORY_CARE_PROVIDER_SITE_OTHER): Payer: Medicare HMO | Admitting: Orthopedic Surgery

## 2017-10-19 DIAGNOSIS — M4126 Other idiopathic scoliosis, lumbar region: Secondary | ICD-10-CM | POA: Diagnosis not present

## 2017-10-19 DIAGNOSIS — M545 Low back pain: Secondary | ICD-10-CM | POA: Diagnosis not present

## 2017-10-28 DIAGNOSIS — R030 Elevated blood-pressure reading, without diagnosis of hypertension: Secondary | ICD-10-CM | POA: Diagnosis not present

## 2017-10-28 DIAGNOSIS — M5416 Radiculopathy, lumbar region: Secondary | ICD-10-CM | POA: Diagnosis not present

## 2017-10-28 DIAGNOSIS — M545 Low back pain: Secondary | ICD-10-CM | POA: Diagnosis not present

## 2017-10-28 DIAGNOSIS — M9983 Other biomechanical lesions of lumbar region: Secondary | ICD-10-CM | POA: Diagnosis not present

## 2017-10-28 DIAGNOSIS — M4126 Other idiopathic scoliosis, lumbar region: Secondary | ICD-10-CM | POA: Diagnosis not present

## 2017-12-02 DIAGNOSIS — M5416 Radiculopathy, lumbar region: Secondary | ICD-10-CM | POA: Diagnosis not present

## 2018-01-04 DIAGNOSIS — M5416 Radiculopathy, lumbar region: Secondary | ICD-10-CM | POA: Diagnosis not present

## 2018-02-02 DIAGNOSIS — M545 Low back pain: Secondary | ICD-10-CM | POA: Diagnosis not present

## 2018-02-02 DIAGNOSIS — Z681 Body mass index (BMI) 19 or less, adult: Secondary | ICD-10-CM | POA: Diagnosis not present

## 2018-02-02 DIAGNOSIS — I10 Essential (primary) hypertension: Secondary | ICD-10-CM | POA: Diagnosis not present

## 2018-02-02 DIAGNOSIS — M5416 Radiculopathy, lumbar region: Secondary | ICD-10-CM | POA: Diagnosis not present

## 2018-02-15 ENCOUNTER — Ambulatory Visit (INDEPENDENT_AMBULATORY_CARE_PROVIDER_SITE_OTHER): Payer: Medicare HMO | Admitting: Family Medicine

## 2018-02-15 ENCOUNTER — Encounter: Payer: Self-pay | Admitting: Family Medicine

## 2018-02-15 VITALS — BP 136/86 | HR 82 | Temp 98.4°F | Resp 18 | Ht 70.0 in | Wt 131.0 lb

## 2018-02-15 DIAGNOSIS — R05 Cough: Secondary | ICD-10-CM | POA: Diagnosis not present

## 2018-02-15 DIAGNOSIS — E440 Moderate protein-calorie malnutrition: Secondary | ICD-10-CM | POA: Diagnosis not present

## 2018-02-15 DIAGNOSIS — R053 Chronic cough: Secondary | ICD-10-CM

## 2018-02-15 DIAGNOSIS — R634 Abnormal weight loss: Secondary | ICD-10-CM | POA: Diagnosis not present

## 2018-02-15 DIAGNOSIS — R1310 Dysphagia, unspecified: Secondary | ICD-10-CM | POA: Diagnosis not present

## 2018-02-15 DIAGNOSIS — R1319 Other dysphagia: Secondary | ICD-10-CM

## 2018-02-15 DIAGNOSIS — Z23 Encounter for immunization: Secondary | ICD-10-CM

## 2018-02-15 NOTE — Progress Notes (Signed)
Subjective:    Patient ID: Leslie Cardenas, male    DOB: Jul 21, 1940, 77 y.o.   MRN: [id number]  Patient has a history of an esophageal stricture.  Over the last 6 months, he reports progressive dysphagia to solids.  Quite frequently, solid food will become stuck in his distal esophagus.  He will have to vomit in order to clear the impaction and obstruction.  He denies any dysphasia to liquids.  However he is having to progressively eat less and less solid food in order to avoid abdominal pain and "a blockage".  As result he is lost almost 14 pounds since February.  He denies any hematemesis.  He denies any melena.  He denies any hematochezia.  He denies any fever or chills.  He does report chronic cough and he does have a history of smoking.  He has an early morning cough productive of yellow mucus.  He denies any chest pain associated with it.  He also reports severe fatigue and worsening weakness. Past Medical History:  Diagnosis Date  . Colon polyp   . COPD (chronic obstructive pulmonary disease) ([place])   . Coronary artery disease   . Diverticulosis   . Esophageal stricture   . Hyperlipidemia   . Traumatic amputation of right leg ([place])   . Vitamin D deficiency    Past Surgical History:  Procedure Laterality Date  . CARDIAC CATHETERIZATION  1983   No intervention performed.   . COLONOSCOPY W/ POLYPECTOMY  08/12/2010   Dr. Silvano Rusk  . ESOPHAGOGASTRODUODENOSCOPY    . INGUINAL HERNIA REPAIR     left  . right leg amputation    . SHOULDER ARTHROSCOPY Left 2000   Current Outpatient Medications on File Prior to Visit  Medication Sig Dispense Refill  . albuterol (PROVENTIL HFA;VENTOLIN HFA) 108 (90 Base) MCG/ACT inhaler Inhale 2 puffs into the lungs every 6 (six) hours as needed for wheezing or shortness of breath. (Patient not taking: Reported on 02/15/2018) 1 Inhaler 0  . atorvastatin (LIPITOR) 20 MG tablet Take 1 tablet (20 mg total) by mouth daily. (Patient not taking: Reported on  02/15/2018) 90 tablet 3  . losartan (COZAAR) 50 MG tablet Take 1 tablet (50 mg total) by mouth daily. (Patient not taking: Reported on 02/15/2018) 90 tablet 3  . nitroGLYCERIN (NITROSTAT) 0.4 MG SL tablet Place 1 tablet (0.4 mg total) under the tongue every 5 (five) minutes x 3 doses as needed for chest pain. (Patient not taking: Reported on 02/15/2018) 25 tablet 4  . omeprazole (PRILOSEC) 20 MG capsule Take 1 capsule (20 mg total) by mouth daily before breakfast. (Patient not taking: Reported on 02/15/2018) 90 capsule 3  . predniSONE (DELTASONE) 5 MG tablet TAKE 1 TABLET BY MOUTH EVERY OTHER DAY (Patient not taking: Reported on 02/15/2018) 30 tablet 0  . triamcinolone cream (KENALOG) 0.1 % Apply 1 application topically 2 (two) times daily. (Patient not taking: Reported on 02/15/2018) 30 g 0   Current Facility-Administered Medications on File Prior to Visit  Medication Dose Route Frequency Provider Last Rate Last Dose  . 0.9 %  sodium chloride infusion  500 mL Intravenous Continuous Gatha Mayer, MD       No Known Allergies Social History   Socioeconomic History  . Marital status: Divorced    Spouse name: Not on file  . Number of children: 4  . Years of education: Not on file  . Highest education level: Not on file  Occupational History  . Occupation: retired  Employer: RETIRED  Social Needs  . Financial resource strain: Not on file  . Food insecurity:    Worry: Not on file    Inability: Not on file  . Transportation needs:    Medical: Not on file    Non-medical: Not on file  Tobacco Use  . Smoking status: Former Smoker    Packs/day: 3.00    Years: 20.00    Pack years: 60.00    Types: Cigarettes    Last attempt to quit: 04/11/2000    Years since quitting: 17.8  . Smokeless tobacco: Never Used  Substance and Sexual Activity  . Alcohol use: Not Currently  . Drug use: No  . Sexual activity: Not Currently  Lifestyle  . Physical activity:    Days per week: Not on file     Minutes per session: Not on file  . Stress: Not on file  Relationships  . Social connections:    Talks on phone: Not on file    Gets together: Not on file    Attends religious service: Not on file    Active member of club or organization: Not on file    Attends meetings of clubs or organizations: Not on file    Relationship status: Not on file  . Intimate partner violence:    Fear of current or ex partner: Not on file    Emotionally abused: Not on file    Physically abused: Not on file    Forced sexual activity: Not on file  Other Topics Concern  . Not on file  Social History Narrative   Lives in [place], [place].    Family History  Problem Relation Age of Onset  . Hypertension Father   . Pancreatic cancer Mother   . Cancer Mother   . Colon cancer Brother   . Leukemia Brother   . Prostate cancer Brother   . Other Brother        polio  . Esophageal cancer Neg Hx   . Rectal cancer Neg Hx   . Stomach cancer Neg Hx       Review of Systems  All other systems reviewed and are negative.      Objective:   Physical Exam  Constitutional: He is oriented to person, place, and time. He appears well-developed and well-nourished. No distress.  Eyes: Pupils are equal, round, and reactive to light. Conjunctivae and EOM are normal. Right eye exhibits no discharge. Left eye exhibits no discharge.  Neck: Normal range of motion. Neck supple. No JVD present.  Cardiovascular: Normal rate, regular rhythm, normal heart sounds and intact distal pulses. Exam reveals no gallop and no friction rub.  No murmur heard. Pulmonary/Chest: Effort normal and breath sounds normal. No respiratory distress. He has no wheezes. He has no rales. He exhibits no tenderness.  Abdominal: Soft. Bowel sounds are normal. He exhibits no distension and no mass. There is no tenderness. There is no rebound and no guarding.  Musculoskeletal: Normal range of motion. He exhibits no edema, tenderness or deformity.    Neurological: He is alert and oriented to person, place, and time. He has normal reflexes. No cranial nerve deficit. He exhibits normal muscle tone. Coordination normal.  Skin: Skin is warm. Rash noted. He is not diaphoretic. No erythema. No pallor.  Psychiatric: He has a normal mood and affect. His behavior is normal. Judgment and thought content normal.  Vitals reviewed.         Assessment & Plan:  Moderate protein-calorie malnutrition (  [place]) - Plan: CBC with Differential/Platelet, COMPLETE METABOLIC PANEL WITH GFR, Vitamin B12, TSH, VITAMIN D 25 Hydroxy (Vit-D Deficiency, Fractures)  Chronic cough - Plan: DG Chest 2 View  Need for prophylactic vaccination and inoculation against influenza - Plan: Flu Vaccine QUAD 36+ mos IM  Esophageal dysphagia - Plan: Ambulatory referral to Gastroenterology  Weight loss - Plan: Ambulatory referral to Gastroenterology  There is a mechanical obstruction in his distal esophagus.  Differential diagnosis includes esophageal stricture, Schatzki's ring, or obstructive mass.  Given his weight loss, I believe he needs EGD as soon as possible to diagnose and hopefully treat the obstruction.  Meanwhile I will check lab work to evaluate for any vitamin or mineral deficiencies that his restrictive diet has created over the last few months.  I have recommended Ensure 1 can 3 times daily with meals.  Given his chronic cough and his smoking, I would also obtain a chest x-ray.  Patient was given a flu shot today.  Await the results of his EGD as soon as possible.

## 2018-02-16 ENCOUNTER — Ambulatory Visit
Admission: RE | Admit: 2018-02-16 | Discharge: 2018-02-16 | Disposition: A | Discharge status: Home / Self Care | Payer: Medicare HMO | Source: Ambulatory Visit | Attending: Family Medicine | Admitting: Family Medicine

## 2018-02-16 DIAGNOSIS — R05 Cough: Secondary | ICD-10-CM

## 2018-02-16 DIAGNOSIS — R053 Chronic cough: Secondary | ICD-10-CM

## 2018-02-16 LAB — CBC WITH DIFFERENTIAL/PLATELET
BASOS ABS: 88 {cells}/uL (ref 0–200)
Basophils Relative: 1.3 %
Eosinophils Absolute: 687 cells/uL — ABNORMAL HIGH (ref 15–500)
Eosinophils Relative: 10.1 %
HEMATOCRIT: 40.6 % (ref 38.5–50.0)
HEMOGLOBIN: 14.5 g/dL (ref 13.2–17.1)
LYMPHS ABS: 1115 {cells}/uL (ref 850–3900)
MCH: 33.3 pg — ABNORMAL HIGH (ref 27.0–33.0)
MCHC: 35.7 g/dL (ref 32.0–36.0)
MCV: 93.3 fL (ref 80.0–100.0)
MPV: 9.5 fL (ref 7.5–12.5)
Monocytes Relative: 9.5 %
NEUTROS ABS: 4264 {cells}/uL (ref 1500–7800)
Neutrophils Relative %: 62.7 %
Platelets: 369 10*3/uL (ref 140–400)
RBC: 4.35 10*6/uL (ref 4.20–5.80)
RDW: 12.4 % (ref 11.0–15.0)
Total Lymphocyte: 16.4 %
WBC: 6.8 10*3/uL (ref 3.8–10.8)
WBCMIX: 646 {cells}/uL (ref 200–950)

## 2018-02-16 LAB — COMPLETE METABOLIC PANEL WITH GFR
AG RATIO: 1.7 (calc) (ref 1.0–2.5)
ALBUMIN MSPROF: 4 g/dL (ref 3.6–5.1)
ALT: 13 U/L (ref 9–46)
AST: 21 U/L (ref 10–35)
Alkaline phosphatase (APISO): 72 U/L (ref 40–115)
BUN: 14 mg/dL (ref 7–25)
CALCIUM: 9.7 mg/dL (ref 8.6–10.3)
CO2: 29 mmol/L (ref 20–32)
Chloride: 105 mmol/L (ref 98–110)
Creat: 1.07 mg/dL (ref 0.70–1.18)
GFR, Est African American: 77 mL/min/{1.73_m2} (ref >=60)
GFR, Est Non African American: 66 mL/min/{1.73_m2} (ref >=60)
GLOBULIN: 2.4 g/dL (ref 1.9–3.7)
Glucose, Bld: 89 mg/dL (ref 65–99)
POTASSIUM: 4.4 mmol/L (ref 3.5–5.3)
SODIUM: 139 mmol/L (ref 135–146)
Total Bilirubin: 0.3 mg/dL (ref 0.2–1.2)
Total Protein: 6.4 g/dL (ref 6.1–8.1)

## 2018-02-16 LAB — VITAMIN B12: Vitamin B-12: 582 pg/mL (ref 200–1100)

## 2018-02-16 LAB — VITAMIN D 25 HYDROXY (VIT D DEFICIENCY, FRACTURES): VIT D 25 HYDROXY: 33 ng/mL (ref 30–100)

## 2018-02-16 LAB — TSH: TSH: 1.03 m[IU]/L (ref 0.40–4.50)

## 2018-02-17 ENCOUNTER — Encounter: Payer: Self-pay | Admitting: Family Medicine

## 2018-02-17 ENCOUNTER — Ambulatory Visit (INDEPENDENT_AMBULATORY_CARE_PROVIDER_SITE_OTHER): Payer: Medicare HMO | Admitting: Family Medicine

## 2018-02-17 ENCOUNTER — Other Ambulatory Visit: Payer: Self-pay

## 2018-02-17 VITALS — BP 130/62 | HR 70 | Temp 97.5°F | Resp 18 | Ht 70.0 in | Wt 130.0 lb

## 2018-02-17 DIAGNOSIS — R0602 Shortness of breath: Secondary | ICD-10-CM

## 2018-02-17 DIAGNOSIS — J441 Chronic obstructive pulmonary disease with (acute) exacerbation: Secondary | ICD-10-CM | POA: Diagnosis not present

## 2018-02-17 DIAGNOSIS — J439 Emphysema, unspecified: Secondary | ICD-10-CM | POA: Diagnosis not present

## 2018-02-17 DIAGNOSIS — J069 Acute upper respiratory infection, unspecified: Secondary | ICD-10-CM

## 2018-02-17 DIAGNOSIS — R69 Illness, unspecified: Secondary | ICD-10-CM | POA: Diagnosis not present

## 2018-02-17 DIAGNOSIS — R05 Cough: Secondary | ICD-10-CM

## 2018-02-17 MED ORDER — BENZONATATE 100 MG PO CAPS: 100.0000 mg | ORAL_CAPSULE | Freq: Three times a day (TID) | ORAL | 0 refills | Status: DC | PRN

## 2018-02-17 MED ORDER — NEBULIZER/TUBING/MOUTHPIECE KIT: PACK | 0 refills | Status: DC

## 2018-02-17 MED ORDER — ALBUTEROL SULFATE (2.5 MG/3ML) 0.083% IN NEBU: 2.5000 mg | INHALATION_SOLUTION | RESPIRATORY_TRACT | 1 refills | Status: DC | PRN

## 2018-02-17 MED ORDER — AZITHROMYCIN 250 MG PO TABS: ORAL_TABLET | ORAL | 0 refills | Status: DC

## 2018-02-17 MED ORDER — METHYLPREDNISOLONE ACETATE 40 MG/ML IJ SUSP
40.0000 mg | Freq: Once | INTRAMUSCULAR | Status: AC
  Administered 2018-02-17: 40 mg via INTRAMUSCULAR

## 2018-02-17 MED ORDER — ALBUTEROL SULFATE HFA 108 (90 BASE) MCG/ACT IN AERS: 2.0000 | INHALATION_SPRAY | RESPIRATORY_TRACT | 0 refills | Status: DC | PRN

## 2018-02-17 MED ORDER — PREDNISONE 20 MG PO TABS: 40.0000 mg | ORAL_TABLET | Freq: Every day | ORAL | 0 refills | Status: AC

## 2018-02-17 MED ORDER — IPRATROPIUM-ALBUTEROL 0.5-2.5 (3) MG/3ML IN SOLN
3.0000 mL | Freq: Once | RESPIRATORY_TRACT | Status: AC
  Administered 2018-02-17: 3 mL via RESPIRATORY_TRACT

## 2018-02-17 NOTE — Progress Notes (Signed)
Patient ID: Leslie Cardenas, male    DOB: 1940/09/18, 77 y.o.   MRN: [id number]  PCP: [name], MD  Chief Complaint  Patient presents with  . Illness    x2 days- fever/ chills, nasal drainage, nonproductive cough, SOB/ Wheezing    Subjective:   Leslie Cardenas is a 77 y.o. male, presents to clinic with CC of 2 days of profuse nasal drainage, non-productive cough with wheeze and SOB. Cough  This is a new problem. The current episode started in the past 7 days. The problem has been rapidly worsening. The problem occurs constantly. The cough is productive of purulent sputum. Associated symptoms include chills, a fever, nasal congestion, postnasal drip, rhinorrhea, a sore throat, shortness of breath, sweats and wheezing. Pertinent negatives include no chest pain, ear congestion, ear pain, headaches, heartburn, hemoptysis, myalgias, rash or weight loss. Exacerbated by: walking, talking, deep breathing. Risk factors for lung disease include smoking/tobacco exposure (former smoker). He has tried steroid inhaler for the symptoms. The treatment provided no relief. His past medical history is significant for bronchitis, COPD and emphysema.   Hx of emphysema, COPD, former smoker Was in clinic two days ago, CXR done, flu injection done, possible sick contacts He has a chronic cough but this is much worse than baseline with increased frequency, increased sputum production.  Worse SOB and wheeze. Not currently doing daily maintenance inhaler or SABA due to cost     Patient Active Problem List   Diagnosis Date Noted  . Subacute osteomyelitis of right tibia ([place]) 09/22/2017  . S/P unilateral BKA (below knee amputation), right ([place]) 09/22/2017  . GERD 02/15/2015  . Medicare annual wellness visit, initial 02/15/2015  . 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 Hx of right BKA ([place]) 01/15/2014  . Essential hypertension 07/05/2013  . Prediabetes 07/05/2013  . Vitamin D deficiency 07/05/2013  . Medication  management 07/05/2013  . History of colonic polyps 03/30/2012  . Hyperlipidemia 11/13/2011  . COPD (chronic obstructive pulmonary disease) ([place]) 11/13/2011  . Sinus bradycardia 11/13/2011     Prior to Admission medications   Medication Sig Start Date End Date Taking? Authorizing Provider  albuterol (PROVENTIL HFA;VENTOLIN HFA) 108 (90 Base) MCG/ACT inhaler Inhale 2 puffs into the lungs every 6 (six) hours as needed for wheezing or shortness of breath. 09/04/15  Yes Dena Billet B, PA-C  atorvastatin (LIPITOR) 20 MG tablet Take 1 tablet (20 mg total) by mouth daily. 01/14/17  Yes [name], MD  losartan (COZAAR) 50 MG tablet Take 1 tablet (50 mg total) by mouth daily. 01/14/17  Yes [name], MD  nitroGLYCERIN (NITROSTAT) 0.4 MG SL tablet Place 1 tablet (0.4 mg total) under the tongue every 5 (five) minutes x 3 doses as needed for chest pain. 11/14/11  Yes Dunn, Nedra Hai, PA-C  omeprazole (PRILOSEC) 20 MG capsule Take 1 capsule (20 mg total) by mouth daily before breakfast. 06/18/16  Yes Gatha Mayer, MD  predniSONE (DELTASONE) 5 MG tablet TAKE 1 TABLET BY MOUTH EVERY OTHER DAY 09/20/17  Yes [name], MD  triamcinolone cream (KENALOG) 0.1 % Apply 1 application topically 2 (two) times daily. 08/06/17  Yes [name], MD     No Known Allergies   Family History  Problem Relation Age of Onset  . Hypertension Father   . Pancreatic cancer Mother   . Cancer Mother   . Colon cancer Brother   . Leukemia Brother   . Prostate cancer Brother   .  Other Brother        polio  . Esophageal cancer Neg Hx   . Rectal cancer Neg Hx   . Stomach cancer Neg Hx      Review of Systems  Constitutional: Positive for chills and fever. Negative for weight loss.  HENT: Positive for postnasal drip, rhinorrhea and sore throat. Negative for ear pain.   Eyes: Negative.   Respiratory: Positive for cough, shortness of breath and wheezing. Negative for hemoptysis.   Cardiovascular:  Negative.  Negative for chest pain.  Gastrointestinal: Negative.  Negative for heartburn.  Endocrine: Negative.   Genitourinary: Negative.   Musculoskeletal: Negative.  Negative for myalgias.  Skin: Negative.  Negative for rash.  Allergic/Immunologic: Negative.   Neurological: Negative.  Negative for headaches.  Hematological: Negative.   Psychiatric/Behavioral: Negative.   All other systems reviewed and are negative. 10 Systems reviewed and are negative for acute change except as noted in the HPI.      Objective:    Vitals:   02/17/18 1032  BP: 130/62  Pulse: 70  Resp: 18  Temp: (!) 97.5 F (36.4 C)  TempSrc: Oral  SpO2: 97%  Weight: 130 lb (59 kg)  Height: 5\' 10"  (1.778 m)      Physical Exam  Constitutional: He appears well-developed. No distress.  Severe coughing and mild SOB with tachypnea, pt appears mildly ill, but non-toxic, elderly male, thin  HENT:  Head: Normocephalic and atraumatic.  Right Ear: Tympanic membrane, external ear and ear canal normal.  Left Ear: Tympanic membrane, external ear and ear canal normal.  Nose: Mucosal edema and rhinorrhea present.  Mouth/Throat: Uvula is midline and mucous membranes are normal. Mucous membranes are not pale, not dry and not cyanotic. Posterior oropharyngeal erythema present. No oropharyngeal exudate, posterior oropharyngeal edema or tonsillar abscesses.  Eyes: Conjunctivae are normal. Right eye exhibits no discharge. Left eye exhibits no discharge.  Neck: No tracheal deviation present.  Cardiovascular: Normal rate, regular rhythm, normal heart sounds and intact distal pulses. Exam reveals no friction rub.  No murmur heard. Pulmonary/Chest: Effort normal. No stridor. Tachypnea noted. No respiratory distress. He has decreased breath sounds. He has wheezes. He has rhonchi. He has no rales. He exhibits no tenderness.  Musculoskeletal: Normal range of motion.  Neurological: He is alert. He exhibits normal muscle tone.  Coordination normal.  Skin: Skin is warm and dry. Capillary refill takes less than 2 seconds. No rash noted. He is not diaphoretic. No cyanosis. No pallor.  Psychiatric: He has a normal mood and affect. His behavior is normal.  Nursing note and vitals reviewed.         Assessment & Plan:      ICD-10-CM   1. Acute exacerbation of chronic obstructive pulmonary disease (COPD) (HCC) J44.1 ipratropium-albuterol (DUONEB) 0.5-2.5 (3) MG/3ML nebulizer solution 3 mL    methylPREDNISolone acetate (DEPO-MEDROL) injection 40 mg    Check Pulse Oximetry while ambulating  2. Upper respiratory tract infection, unspecified type J06.9 ipratropium-albuterol (DUONEB) 0.5-2.5 (3) MG/3ML nebulizer solution 3 mL  3. Shortness of breath R06.02 Check Pulse Oximetry while ambulating  4. Pulmonary emphysema, unspecified emphysema type ([place]) J43.9     Duoneb administered vitals repeated at completion of tx RR 18, SpO2 97% Lungs reexamined - cleared wheeze, no rales, diminished BS throughout consistent with emphysema  Ambulatory pulse ox 96-97%, become mildly SOB with 2 laps around clinic, was able to speak in short sentences, no retractions, no accessory muscle use.   Sample of symbicort for  baseline COPD - per chart emphysema type Try to get albuterol nebs and machine Steroid burst Abx coverage for lung disease and multiple other comorbidities No recent hospitalizations - can start with Zpak, recheck early next week.   ER precautions reviewed and pt verbalized understanding  Pt has Dx of COPD, emphysema, former smoker with 60 pack year hx, presents with increased WOB due to URI and acute bronchitis with acute exacerbation of COPD.  He required nebulizer machine and neb treatments with his difficulty breathing and hx of disease to adequately tx pt.   Delsa Grana, PA-C 02/17/18 10:49 AM

## 2018-02-17 NOTE — Patient Instructions (Addendum)
Use symbicort maintenance inhaler every day, 2 puffs twice a day - rinse your mouth out and spit  Do albuterol as needed  Go to [employer] later today to get machine  Today start using breathing treatments, antibiotics and mucinex.  Tomorrow start oral steroid pills.  If you have worsening shortness of breath, wheeze, or new chest pain, go to the ER or call 911.  If you get confused or feel like you may pass out, go to the ER or call 911.  Chronic Obstructive Pulmonary Disease Exacerbation Chronic obstructive pulmonary disease (COPD) is a common lung problem. In COPD, the flow of air from the lungs is limited. COPD exacerbations are times that breathing gets worse and you need extra treatment. Without treatment they can be life threatening. If they happen often, your lungs can become more damaged. If your COPD gets worse, your doctor may treat you with:  Medicines.  Oxygen.  Different ways to clear your airway, such as using a mask.  Follow these instructions at home:  Do not smoke.  Avoid tobacco smoke and other things that bother your lungs.  If given, take your antibiotic medicine as told. Finish the medicine even if you start to feel better.  Only take medicines as told by your doctor.  Drink enough fluids to keep your pee (urine) clear or pale yellow (unless your doctor has told you not to).  Use a cool mist machine (vaporizer).  If you use oxygen or a machine that turns liquid medicine into a mist (nebulizer), continue to use them as told.  Keep up with shots (vaccinations) as told by your doctor.  Exercise regularly.  Eat healthy foods.  Keep all doctor visits as told. Get help right away if:  You are very short of breath and it gets worse.  You have trouble talking.  You have bad chest pain.  You have blood in your spit (sputum).  You have a fever.  You keep throwing up (vomiting).  You feel weak, or you pass out (faint).  You feel  confused.  You keep getting worse. This information is not intended to replace advice given to you by your health care provider. Make sure you discuss any questions you have with your health care provider. Document Released: 05/14/2011 Document Revised: 10/31/2015 Document Reviewed: 01/27/2013 Elsevier Interactive Patient Education  2017 [employer].

## 2018-02-23 ENCOUNTER — Encounter: Payer: Self-pay | Admitting: Physician Assistant

## 2018-02-23 ENCOUNTER — Ambulatory Visit: Payer: Medicare HMO | Admitting: Physician Assistant

## 2018-02-23 VITALS — BP 130/76 | HR 63 | Ht 70.0 in | Wt 130.0 lb

## 2018-02-23 DIAGNOSIS — R1314 Dysphagia, pharyngoesophageal phase: Secondary | ICD-10-CM

## 2018-02-23 DIAGNOSIS — R634 Abnormal weight loss: Secondary | ICD-10-CM | POA: Diagnosis not present

## 2018-02-23 MED ORDER — OMEPRAZOLE 40 MG PO CPDR: 40.0000 mg | DELAYED_RELEASE_CAPSULE | Freq: Every day | ORAL | 3 refills | Status: DC

## 2018-02-23 NOTE — Addendum Note (Signed)
Addended by: Wyline Beady on: 02/23/2018 11:17 AM   Modules accepted: Orders

## 2018-02-23 NOTE — Patient Instructions (Signed)
You have been scheduled for an endoscopy. Please follow written instructions given to you at your visit today. If you use inhalers (even only as needed), please bring them with you on the day of your procedure. Your physician has requested that you go to www.startemmi.com and enter the access code given to you at your visit today. This web site gives a general overview about your procedure. However, you should still follow specific instructions given to you by our office regarding your preparation for the procedure.  Continue Ensure 3-4 times a day

## 2018-02-23 NOTE — Progress Notes (Signed)
Chief Complaint: Weight loss and Dysphagia  HPI:    Leslie Cardenas is a 77 year old male known to Dr. Carlean Purl, with a past medical history as listed below including CAD and COPD, recent echo 08/09/2017 with LVEF 55-60%, who presents to clinic today with a complaint of dysphagia and weight loss.    06/18/2016 office visit with Dr. Arelia Longest.  Discussed history of dilation of esophageal stricture in 2013 and 3 adenomas removed from: 2012.  Patient described intermittent solid food dysphagia x10 months and had stopped his PPI for unclear reasons.  At that time patient was told to chew well and avoid dysphagia triggers.  His PPI was restarted, Omeprazole 20 mg daily and patient was scheduled for an EGD with dilation and surveillance colonoscopy.    07/16/2016 colonoscopy with one 5 mm polyp in the sigmoid colon, removed with a cold snare, diverticulosis in the sigmoid colon and otherwise normal exam.  Repeat was recommended due to age.  EGD showed benign-appearing esophageal stenosis which was dilated to 4m as well as a small hiatal hernia and exam was otherwise normal.    Today, patient presents to clinic and explains that he has had continued problems when he eats with food getting stuck on its way down.  Tells me that when "I eat most anything, it hurts", things get stuck and he stops eating.  Due to this the patient believes he has lost around 30 pounds over the past year.  Was supplementing his diet with one Ensure a day but has recently increased to 3-4/day over the past week or so per his primary care provider to try and put on some weight.  Tells me the last time he had his esophagus dilated it only felt better for about a week, but also tells me that he has not been taking his Omeprazole 40 mg daily as prescribed.  He was not really sure what this medicine was for.  Associated symptoms include some epigastric pain.    Denies fever, chills, nausea, vomiting, heartburn, reflux or change in bowel  habits.   Past Medical History:  Diagnosis Date  . Colon polyp   . COPD (chronic obstructive pulmonary disease) ([place]   . Coronary artery disease   . Diverticulosis   . Esophageal stricture   . Hyperlipidemia   . Traumatic amputation of right leg ([place]   . Vitamin D deficiency     Past Surgical History:  Procedure Laterality Date  . CARDIAC CATHETERIZATION  1983   No intervention performed.   . COLONOSCOPY W/ POLYPECTOMY  08/12/2010   Dr. CSilvano Rusk . ESOPHAGOGASTRODUODENOSCOPY    . INGUINAL HERNIA REPAIR     left  . right leg amputation    . SHOULDER ARTHROSCOPY Left 2000    Current Outpatient Medications  Medication Sig Dispense Refill  . albuterol (PROVENTIL HFA;VENTOLIN HFA) 108 (90 Base) MCG/ACT inhaler Inhale 2 puffs into the lungs every 6 (six) hours as needed for wheezing or shortness of breath. 1 Inhaler 0  . albuterol (PROVENTIL) (2.5 MG/3ML) 0.083% nebulizer solution Take 3 mLs (2.5 mg total) by nebulization every 4 (four) hours as needed for wheezing or shortness of breath. 150 mL 1  . atorvastatin (LIPITOR) 20 MG tablet Take 1 tablet (20 mg total) by mouth daily. 90 tablet 3  . losartan (COZAAR) 50 MG tablet Take 1 tablet (50 mg total) by mouth daily. 90 tablet 3  . nitroGLYCERIN (NITROSTAT) 0.4 MG SL tablet Place 1 tablet (0.4 mg total) under  the tongue every 5 (five) minutes x 3 doses as needed for chest pain. 25 tablet 4  . Respiratory Therapy Supplies (NEBULIZER/TUBING/MOUTHPIECE) KIT Disp one nebulizer machine, tubing set and mouthpiece kit 1 each 0  . triamcinolone cream (KENALOG) 0.1 % Apply 1 application topically 2 (two) times daily. 30 g 0   Current Facility-Administered Medications  Medication Dose Route Frequency Provider Last Rate Last Dose  . 0.9 %  sodium chloride infusion  500 mL Intravenous Continuous Leslie Mayer, MD        Allergies as of 02/23/2018  . (No Known Allergies)    Family History  Problem Relation Age of Onset  .  Hypertension Father   . Pancreatic cancer Mother   . Cancer Mother   . Colon cancer Brother   . Leukemia Brother   . Prostate cancer Brother   . Other Brother        polio  . Esophageal cancer Neg Hx   . Rectal cancer Neg Hx   . Stomach cancer Neg Hx     Social History   Socioeconomic History  . Marital status: Divorced    Spouse name: Not on file  . Number of children: 4  . Years of education: Not on file  . Highest education level: Not on file  Occupational History  . Occupation: retired    Fish farm manager: RETIRED  Social Needs  . Financial resource strain: Not on file  . Food insecurity:    Worry: Not on file    Inability: Not on file  . Transportation needs:    Medical: Not on file    Non-medical: Not on file  Tobacco Use  . Smoking status: Former Smoker    Packs/day: 3.00    Years: 20.00    Pack years: 60.00    Types: Cigarettes    Last attempt to quit: 04/11/2000    Years since quitting: 17.8  . Smokeless tobacco: Never Used  Substance and Sexual Activity  . Alcohol use: Not Currently  . Drug use: No  . Sexual activity: Not Currently  Lifestyle  . Physical activity:    Days per week: Not on file    Minutes per session: Not on file  . Stress: Not on file  Relationships  . Social connections:    Talks on phone: Not on file    Gets together: Not on file    Attends religious service: Not on file    Active member of club or organization: Not on file    Attends meetings of clubs or organizations: Not on file    Relationship status: Not on file  . Intimate partner violence:    Fear of current or ex partner: Not on file    Emotionally abused: Not on file    Physically abused: Not on file    Forced sexual activity: Not on file  Other Topics Concern  . Not on file  Social History Narrative   Lives in [place], [place].     Review of Systems:    Constitutional: No weight loss, fever or chills Cardiovascular: No chest pain Respiratory: No SOB   Gastrointestinal: See HPI and otherwise negative   Physical Exam:  Vital signs: BP 130/76   Pulse 63   Ht 5' 10"  (1.778 m)   Wt 130 lb (59 kg)   BMI 18.65 kg/m   Constitutional:   Pleasant thin appearing Caucasian male appears to be in NAD, Well developed, Well nourished, alert and cooperative Head:  Normocephalic and atraumatic. Eyes:   PEERL, EOMI. No icterus. Conjunctiva pink. Ears:  Normal auditory acuity. Neck:  Supple Throat: Oral cavity and pharynx without inflammation, swelling or lesion.  Respiratory: Respirations even and unlabored. Lungs clear to auscultation bilaterally.   No wheezes, crackles, or rhonchi.  Cardiovascular: Normal S1, S2. No MRG. Regular rate and rhythm. No peripheral edema, cyanosis or pallor.  Gastrointestinal:  Soft, nondistended, nontender. No rebound or guarding. Normal bowel sounds. No appreciable masses or hepatomegaly. Rectal:  Not performed.  Msk:  Symmetrical without gross deformities. Without edema, no deformity or joint abnormality.  Neurologic:  Alert and  oriented x4;  grossly normal neurologically.  Skin:   Dry and intact without significant lesions or rashes. Psychiatric: Demonstrates good judgement and reason without abnormal affect or behaviors.  MOST RECENT LABS AND IMAGING: CBC    Component Value Date/Time   WBC 6.8 02/15/2018 1623   RBC 4.35 02/15/2018 1623   HGB 14.5 02/15/2018 1623   HCT 40.6 02/15/2018 1623   PLT 369 02/15/2018 1623   MCV 93.3 02/15/2018 1623   MCH 33.3 (H) 02/15/2018 1623   MCHC 35.7 02/15/2018 1623   RDW 12.4 02/15/2018 1623   LYMPHSABS 1,115 02/15/2018 1623   MONOABS 800 01/11/2017 1202   EOSABS 687 (H) 02/15/2018 1623   BASOSABS 88 02/15/2018 1623    CMP     Component Value Date/Time   NA 139 02/15/2018 1623   K 4.4 02/15/2018 1623   CL 105 02/15/2018 1623   CO2 29 02/15/2018 1623   GLUCOSE 89 02/15/2018 1623   BUN 14 02/15/2018 1623   CREATININE 1.07 02/15/2018 1623   CALCIUM 9.7  02/15/2018 1623   PROT 6.4 02/15/2018 1623   ALBUMIN 4.2 01/11/2017 1202   AST 21 02/15/2018 1623   ALT 13 02/15/2018 1623   ALKPHOS 64 01/11/2017 1202   BILITOT 0.3 02/15/2018 1623   GFRNONAA 66 02/15/2018 1623   GFRAA 77 02/15/2018 1623    Assessment: 1.  Dysphagia: Last EGD only helped for about a week, now with food getting stuck, this is painful per patient, not on Omeprazole as prescribed; likely repeat stricture 2.  Weight loss: 30 pounds over the past year, patient believes it is because every time he eats it hurts and food gets stuck and he stops eating  Plan: 1.  Scheduled patient for an EGD with dilation in the [place].  He requested a sooner appointment.  Scheduled him tomorrow with Dr. Loletha Carrow.  Did discuss risk, benefits, limitations alternatives and the patient agrees to proceed.  Patient will follow with Dr. Carlean Purl, his primary GI physician per recommendations after time of procedure tomorrow. 2.  Reviewed anti-dysphagia measures including taking small bites, drinking sips of water in between bites, avoiding distraction while eating and the chin tuck technique. 3.  Discussed with the patient that he needs to be on his Omeprazole daily.  Prescribed Omeprazole 40 mg daily, 30-60 minutes before breakfast #30 with 11 refills. 4.  Continue Ensure 3-4 times per day  5.  Patient to follow in clinic per recommendations from Dr. Loletha Carrow after time of procedure tomorrow.  Leslie Newer, PA-C [facility] 02/23/2018, 10:39 AM  Cc: [name], MD

## 2018-02-23 NOTE — Progress Notes (Signed)
Thank you for sending this case to me. I have reviewed the entire note, and the outlined plan seems appropriate.  I will get his procedure done and then inform Dr. Carlean Purl.  Wilfrid Lund, MD

## 2018-02-24 ENCOUNTER — Ambulatory Visit (AMBULATORY_SURGERY_CENTER): Payer: Medicare HMO | Admitting: Gastroenterology

## 2018-02-24 ENCOUNTER — Encounter: Payer: Self-pay | Admitting: Gastroenterology

## 2018-02-24 VITALS — BP 131/77 | HR 63 | Temp 97.5°F | Resp 14 | Ht 70.0 in | Wt 130.0 lb

## 2018-02-24 DIAGNOSIS — R1314 Dysphagia, pharyngoesophageal phase: Secondary | ICD-10-CM | POA: Diagnosis not present

## 2018-02-24 DIAGNOSIS — R634 Abnormal weight loss: Secondary | ICD-10-CM | POA: Diagnosis not present

## 2018-02-24 DIAGNOSIS — K2940 Chronic atrophic gastritis without bleeding: Secondary | ICD-10-CM

## 2018-02-24 DIAGNOSIS — K2950 Unspecified chronic gastritis without bleeding: Secondary | ICD-10-CM | POA: Diagnosis not present

## 2018-02-24 DIAGNOSIS — K222 Esophageal obstruction: Secondary | ICD-10-CM | POA: Diagnosis not present

## 2018-02-24 DIAGNOSIS — R1310 Dysphagia, unspecified: Secondary | ICD-10-CM | POA: Diagnosis not present

## 2018-02-24 DIAGNOSIS — J449 Chronic obstructive pulmonary disease, unspecified: Secondary | ICD-10-CM | POA: Diagnosis not present

## 2018-02-24 DIAGNOSIS — Z8719 Personal history of other diseases of the digestive system: Secondary | ICD-10-CM

## 2018-02-24 DIAGNOSIS — I252 Old myocardial infarction: Secondary | ICD-10-CM | POA: Diagnosis not present

## 2018-02-24 DIAGNOSIS — I2510 Atherosclerotic heart disease of native coronary artery without angina pectoris: Secondary | ICD-10-CM | POA: Diagnosis not present

## 2018-02-24 HISTORY — DX: Personal history of other diseases of the digestive system: Z87.19

## 2018-02-24 HISTORY — DX: Esophageal obstruction: K22.2

## 2018-02-24 HISTORY — PX: OTHER SURGICAL HISTORY: SHX169

## 2018-02-24 MED ORDER — SODIUM CHLORIDE 0.9 % IV SOLN: 500.0000 mL | Freq: Once | INTRAVENOUS | Status: DC

## 2018-02-24 NOTE — Patient Instructions (Signed)
HANDOUTS GIVEN FOR HIATAL HERNIA AND STRICTURE  CHEW YOUR FOOD WELL UNTIL A PASTE CONSISTENCY AND DRINK FLUIDS TO [place] YOUR FOOD DOWN   YOU HAD AN ENDOSCOPIC PROCEDURE TODAY AT [facility]:   Refer to the procedure report that was given to you for any specific questions about what was found during the examination.  If the procedure report does not answer your questions, please call your gastroenterologist to clarify.  If you requested that your care partner not be given the details of your procedure findings, then the procedure report has been included in a sealed envelope for you to review at your convenience later.  YOU SHOULD EXPECT: Some feelings of bloating in the abdomen. Passage of more gas than usual.  Walking can help get rid of the air that was put into your GI tract during the procedure and reduce the bloating. If you had a lower endoscopy (such as a colonoscopy or flexible sigmoidoscopy) you may notice spotting of blood in your stool or on the toilet paper. If you underwent a bowel prep for your procedure, you may not have a normal bowel movement for a few days.  Please Note:  You might notice some irritation and congestion in your nose or some drainage.  This is from the oxygen used during your procedure.  There is no need for concern and it should clear up in a day or so.  SYMPTOMS TO REPORT IMMEDIATELY:   Following upper endoscopy (EGD)  Vomiting of blood or coffee ground material  New chest pain or pain under the shoulder blades  Painful or persistently difficult swallowing  New shortness of breath  Fever of 100F or higher  Black, tarry-looking stools  For urgent or emergent issues, a gastroenterologist can be reached at any hour by calling [phone].   DIET:  We do recommend a small meal at first, but then you may proceed to your regular diet.  Drink plenty of fluids but you should avoid alcoholic beverages for 24 hours.  ACTIVITY:  You should plan to  take it easy for the rest of today and you should NOT DRIVE or use heavy machinery until tomorrow (because of the sedation medicines used during the test).    FOLLOW UP: Our staff will call the number listed on your records the next business day following your procedure to check on you and address any questions or concerns that you may have regarding the information given to you following your procedure. If we do not reach you, we will leave a message.  However, if you are feeling well and you are not experiencing any problems, there is no need to return our call.  We will assume that you have returned to your regular daily activities without incident.  If any biopsies were taken you will be contacted by phone or by letter within the next 1-3 weeks.  Please call us at [phone] if you have not heard about the biopsies in 3 weeks.    SIGNATURES/CONFIDENTIALITY: You and/or your care partner have signed paperwork which will be entered into your electronic medical record.  These signatures attest to the fact that that the information above on your After Visit Summary has been reviewed and is understood.  Full responsibility of the confidentiality of this discharge information lies with you and/or your care-partner.

## 2018-02-24 NOTE — Progress Notes (Signed)
Report given to PACU, vss 

## 2018-02-24 NOTE — Op Note (Signed)
[facility] Patient Name: Leslie Cardenas Procedure Date: 02/24/2018 11:01 AM MRN: [id number] Endoscopist: Mallie Mussel L. Loletha Carrow , MD Age: 77 Referring MD:  Date of Birth: September 25, 1940 Gender: Male Account #: [id number] Procedure:                Upper GI endoscopy Indications:              Dysphagia, Weight loss (> 10 pounds in 6 months) Medicines:                Monitored Anesthesia Care Procedure:                Pre-Anesthesia Assessment:                           - Prior to the procedure, a History and Physical                            was performed, and patient medications and                            allergies were reviewed. The patient's tolerance of                            previous anesthesia was also reviewed. The risks                            and benefits of the procedure and the sedation                            options and risks were discussed with the patient.                            All questions were answered, and informed consent                            was obtained. Prior Anticoagulants: The patient has                            taken no previous anticoagulant or antiplatelet                            agents. ASA Grade Assessment: III - A patient with                            severe systemic disease. After reviewing the risks                            and benefits, the patient was deemed in                            satisfactory condition to undergo the procedure.                           After obtaining informed consent, the endoscope was  passed under direct vision. Throughout the                            procedure, the patient's blood pressure, pulse, and                            oxygen saturations were monitored continuously. The                            Model GIF-[id number]) scope was introduced                            through the mouth, and advanced to the second part                            of  duodenum. The upper GI endoscopy was                            accomplished without difficulty. The patient                            tolerated the procedure well. Scope In: Scope Out: Findings:                 The larynx was normal.                           A small hiatal hernia was present. The distal                            esophagus was mildly tortuous.                           One benign-appearing, intrinsic moderate stenosis                            with shallow circumferential ulceration was found                            at the gastroesophageal junction. This stenosis                            measured 1.1 cm (inner diameter) x less than one cm                            (in length). The stenosis was traversed. A TTS                            dilator was passed through the scope. Dilation with                            a 16-17-18 mm balloon dilator was performed to 18                            mm. The dilation  site was examined and showed                            moderate mucosal disruption and moderate                            improvement in luminal narrowing.                           Diffuse atrophic mucosa was found in the entire                            examined stomach. Several biopsies were obtained in                            the gastric body and in the gastric antrum with                            cold forceps for histology.                           Diffuse nodular mucosa was found in the gastric                            fundus and in the gastric body. Biopsies were taken                            of the gastric fundus with a cold forceps for                            histology.                           The exam of the stomach was otherwise normal                            (including on retroflexion), and distended well                            with air.                           The examined duodenum was normal. Complications:             No immediate complications. Estimated Blood Loss:     Estimated blood loss was minimal. Impression:               - Normal larynx.                           - Small hiatal hernia. Distal esophageal tortuosity.                           - Benign-appearing esophageal stenosis. Dilated.                           -  Gastric mucosal atrophy.                           - Nodular mucosa in the gastric fundus and in the                            gastric body. Biopsied.                           - Normal examined duodenum.                           - Several biopsies were obtained in the gastric                            body and in the gastric antrum. Recommendation:           - Patient has a contact number available for                            emergencies. The signs and symptoms of potential                            delayed complications were discussed with the                            patient. Return to normal activities tomorrow.                            Written discharge instructions were provided to the                            patient.                           - Resume previous diet.                           - Continue present medications, including daily                            omeprazole.                           - Await pathology results.                           - Return to GI clinic to see Dr. Carlean Purl as needed.                           - If dysphagia improves but weight loss continues,                            alternate sources of weight loss should be                            investigated.  Dawayne L. Loletha Carrow, MD 02/24/2018 11:36:51 AM This report has been signed electronically.

## 2018-02-24 NOTE — Progress Notes (Signed)
Called to room to assist during endoscopic procedure.  Patient ID and intended procedure confirmed with present staff. Received instructions for my participation in the procedure from the performing physician.  

## 2018-02-25 ENCOUNTER — Telehealth: Payer: Self-pay | Admitting: *Deleted

## 2018-02-25 NOTE — Telephone Encounter (Signed)
  Follow up Call-  Call back number 02/24/2018 07/16/2016  Post procedure Call Back phone  # [phone]  Permission to leave phone message Yes Yes  Some recent data might be hidden     Patient questions:  Do you have a fever, pain , or abdominal swelling? No. Pain Score  0 *  Have you tolerated food without any problems? Yes.    Have you been able to return to your normal activities? Yes.    Do you have any questions about your discharge instructions: Diet   No. Medications  No. Follow up visit  No.  Do you have questions or concerns about your Care? No.  Actions: * If pain score is 4 or above: No action needed, pain <4.

## 2018-03-04 ENCOUNTER — Observation Stay (HOSPITAL_COMMUNITY): Payer: Medicare HMO | Admitting: Anesthesiology

## 2018-03-04 ENCOUNTER — Observation Stay (HOSPITAL_COMMUNITY)
Admission: EM | Admit: 2018-03-04 | Discharge: 2018-03-05 | Disposition: A | Discharge status: Home / Self Care | Payer: Medicare HMO | Attending: Internal Medicine | Admitting: Internal Medicine

## 2018-03-04 ENCOUNTER — Observation Stay (HOSPITAL_COMMUNITY): Payer: Medicare HMO

## 2018-03-04 ENCOUNTER — Telehealth: Payer: Self-pay | Admitting: Family Medicine

## 2018-03-04 ENCOUNTER — Encounter (HOSPITAL_COMMUNITY)
Admission: EM | Disposition: A | Discharge status: Home / Self Care | Payer: Self-pay | Source: Home / Self Care | Attending: Emergency Medicine

## 2018-03-04 ENCOUNTER — Ambulatory Visit (HOSPITAL_COMMUNITY)
Admission: EM | Admit: 2018-03-04 | Discharge: 2018-03-04 | Disposition: A | Discharge status: Home / Self Care | Payer: Medicare HMO | Source: Home / Self Care

## 2018-03-04 ENCOUNTER — Emergency Department (HOSPITAL_COMMUNITY): Payer: Medicare HMO

## 2018-03-04 ENCOUNTER — Other Ambulatory Visit: Payer: Self-pay

## 2018-03-04 ENCOUNTER — Encounter (HOSPITAL_COMMUNITY): Payer: Self-pay | Admitting: *Deleted

## 2018-03-04 DIAGNOSIS — N1330 Unspecified hydronephrosis: Secondary | ICD-10-CM

## 2018-03-04 DIAGNOSIS — I252 Old myocardial infarction: Secondary | ICD-10-CM | POA: Insufficient documentation

## 2018-03-04 DIAGNOSIS — N200 Calculus of kidney: Secondary | ICD-10-CM | POA: Diagnosis not present

## 2018-03-04 DIAGNOSIS — E559 Vitamin D deficiency, unspecified: Secondary | ICD-10-CM | POA: Insufficient documentation

## 2018-03-04 DIAGNOSIS — Z89511 Acquired absence of right leg below knee: Secondary | ICD-10-CM | POA: Diagnosis not present

## 2018-03-04 DIAGNOSIS — N132 Hydronephrosis with renal and ureteral calculous obstruction: Secondary | ICD-10-CM | POA: Diagnosis not present

## 2018-03-04 DIAGNOSIS — I700 Atherosclerosis of aorta: Secondary | ICD-10-CM | POA: Diagnosis not present

## 2018-03-04 DIAGNOSIS — N201 Calculus of ureter: Secondary | ICD-10-CM

## 2018-03-04 DIAGNOSIS — Z87891 Personal history of nicotine dependence: Secondary | ICD-10-CM | POA: Insufficient documentation

## 2018-03-04 DIAGNOSIS — N179 Acute kidney failure, unspecified: Secondary | ICD-10-CM | POA: Diagnosis not present

## 2018-03-04 DIAGNOSIS — Z419 Encounter for procedure for purposes other than remedying health state, unspecified: Secondary | ICD-10-CM

## 2018-03-04 DIAGNOSIS — K219 Gastro-esophageal reflux disease without esophagitis: Secondary | ICD-10-CM | POA: Diagnosis not present

## 2018-03-04 DIAGNOSIS — R7303 Prediabetes: Secondary | ICD-10-CM

## 2018-03-04 DIAGNOSIS — J449 Chronic obstructive pulmonary disease, unspecified: Secondary | ICD-10-CM | POA: Diagnosis not present

## 2018-03-04 DIAGNOSIS — D72829 Elevated white blood cell count, unspecified: Secondary | ICD-10-CM | POA: Diagnosis not present

## 2018-03-04 DIAGNOSIS — Z79899 Other long term (current) drug therapy: Secondary | ICD-10-CM | POA: Insufficient documentation

## 2018-03-04 DIAGNOSIS — I10 Essential (primary) hypertension: Secondary | ICD-10-CM | POA: Diagnosis present

## 2018-03-04 DIAGNOSIS — R1031 Right lower quadrant pain: Secondary | ICD-10-CM | POA: Diagnosis not present

## 2018-03-04 DIAGNOSIS — E785 Hyperlipidemia, unspecified: Secondary | ICD-10-CM | POA: Insufficient documentation

## 2018-03-04 DIAGNOSIS — Z466 Encounter for fitting and adjustment of urinary device: Secondary | ICD-10-CM | POA: Diagnosis not present

## 2018-03-04 DIAGNOSIS — N136 Pyonephrosis: Principal | ICD-10-CM | POA: Insufficient documentation

## 2018-03-04 DIAGNOSIS — I2510 Atherosclerotic heart disease of native coronary artery without angina pectoris: Secondary | ICD-10-CM | POA: Diagnosis not present

## 2018-03-04 HISTORY — DX: Unspecified hydronephrosis: N13.30

## 2018-03-04 HISTORY — DX: Calculus of kidney: N20.0

## 2018-03-04 HISTORY — DX: Atherosclerosis of aorta: I70.0

## 2018-03-04 HISTORY — DX: Calculus of ureter: N20.1

## 2018-03-04 HISTORY — PX: CYSTOSCOPY W/ URETERAL STENT PLACEMENT: SHX1429

## 2018-03-04 LAB — COMPREHENSIVE METABOLIC PANEL
ALBUMIN: 3.6 g/dL (ref 3.5–5.0)
ALT: 25 U/L (ref 0–44)
AST: 24 U/L (ref 15–41)
Alkaline Phosphatase: 79 U/L (ref 38–126)
Anion gap: 10 (ref 5–15)
BUN: 28 mg/dL — AB (ref 8–23)
CO2: 26 mmol/L (ref 22–32)
Calcium: 9.1 mg/dL (ref 8.9–10.3)
Chloride: 99 mmol/L (ref 98–111)
Creatinine, Ser: 1.68 mg/dL — ABNORMAL HIGH (ref 0.61–1.24)
GFR calc Af Amer: 43 mL/min — ABNORMAL LOW (ref >60)
GFR calc non Af Amer: 37 mL/min — ABNORMAL LOW (ref >60)
GLUCOSE: 113 mg/dL — AB (ref 70–99)
Potassium: 4.2 mmol/L (ref 3.5–5.1)
SODIUM: 135 mmol/L (ref 135–145)
Total Bilirubin: 0.9 mg/dL (ref 0.3–1.2)
Total Protein: 7.1 g/dL (ref 6.5–8.1)

## 2018-03-04 LAB — CBC
HCT: 48.7 % (ref 39.0–52.0)
Hemoglobin: 16.1 g/dL (ref 13.0–17.0)
MCH: 32.8 pg (ref 26.0–34.0)
MCHC: 33.1 g/dL (ref 30.0–36.0)
MCV: 99.2 fL (ref 78.0–100.0)
PLATELETS: 351 10*3/uL (ref 150–400)
RBC: 4.91 MIL/uL (ref 4.22–5.81)
RDW: 13.7 % (ref 11.5–15.5)
WBC: 26.8 10*3/uL — ABNORMAL HIGH (ref 4.0–10.5)

## 2018-03-04 LAB — DIFFERENTIAL
ABS IMMATURE GRANULOCYTES: 0.4 10*3/uL — AB (ref 0.0–0.1)
BASOS ABS: 0.1 10*3/uL (ref 0.0–0.1)
BASOS PCT: 0 %
Eosinophils Absolute: 0 10*3/uL (ref 0.0–0.7)
Eosinophils Relative: 0 %
IMMATURE GRANULOCYTES: 2 %
LYMPHS PCT: 3 %
Lymphs Abs: 0.9 10*3/uL (ref 0.7–4.0)
Monocytes Absolute: 2.3 10*3/uL — ABNORMAL HIGH (ref 0.1–1.0)
Monocytes Relative: 9 %
NEUTROS ABS: 22.8 10*3/uL — AB (ref 1.7–7.7)
Neutrophils Relative %: 86 %

## 2018-03-04 LAB — URINALYSIS, ROUTINE W REFLEX MICROSCOPIC
Bilirubin Urine: NEGATIVE
Glucose, UA: NEGATIVE mg/dL
KETONES UR: NEGATIVE mg/dL
Nitrite: NEGATIVE
Protein, ur: 30 mg/dL — AB
Specific Gravity, Urine: 1.024 (ref 1.005–1.030)
pH: 6 (ref 5.0–8.0)

## 2018-03-04 LAB — LIPASE, BLOOD: Lipase: 26 U/L (ref 11–51)

## 2018-03-04 LAB — SURGICAL PCR SCREEN
MRSA, PCR: NEGATIVE
Staphylococcus aureus: NEGATIVE

## 2018-03-04 SURGERY — CYSTOSCOPY, WITH RETROGRADE PYELOGRAM AND URETERAL STENT INSERTION
Anesthesia: General | Site: Urethra | Laterality: Right

## 2018-03-04 MED ORDER — LEVOFLOXACIN IN D5W 250 MG/50ML IV SOLN
250.0000 mg | INTRAVENOUS | Status: DC
  Administered 2018-03-04: 250 mg via INTRAVENOUS
  Filled 2018-03-04: qty 50

## 2018-03-04 MED ORDER — ACETAMINOPHEN 325 MG PO TABS: 650.0000 mg | ORAL_TABLET | Freq: Four times a day (QID) | ORAL | Status: DC | PRN

## 2018-03-04 MED ORDER — SODIUM CHLORIDE 0.9 % IV BOLUS
500.0000 mL | Freq: Once | INTRAVENOUS | Status: AC
  Administered 2018-03-04: 500 mL via INTRAVENOUS

## 2018-03-04 MED ORDER — ACETAMINOPHEN 650 MG RE SUPP: 650.0000 mg | Freq: Four times a day (QID) | RECTAL | Status: DC | PRN

## 2018-03-04 MED ORDER — DEXAMETHASONE SODIUM PHOSPHATE 4 MG/ML IJ SOLN
INTRAMUSCULAR | Status: DC | PRN
  Administered 2018-03-04: 4 mg via INTRAVENOUS

## 2018-03-04 MED ORDER — FENTANYL CITRATE (PF) 100 MCG/2ML IJ SOLN: 25.0000 ug | INTRAMUSCULAR | Status: DC | PRN

## 2018-03-04 MED ORDER — IOPAMIDOL (ISOVUE-300) INJECTION 61%
INTRAVENOUS | Status: AC
  Filled 2018-03-04: qty 50

## 2018-03-04 MED ORDER — PROPOFOL 10 MG/ML IV BOLUS
INTRAVENOUS | Status: DC | PRN
  Administered 2018-03-04: 50 mg via INTRAVENOUS
  Administered 2018-03-04: 150 mg via INTRAVENOUS

## 2018-03-04 MED ORDER — SODIUM CHLORIDE 0.9 % IV SOLN: 1.0000 g | INTRAVENOUS | Status: DC

## 2018-03-04 MED ORDER — PANTOPRAZOLE SODIUM 40 MG PO TBEC
40.0000 mg | DELAYED_RELEASE_TABLET | Freq: Every day | ORAL | Status: DC
  Administered 2018-03-04 – 2018-03-05 (×2): 40 mg via ORAL
  Filled 2018-03-04 (×2): qty 1

## 2018-03-04 MED ORDER — IOHEXOL 300 MG/ML  SOLN
80.0000 mL | Freq: Once | INTRAMUSCULAR | Status: AC | PRN
  Administered 2018-03-04: 80 mL via INTRAVENOUS

## 2018-03-04 MED ORDER — HEPARIN SODIUM (PORCINE) 5000 UNIT/ML IJ SOLN: 5000.0000 [IU] | Freq: Three times a day (TID) | INTRAMUSCULAR | Status: DC

## 2018-03-04 MED ORDER — ALBUTEROL SULFATE (2.5 MG/3ML) 0.083% IN NEBU: 2.5000 mg | INHALATION_SOLUTION | RESPIRATORY_TRACT | Status: DC | PRN

## 2018-03-04 MED ORDER — CEPHALEXIN 500 MG PO CAPS: 500.0000 mg | ORAL_CAPSULE | Freq: Three times a day (TID) | ORAL | Status: DC

## 2018-03-04 MED ORDER — MORPHINE SULFATE (PF) 2 MG/ML IV SOLN
2.0000 mg | INTRAVENOUS | Status: DC | PRN
  Administered 2018-03-04: 2 mg via INTRAVENOUS
  Filled 2018-03-04: qty 1

## 2018-03-04 MED ORDER — PROPOFOL 10 MG/ML IV BOLUS
INTRAVENOUS | Status: AC
  Filled 2018-03-04: qty 20

## 2018-03-04 MED ORDER — OXYCODONE HCL 5 MG PO TABS
5.0000 mg | ORAL_TABLET | ORAL | Status: DC | PRN
  Administered 2018-03-04: 5 mg via ORAL
  Filled 2018-03-04: qty 1

## 2018-03-04 MED ORDER — LIDOCAINE 2% (20 MG/ML) 5 ML SYRINGE
INTRAMUSCULAR | Status: DC | PRN
  Administered 2018-03-04: 60 mg via INTRAVENOUS

## 2018-03-04 MED ORDER — SODIUM CHLORIDE 0.9 % IV SOLN
INTRAVENOUS | Status: AC
  Administered 2018-03-04: 18:00:00 via INTRAVENOUS

## 2018-03-04 MED ORDER — ONDANSETRON HCL 4 MG/2ML IJ SOLN
4.0000 mg | Freq: Once | INTRAMUSCULAR | Status: AC
  Administered 2018-03-04: 4 mg via INTRAVENOUS
  Filled 2018-03-04: qty 2

## 2018-03-04 MED ORDER — ONDANSETRON HCL 4 MG/2ML IJ SOLN
INTRAMUSCULAR | Status: AC
  Filled 2018-03-04: qty 2

## 2018-03-04 MED ORDER — IOPAMIDOL (ISOVUE-300) INJECTION 61%
INTRAVENOUS | Status: DC | PRN
  Administered 2018-03-04: 10 mL via URETHRAL

## 2018-03-04 MED ORDER — NITROGLYCERIN 0.4 MG SL SUBL: 0.4000 mg | SUBLINGUAL_TABLET | SUBLINGUAL | Status: DC | PRN

## 2018-03-04 MED ORDER — PHENYLEPHRINE HCL 10 MG/ML IJ SOLN
INTRAMUSCULAR | Status: DC | PRN
  Administered 2018-03-04: 80 ug via INTRAVENOUS
  Administered 2018-03-04: 100 ug via INTRAVENOUS

## 2018-03-04 MED ORDER — ONDANSETRON HCL 4 MG/2ML IJ SOLN: 4.0000 mg | Freq: Once | INTRAMUSCULAR | Status: DC | PRN

## 2018-03-04 MED ORDER — DEXAMETHASONE SODIUM PHOSPHATE 10 MG/ML IJ SOLN
INTRAMUSCULAR | Status: AC
  Filled 2018-03-04: qty 1

## 2018-03-04 MED ORDER — MORPHINE SULFATE (PF) 4 MG/ML IV SOLN
4.0000 mg | Freq: Once | INTRAVENOUS | Status: AC
  Administered 2018-03-04: 4 mg via INTRAVENOUS
  Filled 2018-03-04: qty 1

## 2018-03-04 MED ORDER — STERILE WATER FOR IRRIGATION IR SOLN
Status: DC | PRN
  Administered 2018-03-04: 1000 mL

## 2018-03-04 MED ORDER — ONDANSETRON HCL 4 MG/2ML IJ SOLN
4.0000 mg | Freq: Four times a day (QID) | INTRAMUSCULAR | Status: DC | PRN
  Administered 2018-03-04: 4 mg via INTRAVENOUS

## 2018-03-04 MED ORDER — LACTATED RINGERS IV SOLN
INTRAVENOUS | Status: DC
  Administered 2018-03-04: 20:00:00 via INTRAVENOUS

## 2018-03-04 MED ORDER — SODIUM CHLORIDE 0.9% FLUSH: 3.0000 mL | Freq: Two times a day (BID) | INTRAVENOUS | Status: DC

## 2018-03-04 MED ORDER — FENTANYL CITRATE (PF) 250 MCG/5ML IJ SOLN
INTRAMUSCULAR | Status: AC
  Filled 2018-03-04: qty 5

## 2018-03-04 SURGICAL SUPPLY — 30 items
ADAPTER CATH URET PLST 4-6FR (CATHETERS) IMPLANT
BAG URINE DRAINAGE (UROLOGICAL SUPPLIES) ×2 IMPLANT
BAG URO CATCHER STRL LF (MISCELLANEOUS) ×2 IMPLANT
BENZOIN TINCTURE PRP APPL 2/3 (GAUZE/BANDAGES/DRESSINGS) IMPLANT
BLADE 10 SAFETY STRL DISP (BLADE) ×2 IMPLANT
BUCKET BIOHAZARD WASTE 5 GAL (MISCELLANEOUS) ×2 IMPLANT
CATH URET 5FR 28IN CONE TIP (BALLOONS)
CATH URET 5FR 70CM CONE TIP (BALLOONS) IMPLANT
DRAPE CAMERA CLOSED 9X96 (DRAPES) ×4 IMPLANT
GLOVE BIOGEL M 6.5 STRL (GLOVE) ×1 IMPLANT
GLOVE BIOGEL PI IND STRL 7.5 (GLOVE) IMPLANT
GLOVE BIOGEL PI INDICATOR 7.5 (GLOVE) ×1
GOWN STRL REUS W/ TWL LRG LVL3 (GOWN DISPOSABLE) ×1 IMPLANT
GOWN STRL REUS W/ TWL XL LVL3 (GOWN DISPOSABLE) ×1 IMPLANT
GOWN STRL REUS W/TWL LRG LVL3 (GOWN DISPOSABLE) ×1
GOWN STRL REUS W/TWL XL LVL3 (GOWN DISPOSABLE) ×1
GUIDEWIRE COOK  .035 (WIRE) IMPLANT
KIT TURNOVER KIT B (KITS) ×2 IMPLANT
NS IRRIG 1000ML POUR BTL (IV SOLUTION) ×4 IMPLANT
PACK CYSTO (CUSTOM PROCEDURE TRAY) ×2 IMPLANT
PAD ARMBOARD 7.5X6 YLW CONV (MISCELLANEOUS) ×4 IMPLANT
PLUG CATH AND CAP STER (CATHETERS) IMPLANT
STENT URET 6FRX26 CONTOUR (STENTS) ×1 IMPLANT
SYRINGE CONTROL L 12CC (SYRINGE) ×2 IMPLANT
SYRINGE CONTROL LL 12CC (SYRINGE) ×1 IMPLANT
SYRINGE TOOMEY DISP (SYRINGE) IMPLANT
TRAY FOLEY BAG SILVER LF 16FR (CATHETERS) ×1 IMPLANT
UNDERPAD 30X30 (UNDERPADS AND DIAPERS) ×2 IMPLANT
WATER STERILE IRR 1000ML POUR (IV SOLUTION) ×2 IMPLANT
WIRE COONS/BENSON .038X145CM (WIRE) ×1 IMPLANT

## 2018-03-04 NOTE — ED Provider Notes (Signed)
[facility] EMERGENCY DEPARTMENT Provider Note   CSN: [id number] Arrival date & time: 03/04/18  1761     History   Chief Complaint Chief Complaint  Patient presents with  . Abdominal Pain    HPI Leslie Cardenas is a 77 y.o. male.  HPI Patient presents to the emergency department with right-sided abdominal discomfort that started at 1 AM Wednesday morning.  The patient states he started with vomiting as well.  Patient states that nothing seems make the condition better but certain movements and palpation make the pain worse.  Patient states that he called his doctor who advised him to come to the emergency department for further evaluation.  The patient did not take any medications prior to arrival for his symptoms.  Patient states that he is never had any abdominal surgeries in the past.  The patient denies chest pain, shortness of breath, headache,blurred vision, neck pain, fever, cough, weakness, numbness, dizziness, anorexia, edema,  diarrhea, rash, back pain, dysuria, hematemesis, bloody stool, near syncope, or syncope. Past Medical History:  Diagnosis Date  . Colon polyp   . COPD (chronic obstructive pulmonary disease) ([place])   . Coronary artery disease   . Diverticulosis   . Esophageal stricture   . GERD (gastroesophageal reflux disease)   . Hyperlipidemia   . Myocardial infarction ([place])    [id number]  . 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 Vitamin D deficiency     Patient Active Problem List   Diagnosis Date Noted  . 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 S/P unilateral BKA (below knee amputation), right ([place]) 09/22/2017  . GERD 02/15/2015  . Medicare annual wellness visit, initial 02/15/2015  . 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 Hx of right BKA ([place]) 01/15/2014  . Essential hypertension 07/05/2013  . Prediabetes 07/05/2013  . Vitamin D deficiency 07/05/2013  . Medication management 07/05/2013  . History of colonic polyps  03/30/2012  . Hyperlipidemia 11/13/2011  . COPD (chronic obstructive pulmonary disease) ([place]) 11/13/2011  . Sinus bradycardia 11/13/2011    Past Surgical History:  Procedure Laterality Date  . CARDIAC CATHETERIZATION  1983   No intervention performed.   Marland Kitchen CATARACT EXTRACTION, BILATERAL    . COLONOSCOPY    . COLONOSCOPY W/ POLYPECTOMY  08/12/2010   Dr. Silvano Rusk  . ESOPHAGOGASTRODUODENOSCOPY    . INGUINAL HERNIA REPAIR     left  . right leg amputation    . SHOULDER ARTHROSCOPY Left 2000  . UPPER GASTROINTESTINAL ENDOSCOPY          Home Medications    Prior to Admission medications   Medication Sig Start Date End Date Taking? Authorizing Provider  albuterol (PROVENTIL HFA;VENTOLIN HFA) 108 (90 Base) MCG/ACT inhaler Inhale 2 puffs into the lungs every 6 (six) hours as needed for wheezing or shortness of breath. 09/04/15  Yes Dixon, Mary B, PA-C  albuterol (PROVENTIL) (2.5 MG/3ML) 0.083% nebulizer solution Take 3 mLs (2.5 mg total) by nebulization every 4 (four) hours as needed for wheezing or shortness of breath. 02/17/18  Yes Delsa Grana, PA-C  nitroGLYCERIN (NITROSTAT) 0.4 MG SL tablet Place 1 tablet (0.4 mg total) under the tongue every 5 (five) minutes x 3 doses as needed for chest pain. 11/14/11  Yes Dunn, Nedra Hai, PA-C  omeprazole (PRILOSEC) 40 MG capsule Take 1 capsule (40 mg total) by mouth daily before breakfast. 02/23/18  Yes Levin Erp, PA  atorvastatin (LIPITOR) 20 MG tablet Take 1 tablet (20 mg  total) by mouth daily. Patient not taking: Reported on 02/24/2018 01/14/17   [name], MD  losartan (COZAAR) 50 MG tablet Take 1 tablet (50 mg total) by mouth daily. Patient not taking: Reported on 02/24/2018 01/14/17   [name], MD  Respiratory Therapy Supplies (NEBULIZER/TUBING/MOUTHPIECE) KIT Disp one nebulizer machine, tubing set and mouthpiece kit 02/17/18   Delsa Grana, PA-C  triamcinolone cream (KENALOG) 0.1 % Apply 1 application topically 2 (two)  times daily. Patient not taking: Reported on 03/04/2018 08/06/17   [name], MD    Family History Family History  Problem Relation Age of Onset  . Hypertension Father   . Pancreatic cancer Mother   . Cancer Mother   . Colon cancer Brother   . Leukemia Brother   . Prostate cancer Brother   . Other Brother        polio  . Esophageal cancer Neg Hx   . Rectal cancer Neg Hx   . Stomach cancer Neg Hx     Social History Social History   Tobacco Use  . Smoking status: Former Smoker    Packs/day: 3.00    Years: 20.00    Pack years: 60.00    Types: Cigarettes    Last attempt to quit: 04/11/2000    Years since quitting: 17.9  . Smokeless tobacco: Never Used  Substance Use Topics  . Alcohol use: Not Currently  . Drug use: No     Allergies   Patient has no known allergies.   Review of Systems Review of Systems  All other systems negative except as documented in the HPI. All pertinent positives and negatives as reviewed in the HPI. Physical Exam Updated Vital Signs BP (!) 157/95   Pulse 67   Temp (!) 97.5 F (36.4 C) (Oral)   Resp 16   SpO2 100%   Physical Exam  Constitutional: He is oriented to person, place, and time. He appears well-developed and well-nourished. No distress.  HENT:  Head: Normocephalic and atraumatic.  Mouth/Throat: Oropharynx is clear and moist.  Eyes: Pupils are equal, round, and reactive to light.  Neck: Normal range of motion. Neck supple.  Cardiovascular: Normal rate, regular rhythm and normal heart sounds. Exam reveals no gallop and no friction rub.  No murmur heard. Pulmonary/Chest: Effort normal and breath sounds normal. No respiratory distress. He has no wheezes.  Abdominal: Soft. Bowel sounds are normal. He exhibits no distension. There is tenderness in the right lower quadrant. There is guarding. There is no rebound, no tenderness at McBurney's point and negative Murphy's sign. No hernia.  Neurological: He is alert and oriented  to person, place, and time. He exhibits normal muscle tone. Coordination normal.  Skin: Skin is warm and dry. Capillary refill takes less than 2 seconds. No rash noted. No erythema.  Psychiatric: He has a normal mood and affect. His behavior is normal.  Nursing note and vitals reviewed.    ED Treatments / Results  Labs (all labs ordered are listed, but only abnormal results are displayed) Labs Reviewed  COMPREHENSIVE METABOLIC PANEL - Abnormal; Notable for the following components:      Result Value   Glucose, Bld 113 (*)    BUN 28 (*)    Creatinine, Ser 1.68 (*)    GFR calc non Af Amer 37 (*)    GFR calc Af Amer 43 (*)    All other components within normal limits  CBC - Abnormal; Notable for the following components:   WBC  26.8 (*)    All other components within normal limits  URINALYSIS, ROUTINE W REFLEX MICROSCOPIC - Abnormal; Notable for the following components:   Hgb urine dipstick MODERATE (*)    Protein, ur 30 (*)    Leukocytes, UA TRACE (*)    Bacteria, UA RARE (*)    All other components within normal limits  LIPASE, BLOOD    EKG None  Radiology Ct Abdomen Pelvis W Contrast  Result Date: 03/04/2018 CLINICAL DATA:  77 year old male with a history of right lower quadrant pain EXAM: CT ABDOMEN AND PELVIS WITH CONTRAST TECHNIQUE: Multidetector CT imaging of the abdomen and pelvis was performed using the standard protocol following bolus administration of intravenous contrast. CONTRAST:  9m OMNIPAQUE IOHEXOL 300 MG/ML  SOLN COMPARISON:  None. FINDINGS: Lower chest: Atelectasis/scarring at the bilateral lung bases with no acute finding. Hepatobiliary: Small benign cystic structure within segment 8 at the liver dome, with otherwise unremarkable liver. Unremarkable gallbladder. Pancreas: Unremarkable pancreas Spleen: Unremarkable spleen Adrenals/Urinary Tract: Unremarkable bilateral adrenal glands. Right kidney with moderate to severe hydronephrosis and perinephric stranding.  Nonobstructive stone at the lower pole collecting system measures 4 mm. Dilated right ureter with transition at obstructing 7 mm stone. Left kidney without hydronephrosis or nephrolithiasis. Unremarkable course of the left ureter. Unremarkable urinary bladder. Stomach/Bowel: Hiatal hernia with otherwise unremarkable stomach. Unremarkable small bowel. Normal appendix. Minimal diverticular disease without evidence of acute diverticulitis. Vascular/Lymphatic: Atherosclerotic calcifications of the abdominal aorta and the bilateral iliac arteries. Bilateral iliac arteries and proximal femoral arteries patent. Atherosclerotic changes at the celiac artery and bilateral renal arteries. No lymphadenopathy. Reproductive: Unremarkable pelvic structures. Other: No large hernia. Musculoskeletal: No acute displaced fracture. Multilevel degenerative changes of the thoracolumbar spine. Degenerative changes of the bilateral hips. IMPRESSION: Right hydronephrosis secondary to obstructing ureteral stone measuring 7 mm. If there is concern for ascending urinary tract infection, recommend correlation with urinalysis. Aortic Atherosclerosis (ICD10-I70.0). Additional findings as above. Electronically Signed   By: JCorrie MckusickD.O.   On: 03/04/2018 13:15    Procedures Procedures (including critical care time)  Medications Ordered in ED Medications  sodium chloride 0.9 % bolus 500 mL (0 mLs Intravenous Stopped 03/04/18 1257)  iohexol (OMNIPAQUE) 300 MG/ML solution 80 mL (80 mLs Intravenous Contrast Given 03/04/18 1217)  morphine 4 MG/ML injection 4 mg (4 mg Intravenous Given 03/04/18 1419)  ondansetron (ZOFRAN) injection 4 mg (4 mg Intravenous Given 03/04/18 1417)     Initial Impression / Assessment and Plan / ED Course  I have reviewed the triage vital signs and the nursing notes.  Pertinent labs & imaging results that were available during my care of the patient were reviewed by me and considered in my medical decision  making (see chart for details).     I spoke with Dr. BGloriann Loanof urology who feels the patient will need to be admitted and have a ureteral stent placed.  The patient will be admitted by the [facility] who I spoke with Dr. MDaryll Drown  Patient is understanding of the plan and all questions were answered.  Patient's laboratory testing and CT scan imaging were reviewed by me.  Patient does have an elevated white count along with some renal impairment these are factors that need to be further evaluated but feel that this is due to the amount of vomiting and lack of oral intake.  Along with the pain from the kidney stone there is no definitive UTI at this time.  Final Clinical Impressions(s) / ED Diagnoses  Final diagnoses:  None    ED Discharge Orders    None       Dalia Heading, PA-C 03/04/18 1552    Maudie Flakes, MD 03/04/18 [phone]

## 2018-03-04 NOTE — Transfer of Care (Signed)
Immediate Anesthesia Transfer of Care Note  Patient: Leslie Cardenas  Procedure(s) Performed: CYSTOSCOPY WITH RETROGRADE PYELOGRAM/URETERAL STENT PLACEMENT (Right Urethra)  Patient Location: PACU  Anesthesia Type:General  Level of Consciousness: awake, alert , oriented and patient cooperative  Airway & Oxygen Therapy: Patient Spontanous Breathing  Post-op Assessment: Report given to RN and Post -op Vital signs reviewed and stable  Post vital signs: Reviewed and stable  Last Vitals:  Vitals Value Taken Time  BP    Temp    Pulse    Resp    SpO2      Last Pain:  Vitals:   03/04/18 1826  TempSrc:   PainSc: 5       Patients Stated Pain Goal: 0 (21/11/55 2080)  Complications: No apparent anesthesia complications

## 2018-03-04 NOTE — Progress Notes (Signed)
Pharmacy Antibiotic Note  Leslie Cardenas is a 77 y.o. male admitted on 03/04/2018 with UTI.  Pharmacy has been consulted for levofloxacin dosing.  Plan: Levofloxacin 250mg  IV q24h  Height: 5\' 10"  (177.8 cm) Weight: 122 lb 5.7 oz (55.5 kg) IBW/kg (Calculated) : 73  Temp (24hrs), Avg:97.5 F (36.4 C), Min:97.5 F (36.4 C), Max:97.5 F (36.4 C)  Recent Labs  Lab 03/04/18 0944  WBC 26.8*  CREATININE 1.68*    Estimated Creatinine Clearance: 28.4 mL/min (A) (by C-G formula based on SCr of 1.68 mg/dL (H)).    No Known Allergies   Leslie Cardenas A. Leslie Cardenas, PharmD, [place] Pager: [phone] Please utilize Amion for appropriate phone number to reach the unit pharmacist ([place])  .  03/04/2018 6:15 PM

## 2018-03-04 NOTE — Op Note (Addendum)
Operative Note  Preoperative diagnosis:  1.  Right ureteral calculus  Post operative diagnosis: 1.  Right ureteral calculus  Procedure(s): 1.  Cystoscopy with right retrograde pyelogram and right ureteral stent placement  Surgeon: Link Snuffer, MD  Assistants: None  Anesthesia: General  Complications: None immediate  EBL: Minimal  Specimens: 1.  None  Drains/Catheters: 1.  6 X 26 double-J ureteral stent  Intraoperative findings: 1.  Normal urethra and bladder 2.  Right retrograde pyelogram revealed a filling defect at the level of the stone with upstream hydroureteronephrosis  Indication: 77 year old male presented with right-sided flank pain.  CT scan revealed a 7 mm proximal ureteral calculus with upstream severe hydronephrosis.  White blood cell count was 27.  He is afebrile with vital signs stable but given his leukocytosis and high-grade obstruction, decision was made to proceed with the above operation.  Description of procedure:  The patient was identified and consent was obtained.  The patient was taken to the operating room and placed in the supine position.  The patient was placed under general anesthesia.  Perioperative antibiotics were administered.  The patient was placed in dorsal lithotomy.  Patient was prepped and draped in a standard sterile fashion and a timeout was performed.  A 21 French rigid cystoscope was advanced into the urethra and into the bladder.  The right distal most portion of the ureter was cannulated with an open-ended ureteral catheter.  Retrograde pyelogram was performed with the findings noted above.  A sensor wire was then advanced up to the kidney under fluoroscopic guidance.  A 6 X 26 double-J ureteral stent was advanced up to the kidney under fluoroscopic guidance.  The wire was withdrawn and fluoroscopy confirmed good proximal placement and direct visualization confirmed a good coil within the bladder.  Foley catheter was placed.  This  concluded the operation.  Patient tolerated procedure well and was stable postoperatively.  Plan: Likely discontinue Foley catheter in the morning as long as he does well.  Continue levofloxacin.  Hopeful for discharge tomorrow depending on how he does overnight with a 7-day course of antibiotics unless he becomes febrile.  Plan for outpatient procedure at a future date.

## 2018-03-04 NOTE — H&P (Addendum)
History and Physical    CARRICK RIJOS EPP:[id number] DOB: 1940/12/17 DOA: 03/04/2018  PCP: [name], MD  Patient coming from: Home  Chief Complaint: nausea and vomiting.   HPI: Leslie Cardenas is a 77 y.o. male with medical history significant of HLD, GERD, CAD, COPD who presents for abdominal pain, nausea and vomiting since Tuesday (3 days).  He has no history of abdominal surgery.  Pain is sharp and comes and goes, in the RLQ and groin.  He has had no blood in the urine. CT scan done by the ED showed a 44m stone in the right ureter with hydronephrosis.  He denied any other symptoms except for constipation and chills.  He denied dizziness, lightheaded, fever, SOB, nausea, vomiting, leg pain, leg swelling, urinary symptoms, heartburn.    ED Course: In the ED, the CT scan was done as noted.  He was found to have an elevated WBC and hematuria on UA.  There were very limited indications for a UTI and he did not have any symptoms.  He had an AKI with Cr of 1.68 and BUN of 28.  Urology was consulted and plan for ureteral stenting given hydronephrosis.   Review of Systems: As per HPI otherwise 10 point review of systems negative.   Past Medical History:  Diagnosis Date  . Colon polyp   . COPD (chronic obstructive pulmonary disease) ([place]   . Coronary artery disease   . Diverticulosis   . Esophageal stricture   . GERD (gastroesophageal reflux disease)   . Hyperlipidemia   . [place]ocardial infarction ([place]    [id number] . Traumatic amputation of right leg ([place]   . Vitamin D deficiency     Past Surgical History:  Procedure Laterality Date  . CARDIAC CATHETERIZATION  1983   No intervention performed.   .Marland KitchenCATARACT EXTRACTION, BILATERAL    . COLONOSCOPY    . COLONOSCOPY W/ POLYPECTOMY  08/12/2010   Dr. CSilvano Rusk . ESOPHAGOGASTRODUODENOSCOPY    . INGUINAL HERNIA REPAIR     left  . right leg amputation    . SHOULDER ARTHROSCOPY Left 2000  . UPPER GASTROINTESTINAL ENDOSCOPY      Reviewed with patient.   reports that he quit smoking about 17 years ago. His smoking use included cigarettes. He has a 60.00 pack-year smoking history. He has never used smokeless tobacco. He reports that he drank alcohol. He reports that he does not use drugs.  No Known Allergies  Family History  Problem Relation Age of Onset  . Hypertension Father   . Pancreatic cancer Mother   . Cancer Mother   . Colon cancer Brother   . Leukemia Brother   . Prostate cancer Brother   . Other Brother        polio  . Esophageal cancer Neg Hx   . Rectal cancer Neg Hx   . Stomach cancer Neg Hx      Prior to Admission medications   Medication Sig Start Date End Date Taking? Authorizing Provider  albuterol (PROVENTIL HFA;VENTOLIN HFA) 108 (90 Base) MCG/ACT inhaler Inhale 2 puffs into the lungs every 6 (six) hours as needed for wheezing or shortness of breath. 09/04/15  Yes Dixon, Mary B, PA-C  albuterol (PROVENTIL) (2.5 MG/3ML) 0.083% nebulizer solution Take 3 mLs (2.5 mg total) by nebulization every 4 (four) hours as needed for wheezing or shortness of breath. 02/17/18  Yes TDelsa Grana PA-C  nitroGLYCERIN (NITROSTAT) 0.4 MG SL tablet Place 1 tablet (0.4 mg  total) under the tongue every 5 (five) minutes x 3 doses as needed for chest pain. 11/14/11  Yes Dunn, Nedra Hai, PA-C  omeprazole (PRILOSEC) 40 MG capsule Take 1 capsule (40 mg total) by mouth daily before breakfast. 02/23/18  Yes Lemmon, Lavone Nian, PA  atorvastatin (LIPITOR) 20 MG tablet Take 1 tablet (20 mg total) by mouth daily. Patient not taking: Reported on 02/24/2018 01/14/17   [name], MD  losartan (COZAAR) 50 MG tablet Take 1 tablet (50 mg total) by mouth daily. Patient not taking: Reported on 02/24/2018 01/14/17   [name], MD  Respiratory Therapy Supplies (NEBULIZER/TUBING/MOUTHPIECE) KIT Disp one nebulizer machine, tubing set and mouthpiece kit 02/17/18   Delsa Grana, PA-C  triamcinolone cream (KENALOG) 0.1 % Apply 1  application topically 2 (two) times daily. Patient not taking: Reported on 03/04/2018 08/06/17   [name], MD    Physical Exam: Constitutional: Thin, elderly man, NAD Vitals:   03/04/18 1445 03/04/18 1500 03/04/18 1652 03/04/18 1710  BP: (!) 135/94 (!) 147/74  (!) 155/76  Pulse:  61 (!) 54 (!) 58  Resp:  18  (!) 22  Temp:      TempSrc:      SpO2:  99%  99%  Weight:   55.5 kg   Height:   _0  (1.778 m)    Eyes: PERRL, lids and conjunctivae normal ENMT: Mucous membranes are moist.  Neck: normal, supple Respiratory: Decreased breath sounds throughout, no wheezing, no crackles.  Cardiovascular: RR, NR, No murmur Abdomen: TTP in right lower quadrant, +BS, ND Musculoskeletal: He is s/p amputation of the RLE and wearing a prosthetic limb, no swelling in the left LLE Back: No CVA tenderness bilaterally.  Skin: no rashes, lesions, ulcers. He has chronic skin changes related to sun exposure on the head, neck, face and arms.  Neurologic: Grossly intact, moving easily in bed Psychiatric: Normal judgment and insight. Alert and oriented x 3. Normal mood.    Labs on Admission: I have personally reviewed following labs and imaging studies  CBC: Recent Labs  Lab 03/04/18 0944  WBC 26.8*  HGB 16.1  HCT 48.7  MCV 99.2  PLT 754   Basic Metabolic Panel: Recent Labs  Lab 03/04/18 0944  NA 135  K 4.2  CL 99  CO2 26  GLUCOSE 113*  BUN 28*  CREATININE 1.68*  CALCIUM 9.1   GFR: Estimated Creatinine Clearance: 28.4 mL/min (A) (by C-G formula based on SCr of 1.68 mg/dL (H)). Liver Function Tests: Recent Labs  Lab 03/04/18 0944  AST 24  ALT 25  ALKPHOS 79  BILITOT 0.9  PROT 7.1  ALBUMIN 3.6   Recent Labs  Lab 03/04/18 0944  LIPASE 26   No results for input(s): AMMONIA in the last 168 hours. Coagulation Profile: No results for input(s): INR, PROTIME in the last 168 hours. Cardiac Enzymes: No results for input(s): CKTOTAL, CKMB, CKMBINDEX, TROPONINI in the last  168 hours. BNP (last 3 results) No results for input(s): PROBNP in the last 8760 hours. HbA1C: No results for input(s): HGBA1C in the last 72 hours. CBG: No results for input(s): GLUCAP in the last 168 hours. Lipid Profile: No results for input(s): CHOL, HDL, LDLCALC, TRIG, CHOLHDL, LDLDIRECT in the last 72 hours. Thyroid Function Tests: No results for input(s): TSH, T4TOTAL, FREET4, T3FREE, THYROIDAB in the last 72 hours. Anemia Panel: No results for input(s): VITAMINB12, FOLATE, FERRITIN, TIBC, IRON, RETICCTPCT in the last 72 hours. Urine analysis:  Component Value Date/Time   COLORURINE YELLOW 03/04/2018 0948   APPEARANCEUR CLEAR 03/04/2018 0948   LABSPEC 1.024 03/04/2018 0948   PHURINE 6.0 03/04/2018 0948   GLUCOSEU NEGATIVE 03/04/2018 0948   HGBUR MODERATE (A) 03/04/2018 0948   BILIRUBINUR NEGATIVE 03/04/2018 0948   KETONESUR NEGATIVE 03/04/2018 0948   PROTEINUR 30 (A) 03/04/2018 0948   NITRITE NEGATIVE 03/04/2018 0948   LEUKOCYTESUR TRACE (A) 03/04/2018 0948    Radiological Exams on Admission: Ct Abdomen Pelvis W Contrast  Result Date: 03/04/2018 CLINICAL DATA:  77 year old male with a history of right lower quadrant pain EXAM: CT ABDOMEN AND PELVIS WITH CONTRAST TECHNIQUE: Multidetector CT imaging of the abdomen and pelvis was performed using the standard protocol following bolus administration of intravenous contrast. CONTRAST:  66m OMNIPAQUE IOHEXOL 300 MG/ML  SOLN COMPARISON:  None. FINDINGS: Lower chest: Atelectasis/scarring at the bilateral lung bases with no acute finding. Hepatobiliary: Small benign cystic structure within segment 8 at the liver dome, with otherwise unremarkable liver. Unremarkable gallbladder. Pancreas: Unremarkable pancreas Spleen: Unremarkable spleen Adrenals/Urinary Tract: Unremarkable bilateral adrenal glands. Right kidney with moderate to severe hydronephrosis and perinephric stranding. Nonobstructive stone at the lower pole collecting system  measures 4 mm. Dilated right ureter with transition at obstructing 7 mm stone. Left kidney without hydronephrosis or nephrolithiasis. Unremarkable course of the left ureter. Unremarkable urinary bladder. Stomach/Bowel: Hiatal hernia with otherwise unremarkable stomach. Unremarkable small bowel. Normal appendix. Minimal diverticular disease without evidence of acute diverticulitis. Vascular/Lymphatic: Atherosclerotic calcifications of the abdominal aorta and the bilateral iliac arteries. Bilateral iliac arteries and proximal femoral arteries patent. Atherosclerotic changes at the celiac artery and bilateral renal arteries. No lymphadenopathy. Reproductive: Unremarkable pelvic structures. Other: No large hernia. Musculoskeletal: No acute displaced fracture. Multilevel degenerative changes of the thoracolumbar spine. Degenerative changes of the bilateral hips. IMPRESSION: Right hydronephrosis secondary to obstructing ureteral stone measuring 7 mm. If there is concern for ascending urinary tract infection, recommend correlation with urinalysis. Aortic Atherosclerosis (ICD10-I70.0). Additional findings as above. Electronically Signed   By: JCorrie MckusickD.O.   On: 03/04/2018 13:15      Assessment/Plan Nephrolithiasis with Hydronephrosis on the right - Per report by ED, Dr. BGloriann Loanwith urology is planning for stenting to allow stone to pass and help relieve the hydronephrosis - Monitor for fever - Given hydronephrosis, will treat as UTI with Levaquin IV.  If he should worsen, would broaden coverage to include ESBL organisms and MRSA.   - Trend WBC, add on differential to previous collection - Pain control with morphine - Nausea control with zofran - NPO for possible procedure tonight. If patient not having procedure today, can have dinner  AKI - Cr up to 1.68, with BUN of 28.  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      DVT prophylaxis: Heparin SQ Code Status: Full Family Communication: Ex-Wife (friend) at bedside Disposition Plan: Admit for possible procedure by Urology Consults called: Dr. BGloriann Loanwith Urology by ED Admission status: Obs, med surg   EGilles ChiquitoMD [facility] Pager [phone]  If 7PM-7AM, please contact night-coverage [email or web address] Password [facility]  03/04/2018, 5:33 PM

## 2018-03-04 NOTE — ED Triage Notes (Signed)
Pt in c/o RLQ that started two nights ago, also constipation x2 days, denies fever, reports n/v that started two days ago as well

## 2018-03-04 NOTE — Telephone Encounter (Signed)
Patient called in complaining of severe abdominal pain with an onset of two days ago. Patient informed me that he has tried  treating it with anti-constipation medications with no relief. He denies fever, and sob. Advised patient that we recommend he go to urgent care as well are complete booked but informed also that it is highly imperative that he be seen today. Patient verbalized understanding and stated that he would go to urgent care.

## 2018-03-04 NOTE — Anesthesia Preprocedure Evaluation (Addendum)
Anesthesia Evaluation  Patient identified by MRN, date of birth, ID band Patient awake    Reviewed: Allergy & Precautions, NPO status , Patient's Chart, lab work & pertinent test results  Airway Mallampati: II  TM Distance: >3 FB Neck ROM: Full    Dental  (+) Edentulous Upper, Edentulous Lower   Pulmonary COPD,  COPD inhaler, former smoker,    Pulmonary exam normal breath sounds clear to auscultation       Cardiovascular hypertension, + CAD and + Past MI  Normal cardiovascular exam Rhythm:Regular Rate:Normal  ECHO: Normal LV systolic function; mild diastolic dysfunction.   Neuro/Psych negative neurological ROS  negative psych ROS   GI/Hepatic Neg liver ROS, GERD  Medicated and Controlled,  Endo/Other  negative endocrine ROS  Renal/GU Renal InsufficiencyRenal disease     Musculoskeletal negative musculoskeletal ROS (+)   Abdominal   Peds  Hematology HLD   Anesthesia Other Findings Right ureteral calculus  Reproductive/Obstetrics                            Anesthesia Physical Anesthesia Plan  ASA: III and emergent  Anesthesia Plan: General   Post-op Pain Management:    Induction: Intravenous  PONV Risk Score and Plan: 2 and Ondansetron, Dexamethasone and Treatment may vary due to age or medical condition  Airway Management Planned: LMA  Additional Equipment:   Intra-op Plan:   Post-operative Plan: Extubation in OR  Informed Consent: I have reviewed the patients History and Physical, chart, labs and discussed the procedure including the risks, benefits and alternatives for the proposed anesthesia with the patient or authorized representative who has indicated his/her understanding and acceptance.   Dental advisory given  Plan Discussed with: CRNA  Anesthesia Plan Comments:        Anesthesia Quick Evaluation

## 2018-03-04 NOTE — H&P (Signed)
H&P Physician requesting consult: Emily Mullen, MD  Chief Complaint: Right ureteral calculus  History of Present Illness: 77-year-old male with a right ureteral calculus discovered on CT the scan today after he came in with right-sided flank pain.  He was also found to have a white blood cell count of 27 and a creatinine of 1.7.  Urinalysis was positive for trace leukocytes, negative nitrite, rare bacteria, 21-50 WBCs.  The patient himself feels improved.   Past Medical History:  Diagnosis Date  . Colon polyp   . COPD (chronic obstructive pulmonary disease) ([facility])   . Coronary artery disease   . Diverticulosis   . Esophageal stricture   . GERD (gastroesophageal reflux disease)   . Hyperlipidemia   . 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 Traumatic amputation of right leg ([facility])   . Vitamin D deficiency    Past Surgical History:  Procedure Laterality Date  . CARDIAC CATHETERIZATION  1983   No intervention performed.   . CATARACT EXTRACTION, BILATERAL    . COLONOSCOPY    . COLONOSCOPY W/ POLYPECTOMY  08/12/2010   Dr. Carl Gessner  . ESOPHAGOGASTRODUODENOSCOPY    . INGUINAL HERNIA REPAIR     left  . right leg amputation    . SHOULDER ARTHROSCOPY Left 2000  . UPPER GASTROINTESTINAL ENDOSCOPY      Home Medications:  Facility-Administered Medications Prior to Admission  Medication Dose Route Frequency Provider Last Rate Last Dose  . 0.9 %  sodium chloride infusion  500 mL Intravenous Continuous Gessner, Carl E, MD       Medications Prior to Admission  Medication Sig Dispense Refill Last Dose  . albuterol (PROVENTIL HFA;VENTOLIN HFA) 108 (90 Base) MCG/ACT inhaler Inhale 2 puffs into the lungs every 6 (six) hours as needed for wheezing or shortness of breath. 1 Inhaler 0 as needed  . albuterol (PROVENTIL) (2.5 MG/3ML) 0.083% nebulizer solution Take 3 mLs (2.5 mg total) by nebulization every 4 (four) hours as needed for wheezing or shortness of breath. 150 mL 1 Past Week at Unknown  time  . nitroGLYCERIN (NITROSTAT) 0.4 MG SL tablet Place 1 tablet (0.4 mg total) under the tongue every 5 (five) minutes x 3 doses as needed for chest pain. 25 tablet 4  at prn  . omeprazole (PRILOSEC) 40 MG capsule Take 1 capsule (40 mg total) by mouth daily before breakfast. 90 capsule 3 03/03/2018 at Unknown time  . atorvastatin (LIPITOR) 20 MG tablet Take 1 tablet (20 mg total) by mouth daily. (Patient not taking: Reported on 02/24/2018) 90 tablet 3 Not Taking at Unknown time  . losartan (COZAAR) 50 MG tablet Take 1 tablet (50 mg total) by mouth daily. (Patient not taking: Reported on 02/24/2018) 90 tablet 3 Not Taking at Unknown time  . Respiratory Therapy Supplies (NEBULIZER/TUBING/MOUTHPIECE) KIT Disp one nebulizer machine, tubing set and mouthpiece kit 1 each 0 Taking  . triamcinolone cream (KENALOG) 0.1 % Apply 1 application topically 2 (two) times daily. (Patient not taking: Reported on 03/04/2018) 30 g 0 Not Taking at Unknown time   Allergies: No Known Allergies  Family History  Problem Relation Age of Onset  . Hypertension Father   . Pancreatic cancer Mother   . Cancer Mother   . Colon cancer Brother   . Leukemia Brother   . Prostate cancer Brother   . Other Brother        polio  . Esophageal cancer Neg Hx   . Rectal cancer Neg Hx   .   Stomach cancer Neg Hx    Social History:  reports that he quit smoking about 17 years ago. His smoking use included cigarettes. He has a 60.00 pack-year smoking history. He has never used smokeless tobacco. He reports that he drank alcohol. He reports that he does not use drugs.  ROS: A complete review of systems was performed.  All systems are negative except for pertinent findings as noted. ROS   Physical Exam:  Vital signs in last 24 hours: Temp:  [97.5 F (36.4 C)] 97.5 F (36.4 C) (09/27 0935) Pulse Rate:  [54-86] 58 (09/27 1710) Resp:  [16-22] 22 (09/27 1710) BP: (135-159)/(71-95) 155/76 (09/27 1710) SpO2:  [95 %-100 %] 99 % (09/27  1710) Weight:  [55.5 kg] 55.5 kg (09/27 1652) General:  Alert and oriented, No acute distress HEENT: Normocephalic, atraumatic Neck: No JVD or lymphadenopathy Cardiovascular: Regular rate and rhythm Lungs: Regular rate and effort Abdomen: Soft, nontender, nondistended, no abdominal masses Back: No CVA tenderness Extremities: No edema Neurologic: Grossly intact  Laboratory Data:  Results for orders placed or performed during the hospital encounter of 03/04/18 (from the past 24 hour(s))  Lipase, blood     Status: None   Collection Time: 03/04/18  9:44 AM  Result Value Ref Range   Lipase 26 11 - 51 U/L  Comprehensive metabolic panel     Status: Abnormal   Collection Time: 03/04/18  9:44 AM  Result Value Ref Range   Sodium 135 135 - 145 mmol/L   Potassium 4.2 3.5 - 5.1 mmol/L   Chloride 99 98 - 111 mmol/L   CO2 26 22 - 32 mmol/L   Glucose, Bld 113 (H) 70 - 99 mg/dL   BUN 28 (H) 8 - 23 mg/dL   Creatinine, Ser 1.68 (H) 0.61 - 1.24 mg/dL   Calcium 9.1 8.9 - 10.3 mg/dL   Total Protein 7.1 6.5 - 8.1 g/dL   Albumin 3.6 3.5 - 5.0 g/dL   AST 24 15 - 41 U/L   ALT 25 0 - 44 U/L   Alkaline Phosphatase 79 38 - 126 U/L   Total Bilirubin 0.9 0.3 - 1.2 mg/dL   GFR calc non Af Amer 37 (L) >60 mL/min   GFR calc Af Amer 43 (L) >60 mL/min   Anion gap 10 5 - 15  CBC     Status: Abnormal   Collection Time: 03/04/18  9:44 AM  Result Value Ref Range   WBC 26.8 (H) 4.0 - 10.5 K/uL   RBC 4.91 4.22 - 5.81 MIL/uL   Hemoglobin 16.1 13.0 - 17.0 g/dL   HCT 48.7 39.0 - 52.0 %   MCV 99.2 78.0 - 100.0 fL   MCH 32.8 26.0 - 34.0 pg   MCHC 33.1 30.0 - 36.0 g/dL   RDW 13.7 11.5 - 15.5 %   Platelets 351 150 - 400 K/uL  Urinalysis, Routine w reflex microscopic     Status: Abnormal   Collection Time: 03/04/18  9:48 AM  Result Value Ref Range   Color, Urine YELLOW YELLOW   APPearance CLEAR CLEAR   Specific Gravity, Urine 1.024 1.005 - 1.030   pH 6.0 5.0 - 8.0   Glucose, UA NEGATIVE NEGATIVE mg/dL   Hgb  urine dipstick MODERATE (A) NEGATIVE   Bilirubin Urine NEGATIVE NEGATIVE   Ketones, ur NEGATIVE NEGATIVE mg/dL   Protein, ur 30 (A) NEGATIVE mg/dL   Nitrite NEGATIVE NEGATIVE   Leukocytes, UA TRACE (A) NEGATIVE   RBC / HPF 21-50 0 -  5 RBC/hpf   WBC, UA 21-50 0 - 5 WBC/hpf   Bacteria, UA RARE (A) NONE SEEN   Squamous Epithelial / LPF 0-5 0 - 5   WBC Clumps PRESENT    Mucus PRESENT    No results found for this or any previous visit (from the past 240 hour(s)). Creatinine: Recent Labs    03/04/18 0944  CREATININE 1.68*    Impression/Assessment:  Right ureteral calculus Hydronephrosis secondary to ureteral calculus Possible urinary tract infection  Plan:  Given his elevated white blood cell count of 27 and obstructing right ureteral calculus, recommend urgent right ureteral stent placement.  Discussed this with the patient.  He will need definitive management outpatient.  Marton Redwood, III 03/04/2018, 6:51 PM

## 2018-03-04 NOTE — H&P (View-Only) (Signed)
H&P Physician requesting consult: Emily Mullen, MD  Chief Complaint: Right ureteral calculus  History of Present Illness: 77-year-old male with a right ureteral calculus discovered on CT the scan today after he came in with right-sided flank pain.  He was also found to have a white blood cell count of 27 and a creatinine of 1.7.  Urinalysis was positive for trace leukocytes, negative nitrite, rare bacteria, 21-50 WBCs.  The patient himself feels improved.   Past Medical History:  Diagnosis Date  . Colon polyp   . COPD (chronic obstructive pulmonary disease) (HCC)   . Coronary artery disease   . Diverticulosis   . Esophageal stricture   . GERD (gastroesophageal reflux disease)   . Hyperlipidemia   . 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 Traumatic amputation of right leg (HCC)   . Vitamin D deficiency    Past Surgical History:  Procedure Laterality Date  . CARDIAC CATHETERIZATION  1983   No intervention performed.   . CATARACT EXTRACTION, BILATERAL    . COLONOSCOPY    . COLONOSCOPY W/ POLYPECTOMY  08/12/2010   Dr. Carl Gessner  . ESOPHAGOGASTRODUODENOSCOPY    . INGUINAL HERNIA REPAIR     left  . right leg amputation    . SHOULDER ARTHROSCOPY Left 2000  . UPPER GASTROINTESTINAL ENDOSCOPY      Home Medications:  Facility-Administered Medications Prior to Admission  Medication Dose Route Frequency Provider Last Rate Last Dose  . 0.9 %  sodium chloride infusion  500 mL Intravenous Continuous Gessner, Carl E, MD       Medications Prior to Admission  Medication Sig Dispense Refill Last Dose  . albuterol (PROVENTIL HFA;VENTOLIN HFA) 108 (90 Base) MCG/ACT inhaler Inhale 2 puffs into the lungs every 6 (six) hours as needed for wheezing or shortness of breath. 1 Inhaler 0 as needed  . albuterol (PROVENTIL) (2.5 MG/3ML) 0.083% nebulizer solution Take 3 mLs (2.5 mg total) by nebulization every 4 (four) hours as needed for wheezing or shortness of breath. 150 mL 1 Past Week at Unknown  time  . nitroGLYCERIN (NITROSTAT) 0.4 MG SL tablet Place 1 tablet (0.4 mg total) under the tongue every 5 (five) minutes x 3 doses as needed for chest pain. 25 tablet 4  at prn  . omeprazole (PRILOSEC) 40 MG capsule Take 1 capsule (40 mg total) by mouth daily before breakfast. 90 capsule 3 03/03/2018 at Unknown time  . atorvastatin (LIPITOR) 20 MG tablet Take 1 tablet (20 mg total) by mouth daily. (Patient not taking: Reported on 02/24/2018) 90 tablet 3 Not Taking at Unknown time  . losartan (COZAAR) 50 MG tablet Take 1 tablet (50 mg total) by mouth daily. (Patient not taking: Reported on 02/24/2018) 90 tablet 3 Not Taking at Unknown time  . Respiratory Therapy Supplies (NEBULIZER/TUBING/MOUTHPIECE) KIT Disp one nebulizer machine, tubing set and mouthpiece kit 1 each 0 Taking  . triamcinolone cream (KENALOG) 0.1 % Apply 1 application topically 2 (two) times daily. (Patient not taking: Reported on 03/04/2018) 30 g 0 Not Taking at Unknown time   Allergies: No Known Allergies  Family History  Problem Relation Age of Onset  . Hypertension Father   . Pancreatic cancer Mother   . Cancer Mother   . Colon cancer Brother   . Leukemia Brother   . Prostate cancer Brother   . Other Brother        polio  . Esophageal cancer Neg Hx   . Rectal cancer Neg Hx   .   Stomach cancer Neg Hx    Social History:  reports that he quit smoking about 17 years ago. His smoking use included cigarettes. He has a 60.00 pack-year smoking history. He has never used smokeless tobacco. He reports that he drank alcohol. He reports that he does not use drugs.  ROS: A complete review of systems was performed.  All systems are negative except for pertinent findings as noted. ROS   Physical Exam:  Vital signs in last 24 hours: Temp:  [97.5 F (36.4 C)] 97.5 F (36.4 C) (09/27 0935) Pulse Rate:  [54-86] 58 (09/27 1710) Resp:  [16-22] 22 (09/27 1710) BP: (135-159)/(71-95) 155/76 (09/27 1710) SpO2:  [95 %-100 %] 99 % (09/27  1710) Weight:  [55.5 kg] 55.5 kg (09/27 1652) General:  Alert and oriented, No acute distress HEENT: Normocephalic, atraumatic Neck: No JVD or lymphadenopathy Cardiovascular: Regular rate and rhythm Lungs: Regular rate and effort Abdomen: Soft, nontender, nondistended, no abdominal masses Back: No CVA tenderness Extremities: No edema Neurologic: Grossly intact  Laboratory Data:  Results for orders placed or performed during the hospital encounter of 03/04/18 (from the past 24 hour(s))  Lipase, blood     Status: None   Collection Time: 03/04/18  9:44 AM  Result Value Ref Range   Lipase 26 11 - 51 U/L  Comprehensive metabolic panel     Status: Abnormal   Collection Time: 03/04/18  9:44 AM  Result Value Ref Range   Sodium 135 135 - 145 mmol/L   Potassium 4.2 3.5 - 5.1 mmol/L   Chloride 99 98 - 111 mmol/L   CO2 26 22 - 32 mmol/L   Glucose, Bld 113 (H) 70 - 99 mg/dL   BUN 28 (H) 8 - 23 mg/dL   Creatinine, Ser 1.68 (H) 0.61 - 1.24 mg/dL   Calcium 9.1 8.9 - 10.3 mg/dL   Total Protein 7.1 6.5 - 8.1 g/dL   Albumin 3.6 3.5 - 5.0 g/dL   AST 24 15 - 41 U/L   ALT 25 0 - 44 U/L   Alkaline Phosphatase 79 38 - 126 U/L   Total Bilirubin 0.9 0.3 - 1.2 mg/dL   GFR calc non Af Amer 37 (L) >60 mL/min   GFR calc Af Amer 43 (L) >60 mL/min   Anion gap 10 5 - 15  CBC     Status: Abnormal   Collection Time: 03/04/18  9:44 AM  Result Value Ref Range   WBC 26.8 (H) 4.0 - 10.5 K/uL   RBC 4.91 4.22 - 5.81 MIL/uL   Hemoglobin 16.1 13.0 - 17.0 g/dL   HCT 48.7 39.0 - 52.0 %   MCV 99.2 78.0 - 100.0 fL   MCH 32.8 26.0 - 34.0 pg   MCHC 33.1 30.0 - 36.0 g/dL   RDW 13.7 11.5 - 15.5 %   Platelets 351 150 - 400 K/uL  Urinalysis, Routine w reflex microscopic     Status: Abnormal   Collection Time: 03/04/18  9:48 AM  Result Value Ref Range   Color, Urine YELLOW YELLOW   APPearance CLEAR CLEAR   Specific Gravity, Urine 1.024 1.005 - 1.030   pH 6.0 5.0 - 8.0   Glucose, UA NEGATIVE NEGATIVE mg/dL   Hgb  urine dipstick MODERATE (A) NEGATIVE   Bilirubin Urine NEGATIVE NEGATIVE   Ketones, ur NEGATIVE NEGATIVE mg/dL   Protein, ur 30 (A) NEGATIVE mg/dL   Nitrite NEGATIVE NEGATIVE   Leukocytes, UA TRACE (A) NEGATIVE   RBC / HPF 21-50 0 -   5 RBC/hpf   WBC, UA 21-50 0 - 5 WBC/hpf   Bacteria, UA RARE (A) NONE SEEN   Squamous Epithelial / LPF 0-5 0 - 5   WBC Clumps PRESENT    Mucus PRESENT    No results found for this or any previous visit (from the past 240 hour(s)). Creatinine: Recent Labs    03/04/18 0944  CREATININE 1.68*    Impression/Assessment:  Right ureteral calculus Hydronephrosis secondary to ureteral calculus Possible urinary tract infection  Plan:  Given his elevated white blood cell count of 27 and obstructing right ureteral calculus, recommend urgent right ureteral stent placement.  Discussed this with the patient.  He will need definitive management outpatient.  Deneshia Zucker D Miguel Medal, III 03/04/2018, 6:51 PM   

## 2018-03-04 NOTE — ED Triage Notes (Signed)
Pt presents with severe ongoing worsening lower right sided abdominal pain. Tender to touch. Referred to er for further evaluation.

## 2018-03-04 NOTE — Progress Notes (Signed)
New Admission Note: 03/04/2018   Arrival Method: stretcher Mental Orientation: alert and oriented Telemetry: n/a Assessment: completed Skin: assessed by 2 RNs IV: Rt AC NSL Pain: 5/10 Tubes: n/a Safety Measures: Reviewed with patient moderate fall status, bed alarms, call bell Admission:  completed 65M Orientation: done Family: at bedside upon arrival to unit Personal Belongings: dentures, clothing at bedside; cell phone with Vaughan Basta  Orders have been reviewed and implemented. Will continue to monitor the patient. Call light has been placed within reach and bed alarm has been activated.    Manya Silvas, RN MSN [place] Phone number: [phone]

## 2018-03-04 NOTE — Anesthesia Procedure Notes (Signed)
Procedure Name: LMA Insertion Date/Time: 03/04/2018 8:10 PM Performed by: Oletta Lamas, CRNA Pre-anesthesia Checklist: Patient identified, Emergency Drugs available, Suction available and Patient being monitored Patient Re-evaluated:Patient Re-evaluated prior to induction Oxygen Delivery Method: Circle System Utilized Preoxygenation: Pre-oxygenation with 100% oxygen Induction Type: IV induction Ventilation: Mask ventilation without difficulty LMA: LMA inserted LMA Size: 4.0 Number of attempts: 1 Placement Confirmation: positive ETCO2 Tube secured with: Tape Dental Injury: Teeth and Oropharynx as per pre-operative assessment

## 2018-03-05 ENCOUNTER — Encounter (HOSPITAL_COMMUNITY): Payer: Self-pay | Admitting: Internal Medicine

## 2018-03-05 DIAGNOSIS — N132 Hydronephrosis with renal and ureteral calculous obstruction: Secondary | ICD-10-CM | POA: Diagnosis not present

## 2018-03-05 DIAGNOSIS — I10 Essential (primary) hypertension: Secondary | ICD-10-CM | POA: Diagnosis not present

## 2018-03-05 DIAGNOSIS — N201 Calculus of ureter: Secondary | ICD-10-CM | POA: Diagnosis not present

## 2018-03-05 DIAGNOSIS — N200 Calculus of kidney: Secondary | ICD-10-CM | POA: Diagnosis not present

## 2018-03-05 DIAGNOSIS — R1031 Right lower quadrant pain: Secondary | ICD-10-CM | POA: Diagnosis not present

## 2018-03-05 DIAGNOSIS — D72829 Elevated white blood cell count, unspecified: Secondary | ICD-10-CM | POA: Diagnosis not present

## 2018-03-05 LAB — CBC
HCT: 40.4 % (ref 39.0–52.0)
Hemoglobin: 13.2 g/dL (ref 13.0–17.0)
MCH: 32.6 pg (ref 26.0–34.0)
MCHC: 32.7 g/dL (ref 30.0–36.0)
MCV: 99.8 fL (ref 78.0–100.0)
Platelets: 254 10*3/uL (ref 150–400)
RBC: 4.05 MIL/uL — ABNORMAL LOW (ref 4.22–5.81)
RDW: 13.8 % (ref 11.5–15.5)
WBC: 17.4 10*3/uL — AB (ref 4.0–10.5)

## 2018-03-05 LAB — BASIC METABOLIC PANEL
Anion gap: 8 (ref 5–15)
BUN: 23 mg/dL (ref 8–23)
CHLORIDE: 103 mmol/L (ref 98–111)
CO2: 22 mmol/L (ref 22–32)
Calcium: 8.5 mg/dL — ABNORMAL LOW (ref 8.9–10.3)
Creatinine, Ser: 1.29 mg/dL — ABNORMAL HIGH (ref 0.61–1.24)
GFR calc Af Amer: 60 mL/min — ABNORMAL LOW (ref >60)
GFR calc non Af Amer: 51 mL/min — ABNORMAL LOW (ref >60)
Glucose, Bld: 132 mg/dL — ABNORMAL HIGH (ref 70–99)
Potassium: 4.8 mmol/L (ref 3.5–5.1)
Sodium: 133 mmol/L — ABNORMAL LOW (ref 135–145)

## 2018-03-05 MED ORDER — LEVOFLOXACIN 500 MG PO TABS
250.0000 mg | ORAL_TABLET | Freq: Every day | ORAL | Status: DC
  Administered 2018-03-05: 250 mg via ORAL
  Filled 2018-03-05: qty 1

## 2018-03-05 MED ORDER — LEVOFLOXACIN 250 MG PO TABS: 250.0000 mg | ORAL_TABLET | Freq: Every day | ORAL | 0 refills | Status: AC

## 2018-03-05 NOTE — Discharge Summary (Addendum)
Physician Discharge Summary  Leslie Cardenas Leslie Cardenas:[id number] DOB: 1941/04/29 DOA: 03/04/2018  PCP: [name], MD  Admit date: 03/04/2018 Discharge date: 03/05/2018  Time spent: 60 minutes  Recommendations for Outpatient Follow-up:  1. Follow up with Dr [name]. Call office to schedule appointment 2. Take medications as instructed 3. BMP 1 week re: cr   Discharge Diagnoses:  Principal Problem:   Nephrolithiasis Active Problems:   Hydronephrosis   COPD (chronic obstructive pulmonary disease) (HCC)   Essential hypertension   Hx of right BKA (HCC)   GERD   Discharge Condition: stable  Diet recommendation: heart healthy  Filed Weights   03/04/18 1652 03/04/18 2157  Weight: 55.5 kg 55.4 kg    History of present illness:  77 year old male with hx gerd, cad, copd presented with right-sided flank pain.  CT scan revealed a 7 mm proximal ureteral calculus with upstream severe hydronephrosis.  White blood cell count was 27. He was afebrile and non-toxic appearing. Urology evaluated and recommended cystoscopy with stent.  Hospital Course:  Nephrolithiasis with Hydronephrosis on the right - Dr. [name] with urology evaluated and recommended cystoscopy with stent. Patient provided with levaquin. He did have leukocytosis which is trending down on day of discharge. He remained afebrile and non-toxic appearing.    AKI - Cr up to 1.68 on admission and 1.29 at discharge. Likely related to lack of PO and emesis.     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   Procedures:  Cystoscopy with stent  Consultations:  Dr bell urology  Discharge Exam: Vitals:   03/05/18 0354 03/05/18 0817  BP: 112/67 109/63  Pulse:  63  Resp:  16 18  Temp: 98 F (36.7 C) 98.2 F (36.8 C)  SpO2: 97% 95%    General: sitting up in bed smiling and eating breakfast. No acute distress Cardiovascular: rrr no mgr no LE edema on left Respiratory: normal effort BS clear bilaterally no wheeze  Discharge Instructions   Discharge Instructions    Amb Referral to Nutrition and Diabetic E   Complete by:  As directed    Diet - low sodium heart healthy   Complete by:  As directed    Increase activity slowly   Complete by:  As directed      Allergies as of 03/05/2018   No Known Allergies     Medication List    STOP taking these medications   atorvastatin 20 MG tablet Commonly known as:  LIPITOR   losartan 50 MG tablet Commonly known as:  COZAAR   triamcinolone cream 0.1 % Commonly known as:  KENALOG     TAKE these medications   albuterol 108 (90 Base) MCG/ACT inhaler Commonly known as:  PROVENTIL HFA;VENTOLIN HFA Inhale 2 puffs into the lungs every 6 (six) hours as needed for wheezing or shortness of breath.   albuterol (2.5 MG/3ML) 0.083% nebulizer solution Commonly known as:  PROVENTIL Take 3 mLs (2.5 mg total) by nebulization every 4 (four) hours as needed for wheezing or shortness of breath.   levofloxacin 250 MG tablet Commonly known as:  LEVAQUIN Take 1 tablet (250 mg total) by mouth daily for 6 days.   Nebulizer/Tubing/Mouthpiece Kit Disp one nebulizer machine, tubing  set and mouthpiece kit   nitroGLYCERIN 0.4 MG SL tablet Commonly known as:  NITROSTAT Place 1 tablet (0.4 mg total) under the tongue every 5 (five) minutes x 3 doses as needed for chest pain.   omeprazole 40 MG capsule Commonly known as:  PRILOSEC Take 1 capsule (40 mg total) by mouth daily before breakfast.      No Known Allergies Follow-up Information    [name], MD Follow up in 1 week(s).   Specialty:  Family Medicine Contact information: [address] [place] [address] [phone]        [name], MD Follow up.   Specialty:  Urology Why:  office will call with appointment Contact information: [name] [address] [phone]            The results of significant diagnostics from this hospitalization (including imaging, microbiology, ancillary and laboratory) are listed below for reference.    Significant Diagnostic Studies: Dg Chest 2 View  Result Date: 02/17/2018 CLINICAL DATA:  Chronic cough and chest congestion. EXAM: CHEST - 2 VIEW COMPARISON:  07/28/2017 FINDINGS: Cardiac silhouette is normal in size and configuration. No mediastinal or hilar masses. There is no evidence of adenopathy. Lungs are hyperexpanded, but clear. No pleural effusion or pneumothorax. Skeletal structures are intact. IMPRESSION: 1. No acute cardiopulmonary disease. 2. COPD. Electronically Signed   By: Lajean Manes M.D.   On: 02/17/2018 08:50   Ct Abdomen Pelvis W Contrast  Result Date: 03/04/2018 CLINICAL DATA:  77 year old male with a history of right lower quadrant pain EXAM: CT ABDOMEN AND PELVIS WITH CONTRAST TECHNIQUE: Multidetector CT imaging of the abdomen and pelvis was performed using the standard protocol following bolus administration of intravenous contrast. CONTRAST:  43m OMNIPAQUE IOHEXOL 300 MG/ML  SOLN COMPARISON:  None. FINDINGS: Lower chest: Atelectasis/scarring at the bilateral lung bases with no acute finding. Hepatobiliary: Small benign cystic structure within segment 8 at the liver dome, with otherwise unremarkable liver. Unremarkable gallbladder. Pancreas: Unremarkable pancreas Spleen: Unremarkable spleen Adrenals/Urinary Tract: Unremarkable bilateral adrenal glands. Right kidney with moderate to severe hydronephrosis and perinephric stranding. Nonobstructive stone at the lower pole collecting system measures 4 mm. Dilated right ureter with transition at obstructing 7 mm stone. Left kidney without hydronephrosis or nephrolithiasis. Unremarkable course of the  left ureter. Unremarkable urinary bladder. Stomach/Bowel: Hiatal hernia with otherwise unremarkable stomach. Unremarkable small bowel. Normal appendix. Minimal diverticular disease without evidence of acute diverticulitis. Vascular/Lymphatic: Atherosclerotic calcifications of the abdominal aorta and the bilateral iliac arteries. Bilateral iliac arteries and proximal femoral arteries patent. Atherosclerotic changes at the celiac artery and bilateral renal arteries. No lymphadenopathy. Reproductive: Unremarkable pelvic structures. Other: No large hernia. Musculoskeletal: No acute displaced fracture. Multilevel degenerative changes of the thoracolumbar spine. Degenerative changes of the bilateral hips. IMPRESSION: Right hydronephrosis secondary to obstructing ureteral stone measuring 7 mm. If there is concern for ascending urinary tract infection, recommend correlation with urinalysis. Aortic Atherosclerosis (ICD10-I70.0). Additional findings as above. Electronically Signed   By: JCorrie MckusickD.O.   On: 03/04/2018 13:15   Dg Cystogram  Result Date: 03/04/2018 CLINICAL DATA:  Intraoperative fluoroscopy with retrograde cystogram and stent placement. EXAM: CYSTOGRAM TECHNIQUE: After catheterization of the urinary bladder following sterile technique the bladder was filled with mL Cysto-Hypaque 30% by drip infusion. Serial spot images were obtained during bladder filling and post draining. FLUOROSCOPY TIME:  Fluoroscopy Time: 13.2  Seconds Radiation Exposure Index (if provided by the fluoroscopic device): 1.65  mGy Number of Acquired Spot Images: 4 COMPARISON:  03/04/2018 CT FINDINGS: Four intraoperative images of the right renal collecting system and ureter were provided during retrograde pyelogram with stent placement. Intraluminal filling defects outlined by the contrast are identified within the ureter more likely to represent air bubbles. Blunting of the renal calices compatible with a moderate degree of  hydronephrosis is demonstrated. The last image demonstrates the proximal coil of a right ureteral stent projecting up within the right renal pelvis adjacent to the base of the upper pole calyx. IMPRESSION: Fluoroscopic time utilized and retrograde pyelogram procedure performed of the right kidney demonstrating hydronephrosis. Subsequent placement of right-sided ureteral stent as above. No immediate intraoperative complicating features. Electronically Signed   By: Ashley Royalty M.D.   On: 03/04/2018 21:42    Microbiology: Recent Results (from the past 240 hour(s))  Surgical pcr screen     Status: None   Collection Time: 03/04/18  5:19 PM  Result Value Ref Range Status   MRSA, PCR NEGATIVE NEGATIVE Final   Staphylococcus aureus NEGATIVE NEGATIVE Final    Comment: (NOTE) The Xpert SA Assay (FDA approved for NASAL specimens in patients 57 years of age and older), is one component of a comprehensive surveillance program. It is not intended to diagnose infection nor to guide or monitor treatment. Performed at [facility], [address] [place], [place] [address]      Labs: Basic Metabolic Panel: Recent Labs  Lab 03/04/18 0944 03/05/18 0433  NA 135 133*  K 4.2 4.8  CL 99 103  CO2 26 22  GLUCOSE 113* 132*  BUN 28* 23  CREATININE 1.68* 1.29*  CALCIUM 9.1 8.5*   Liver Function Tests: Recent Labs  Lab 03/04/18 0944  AST 24  ALT 25  ALKPHOS 79  BILITOT 0.9  PROT 7.1  ALBUMIN 3.6   Recent Labs  Lab 03/04/18 0944  LIPASE 26   No results for input(s): AMMONIA in the last 168 hours. CBC: Recent Labs  Lab 03/04/18 0944 03/05/18 0433  WBC 26.8* 17.4*  NEUTROABS 22.8*  --   HGB 16.1 13.2  HCT 48.7 40.4  MCV 99.2 99.8  PLT 351 254   Cardiac Enzymes: No results for input(s): CKTOTAL, CKMB, CKMBINDEX, TROPONINI in the last 168 hours. BNP: BNP (last 3 results) No results for input(s): BNP in the last 8760 hours.  ProBNP (last 3 results) No results for input(s):  PROBNP in the last 8760 hours.  CBG: No results for input(s): GLUCAP in the last 168 hours.     Signed:  Eulogio Bear DO [facility] 03/05/2018, 1:20 PM

## 2018-03-05 NOTE — Progress Notes (Signed)
Urology Inpatient Progress Report  Ureteral stone [N20.1]  Procedure(s): CYSTOSCOPY WITH RETROGRADE PYELOGRAM/URETERAL STENT PLACEMENT  1 Day Post-Op   Intv/Subj: No acute events overnight. Patient is without complaint. Creatinine and leukocytosis improving.  Afebrile vital signs stable  Active Problems:   COPD (chronic obstructive pulmonary disease) (HCC)   Essential hypertension   Hx of right BKA (HCC)   GERD   Nephrolithiasis   Hydronephrosis  Current Facility-Administered Medications  Medication Dose Route Frequency Provider Last Rate Last Dose  . acetaminophen (TYLENOL) tablet 650 mg  650 mg Oral Q6H PRN Sid Falcon, MD       Or  . acetaminophen (TYLENOL) suppository 650 mg  650 mg Rectal Q6H PRN Gilles Chiquito B, MD      . albuterol (PROVENTIL) (2.5 MG/3ML) 0.083% nebulizer solution 2.5 mg  2.5 mg Nebulization Q4H PRN Gilles Chiquito B, MD      . heparin injection 5,000 Units  5,000 Units Subcutaneous Q8H Gilles Chiquito B, MD      . lactated ringers infusion   Intravenous Continuous Ellender, Karyl Kinnier, MD      . Levofloxacin (LEVAQUIN) IVPB 250 mg  250 mg Intravenous Q24H Joselyn Glassman A, RPH 50 mL/hr at 03/04/18 1827 250 mg at 03/04/18 1827  . morphine 2 MG/ML injection 2 mg  2 mg Intravenous Q2H PRN Sid Falcon, MD   2 mg at 03/04/18 1603  . nitroGLYCERIN (NITROSTAT) SL tablet 0.4 mg  0.4 mg Sublingual Q5 Min x 3 PRN Gilles Chiquito B, MD      . ondansetron [facility]) injection 4 mg  4 mg Intravenous Q6H PRN Sid Falcon, MD   4 mg at 03/04/18 2024  . oxyCODONE (Oxy IR/ROXICODONE) immediate release tablet 5 mg  5 mg Oral Q4H PRN Sid Falcon, MD   5 mg at 03/04/18 1732  . pantoprazole (PROTONIX) EC tablet 40 mg  40 mg Oral Daily Gilles Chiquito B, MD   40 mg at 03/04/18 1732  . sodium chloride flush (NS) 0.9 % injection 3 mL  3 mL Intravenous Q12H Gilles Chiquito B, MD         Objective: Vital: Vitals:   03/04/18 2115 03/04/18 2157 03/05/18 0354 03/05/18 0817   BP: 134/86 122/60 112/67 109/63  Pulse: 75 71  63  Resp: 17 16 16 18   Temp:  97.6 F (36.4 C) 98 F (36.7 C) 98.2 F (36.8 C)  TempSrc:  Oral Oral Oral  SpO2: 100% 90% 97% 95%  Weight:  55.4 kg    Height:       I/Os: I/O last 3 completed shifts: In: 2003.8 [P.O.:240; I.V.:1263.8; IV Piggyback:500] Out: 5 [Blood:5]  Physical Exam:  General: Patient is in no apparent distress Lungs: Normal respiratory effort, chest expands symmetrically. GI: The abdomen is soft and nontender without mass. Foley: Clear yellow urine Ext: lower extremities symmetric  Lab Results: Recent Labs    03/04/18 0944 03/05/18 0433  WBC 26.8* 17.4*  HGB 16.1 13.2  HCT 48.7 40.4   Recent Labs    03/04/18 0944 03/05/18 0433  NA 135 133*  K 4.2 4.8  CL 99 103  CO2 26 22  GLUCOSE 113* 132*  BUN 28* 23  CREATININE 1.68* 1.29*  CALCIUM 9.1 8.5*   No results for input(s): LABPT, INR in the last 72 hours. No results for input(s): LABURIN in the last 72 hours. Results for orders placed or performed during the hospital encounter of 03/04/18  Surgical pcr screen  Status: None   Collection Time: 03/04/18  5:19 PM  Result Value Ref Range Status   MRSA, PCR NEGATIVE NEGATIVE Final   Staphylococcus aureus NEGATIVE NEGATIVE Final    Comment: (NOTE) The Xpert SA Assay (FDA approved for NASAL specimens in patients 95 years of age and older), is one component of a comprehensive surveillance program. It is not intended to diagnose infection nor to guide or monitor treatment. Performed at [facility], [place] [address], [place], [place] [address]     Studies/Results: Ct Abdomen Pelvis W Contrast  Result Date: 03/04/2018 CLINICAL DATA:  77 year old male with a history of right lower quadrant pain EXAM: CT ABDOMEN AND PELVIS WITH CONTRAST TECHNIQUE: Multidetector CT imaging of the abdomen and pelvis was performed using the standard protocol following bolus administration of intravenous contrast.  CONTRAST:  30mL OMNIPAQUE IOHEXOL 300 MG/ML  SOLN COMPARISON:  None. FINDINGS: Lower chest: Atelectasis/scarring at the bilateral lung bases with no acute finding. Hepatobiliary: Small benign cystic structure within segment 8 at the liver dome, with otherwise unremarkable liver. Unremarkable gallbladder. Pancreas: Unremarkable pancreas Spleen: Unremarkable spleen Adrenals/Urinary Tract: Unremarkable bilateral adrenal glands. Right kidney with moderate to severe hydronephrosis and perinephric stranding. Nonobstructive stone at the lower pole collecting system measures 4 mm. Dilated right ureter with transition at obstructing 7 mm stone. Left kidney without hydronephrosis or nephrolithiasis. Unremarkable course of the left ureter. Unremarkable urinary bladder. Stomach/Bowel: Hiatal hernia with otherwise unremarkable stomach. Unremarkable small bowel. Normal appendix. Minimal diverticular disease without evidence of acute diverticulitis. Vascular/Lymphatic: Atherosclerotic calcifications of the abdominal aorta and the bilateral iliac arteries. Bilateral iliac arteries and proximal femoral arteries patent. Atherosclerotic changes at the celiac artery and bilateral renal arteries. No lymphadenopathy. Reproductive: Unremarkable pelvic structures. Other: No large hernia. Musculoskeletal: No acute displaced fracture. Multilevel degenerative changes of the thoracolumbar spine. Degenerative changes of the bilateral hips. IMPRESSION: Right hydronephrosis secondary to obstructing ureteral stone measuring 7 mm. If there is concern for ascending urinary tract infection, recommend correlation with urinalysis. Aortic Atherosclerosis (ICD10-I70.0). Additional findings as above. Electronically Signed   By: Corrie Mckusick D.O.   On: 03/04/2018 13:15   Dg Cystogram  Result Date: 03/04/2018 CLINICAL DATA:  Intraoperative fluoroscopy with retrograde cystogram and stent placement. EXAM: CYSTOGRAM TECHNIQUE: After catheterization of the  urinary bladder following sterile technique the bladder was filled with mL Cysto-Hypaque 30% by drip infusion. Serial spot images were obtained during bladder filling and post draining. FLUOROSCOPY TIME:  Fluoroscopy Time: 13.2  Seconds Radiation Exposure Index (if provided by the fluoroscopic device): 1.65 mGy Number of Acquired Spot Images: 4 COMPARISON:  03/04/2018 CT FINDINGS: Four intraoperative images of the right renal collecting system and ureter were provided during retrograde pyelogram with stent placement. Intraluminal filling defects outlined by the contrast are identified within the ureter more likely to represent air bubbles. Blunting of the renal calices compatible with a moderate degree of hydronephrosis is demonstrated. The last image demonstrates the proximal coil of a right ureteral stent projecting up within the right renal pelvis adjacent to the base of the upper pole calyx. IMPRESSION: Fluoroscopic time utilized and retrograde pyelogram procedure performed of the right kidney demonstrating hydronephrosis. Subsequent placement of right-sided ureteral stent as above. No immediate intraoperative complicating features. Electronically Signed   By: Ashley Royalty M.D.   On: 03/04/2018 21:42    Assessment: Right ureteral calculus Possible urinary tract infection  Procedure(s): CYSTOSCOPY WITH RETROGRADE PYELOGRAM/URETERAL STENT PLACEMENT, 1 Day Post-Op  doing well.  Plan: Okay to  discharge from my standpoint.  I put in order to discontinue Foley catheter.  Please send home with 7 days of levofloxacin.  Plan see him in my office.   Link Snuffer, MD Urology 03/05/2018, 9:13 AM

## 2018-03-05 NOTE — Progress Notes (Signed)
Pharmacy Antibiotic Note  Leslie Cardenas is a 77 y.o. male admitted on 03/04/2018 with UTI.  Pharmacy has been consulted for levofloxacin dosing.  Abx for complicated UTI. Urology on board. WBC elevated at 27. WBC now down to 17.4. Placed R ureteral stent on 9/27. No cx's? Urology recommends 7 days of Levaquin  Plan: Transition Levaquin 250mg  to PO daily. Will give 6 more doses to complete 7 day course  Height: 5\' 10"  (177.8 cm) Weight: 122 lb 2.2 oz (55.4 kg) IBW/kg (Calculated) : 73  Temp (24hrs), Avg:97.8 F (36.6 C), Min:97.5 F (36.4 C), Max:98.2 F (36.8 C)  Recent Labs  Lab 03/04/18 0944 03/05/18 0433  WBC 26.8* 17.4*  CREATININE 1.68* 1.29*    Estimated Creatinine Clearance: 37 mL/min (A) (by C-G formula based on SCr of 1.29 mg/dL (H)).    No Known Allergies   Elenor Quinones, PharmD, BCPS Clinical Pharmacist Phone number [phone] 03/05/2018 9:31 AM

## 2018-03-05 NOTE — Discharge Instructions (Signed)

## 2018-03-05 NOTE — Progress Notes (Signed)
Initial Nutrition Assessment  DOCUMENTATION CODES:   Severe malnutrition in context of acute illness/injury, Underweight  INTERVENTION:   -Ensure Enlive po TID, each supplement provides 350 kcal and 20 grams of protein -MVI with minerals daily -Provided education on high protein, high calorie diet for weight restoration. Provided "Tips for Increasing Calories and Protein" handout from [facility] Nutrition Care Manual. Also referred pt to [facility]'s Nutrition and Diabetes Education Services for further education per his request  NUTRITION DIAGNOSIS:   Severe Malnutrition related to acute illness(dysphagia 2/2 esophageal stricture) as evidenced by percent weight loss, moderate fat depletion, severe fat depletion, moderate muscle depletion, severe muscle depletion.  GOAL:   Patient will meet greater than or equal to 90% of their needs  MONITOR:   PO intake, Supplement acceptance, Labs, Weight trends, Skin, I & O's  REASON FOR ASSESSMENT:   Malnutrition Screening Tool    ASSESSMENT:    Leslie Cardenas is a 77 y.o. male with medical history significant of HLD, GERD, CAD, COPD who presents for abdominal pain, nausea and vomiting since Tuesday (3 days).  He has no history of abdominal surgery.  Pain is sharp and comes and goes, in the RLQ and groin.  He has had no blood in the urine. CT scan done by the ED showed a 80mm stone in the right ureter with hydronephrosis.  He denied any other symptoms except for constipation and chills.  He denied dizziness, lightheaded, fever, SOB, nausea, vomiting, leg pain, leg swelling, urinary symptoms, heartburn.    Pt admitted with nephrolithiasis with hydronephrosis on the right.   9/27- s/p cystoscopy with right retrograde pyelogram and right ureteral stent placement  Spoke with pt at bedside, who reports decreased oral intake over the past 3 months related to dysphagia. Per pt, he had an esophageal stricture which he recently got stretched  approximately one week ago. His intake has improved immensely since them. Pt reports he consumes 3 meals per day, but was consuming about 75% less of meals as he usually does. Pt reports that he will fix a full plate of food, but will only consume "a palm full" of the plate.   Pt endorses a 25-30# wt loss over the past 3 months. Reviewed wt hx, which reveals a 14.6% wt loss over the past 5 months, which is significant for time frame. Pt reports that he has been "searching for the foods with the highest calories" and been consuming at least 3 Ensure supplements daily to regain weight lost during this time period. Discussed ways pt could increase calories and protein in his diet. Also encouraged pt to continue consuming Ensure at home. Pt is hopeful to work with an RD as an outpatient; RD referred pt to [facility]'s Nutrition and Diabetes Education Services.   Labs reviewed: Na: 133.   NUTRITION - FOCUSED PHYSICAL EXAM:    Most Recent Value  Orbital Region  Mild depletion  Upper Arm Region  Moderate depletion  Thoracic and Lumbar Region  Moderate depletion  Buccal Region  Mild depletion  Temple Region  Mild depletion  Clavicle Bone Region  Moderate depletion  Clavicle and Acromion Bone Region  Moderate depletion  Scapular Bone Region  Moderate depletion  Dorsal Hand  No depletion  Patellar Region  Moderate depletion  Anterior Thigh Region  Moderate depletion  Posterior Calf Region  Moderate depletion  Edema (RD Assessment)  None  Hair  Reviewed  Eyes  Reviewed  Mouth  Reviewed  Skin  Reviewed  Nails  Reviewed       Diet Order:   Diet Order            Diet - low sodium heart healthy        Diet regular Room service appropriate? Yes; Fluid consistency: Thin  Diet effective now              EDUCATION NEEDS:   Education needs have been addressed  Skin:  Skin Assessment: Skin Integrity Issues: Skin Integrity Issues:: Incisions Incisions: closed penile incision  Last BM:   03/01/18  Height:   Ht Readings from Last 1 Encounters:  03/04/18 5\' 10"  (1.778 m)    Weight:   Wt Readings from Last 1 Encounters:  03/04/18 55.4 kg    Ideal Body Weight:  75.5 kg  BMI:  Body mass index is 17.52 kg/m.  Estimated Nutritional Needs:   Kcal:  1900-2100  Protein:  95-110 grams  Fluid:  1.9-2.1 L    Danique Hartsough A. Jimmye Norman, RD, LDN, CDE Pager: [phone] After hours Pager: [phone]

## 2018-03-05 NOTE — Anesthesia Postprocedure Evaluation (Signed)
Anesthesia Post Note  Patient: Leslie Cardenas  Procedure(s) Performed: CYSTOSCOPY WITH RETROGRADE PYELOGRAM/URETERAL STENT PLACEMENT (Right Urethra)     Patient location during evaluation: PACU Anesthesia Type: General Level of consciousness: awake and alert Pain management: pain level controlled Vital Signs Assessment: post-procedure vital signs reviewed and stable Respiratory status: spontaneous breathing, nonlabored ventilation, respiratory function stable and patient connected to nasal cannula oxygen Cardiovascular status: blood pressure returned to baseline and stable Postop Assessment: no apparent nausea or vomiting Anesthetic complications: no    Last Vitals:  Vitals:   03/04/18 2157 03/05/18 0354  BP: 122/60 112/67  Pulse: 71   Resp: 16 16  Temp: 36.4 C 36.7 C  SpO2: 90% 97%    Last Pain:  Vitals:   03/05/18 0354  TempSrc: Oral  PainSc:                  Karyl Kinnier Peter Daquila

## 2018-03-06 ENCOUNTER — Encounter (HOSPITAL_COMMUNITY): Payer: Self-pay | Admitting: Urology

## 2018-03-09 ENCOUNTER — Other Ambulatory Visit: Payer: Self-pay | Admitting: Urology

## 2018-03-11 ENCOUNTER — Other Ambulatory Visit: Payer: Self-pay

## 2018-03-11 ENCOUNTER — Encounter (HOSPITAL_BASED_OUTPATIENT_CLINIC_OR_DEPARTMENT_OTHER): Payer: Self-pay

## 2018-03-11 NOTE — Progress Notes (Signed)
Spoke with:  Mallie Mussel NPO:  After Midnight, no gum, candy, or mints   Arrival time: 3151VO Labs: Istat 8 (EKG9/27/2019 chart/epic) AM medications:  Omeprazole Pre op orders: Yes Ride home:  Vaughan Basta (ex-wife) [phone]

## 2018-03-15 ENCOUNTER — Encounter (HOSPITAL_BASED_OUTPATIENT_CLINIC_OR_DEPARTMENT_OTHER): Payer: Self-pay | Admitting: Anesthesiology

## 2018-03-15 DIAGNOSIS — N132 Hydronephrosis with renal and ureteral calculous obstruction: Secondary | ICD-10-CM | POA: Diagnosis not present

## 2018-03-15 DIAGNOSIS — N201 Calculus of ureter: Secondary | ICD-10-CM | POA: Diagnosis not present

## 2018-03-15 DIAGNOSIS — N481 Balanitis: Secondary | ICD-10-CM | POA: Diagnosis not present

## 2018-03-15 NOTE — Anesthesia Preprocedure Evaluation (Addendum)
Anesthesia Evaluation  Patient identified by MRN, date of birth, ID band Patient awake    Reviewed: Allergy & Precautions, NPO status , Patient's Chart, lab work & pertinent test results, reviewed documented beta blocker date and time   Airway Mallampati: II  TM Distance: >3 FB Neck ROM: Full    Dental no notable dental hx. (+) Upper Dentures   Pulmonary pneumonia, resolved, COPD,  COPD inhaler, former smoker,    Pulmonary exam normal breath sounds clear to auscultation       Cardiovascular hypertension, Pt. on medications and Pt. on home beta blockers + CAD and + Past MI  Normal cardiovascular exam Rhythm:Regular Rate:Normal  MI - 1983 Echo 08/09/2017-Left ventricle: The cavity size was normal. Wall thickness was normal. Systolic function was normal. The estimated ejection fraction was in the range of 55% to 60%. Wall motion was normal; there were no regional wall motion abnormalities. Doppler parameters are consistent with abnormal left ventricular relaxation (grade 1 diastolic dysfunction).  Impressions:  - Normal LV systolic function; mild diastolic dysfunction.  EKG 03/04/2018- NSR, Normal   Neuro/Psych negative neurological ROS  negative psych ROS   GI/Hepatic hiatal hernia, GERD  Medicated and Controlled,  Endo/Other  Hyperlipidemia   Renal/GU Renal diseaseRight ureteral calculus   negative genitourinary   Musculoskeletal S/P Right BKA   Abdominal   Peds  Hematology negative hematology ROS (+)   Anesthesia Other Findings   Reproductive/Obstetrics                         Anesthesia Physical Anesthesia Plan  ASA: III  Anesthesia Plan: General   Post-op Pain Management:    Induction: Intravenous  PONV Risk Score and Plan: 3 and 4 or greater and Ondansetron and Dexamethasone  Airway Management Planned: LMA  Additional Equipment:   Intra-op Plan:   Post-operative Plan:  Extubation in OR  Informed Consent: I have reviewed the patients History and Physical, chart, labs and discussed the procedure including the risks, benefits and alternatives for the proposed anesthesia with the patient or authorized representative who has indicated his/her understanding and acceptance.   Dental advisory given  Plan Discussed with: CRNA and Surgeon  Anesthesia Plan Comments:        Anesthesia Quick Evaluation

## 2018-03-16 ENCOUNTER — Ambulatory Visit (HOSPITAL_BASED_OUTPATIENT_CLINIC_OR_DEPARTMENT_OTHER): Payer: Medicare HMO | Admitting: Anesthesiology

## 2018-03-16 ENCOUNTER — Encounter (HOSPITAL_BASED_OUTPATIENT_CLINIC_OR_DEPARTMENT_OTHER): Payer: Self-pay

## 2018-03-16 ENCOUNTER — Other Ambulatory Visit: Payer: Self-pay

## 2018-03-16 ENCOUNTER — Ambulatory Visit (HOSPITAL_BASED_OUTPATIENT_CLINIC_OR_DEPARTMENT_OTHER)
Admission: RE | Admit: 2018-03-16 | Discharge: 2018-03-16 | Disposition: A | Discharge status: Home / Self Care | Payer: Medicare HMO | Source: Ambulatory Visit | Attending: Urology | Admitting: Urology

## 2018-03-16 ENCOUNTER — Encounter (HOSPITAL_BASED_OUTPATIENT_CLINIC_OR_DEPARTMENT_OTHER)
Admission: RE | Disposition: A | Discharge status: Home / Self Care | Payer: Self-pay | Source: Ambulatory Visit | Attending: Urology

## 2018-03-16 DIAGNOSIS — I252 Old myocardial infarction: Secondary | ICD-10-CM | POA: Insufficient documentation

## 2018-03-16 DIAGNOSIS — E785 Hyperlipidemia, unspecified: Secondary | ICD-10-CM | POA: Insufficient documentation

## 2018-03-16 DIAGNOSIS — Z800 Family history of malignant neoplasm of digestive organs: Secondary | ICD-10-CM | POA: Diagnosis not present

## 2018-03-16 DIAGNOSIS — K219 Gastro-esophageal reflux disease without esophagitis: Secondary | ICD-10-CM | POA: Insufficient documentation

## 2018-03-16 DIAGNOSIS — Z89511 Acquired absence of right leg below knee: Secondary | ICD-10-CM | POA: Insufficient documentation

## 2018-03-16 DIAGNOSIS — E559 Vitamin D deficiency, unspecified: Secondary | ICD-10-CM | POA: Insufficient documentation

## 2018-03-16 DIAGNOSIS — Z79899 Other long term (current) drug therapy: Secondary | ICD-10-CM | POA: Insufficient documentation

## 2018-03-16 DIAGNOSIS — N132 Hydronephrosis with renal and ureteral calculous obstruction: Secondary | ICD-10-CM | POA: Diagnosis not present

## 2018-03-16 DIAGNOSIS — Z87891 Personal history of nicotine dependence: Secondary | ICD-10-CM | POA: Insufficient documentation

## 2018-03-16 DIAGNOSIS — J449 Chronic obstructive pulmonary disease, unspecified: Secondary | ICD-10-CM | POA: Diagnosis not present

## 2018-03-16 DIAGNOSIS — I10 Essential (primary) hypertension: Secondary | ICD-10-CM | POA: Diagnosis not present

## 2018-03-16 DIAGNOSIS — I2510 Atherosclerotic heart disease of native coronary artery without angina pectoris: Secondary | ICD-10-CM | POA: Insufficient documentation

## 2018-03-16 DIAGNOSIS — R69 Illness, unspecified: Secondary | ICD-10-CM | POA: Diagnosis not present

## 2018-03-16 DIAGNOSIS — N201 Calculus of ureter: Secondary | ICD-10-CM | POA: Diagnosis not present

## 2018-03-16 HISTORY — DX: Heart disease, unspecified: I51.9

## 2018-03-16 HISTORY — DX: Unspecified hydronephrosis: N13.30

## 2018-03-16 HISTORY — DX: Calculus of ureter: N20.1

## 2018-03-16 HISTORY — DX: Presence of dental prosthetic device (complete) (partial): Z97.2

## 2018-03-16 HISTORY — DX: Esophageal obstruction: K22.2

## 2018-03-16 HISTORY — DX: Unspecified malignant neoplasm of skin, unspecified: C44.90

## 2018-03-16 HISTORY — DX: Personal history of other diseases of the digestive system: Z87.19

## 2018-03-16 HISTORY — PX: CYSTOSCOPY/URETEROSCOPY/HOLMIUM LASER/STENT PLACEMENT: SHX6546

## 2018-03-16 HISTORY — DX: Unspecified cataract: H26.9

## 2018-03-16 HISTORY — DX: Atherosclerosis of aorta: I70.0

## 2018-03-16 LAB — POCT I-STAT, CHEM 8
BUN: 16 mg/dL (ref 8–23)
CHLORIDE: 99 mmol/L (ref 98–111)
CREATININE: 1.1 mg/dL (ref 0.61–1.24)
Calcium, Ion: 1.28 mmol/L (ref 1.15–1.40)
GLUCOSE: 88 mg/dL (ref 70–99)
HCT: 44 % (ref 39.0–52.0)
Hemoglobin: 15 g/dL (ref 13.0–17.0)
Potassium: 4.1 mmol/L (ref 3.5–5.1)
Sodium: 139 mmol/L (ref 135–145)
TCO2: 27 mmol/L (ref 22–32)

## 2018-03-16 SURGERY — CYSTOSCOPY/URETEROSCOPY/HOLMIUM LASER/STENT PLACEMENT
Anesthesia: General | Laterality: Right

## 2018-03-16 MED ORDER — ONDANSETRON HCL 4 MG/2ML IJ SOLN
4.0000 mg | Freq: Once | INTRAMUSCULAR | Status: DC | PRN
  Filled 2018-03-16: qty 2

## 2018-03-16 MED ORDER — HYDROCODONE-ACETAMINOPHEN 5-325 MG PO TABS: 1.0000 | ORAL_TABLET | ORAL | 0 refills | Status: DC | PRN

## 2018-03-16 MED ORDER — FENTANYL CITRATE (PF) 100 MCG/2ML IJ SOLN
INTRAMUSCULAR | Status: DC | PRN
  Administered 2018-03-16: 50 ug via INTRAVENOUS

## 2018-03-16 MED ORDER — PHENYLEPHRINE 40 MCG/ML (10ML) SYRINGE FOR IV PUSH (FOR BLOOD PRESSURE SUPPORT)
PREFILLED_SYRINGE | INTRAVENOUS | Status: AC
  Filled 2018-03-16: qty 10

## 2018-03-16 MED ORDER — ARTIFICIAL TEARS OPHTHALMIC OINT
TOPICAL_OINTMENT | OPHTHALMIC | Status: AC
  Filled 2018-03-16: qty 3.5

## 2018-03-16 MED ORDER — IOHEXOL 300 MG/ML  SOLN
INTRAMUSCULAR | Status: DC | PRN
  Administered 2018-03-16: 10 mL via URETHRAL

## 2018-03-16 MED ORDER — FENTANYL CITRATE (PF) 100 MCG/2ML IJ SOLN
INTRAMUSCULAR | Status: AC
  Filled 2018-03-16: qty 2

## 2018-03-16 MED ORDER — LACTATED RINGERS IV SOLN
INTRAVENOUS | Status: DC
  Administered 2018-03-16: 1000 mL via INTRAVENOUS
  Filled 2018-03-16: qty 1000

## 2018-03-16 MED ORDER — CEFAZOLIN SODIUM-DEXTROSE 2-4 GM/100ML-% IV SOLN
INTRAVENOUS | Status: AC
  Filled 2018-03-16: qty 100

## 2018-03-16 MED ORDER — HYDROCODONE-ACETAMINOPHEN 7.5-325 MG PO TABS
1.0000 | ORAL_TABLET | Freq: Once | ORAL | Status: DC | PRN
  Filled 2018-03-16: qty 1

## 2018-03-16 MED ORDER — CEFAZOLIN SODIUM-DEXTROSE 2-4 GM/100ML-% IV SOLN
2.0000 g | INTRAVENOUS | Status: AC
  Administered 2018-03-16: 2 g via INTRAVENOUS
  Filled 2018-03-16: qty 100

## 2018-03-16 MED ORDER — ONDANSETRON HCL 4 MG/2ML IJ SOLN
INTRAMUSCULAR | Status: AC
  Filled 2018-03-16: qty 2

## 2018-03-16 MED ORDER — LIDOCAINE HCL (CARDIAC) PF 100 MG/5ML IV SOSY
PREFILLED_SYRINGE | INTRAVENOUS | Status: AC
  Filled 2018-03-16: qty 5

## 2018-03-16 MED ORDER — FENTANYL CITRATE (PF) 100 MCG/2ML IJ SOLN
25.0000 ug | INTRAMUSCULAR | Status: DC | PRN
  Filled 2018-03-16: qty 1

## 2018-03-16 MED ORDER — PROPOFOL 10 MG/ML IV BOLUS
INTRAVENOUS | Status: DC | PRN
  Administered 2018-03-16: 120 mg via INTRAVENOUS

## 2018-03-16 MED ORDER — LIDOCAINE 2% (20 MG/ML) 5 ML SYRINGE
INTRAMUSCULAR | Status: DC | PRN
  Administered 2018-03-16: 50 mg via INTRAVENOUS

## 2018-03-16 MED ORDER — MEPERIDINE HCL 25 MG/ML IJ SOLN
6.2500 mg | INTRAMUSCULAR | Status: DC | PRN
  Filled 2018-03-16: qty 1

## 2018-03-16 MED ORDER — DEXAMETHASONE SODIUM PHOSPHATE 4 MG/ML IJ SOLN
INTRAMUSCULAR | Status: DC | PRN
  Administered 2018-03-16: 10 mg via INTRAVENOUS

## 2018-03-16 MED ORDER — PROPOFOL 10 MG/ML IV BOLUS
INTRAVENOUS | Status: AC
  Filled 2018-03-16: qty 20

## 2018-03-16 MED ORDER — DEXAMETHASONE SODIUM PHOSPHATE 10 MG/ML IJ SOLN
INTRAMUSCULAR | Status: AC
  Filled 2018-03-16: qty 1

## 2018-03-16 SURGICAL SUPPLY — 19 items
BAG DRAIN URO-CYSTO SKYTR STRL (DRAIN) ×2 IMPLANT
CATH INTERMIT  6FR 70CM (CATHETERS) IMPLANT
CLOTH BEACON ORANGE TIMEOUT ST (SAFETY) ×2 IMPLANT
FIBER LASER FLEXIVA 365 (UROLOGICAL SUPPLIES) IMPLANT
FIBER LASER TRAC TIP (UROLOGICAL SUPPLIES) ×2 IMPLANT
GLOVE BIO SURGEON STRL SZ7.5 (GLOVE) ×2 IMPLANT
GOWN STRL REUS W/TWL XL LVL3 (GOWN DISPOSABLE) IMPLANT
GUIDEWIRE STR DUAL SENSOR (WIRE) ×2 IMPLANT
INFUSOR MANOMETER BAG 3000ML (MISCELLANEOUS) ×2 IMPLANT
IV NS 1000ML (IV SOLUTION) ×1
IV NS 1000ML BAXH (IV SOLUTION) ×1 IMPLANT
IV NS IRRIG 3000ML ARTHROMATIC (IV SOLUTION) ×2 IMPLANT
KIT TURNOVER CYSTO (KITS) ×2 IMPLANT
MANIFOLD NEPTUNE II (INSTRUMENTS) ×2 IMPLANT
NS IRRIG 500ML POUR BTL (IV SOLUTION) ×4 IMPLANT
PACK CYSTO (CUSTOM PROCEDURE TRAY) ×2 IMPLANT
STENT URET 6FRX26 CONTOUR (STENTS) ×2 IMPLANT
TUBE CONNECTING 12X1/4 (SUCTIONS) ×2 IMPLANT
TUBING UROLOGY SET (TUBING) ×2 IMPLANT

## 2018-03-16 NOTE — Transfer of Care (Signed)
  Last Vitals:  Vitals Value Taken Time  BP 156/87 03/16/2018  9:07 AM  Temp 36.4 C 03/16/2018  9:07 AM  Pulse 63 03/16/2018  9:14 AM  Resp 11 03/16/2018  9:14 AM  SpO2 100 % 03/16/2018  9:14 AM  Vitals shown include unvalidated device data.  Last Pain:  Vitals:   03/16/18 4709  TempSrc:   PainSc: Asleep      Patients Stated Pain Goal: 8 (03/16/18 0705) Immediate Anesthesia Transfer of Care Note  Patient: Leslie Cardenas  Procedure(s) Performed: Procedure(s) (LRB): CYSTOSCOPY RIGHT URETEROSCOPY/HOLMIUM LASER/STENT PLACEMENT (Right)  Patient Location: PACU  Anesthesia Type: General  Level of Consciousness: awake, alert  and oriented  Airway & Oxygen Therapy: Patient Spontanous Breathing and Patient connected to nasal cannula oxygen. Post-op Assessment: Report given to PACU RN and Post -op Vital signs reviewed and stable  Post vital signs: Reviewed and stable  Complications: No apparent anesthesia complications

## 2018-03-16 NOTE — Op Note (Signed)
Operative Note  Preoperative diagnosis:  1.  Right ureteral calculus  Postoperative diagnosis: 1.  Right ureteral calculus  Procedure(s): 1.  Cystoscopy with right retrograde pyelogram, right ureteroscopy with laser lithotripsy, ureteral stent placement-exchange  Surgeon: Link Snuffer, MD  Assistants: None  Anesthesia: General  Complications: None immediate  EBL: Minimal  Specimens: 1.  None  Drains/Catheters: 1.  6 x 26 double-J ureteral stent  Intraoperative findings: Normal urethra and bladder 2.  Right retrograde pyelogram revealed a well opacified kidney with no evidence of filling defects and improved hydronephrosis 3.  7 mm proximal ureteral calculus fragmented to tiny fragments.  Indication: 77 year old male status post urgent ureteral stent placement for an obstructing right ureteral calculus presents for definitive management  Description of procedure:  The patient was identified and consent was obtained.  The patient was taken to the operating room and placed in the supine position.  The patient was placed under general anesthesia.  Perioperative antibiotics were administered.  The patient was placed in dorsal lithotomy.  Patient was prepped and draped in a standard sterile fashion and a timeout was performed.  A 21 French rigid cystoscope was advanced into the urethra and into the bladder.  Complete cystoscopy was performed with no abnormal findings.  The stent was pulled just beyond the urethral meatus and a sensor wire was advanced up to the kidney under fluoroscopic guidance.  The stent was withdrawn and a semirigid ureteroscope was advanced alongside the wire up to the stone of interest which was fragmented with a laser fiber to tiny fragments.  A retrograde pyelogram was performed through the scope.  I then withdrew the scope and there was no significant trauma or other ureteral calculi noted.  I then backloaded the wire onto a rigid cystoscope and advanced that  into the bladder followed by placement of a 6 x 26 double-J ureteral stent in a standard fashion followed by removal of the wire.  Fluoroscopy confirmed proximal placement and direct visualization confirmed a good coil within the bladder.  I then drained the bladder and withdrew the scope.  The patient tolerated the procedure well and was stable postoperatively.  Plan: Plan to remove ureteral stent in 1 week

## 2018-03-16 NOTE — Interval H&P Note (Signed)
History and Physical Interval Note:  03/16/2018 8:20 AM  Leslie Cardenas  has presented today for surgery, with the diagnosis of right ureteral stone  The various methods of treatment have been discussed with the patient and family. After consideration of risks, benefits and other options for treatment, the patient has consented to  Procedure(s): CYSTOSCOPY RIGHT URETEROSCOPY/HOLMIUM LASER/STENT PLACEMENT (Right) as a surgical intervention .  The patient's history has been reviewed, patient examined, no change in status, stable for surgery.  I have reviewed the patient's chart and labs.  Questions were answered to the patient's satisfaction.     Marton Redwood, III

## 2018-03-16 NOTE — OR Nursing (Signed)
RIGHT URETERAL STENT REMOVED 0847 BY DR [name]

## 2018-03-16 NOTE — Anesthesia Postprocedure Evaluation (Signed)
Anesthesia Post Note  Patient: Leslie Cardenas  Procedure(s) Performed: CYSTOSCOPY RIGHT URETEROSCOPY/HOLMIUM LASER/STENT PLACEMENT (Right )     Patient location during evaluation: PACU Anesthesia Type: General Level of consciousness: awake and alert and oriented Pain management: pain level controlled Vital Signs Assessment: post-procedure vital signs reviewed and stable Respiratory status: spontaneous breathing, nonlabored ventilation and respiratory function stable Cardiovascular status: blood pressure returned to baseline and stable Postop Assessment: no apparent nausea or vomiting Anesthetic complications: no    Last Vitals:  Vitals:   03/16/18 0907 03/16/18 0930  BP: (!) 156/87 139/84  Pulse:  63  Resp: (!) 9 13  Temp: (!) 36.4 C   SpO2: 100% 100%    Last Pain:  Vitals:   03/16/18 0930  TempSrc:   PainSc: 0-No pain                 Erynn Vaca A.

## 2018-03-16 NOTE — Anesthesia Procedure Notes (Signed)
Procedure Name: LMA Insertion Date/Time: 03/16/2018 8:35 AM Performed by: Josephine Igo, MD Pre-anesthesia Checklist: Patient identified, Emergency Drugs available, Suction available and Patient being monitored Patient Re-evaluated:Patient Re-evaluated prior to induction Oxygen Delivery Method: Circle system utilized Preoxygenation: Pre-oxygenation with 100% oxygen Induction Type: IV induction Ventilation: Mask ventilation without difficulty LMA: LMA inserted LMA Size: 4.0 Number of attempts: 1 Airway Equipment and Method: Bite block Placement Confirmation: positive ETCO2 Tube secured with: Tape Dental Injury: Teeth and Oropharynx as per pre-operative assessment

## 2018-03-16 NOTE — Discharge Instructions (Addendum)
[facility] °[phone] °Post Ureteroscopy With or Without Stent Instructions ° °Definitions: ° °Ureter: The duct that transports urine from the kidney to the bladder. °Stent:   A plastic hollow tube that is placed into the ureter, from the kidney to the                 bladder to prevent the ureter from swelling shut. ° °GENERAL INSTRUCTIONS: ° °Despite the fact that no skin incisions were used, the area around the ureter and bladder is raw and irritated. The stent is a foreign body which will further irritate the bladder wall. This irritation is manifested by increased frequency of urination, both day and night, and by an increase in the urge to urinate. In some, the urge to urinate is present almost always. Sometimes the urge is strong enough that you may not be able to stop yourself from urinating. The only real cure is to remove the stent and then give time for the bladder wall to heal which can't be done until the danger of the ureter swelling shut has passed, which varies. ° °You may see some blood in your urine while the stent is in place and a few days afterwards. Do not be alarmed, even if the urine was clear for a while. Get off your feet and drink lots of fluids until clearing occurs. If you start to pass clots or don't improve, call us. ° °DIET: °You may return to your normal diet immediately. Because of the raw surface of your bladder, alcohol, spicy foods, acid type foods and drinks with caffeine may cause irritation or frequency and should be used in moderation. To keep your urine flowing freely and to avoid constipation, drink plenty of fluids during the day ( 8-10 glasses ). °Tip: Avoid cranberry juice because it is very acidic. ° °ACTIVITY: °Your physical activity doesn't need to be restricted. However, if you are very active, you may see some blood in your urine. We suggest that you reduce your activity under these circumstances until the bleeding has stopped. ° °BOWELS: °It is  important to keep your bowels regular during the postoperative period. Straining with bowel movements can cause bleeding. A bowel movement every other day is reasonable. Use a mild laxative if needed, such as Milk of Magnesia 2-3 tablespoons, or 2 Dulcolax tablets. Call if you continue to have problems. If you have been taking narcotics for pain, before, during or after your surgery, you may be constipated. Take a laxative if necessary. ° ° °MEDICATION: °You should resume your pre-surgery medications unless told not to. °You may take oxybutynin or flomax if prescribed for bladder spasms or discomfort from the stent °Take pain medication as directed for pain refractory to conservative management ° °PROBLEMS YOU SHOULD REPORT TO US: °· Fevers over 100.5 Fahrenheit. °· Heavy bleeding, or clots ( See above notes about blood in urine ). °· Inability to urinate. °· Drug reactions ( hives, rash, nausea, vomiting, diarrhea ). °· Severe burning or pain with urination that is not improving. ° ° °Post Anesthesia Home Care Instructions ° °Activity: °Get plenty of rest for the remainder of the day. A responsible individual must stay with you for 24 hours following the procedure.  °For the next 24 hours, DO NOT: °-Drive a car °-Operate machinery °-Drink alcoholic beverages °-Take any medication unless instructed by your physician °-Make any legal decisions or sign important papers. ° °Meals: °Start with liquid foods such as gelatin or soup. Progress to regular foods   as tolerated. Avoid greasy, spicy, heavy foods. If nausea and/or vomiting occur, drink only clear liquids until the nausea and/or vomiting subsides. Call your physician if vomiting continues. ° °Special Instructions/Symptoms: °Your throat may feel dry or sore from the anesthesia or the breathing tube placed in your throat during surgery. If this causes discomfort, gargle with warm salt water. The discomfort should disappear within 24 hours. ° °If you had a scopolamine  patch placed behind your ear for the management of post- operative nausea and/or vomiting: ° °1. The medication in the patch is effective for 72 hours, after which it should be removed.  Wrap patch in a tissue and discard in the trash. Wash hands thoroughly with soap and water. °2. You may remove the patch earlier than 72 hours if you experience unpleasant side effects which may include dry mouth, dizziness or visual disturbances. °3. Avoid touching the patch. Wash your hands with soap and water after contact with the patch. °  ° ° °

## 2018-03-17 ENCOUNTER — Encounter (HOSPITAL_BASED_OUTPATIENT_CLINIC_OR_DEPARTMENT_OTHER): Payer: Self-pay | Admitting: Urology

## 2018-03-18 DIAGNOSIS — Z9713 Presence of artificial right leg (complete) (partial): Secondary | ICD-10-CM | POA: Diagnosis not present

## 2018-03-18 DIAGNOSIS — Z809 Family history of malignant neoplasm, unspecified: Secondary | ICD-10-CM | POA: Diagnosis not present

## 2018-03-18 DIAGNOSIS — Z87891 Personal history of nicotine dependence: Secondary | ICD-10-CM | POA: Diagnosis not present

## 2018-03-18 DIAGNOSIS — K219 Gastro-esophageal reflux disease without esophagitis: Secondary | ICD-10-CM | POA: Diagnosis not present

## 2018-03-18 DIAGNOSIS — J449 Chronic obstructive pulmonary disease, unspecified: Secondary | ICD-10-CM | POA: Diagnosis not present

## 2018-03-18 DIAGNOSIS — I252 Old myocardial infarction: Secondary | ICD-10-CM | POA: Diagnosis not present

## 2018-03-18 DIAGNOSIS — Z89511 Acquired absence of right leg below knee: Secondary | ICD-10-CM | POA: Diagnosis not present

## 2018-03-25 DIAGNOSIS — N201 Calculus of ureter: Secondary | ICD-10-CM | POA: Diagnosis not present

## 2018-04-26 ENCOUNTER — Encounter: Payer: Self-pay | Admitting: Internal Medicine

## 2018-04-26 NOTE — Patient Instructions (Signed)

## 2018-04-26 NOTE — Progress Notes (Signed)
This very nice 77 y.o. MWM who has been lost to fllow -up elsewhere returns after a 3 year absence to re-establish care at this office for follow up with HTN, HLD, COPD, Pre-Diabetes and Vitamin D Deficiency.Patient's COPD seems compensated on prn albuterol by MDI's & Neb's and low dose Prednisone.   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      Patient had recent EGD in Sept to evaluate Dysphagia finding a tortuous esophagus with a mild stricture which was dilated by Dr Loletha Carrow and sx's have resolved likewise on Omeprazole.     Patient is treated for HTN & BP has been controlled at home. Today's BP is at goal - 136/78.  Patient has hx/o MI in [birth year] (age 42)  and in 2013 had a negative Lexiscan Myoview. Patient has had no complaints of any cardiac type chest pain, palpitations, dyspnea / orthopnea / PND, dizziness, claudication, or dependent edema.     Hyperlipidemia is controlled with diet & meds. Patient denies myalgias or other med SE's. Last Lipids were not at goal: Lab Results  Component Value Date   CHOL 209 (H) 01/11/2017   HDL 44 01/11/2017   LDLCALC 130 (H) 01/11/2017   TRIG 173 (H) 01/11/2017   CHOLHDL 4.8 01/11/2017      Also, the patient has history of Prediabetes (A1c 5.9% / 2011 and 6.1% / 2015)  W/CKD3 (GFR 37) and he has had no symptoms of reactive hypoglycemia, diabetic polys, paresthesias or visual blurring.  Last A1c was not at goal: Lab Results  Component Value Date   HGBA1C 6.4 (H) 07/25/2014      Further, the patient also has history of Vitamin D Deficiency ("24" / 2008)  and supplements vitamin D without any suspected side-effects. Last vitamin D was still very low:  Lab Results  Component Value Date   VD25OH 33 02/15/2018   Current Outpatient Medications on File Prior to Visit  Medication Sig  . albuterol (PROVENTIL HFA;VENTOLIN HFA) 108 (90 Base) MCG/ACT inhaler Inhale 2 puffs into the lungs every 6 (six) hours as needed for wheezing or shortness  of breath.  Marland Kitchen albuterol (PROVENTIL) (2.5 MG/3ML) 0.083% nebulizer solution Take 3 mLs (2.5 mg total) by nebulization every 4 (four) hours as needed for wheezing or shortness of breath.  . Cholecalciferol (VITAMIN D) 2000 units CAPS Take 1,000 Units by mouth daily.  . nitroGLYCERIN (NITROSTAT) 0.4 MG SL tablet Place 1 tablet (0.4 mg total) under the tongue every 5 (five) minutes x 3 doses as needed for chest pain.  Marland Kitchen omeprazole (PRILOSEC) 40 MG capsule Take 1 capsule (40 mg total) by mouth daily before breakfast.  . Respiratory Therapy Supplies (NEBULIZER/TUBING/MOUTHPIECE) KIT Disp one nebulizer machine, tubing set and mouthpiece kit   No current facility-administered medications on file prior to visit.    No Known Allergies   PMHx:   Past Medical History:  Diagnosis Date  . Aortic atherosclerosis ([place]) 03/04/2018   Noted on CT Abd/pelvis  . Cataract    Bilateral  . Colon polyp   . COPD (chronic obstructive pulmonary disease) ([place])   . Coronary artery disease   . Diverticulosis 07/16/2016   SIGMOID, NOTED ON COLONOSCOPY  . Esophageal stenosis 02/24/2018   noted on endoscopy  . Esophageal stricture   . GERD (gastroesophageal reflux disease)   . Grade I diastolic dysfunction 57/06/7791   noted on ECHO  . History of hiatal hernia 02/24/2018   Small  noted on endoscopy  . Hydronephrosis   . Hydronephrosis of right kidney 03/04/2018   URETERAL STONE 7 MM, NOTED ONJ CT ABD/PELVIS  . Hyperlipidemia   . Myocardial infarction ([place])    [id number]  . Nephrolithiasis    s/p stent placement  . Skin cancer   . 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 Ureteral stone 03/04/2018   URETERAL STONE 7 MM. NOTED ON CT ABD/PELVIS  . Vitamin D deficiency   . Wears dentures    upper   Immunization History  Administered Date(s) Administered  . Influenza Split 04/08/2012  . Influenza, High Dose Seasonal PF 03/04/2017  . Influenza,inj,Quad PF,6+ Mos 05/16/2015, 02/15/2018  . Pneumococcal  Conjugate-13 07/25/2014  . Pneumococcal Polysaccharide-23 04/08/2012  . Pneumococcal-Unspecified 06/06/2002  . Td 06/30/2011  . Zoster 05/22/2008   Past Surgical History:  Procedure Laterality Date  . CARDIAC CATHETERIZATION  1983   No intervention performed.   Marland Kitchen CATARACT EXTRACTION, BILATERAL    . COLONOSCOPY    . COLONOSCOPY W/ POLYPECTOMY  08/12/2010   Dr. Silvano Rusk  . COLONOSCOPY W/ POLYPECTOMY  07/16/2016  . CYSTOSCOPY W/ URETERAL STENT PLACEMENT Right 03/04/2018   Procedure: CYSTOSCOPY WITH RETROGRADE PYELOGRAM/URETERAL STENT PLACEMENT;  Surgeon: [name], MD;  Location: [place];  Service: Urology;  Laterality: Right;  . CYSTOSCOPY/URETEROSCOPY/HOLMIUM LASER/STENT PLACEMENT Right 03/16/2018   Procedure: CYSTOSCOPY RIGHT URETEROSCOPY/HOLMIUM LASER/STENT PLACEMENT;  Surgeon: [name], MD;  Location[facility];  Service: Urology;  Laterality: Right;  . ESOPHAGOGASTRODUODENOSCOPY    . INGUINAL HERNIA REPAIR     left  . right leg amputation  05/14/1977  . SHOULDER ARTHROSCOPY Left 2000  . UPPER GASTROINTESTINAL ENDOSCOPY  WITH DILATION  02/24/2018   FHx:    Reviewed / unchanged  SHx:    Reviewed / unchanged   Systems Review:  Constitutional: Denies fever, chills, wt changes, headaches, insomnia, fatigue, night sweats, change in appetite. Eyes: Denies redness, blurred vision, diplopia, discharge, itchy, watery eyes.  ENT: Denies discharge, congestion, post nasal drip, epistaxis, sore throat, earache, hearing loss, dental pain, tinnitus, vertigo, sinus pain, snoring.  CV: Denies chest pain, palpitations, irregular heartbeat, syncope, dyspnea, diaphoresis, orthopnea, PND, claudication or edema. Respiratory: denies cough, dyspnea, DOE, pleurisy, hoarseness, laryngitis, wheezing.  Gastrointestinal: Denies dysphagia, odynophagia, heartburn, reflux, water brash, abdominal pain or cramps, nausea, vomiting, bloating, diarrhea, constipation, hematemesis,  melena, hematochezia  or hemorrhoids. Genitourinary: Denies dysuria, frequency, urgency, nocturia, hesitancy, discharge, hematuria or flank pain. Musculoskeletal: Denies arthralgias, myalgias, stiffness, jt. swelling, pain, limping or strain/sprain.  Skin: Denies pruritus, rash, hives, warts, acne, eczema or change in skin lesion(s). Neuro: No weakness, tremor, incoordination, spasms, paresthesia or pain. Psychiatric: Denies confusion, memory loss or sensory loss. Endo: Denies change in weight, skin or hair change.  Heme/Lymph: No excessive bleeding, bruising or enlarged lymph nodes.  Physical Exam  BP 136/78   Pulse (!) 56   Temp (!) 97.5 F (36.4 C)   Resp 18   Ht 5' 10"  (1.778 m)   Wt 135 lb 3.2 oz (61.3 kg)   BMI 19.40 kg/m   Appears  well nourished, well groomed  and in no distress.  Eyes: PERRLA, EOMs, conjunctiva no swelling or erythema. Sinuses: No frontal/maxillary tenderness ENT/Mouth: Macrotia. EAC's clear, TM's nl w/o erythema, bulging. Nares clear w/o erythema, swelling, exudates. Oropharynx clear without erythema or exudates. Oral hygiene is good. Tongue normal, non obstructing. Hearing intact.  Neck: Supple. Thyroid not palpable. Car 2+/2+ without bruits,  nodes or JVD. Chest: Sl barrel chested with increased AP diameter. Respirations decreased with BS clear & equal w/o rales, rhonchi, wheezing or stridor.  Cor: Heart sounds normal w/ regular rate and rhythm without sig. murmurs, gallops, clicks or rubs. Peripheral pulses normal and equal  without edema.  Abdomen: Soft & bowel sounds normal. Non-tender w/o guarding, rebound, hernias, masses or organomegaly.  Lymphatics: Unremarkable.  Musculoskeletal: Rt BKA with otherwise full ROM all peripheral extremities and normal gait.  Skin: Warm, dry without exposed rashes, lesions or ecchymosis apparent.  Neuro: Cranial nerves intact, reflexes equal bilaterally. Sensory-motor testing grossly intact. Tendon reflexes grossly  intact.  Pysch: Alert & oriented x 3.  Insight and judgement nl & appropriate. No ideations.  Assessment and Plan:  1. Essential hypertension  - Continue medication, monitor blood pressure at home.  - Continue DASH diet.  Reminder to go to the ER if any CP,  SOB, nausea, dizziness, severe HA, changes vision/speech.  - CBC with Differential/Platelet - COMPLETE METABOLIC PANEL WITH GFR - Magnesium  2. Hyperlipidemia, mixed  - Continue diet/meds, exercise,& lifestyle modifications.  - Continue monitor periodic cholesterol/liver & renal functions   - Lipid panel  3. Abnormal glucose  - Continue diet, exercise,  - lifestyle modifications.  - Monitor appropriate labs.  - Hemoglobin A1c - Insulin, random  4. Vitamin D deficiency  - Continue supplementation.   5. Prediabetes  - Hemoglobin A1c - Insulin, random  6. CKD (chronic kidney disease) stage 3, GFR 30-59 ml/min (HCC)   7. Chronic obstructive pulmonary disease (HCC)  - predniSONE  5 MG tablet; Take 1 tablet 2 x /day or as directed  Dispense: 200 tablet; Refill: 1  8. Medication management - CBC with Differential/Platelet - COMPLETE METABOLIC PANEL WITH GFR - Magnesium - Lipid panel - Hemoglobin A1c - Insulin, random       Discussed  regular exercise, BP monitoring, weight control to achieve/maintain BMI less than 25 and discussed med and SE's. Recommended labs to assess and monitor clinical status with further disposition pending results of labs. Over 45 minutes of exam, counseling, chart review was performed.

## 2018-04-27 ENCOUNTER — Ambulatory Visit (INDEPENDENT_AMBULATORY_CARE_PROVIDER_SITE_OTHER): Payer: Medicare HMO | Admitting: Internal Medicine

## 2018-04-27 ENCOUNTER — Encounter: Payer: Self-pay | Admitting: Internal Medicine

## 2018-04-27 VITALS — BP 136/78 | HR 56 | Temp 97.5°F | Resp 18 | Ht 70.0 in | Wt 135.2 lb

## 2018-04-27 DIAGNOSIS — E559 Vitamin D deficiency, unspecified: Secondary | ICD-10-CM

## 2018-04-27 DIAGNOSIS — I10 Essential (primary) hypertension: Secondary | ICD-10-CM

## 2018-04-27 DIAGNOSIS — N183 Chronic kidney disease, stage 3 unspecified: Secondary | ICD-10-CM

## 2018-04-27 DIAGNOSIS — R7309 Other abnormal glucose: Secondary | ICD-10-CM | POA: Diagnosis not present

## 2018-04-27 DIAGNOSIS — J449 Chronic obstructive pulmonary disease, unspecified: Secondary | ICD-10-CM

## 2018-04-27 DIAGNOSIS — Z89511 Acquired absence of right leg below knee: Secondary | ICD-10-CM

## 2018-04-27 DIAGNOSIS — N182 Chronic kidney disease, stage 2 (mild): Secondary | ICD-10-CM | POA: Insufficient documentation

## 2018-04-27 DIAGNOSIS — E782 Mixed hyperlipidemia: Secondary | ICD-10-CM

## 2018-04-27 DIAGNOSIS — Z8249 Family history of ischemic heart disease and other diseases of the circulatory system: Secondary | ICD-10-CM | POA: Insufficient documentation

## 2018-04-27 DIAGNOSIS — R7303 Prediabetes: Secondary | ICD-10-CM | POA: Diagnosis not present

## 2018-04-27 DIAGNOSIS — Z79899 Other long term (current) drug therapy: Secondary | ICD-10-CM | POA: Diagnosis not present

## 2018-04-27 DIAGNOSIS — E1122 Type 2 diabetes mellitus with diabetic chronic kidney disease: Secondary | ICD-10-CM | POA: Diagnosis not present

## 2018-04-27 MED ORDER — PREDNISONE 5 MG PO TABS: ORAL_TABLET | ORAL | 1 refills | Status: DC

## 2018-04-28 LAB — COMPLETE METABOLIC PANEL WITH GFR
AG RATIO: 1.8 (calc) (ref 1.0–2.5)
ALKALINE PHOSPHATASE (APISO): 68 U/L (ref 40–115)
ALT: 19 U/L (ref 9–46)
AST: 23 U/L (ref 10–35)
Albumin: 4.2 g/dL (ref 3.6–5.1)
BUN: 13 mg/dL (ref 7–25)
CALCIUM: 9.6 mg/dL (ref 8.6–10.3)
CHLORIDE: 103 mmol/L (ref 98–110)
CO2: 28 mmol/L (ref 20–32)
Creat: 0.99 mg/dL (ref 0.70–1.18)
GFR, EST NON AFRICAN AMERICAN: 73 mL/min/{1.73_m2} (ref >=60)
GFR, Est African American: 84 mL/min/{1.73_m2} (ref >=60)
GLOBULIN: 2.3 g/dL (ref 1.9–3.7)
Glucose, Bld: 77 mg/dL (ref 65–99)
POTASSIUM: 4.4 mmol/L (ref 3.5–5.3)
SODIUM: 138 mmol/L (ref 135–146)
Total Bilirubin: 0.5 mg/dL (ref 0.2–1.2)
Total Protein: 6.5 g/dL (ref 6.1–8.1)

## 2018-04-28 LAB — CBC WITH DIFFERENTIAL/PLATELET
BASOS ABS: 90 {cells}/uL (ref 0–200)
Basophils Relative: 1.5 %
EOS PCT: 8.4 %
Eosinophils Absolute: 504 cells/uL — ABNORMAL HIGH (ref 15–500)
HCT: 40.8 % (ref 38.5–50.0)
Hemoglobin: 14.1 g/dL (ref 13.2–17.1)
Lymphs Abs: 822 cells/uL — ABNORMAL LOW (ref 850–3900)
MCH: 32.9 pg (ref 27.0–33.0)
MCHC: 34.6 g/dL (ref 32.0–36.0)
MCV: 95.3 fL (ref 80.0–100.0)
MPV: 9 fL (ref 7.5–12.5)
Monocytes Relative: 10.9 %
NEUTROS PCT: 65.5 %
Neutro Abs: 3930 cells/uL (ref 1500–7800)
Platelets: 334 10*3/uL (ref 140–400)
RBC: 4.28 10*6/uL (ref 4.20–5.80)
RDW: 13.2 % (ref 11.0–15.0)
Total Lymphocyte: 13.7 %
WBC mixed population: 654 cells/uL (ref 200–950)
WBC: 6 10*3/uL (ref 3.8–10.8)

## 2018-04-28 LAB — MAGNESIUM: MAGNESIUM: 1.7 mg/dL (ref 1.5–2.5)

## 2018-04-28 LAB — LIPID PANEL
CHOL/HDL RATIO: 5.5 (calc) — AB (ref <5.0)
CHOLESTEROL: 210 mg/dL — AB (ref <200)
HDL: 38 mg/dL — ABNORMAL LOW (ref >40)
LDL Cholesterol (Calc): 138 mg/dL (calc) — ABNORMAL HIGH
Non-HDL Cholesterol (Calc): 172 mg/dL (calc) — ABNORMAL HIGH (ref <130)
Triglycerides: 206 mg/dL — ABNORMAL HIGH (ref <150)

## 2018-04-28 LAB — HEMOGLOBIN A1C
EAG (MMOL/L): 6.5 (calc)
Hgb A1c MFr Bld: 5.7 % of total Hgb — ABNORMAL HIGH (ref <5.7)
Mean Plasma Glucose: 117 (calc)

## 2018-04-28 LAB — INSULIN, RANDOM: INSULIN: 1.2 u[IU]/mL — AB (ref 2.0–19.6)

## 2018-04-29 DIAGNOSIS — N200 Calculus of kidney: Secondary | ICD-10-CM | POA: Diagnosis not present

## 2018-05-26 ENCOUNTER — Other Ambulatory Visit: Payer: Self-pay | Admitting: *Deleted

## 2018-05-26 DIAGNOSIS — R69 Illness, unspecified: Secondary | ICD-10-CM | POA: Diagnosis not present

## 2018-05-26 MED ORDER — ALBUTEROL SULFATE (2.5 MG/3ML) 0.083% IN NEBU: 2.5000 mg | INHALATION_SOLUTION | RESPIRATORY_TRACT | 1 refills | Status: DC | PRN

## 2018-07-29 ENCOUNTER — Encounter: Payer: Self-pay | Admitting: Adult Health Nurse Practitioner

## 2018-07-29 ENCOUNTER — Ambulatory Visit
Admission: RE | Admit: 2018-07-29 | Discharge: 2018-07-29 | Disposition: A | Discharge status: Home / Self Care | Payer: Medicare HMO | Source: Ambulatory Visit | Attending: Adult Health Nurse Practitioner | Admitting: Adult Health Nurse Practitioner

## 2018-07-29 ENCOUNTER — Ambulatory Visit: Payer: Self-pay | Admitting: Adult Health

## 2018-07-29 ENCOUNTER — Ambulatory Visit (INDEPENDENT_AMBULATORY_CARE_PROVIDER_SITE_OTHER): Payer: Medicare HMO | Admitting: Adult Health Nurse Practitioner

## 2018-07-29 VITALS — BP 122/72 | HR 76 | Temp 97.7°F | Ht 70.0 in | Wt 140.0 lb

## 2018-07-29 DIAGNOSIS — Z298 Encounter for other specified prophylactic measures: Secondary | ICD-10-CM | POA: Diagnosis not present

## 2018-07-29 DIAGNOSIS — Z79899 Other long term (current) drug therapy: Secondary | ICD-10-CM

## 2018-07-29 DIAGNOSIS — R0602 Shortness of breath: Secondary | ICD-10-CM

## 2018-07-29 DIAGNOSIS — E782 Mixed hyperlipidemia: Secondary | ICD-10-CM | POA: Diagnosis not present

## 2018-07-29 DIAGNOSIS — J449 Chronic obstructive pulmonary disease, unspecified: Secondary | ICD-10-CM | POA: Diagnosis not present

## 2018-07-29 DIAGNOSIS — R7303 Prediabetes: Secondary | ICD-10-CM

## 2018-07-29 DIAGNOSIS — Z0001 Encounter for general adult medical examination with abnormal findings: Secondary | ICD-10-CM

## 2018-07-29 DIAGNOSIS — R6889 Other general symptoms and signs: Secondary | ICD-10-CM | POA: Diagnosis not present

## 2018-07-29 DIAGNOSIS — Z2989 Encounter for other specified prophylactic measures: Secondary | ICD-10-CM

## 2018-07-29 DIAGNOSIS — K219 Gastro-esophageal reflux disease without esophagitis: Secondary | ICD-10-CM

## 2018-07-29 DIAGNOSIS — J439 Emphysema, unspecified: Secondary | ICD-10-CM | POA: Diagnosis not present

## 2018-07-29 DIAGNOSIS — Z0000 Encounter for general adult medical examination without abnormal findings: Secondary | ICD-10-CM

## 2018-07-29 DIAGNOSIS — R69 Illness, unspecified: Secondary | ICD-10-CM | POA: Diagnosis not present

## 2018-07-29 DIAGNOSIS — N183 Chronic kidney disease, stage 3 unspecified: Secondary | ICD-10-CM

## 2018-07-29 DIAGNOSIS — I10 Essential (primary) hypertension: Secondary | ICD-10-CM

## 2018-07-29 DIAGNOSIS — J441 Chronic obstructive pulmonary disease with (acute) exacerbation: Secondary | ICD-10-CM

## 2018-07-29 MED ORDER — CALCIUM CARBONATE 600 MG PO TABS: 600.0000 mg | ORAL_TABLET | Freq: Two times a day (BID) | ORAL | 3 refills | Status: DC

## 2018-07-29 MED ORDER — ALBUTEROL SULFATE (2.5 MG/3ML) 0.083% IN NEBU: 2.5000 mg | INHALATION_SOLUTION | RESPIRATORY_TRACT | 1 refills | Status: DC | PRN

## 2018-07-29 MED ORDER — PREDNISONE 10 MG (21) PO TBPK: ORAL_TABLET | ORAL | 0 refills | Status: DC

## 2018-07-29 NOTE — Patient Instructions (Addendum)
Take your symbicort, 2 puff twice a day.  Rinse your mouth with water after use.  Also take Mucinex every 12 hours.  Go to [facility] for your chest x-ray.  We will contact you with results today.  You may need further treatment after getting these results.   We will send in calcium to take daily, take with food.  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     Leslie Cardenas , Thank you for taking time to come for your Medicare Wellness Visit. I appreciate your ongoing commitment to your health goals. Please review the following plan we discussed and let me know if I can assist you in the future.   You are due for an eye exam and dental exam.  All other health screening are up to date.    This is a list of the screening recommended for you and due dates:  Health Maintenance  Topic Date Due  . Tetanus Vaccine  06/29/2021  . Flu Shot  Completed  . Pneumonia vaccines  Completed       We Do NOT Approve of  [facility], [employer]  Our Patients  To Do Home Visits  & We Do NOT Approve of [facility] > > > > > > > > > > > > > > > > > > > > > > > > > > > > > > > > > > >  > > > >   Preventive Care for Adults  A healthy lifestyle and preventive care can promote health and wellness. Preventive health guidelines for men include the following key practices:  A routine yearly physical is a good way to check with your health care provider about your health and preventative screening. It is a chance to share any concerns and updates on your health and to receive a thorough exam.  Visit your dentist for a routine exam and preventative care every 6 months. Brush your teeth twice a day and floss once a day. Good oral hygiene prevents tooth decay and gum disease.  The frequency of eye exams is based on your age, health, family medical history, use of contact lenses, and other factors. Follow your health care  provider's recommendations for frequency of eye exams.  Eat a healthy diet. Foods such as vegetables, fruits, whole grains, low-fat dairy products, and lean protein foods contain the nutrients you need without too many calories. Decrease your intake of foods high in solid fats, added sugars, and salt. Eat the right amount of calories for you. Get information about a proper diet from your health care provider, if necessary.  Regular physical exercise is one of the most important things you can do for your health. Most adults should get at least 150 minutes of moderate-intensity exercise (any activity that increases your heart rate and causes you to sweat) each week. In addition, most adults need muscle-strengthening exercises on 2 or more days a week.  Maintain a healthy weight. The body mass index (BMI) is a screening tool to identify possible weight problems. It provides an estimate of body fat based on height and weight. Your health care provider can find your BMI and can help you achieve or maintain a healthy weight. For adults 20 years and older:  A BMI below 18.5 is considered underweight.  A BMI of 18.5 to 24.9 is normal.  A BMI of 25 to 29.9 is considered overweight.  A  BMI of 30 and above is considered obese.  Maintain normal blood lipids and cholesterol levels by exercising and minimizing your intake of saturated fat. Eat a balanced diet with plenty of fruit and vegetables. Blood tests for lipids and cholesterol should begin at age 88 and be repeated every 5 years. If your lipid or cholesterol levels are high, you are over 50, or you are at high risk for heart disease, you may need your cholesterol levels checked more frequently. Ongoing high lipid and cholesterol levels should be treated with medicines if diet and exercise are not working.  If you smoke, find out from your health care provider how to quit. If you do not use tobacco, do not start.  Lung cancer screening is recommended  for adults aged 58-80 years who are at high risk for developing lung cancer because of a history of smoking. A yearly low-dose CT scan of the lungs is recommended for people who have at least a 30-pack-year history of smoking and are a current smoker or have quit within the past 15 years. A pack year of smoking is smoking an average of 1 pack of cigarettes a day for 1 year (for example: 1 pack a day for 30 years or 2 packs a day for 15 years). Yearly screening should continue until the smoker has stopped smoking for at least 15 years. Yearly screening should be stopped for people who develop a health problem that would prevent them from having lung cancer treatment.  If you choose to drink alcohol, do not have more than 2 drinks per day. One drink is considered to be 12 ounces (355 mL) of beer, 5 ounces (148 mL) of wine, or 1.5 ounces (44 mL) of liquor.  Avoid use of street drugs. Do not share needles with anyone. Ask for help if you need support or instructions about stopping the use of drugs.  High blood pressure causes heart disease and increases the risk of stroke. Your blood pressure should be checked at least every 1-2 years. Ongoing high blood pressure should be treated with medicines, if weight loss and exercise are not effective.  If you are 53-47 years old, ask your health care provider if you should take aspirin to prevent heart disease.  Diabetes screening involves taking a blood sample to check your fasting blood sugar level. Testing should be considered at a younger age or be carried out more frequently if you are overweight and have at least 1 risk factor for diabetes.  Colorectal cancer can be detected and often prevented. Most routine colorectal cancer screening begins at the age of 44 and continues through age 52. However, your health care provider may recommend screening at an earlier age if you have risk factors for colon cancer. On a yearly basis, your health care provider may  provide home test kits to check for hidden blood in the stool. Use of a small camera at the end of a tube to directly examine the colon (sigmoidoscopy or colonoscopy) can detect the earliest forms of colorectal cancer. Talk to your health care provider about this at age 54, when routine screening begins. Direct exam of the colon should be repeated every 5-10 years through age 16, unless early forms of precancerous polyps or small growths are found.  Hepatitis C blood testing is recommended for all people born from [birth year] through 1966 and any individual with known risks for hepatitis C.  Screening for abdominal aortic aneurysm (AAA)  by ultrasound is recommended for people  who have history of high blood pressure or who are current or former smokers.  Healthy men should  receive prostate-specific antigen (PSA) blood tests as part of routine cancer screening. Talk with your health care provider about prostate cancer screening.  Testicular cancer screening is  recommended for adult males. Screening includes self-exam, a health care provider exam, and other screening tests. Consult with your health care provider about any symptoms you have or any concerns you have about testicular cancer.  Use sunscreen. Apply sunscreen liberally and repeatedly throughout the day. You should seek shade when your shadow is shorter than you. Protect yourself by wearing long sleeves, pants, a wide-brimmed hat, and sunglasses year round, whenever you are outdoors.  Once a month, do a whole-body skin exam, using a mirror to look at the skin on your back. Tell your health care provider about new moles, moles that have irregular borders, moles that are larger than a pencil eraser, or moles that have changed in shape or color.  Stay current with required vaccines (immunizations).  Influenza vaccine. All adults should be immunized every year.  Tetanus, diphtheria, and acellular pertussis (Td, Tdap) vaccine. An adult who has not  previously received Tdap or who does not know his vaccine status should receive 1 dose of Tdap. This initial dose should be followed by tetanus and diphtheria toxoids (Td) booster doses every 10 years. Adults with an unknown or incomplete history of completing a 3-dose immunization series with Td-containing vaccines should begin or complete a primary immunization series including a Tdap dose. Adults should receive a Td booster every 10 years.  Zoster vaccine. One dose is recommended for adults aged 48 years or older unless certain conditions are present.    PREVNAR - Pneumococcal 13-valent conjugate (PCV13) vaccine. When indicated, a person who is uncertain of his immunization history and has no record of immunization should receive the PCV13 vaccine. An adult aged 2 years or older who has certain medical conditions and has not been previously immunized should receive 1 dose of PCV13 vaccine. This PCV13 should be followed with a dose of pneumococcal polysaccharide (PPSV23) vaccine. The PPSV23 vaccine dose should be obtained 1 or more year(s)after the dose of PCV13 vaccine. An adult aged 9 years or older who has certain medical conditions and previously received 1 or more doses of PPSV23 vaccine should receive 1 dose of PCV13. The PCV13 vaccine dose should be obtained 1 or more years after the last PPSV23 vaccine dose.    PNEUMOVAX - Pneumococcal polysaccharide (PPSV23) vaccine. When PCV13 is also indicated, PCV13 should be obtained first. All adults aged 52 years and older should be immunized. An adult younger than age 33 years who has certain medical conditions should be immunized. Any person who resides in a nursing home or long-term care facility should be immunized. An adult smoker should be immunized. People with an immunocompromised condition and certain other conditions should receive both PCV13 and PPSV23 vaccines. People with human immunodeficiency virus (HIV) infection should be immunized as  soon as possible after diagnosis. Immunization during chemotherapy or radiation therapy should be avoided. Routine use of PPSV23 vaccine is not recommended for American Indians, [place] Natives, or people younger than 65 years unless there are medical conditions that require PPSV23 vaccine. When indicated, people who have unknown immunization and have no record of immunization should receive PPSV23 vaccine. One-time revaccination 5 years after the first dose of PPSV23 is recommended for people aged 19-64 years who have chronic kidney failure, nephrotic  syndrome, asplenia, or immunocompromised conditions. People who received 1-2 doses of PPSV23 before age 31 years should receive another dose of PPSV23 vaccine at age 65 years or later if at least 5 years have passed since the previous dose. Doses of PPSV23 are not needed for people immunized with PPSV23 at or after age 46 years.    Hepatitis A vaccine. Adults who wish to be protected from this disease, have certain high-risk conditions, work with hepatitis A-infected animals, work in hepatitis A research labs, or travel to or work in countries with a high rate of hepatitis A should be immunized. Adults who were previously unvaccinated and who anticipate close contact with an international adoptee during the first 60 days after arrival in the [place] from a country with a high rate of hepatitis A should be immunized.    Hepatitis B vaccine. Adults should be immunized if they wish to be protected from this disease, have certain high-risk conditions, may be exposed to blood or other infectious body fluids, are household contacts or sex partners of hepatitis B positive people, are clients or workers in certain care facilities, or travel to or work in countries with a high rate of hepatitis B.   Preventive Service / Frequency   Ages 55 and over  Blood pressure check.  Lipid and cholesterol check.  Lung cancer screening. / Every year if you are aged  24-80 years and have a 30-pack-year history of smoking and currently smoke or have quit within the past 15 years. Yearly screening is stopped once you have quit smoking for at least 15 years or develop a health problem that would prevent you from having lung cancer treatment.  Fecal occult blood test (FOBT) of stool. You may not have to do this test if you get a colonoscopy every 10 years.  Flexible sigmoidoscopy** or colonoscopy.** / Every 5 years for a flexible sigmoidoscopy or every 10 years for a colonoscopy beginning at age 94 and continuing until age 82.  Hepatitis C blood test.** / For all people born from [birth year] through 1966 and any individual with known risks for hepatitis C.  Abdominal aortic aneurysm (AAA) screening./ Screening current or former smokers or have Hypertension.  Skin self-exam. / Monthly.  Influenza vaccine. / Every year.  Tetanus, diphtheria, and acellular pertussis (Tdap/Td) vaccine.** / 1 dose of Td every 10 years.   Zoster vaccine.** / 1 dose for adults aged 35 years or older.         Pneumococcal 13-valent conjugate (PCV13) vaccine.    Pneumococcal polysaccharide (PPSV23) vaccine.     Hepatitis A vaccine.** / Consult your health care provider.  Hepatitis B vaccine.** / Consult your health care provider. Screening for abdominal aortic aneurysm (AAA)  by ultrasound is recommended for people who have history of high blood pressure or who are current or former smokers. ++++++++++ Recommend Adult Low Dose Aspirin or  coated  Aspirin 81 mg daily  To reduce risk of Colon Cancer 20 %,  Skin Cancer 26 % ,  Malignant Melanoma 46%  and  Pancreatic cancer 60% ++++++++++++++++++++++ Vitamin D goal  is between 70-100.  Please make sure that you are taking your Vitamin D as directed.  It is very important as a natural anti-inflammatory  helping hair, skin, and nails, as well as reducing stroke and heart attack risk.  It helps your bones and helps with  mood. It also decreases numerous cancer risks so please take it as directed.  Low Vit D  is associated with a 200-300% higher risk for CANCER  and 200-300% higher risk for [place].   .....................................Marland Kitchen It is also associated with higher death rate at younger ages,  autoimmune diseases like Rheumatoid arthritis, Lupus, Multiple Sclerosis.    Also many other serious conditions, like depression, Alzheimer's Dementia, infertility, muscle aches, fatigue, fibromyalgia - just to name a few. ++++++++++++++++++++++ Recommend the book "The END of DIETING" by Dr Excell Seltzer  & the book "The END of DIABETES " by Dr Excell Seltzer At [email or web address] - get book & Audio CD's    Being diabetic has a  300% increased risk for heart attack, stroke, cancer, and alzheimer- type vascular dementia. It is very important that you work harder with diet by avoiding all foods that are white. Avoid white rice (brown & wild rice is OK), white potatoes (sweetpotatoes in moderation is OK), White bread or wheat bread or anything made out of white flour like bagels, donuts, rolls, buns, biscuits, cakes, pastries, cookies, pizza crust, and pasta (made from white flour & egg whites) - vegetarian pasta or spinach or wheat pasta is OK. Multigrain breads like Arnold's or Pepperidge [employer], or multigrain sandwich thins or flatbreads.  Diet, exercise and weight loss can reverse and cure diabetes in the early stages.  Diet, exercise and weight loss is very important in the control and prevention of complications of diabetes which affects every system in your body, ie. Brain - dementia/stroke, eyes - glaucoma/blindness, heart - heart attack/heart failure, kidneys - dialysis, stomach - gastric paralysis, intestines - malabsorption, nerves - severe painful neuritis, circulation - gangrene & loss of a leg(s), and finally cancer and Alzheimers.    I recommend avoid fried & greasy foods,  sweets/candy, white rice (brown  or wild rice or Quinoa is OK), white potatoes (sweet potatoes are OK) - anything made from white flour - bagels, doughnuts, rolls, buns, biscuits,white and wheat breads, pizza crust and traditional pasta made of white flour & egg white(vegetarian pasta or spinach or wheat pasta is OK).  Multi-grain bread is OK - like multi-grain flat bread or sandwich thins. Avoid alcohol in excess. Exercise is also important.    Eat all the vegetables you want - avoid meat, especially red meat and dairy - especially cheese.  Cheese is the most concentrated form of trans-fats which is the worst thing to clog up our arteries. Veggie cheese is OK which can be found in the fresh produce section at [employer] or [employer] or [employer]  ++++++++++++++++++++++ DASH Eating Plan  DASH stands for "Dietary Approaches to Stop Hypertension."   The DASH eating plan is a healthy eating plan that has been shown to reduce high blood pressure (hypertension). Additional health benefits may include reducing the risk of type 2 diabetes mellitus, heart disease, and stroke. The DASH eating plan may also help with weight loss. WHAT DO I NEED TO KNOW ABOUT THE DASH EATING PLAN? For the DASH eating plan, you will follow these general guidelines:  Choose foods with a percent daily value for sodium of less than 5% (as listed on the food label).  Use salt-free seasonings or herbs instead of table salt or sea salt.  Check with your health care provider or pharmacist before using salt substitutes.  Eat lower-sodium products, often labeled as "lower sodium" or "no salt added."  Eat fresh foods.  Eat more vegetables, fruits, and low-fat dairy products.  Choose whole grains. Look for the word "whole"  as the first word in the ingredient list.  Choose fish   Limit sweets, desserts, sugars, and sugary drinks.  Choose heart-healthy fats.  Eat veggie cheese   Eat more home-cooked food and less restaurant, buffet, and fast  food.  Limit fried foods.  Cook foods using methods other than frying.  Limit canned vegetables. If you do use them, rinse them well to decrease the sodium.  When eating at a restaurant, ask that your food be prepared with less salt, or no salt if possible.                      WHAT FOODS CAN I EAT? Read Dr Fara Olden Fuhrman's books on The End of Dieting & The End of Diabetes  Grains Whole grain or whole wheat bread. Brown rice. Whole grain or whole wheat pasta. Quinoa, bulgur, and whole grain cereals. Low-sodium cereals. Corn or whole wheat flour tortillas. Whole grain cornbread. Whole grain crackers. Low-sodium crackers.  Vegetables Fresh or frozen vegetables (raw, steamed, roasted, or grilled). Low-sodium or reduced-sodium tomato and vegetable juices. Low-sodium or reduced-sodium tomato sauce and paste. Low-sodium or reduced-sodium canned vegetables.   Fruits All fresh, canned (in natural juice), or frozen fruits.  Protein Products  All fish and seafood.  Dried beans, peas, or lentils. Unsalted nuts and seeds. Unsalted canned beans.  Dairy Low-fat dairy products, such as skim or 1% milk, 2% or reduced-fat cheeses, low-fat ricotta or cottage cheese, or plain low-fat yogurt. Low-sodium or reduced-sodium cheeses.  Fats and Oils Tub margarines without trans fats. Light or reduced-fat mayonnaise and salad dressings (reduced sodium). Avocado. Safflower, olive, or canola oils. Natural peanut or almond butter.  Other Unsalted popcorn and pretzels. The items listed above may not be a complete list of recommended foods or beverages. Contact your dietitian for more options.  ++++++++++++++++++++  WHAT FOODS ARE NOT RECOMMENDED? Grains/ White flour or wheat flour White bread. White pasta. White rice. Refined cornbread. Bagels and croissants. Crackers that contain trans fat.  Vegetables  Creamed or fried vegetables. Vegetables in a . Regular canned vegetables. Regular canned tomato sauce  and paste. Regular tomato and vegetable juices.  Fruits Dried fruits. Canned fruit in light or heavy syrup. Fruit juice.  Meat and Other Protein Products Meat in general - RED meat & White meat.  Fatty cuts of meat. Ribs, chicken wings, all processed meats as bacon, sausage, bologna, salami, fatback, hot dogs, bratwurst and packaged luncheon meats.  Dairy Whole or 2% milk, cream, half-and-half, and cream cheese. Whole-fat or sweetened yogurt. Full-fat cheeses or blue cheese. Non-dairy creamers and whipped toppings. Processed cheese, cheese spreads, or cheese curds.  Condiments Onion and garlic salt, seasoned salt, table salt, and sea salt. Canned and packaged gravies. Worcestershire sauce. Tartar sauce. Barbecue sauce. Teriyaki sauce. Soy sauce, including reduced sodium. Steak sauce. Fish sauce. Oyster sauce. Cocktail sauce. Horseradish. Ketchup and mustard. Meat flavorings and tenderizers. Bouillon cubes. Hot sauce. Tabasco sauce. Marinades. Taco seasonings. Relishes.  Fats and Oils Butter, stick margarine, lard, shortening and bacon fat. Coconut, palm kernel, or palm oils. Regular salad dressings.  Pickles and olives. Salted popcorn and pretzels.  The items listed above may not be a complete list of foods and beverages to avoid.

## 2018-07-29 NOTE — Progress Notes (Addendum)
MEDICARE ANNUAL WELLNESS VISIT AND FOLLOW UP Assessment:   Leslie Cardenas was seen today for follow-up and medicare wellness.  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  Gastroesophageal reflux disease, esophagitis presence not specified -     Magnesium  Medication management -     CBC with Differential/Platelet -     COMPLETE METABOLIC PANEL WITH GFR -     Magnesium -     Lipid panel -     TSH -     Insulin, random -     Hemoglobin A1c -     VITAMIN D 25 Hydroxy (Vit-D Deficiency, Fractures) -     Vitamin B12  Shortness of breath -     Discontinue: albuterol (PROVENTIL) (2.5 MG/3ML) 0.083% nebulizer solution; Take 3 mLs (2.5 mg total) by nebulization every 4 (four) hours as needed for wheezing or shortness of breath. -     DG Chest 2 View; Future  Osteoporosis prophylaxis -     COMPLETE METABOLIC PANEL WITH GFR -     VITAMIN D 25 Hydroxy (Vit-D Deficiency, Fractures) -     calcium carbonate (CALCIUM 600) 600 MG TABS tablet; Take 1 tablet (600 mg total) by mouth 2 (two) times daily with a meal.  COPD with acute exacerbation (HCC) -     Discontinue: albuterol (PROVENTIL) (2.5 MG/3ML) 0.083% nebulizer solution; Take 3 mLs (2.5 mg total) by nebulization every 4 (four) hours as needed for wheezing or shortness of breath. -     Discontinue: predniSONE (STERAPRED UNI-PAK 21 TAB) 10 MG (21) TBPK tablet; Follow directions on Taper pack.    Over 30 minutes of exam, counseling,  chart review, and critical decision making was performed  Future Appointments  Date Time Provider [place]  11/07/2018  9:30 AM Unk Pinto, MD [facility] None  05/22/2019 10:00 AM Unk Pinto, MD [facility] None  08/01/2019  9:00 AM Garnet Sierras, NP [facility] None     Plan:   During the course of the visit the patient was educated and counseled about appropriate screening and preventive services including:    Pneumococcal vaccine   Influenza vaccine  Prevnar 13  Td vaccine  Screening electrocardiogram  Colorectal cancer screening  Diabetes screening  Glaucoma screening  Nutrition counseling    Subjective:  Leslie Cardenas is a 78 y.o. male who presents for Medicare Annual Wellness Visit and 3 month follow up for HTN, hyperlipidemia, prediabetes, CKDIII, COPD GERD and vitamin D Def.    Reports a cough that started 3 weeks ago and he feels excessivley fatigued.  He is not taking a maintenance inhaler for his COPD.  Reports he has symbicort at home but he has not used this.  Reports he is almost out of his albuterol nebulizer treatments and he was using these almost every 4 hours.  He is wheezing with conversation.  Difficult for patient  to maintain conversation without stopping to catch his breath. His blood pressure has been controlled at home, today their BP is BP: 122/72 He does workout by means of yard and housework. He denies chest pain, shortness of breath, dizziness.  He is not on cholesterol medication and denies myalgias. His cholesterol is not at goal. The cholesterol last visit was:   Lab Results  Component Value Date   CHOL 219 (H) 07/29/2018   HDL 48 07/29/2018   LDLCALC 137 (H) 07/29/2018   TRIG 198 (H) 07/29/2018   CHOLHDL 4.6 07/29/2018   He has been working on diet and exercise for prediabetes, and denies hyperglycemia, hypoglycemia , increased appetite, nausea, polydipsia and polyuria. Last A1C in the office was:  Lab Results   Component Value Date   HGBA1C 6.1 (H) 07/29/2018   Last GFR Lab Results  Component Value Date   [facility] 66 07/29/2018     Lab Results  Component Value Date   GFRAA 77 07/29/2018   Patient is on Vitamin D supplement.   Lab Results  Component Value Date   VD25OH 38 07/29/2018      Medication Review:  Current Outpatient Medications (Endocrine & Metabolic):  .  predniSONE (DELTASONE) 5 MG tablet, Take 1 tablet 2 x /day or as directed .  predniSONE (STERAPRED UNI-PAK 21 TAB) 10 MG (21) TBPK tablet, Follow directions on Taper pack.  Current Outpatient Medications (Cardiovascular):  .  nitroGLYCERIN (NITROSTAT) 0.4 MG SL tablet, Place 1 tablet (0.4 mg total) under the tongue every 5 (five) minutes x 3 doses as needed for chest pain.  Current Outpatient Medications (Respiratory):  .  albuterol (PROVENTIL HFA;VENTOLIN HFA) 108 (90 Base) MCG/ACT inhaler, Inhale 2 puffs into the lungs every 6 (six) hours as needed for wheezing or shortness of breath. Marland Kitchen  albuterol (PROVENTIL) (2.5 MG/3ML) 0.083% nebulizer solution, Take 3 mLs (2.5 mg total) by nebulization every 4 (four) hours as needed for wheezing or shortness of breath.    Current Outpatient Medications (Other):  Marland Kitchen  Cholecalciferol (VITAMIN D) 2000 units CAPS, Take 1,000 Units by mouth daily. Marland Kitchen  omeprazole (PRILOSEC) 40 MG capsule, Take 1 capsule (40 mg total) by mouth daily before breakfast. .  Respiratory Therapy Supplies (NEBULIZER/TUBING/MOUTHPIECE) KIT, Disp one nebulizer machine, tubing set and mouthpiece kit .  calcium carbonate (CALCIUM 600) 600 MG TABS tablet, Take 1 tablet (600 mg total) by mouth 2 (two) times daily with a meal. .  Cholecalciferol 1.25 MG (50000 UT) capsule, Take one capsule by mouth once a week for 12 weeks.  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  Screening Tests Immunization History  Administered Date(s) Administered  . Influenza Split 04/08/2012  . Influenza, High Dose Seasonal PF 03/04/2017  . Influenza,inj,Quad PF,6+ Mos 05/16/2015, 02/15/2018  . Pneumococcal Conjugate-13 07/25/2014  . Pneumococcal Polysaccharide-23 04/08/2012  . Pneumococcal-Unspecified 06/06/2002  . Td 06/30/2011  . Zoster 05/22/2008    Preventative care: Last colonoscopy: 2018  Prior vaccinations: TD or Tdap: 2013  Influenza: 2019 Pneumococcal: 2013 Prevnar13: 2016 Shingles/Zostavax: 2009  Names of Other Physician/Practitioners  you currently use: 1. [facility] here for primary care 2.Eye Exam Overdue 3. Dentist, has dentures.  Fit well, no issues.  Patient Care Team: Unk Pinto, MD as PCP - General (Internal Medicine) Luberta Mutter, MD as Consulting Physician (Ophthalmology) Gatha Mayer, MD as Consulting Physician (Gastroenterology) Brand Males, MD as Consulting Physician (Pulmonary Disease)  Surgical: He  has a past surgical history that includes right leg amputation (05/14/1977); Cardiac catheterization (4098); Colonoscopy w/ polypectomy (08/12/2010); Inguinal hernia repair; Esophagogastroduodenoscopy; Shoulder arthroscopy (Left, 2000); Cataract extraction, bilateral; Colonoscopy; Cystoscopy w/ ureteral stent placement (Right, 03/04/2018); UPPER GASTROINTESTINAL ENDOSCOPY  WITH DILATION (02/24/2018); Colonoscopy w/ polypectomy (07/16/2016); and Cystoscopy/ureteroscopy/holmium  laser/stent placement (Right, 03/16/2018). Family His family history includes Cancer in his mother; Colon cancer in his brother; Hypertension in his father; Leukemia in his brother; Other in his brother; Pancreatic cancer in his mother; Prostate cancer in his brother. Social history  He reports that he quit smoking about 18 years ago. His smoking use included cigarettes. He has a 60.00 pack-year smoking history. He has never used smokeless tobacco. He reports previous alcohol use. He reports that he does not use drugs.  MEDICARE WELLNESS OBJECTIVES: Physical activity: Current Exercise Habits: Home exercise routine, Type of exercise: Other - see comments(Yard and house work), Intensity: Mild, Exercise limited by: respiratory conditions(s) Cardiac risk factors: Cardiac Risk Factors include: advanced age (>14mn, >>33women);male gender Depression/mood screen:   [facility] 08/[facility]  Decreased Interest 0  Down, Depressed, Hopeless 0  PHQ - 2 Score 0    ADLs:  In your present state of health, do you have any difficulty performing the following activities: 08/03/2018 04/27/2018  Hearing? N N  Vision? N N  Difficulty concentrating or making decisions? N N  Walking or climbing stairs? N N  Comment - -  Dressing or bathing? N N  Doing errands, shopping? N N  Preparing Food and eating ? N -  Using the Toilet? N -  In the past six months, have you accidently leaked urine? N -  Do you have problems with loss of bowel control? N -  Managing your Medications? N -  Managing your Finances? N -  Housekeeping or managing your Housekeeping? N -  Some recent data might be hidden     Cognitive Testing  Alert? Yes  Normal Appearance?Yes  Oriented to person? Yes  Place? Yes   Time? Yes  Recall of three objects?  Yes  Can perform simple calculations? Yes  Displays appropriate judgment?Yes  Can read the correct time from a watch face?Yes  EOL planning: Does Patient Have a Medical  Advance Directive?: No Would patient like information on creating a medical advance directive?: No - Patient declined   Objective:   Today's Vitals   07/29/18 0857  BP: 122/72  Pulse: 76  Temp: 97.7 F (36.5 C)  SpO2: 96%  Weight: 140 lb (63.5 kg)  Height: 5' 10"  (1.778 m)   Body mass index is 20.09 kg/m.  General appearance: alert, no distress, WD/WN, male HEENT: normocephalic, sclerae anicteric, TMs pearly, nares patent, no discharge or erythema, pharynx normal Oral cavity: MMM, no lesions Neck: supple, no lymphadenopathy, no thyromegaly, no masses Heart: RRR, normal S1, S2, no murmurs Lungs: CTA bilaterally, no wheezes, rhonchi, or rales Abdomen: +bs, soft, non tender, non distended, no masses, no hepatomegaly, no splenomegaly Musculoskeletal: nontender, no swelling, no obvious deformity Extremities: no edema, no cyanosis, no clubbing Pulses: 2+ symmetric, upper and lower  extremities, normal cap refill Neurological: alert, oriented x 3, CN2-12 intact, strength normal upper extremities and lower extremities, sensation normal throughout, DTRs 2+ throughout, no cerebellar signs, gait normal Psychiatric: normal affect, behavior normal, pleasant   Medicare Attestation I have personally reviewed: The patient's medical and social history Their use of alcohol, tobacco or illicit drugs Their current medications and supplements The patient's functional ability including ADLs,fall risks, home safety risks, cognitive, and hearing and visual impairment Diet and physical activities Evidence for depression or mood disorders  The patient's weight, height, BMI, and visual acuity have been recorded in the chart.  I have made referrals, counseling, and provided education to the patient based on review of the above and I have provided the patient with a written personalized care plan for preventive services.     Garnet Sierras, NP   08/03/2018

## 2018-08-01 LAB — COMPLETE METABOLIC PANEL WITH GFR
AST: 18 U/L (ref 10–35)
Alkaline phosphatase (APISO): 88 U/L (ref 35–144)
BUN: 14 mg/dL (ref 7–25)
CO2: 30 mmol/L (ref 20–32)
Calcium: 9.9 mg/dL (ref 8.6–10.3)
Chloride: 101 mmol/L (ref 98–110)
Creat: 1.07 mg/dL (ref 0.70–1.18)
GFR, Est Non African American: 66 mL/min/{1.73_m2} (ref >=60)
Globulin: 2.6 g/dL (calc) (ref 1.9–3.7)
Glucose, Bld: 79 mg/dL (ref 65–99)
Sodium: 139 mmol/L (ref 135–146)
Total Bilirubin: 0.5 mg/dL (ref 0.2–1.2)

## 2018-08-01 LAB — COMPLETE METABOLIC PANEL WITHOUT GFR
AG Ratio: 1.6 (calc) (ref 1.0–2.5)
ALT: 14 U/L (ref 9–46)
Albumin: 4.2 g/dL (ref 3.6–5.1)
GFR, Est African American: 77 mL/min/1.73m2 (ref >=60)
Potassium: 4.2 mmol/L (ref 3.5–5.3)
Total Protein: 6.8 g/dL (ref 6.1–8.1)

## 2018-08-01 LAB — CBC WITH DIFFERENTIAL/PLATELET
Absolute Monocytes: 861 {cells}/uL (ref 200–950)
Basophils Absolute: 150 cells/uL (ref 0–200)
Basophils Relative: 1.9 %
Eosinophils Absolute: 450 {cells}/uL (ref 15–500)
Eosinophils Relative: 5.7 %
HCT: 41.7 % (ref 38.5–50.0)
Hemoglobin: 14.5 g/dL (ref 13.2–17.1)
Lymphs Abs: 869 {cells}/uL (ref 850–3900)
MCH: 33.3 pg — ABNORMAL HIGH (ref 27.0–33.0)
MCHC: 34.8 g/dL (ref 32.0–36.0)
MCV: 95.6 fL (ref 80.0–100.0)
MPV: 8.8 fL (ref 7.5–12.5)
Monocytes Relative: 10.9 %
Neutro Abs: 5570 {cells}/uL (ref 1500–7800)
Neutrophils Relative %: 70.5 %
Platelets: 386 10*3/uL (ref 140–400)
RBC: 4.36 10*6/uL (ref 4.20–5.80)
RDW: 12.6 % (ref 11.0–15.0)
Total Lymphocyte: 11 %
WBC: 7.9 10*3/uL (ref 3.8–10.8)

## 2018-08-01 LAB — LIPID PANEL
Cholesterol: 219 mg/dL — ABNORMAL HIGH (ref <200)
HDL: 48 mg/dL (ref >=40)
LDL Cholesterol (Calc): 137 mg/dL (calc) — ABNORMAL HIGH
Non-HDL Cholesterol (Calc): 171 mg/dL — ABNORMAL HIGH (ref <130)
Total CHOL/HDL Ratio: 4.6 (calc) (ref <5.0)
Triglycerides: 198 mg/dL — ABNORMAL HIGH (ref <150)

## 2018-08-01 LAB — HEMOGLOBIN A1C
Hgb A1c MFr Bld: 6.1 % of total Hgb — ABNORMAL HIGH (ref <5.7)
Mean Plasma Glucose: 128 (calc)
eAG (mmol/L): 7.1 (calc)

## 2018-08-01 LAB — VITAMIN D 25 HYDROXY (VIT D DEFICIENCY, FRACTURES): Vit D, 25-Hydroxy: 38 ng/mL (ref 30–100)

## 2018-08-01 LAB — VITAMIN B12: Vitamin B-12: 405 pg/mL (ref 200–1100)

## 2018-08-01 LAB — TSH: TSH: 1.07 mIU/L (ref 0.40–4.50)

## 2018-08-01 LAB — INSULIN, RANDOM: Insulin: 2.7 u[IU]/mL

## 2018-08-01 LAB — MAGNESIUM: Magnesium: 1.8 mg/dL (ref 1.5–2.5)

## 2018-08-02 ENCOUNTER — Other Ambulatory Visit: Payer: Self-pay | Admitting: Adult Health Nurse Practitioner

## 2018-08-02 DIAGNOSIS — E559 Vitamin D deficiency, unspecified: Secondary | ICD-10-CM

## 2018-08-02 MED ORDER — CHOLECALCIFEROL 1.25 MG (50000 UT) PO CAPS: ORAL_CAPSULE | ORAL | 0 refills | Status: DC

## 2018-08-03 ENCOUNTER — Encounter: Payer: Self-pay | Admitting: Adult Health Nurse Practitioner

## 2018-08-22 DIAGNOSIS — H524 Presbyopia: Secondary | ICD-10-CM | POA: Diagnosis not present

## 2018-08-22 DIAGNOSIS — Z961 Presence of intraocular lens: Secondary | ICD-10-CM | POA: Diagnosis not present

## 2018-08-25 DIAGNOSIS — R69 Illness, unspecified: Secondary | ICD-10-CM | POA: Diagnosis not present

## 2018-10-15 ENCOUNTER — Other Ambulatory Visit: Payer: Self-pay | Admitting: Adult Health Nurse Practitioner

## 2018-10-15 DIAGNOSIS — E559 Vitamin D deficiency, unspecified: Secondary | ICD-10-CM

## 2018-10-19 ENCOUNTER — Other Ambulatory Visit: Payer: Self-pay | Admitting: Internal Medicine

## 2018-10-19 DIAGNOSIS — J449 Chronic obstructive pulmonary disease, unspecified: Secondary | ICD-10-CM

## 2018-11-06 ENCOUNTER — Encounter: Payer: Self-pay | Admitting: Internal Medicine

## 2018-11-06 NOTE — Progress Notes (Signed)
History of Present Illness:      This very nice 78 y.o. MWM presents for 6 month follow up with HTN, HLD, Pre-Diabetes and Vitamin D Deficiency.  Patient has COPD & is on MDI's & Neb bronchodilators and also low dose prednisone. Patient has GERD  & has hx/o a tortuous esophagus dilated in the past. Patient also  has hx/o a traumatic RLE  BKA at work in 1988.       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 Todays BP was elevated & rechecked at 168/92.  Patient has had no complaints of any cardiac type chest pain, palpitations, dyspnea / orthopnea / PND, dizziness, claudication, or dependent edema.      Hyperlipidemia is not controlled with diet & he's not on meds. Last Lipids were not at goal: Lab Results  Component Value Date   CHOL 219 (H) 07/29/2018   HDL 48 07/29/2018   LDLCALC 137 (H) 07/29/2018   TRIG 198 (H) 07/29/2018   CHOLHDL 4.6 07/29/2018       Also, the patient has history of PreDiabetes  (A1c 5.9% / 2011 and 6.1% / 2015) andand has had no symptoms of reactive hypoglycemia, diabetic polys, paresthesias or visual blurring.  Last A1c was  Lab Results  Component Value Date   HGBA1C 6.1 (H) 07/29/2018      Further, the patient also has history of Vitamin D Deficiency ("24" / 2008)  and supplements vitamin D without any suspected side-effects. Last vitamin D was still very low: Lab Results  Component Value Date   VD25OH 38 07/29/2018   Current Outpatient Medications on File Prior to Visit  Medication Sig   albuterol (PROVENTIL HFA;VENTOLIN HFA) 108 (90 Base) MCG/ACT inhaler Inhale 2 puffs into the lungs every 6 (six) hours as needed for wheezing or shortness of breath.   albuterol (PROVENTIL) (2.5 MG/3ML) 0.083% nebulizer solution Take 3 mLs (2.5 mg total) by nebulization every 4 (four) hours as needed for wheezing or shortness of breath.   calcium carbonate (CALCIUM 600) 600 MG TABS tablet Take 1 tablet (600 mg total) by mouth 2 (two) times  daily with a meal.   Cholecalciferol (VITAMIN D3) 1.25 MG (50000 UT) CAPS Take 1 capsule Once Weekly   nitroGLYCERIN (NITROSTAT) 0.4 MG SL tablet Place 1 tablet (0.4 mg total) under the tongue every 5 (five) minutes x 3 doses as needed for chest pain.   omeprazole (PRILOSEC) 40 MG capsule Take 1 capsule (40 mg total) by mouth daily before breakfast.   predniSONE (DELTASONE) 5 MG tablet TAKE 1 TABLET TWICE A DAY OR AS DIRECTED BY DOCTOR   Respiratory Therapy Supplies (NEBULIZER/TUBING/MOUTHPIECE) KIT Disp one nebulizer machine, tubing set and mouthpiece kit   No current facility-administered medications on file prior to visit.    No Known Allergies   PMHx:   Past Medical History:  Diagnosis Date   Aortic atherosclerosis ([place]) 03/04/2018   Noted on CT Abd/pelvis   Cataract    Bilateral   Colon polyp    COPD (chronic obstructive pulmonary disease) (HCC)    Coronary artery disease    Diverticulosis 07/16/2016   SIGMOID, NOTED ON COLONOSCOPY   Esophageal stenosis 02/24/2018   noted on endoscopy   Esophageal stricture    GERD (gastroesophageal reflux disease)    Grade I diastolic dysfunction 56/21/3086   noted on ECHO   History of hiatal hernia 02/24/2018   Small noted on endoscopy   Hydronephrosis  Hydronephrosis of right kidney 03/04/2018   URETERAL STONE 7 MM, NOTED ONJ CT ABD/PELVIS   Hyperlipidemia    Myocardial infarction [facility])    [id number]   Nephrolithiasis    s/p stent placement   Skin cancer    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 NOTED ON CT ABD/PELVIS   Vitamin D deficiency    Wears dentures    upper   Immunization History  Administered Date(s) Administered   Influenza Split 04/08/2012   Influenza, High Dose Seasonal PF 03/04/2017   Influenza,inj,Quad PF,6+ Mos 05/16/2015, 02/15/2018   Pneumococcal Conjugate-13 07/25/2014   Pneumococcal Polysaccharide-23 04/08/2012    Pneumococcal-Unspecified 06/06/2002   Td 06/30/2011   Zoster 05/22/2008   Past Surgical History:  Procedure Laterality Date   CARDIAC CATHETERIZATION  1983   No intervention performed.    CATARACT EXTRACTION, BILATERAL     COLONOSCOPY     COLONOSCOPY W/ POLYPECTOMY  08/12/2010   Dr. Silvano Rusk   COLONOSCOPY W/ POLYPECTOMY  07/16/2016   CYSTOSCOPY W/ URETERAL STENT PLACEMENT Right 03/04/2018   Procedure: CYSTOSCOPY WITH RETROGRADE PYELOGRAM/URETERAL STENT PLACEMENT;  Surgeon: [name], MD;  [facility];  Service: Urology;  Laterality: Right;   CYSTOSCOPY/URETEROSCOPY/HOLMIUM LASER/STENT PLACEMENT Right 03/16/2018   Procedure: CYSTOSCOPY RIGHT URETEROSCOPY/HOLMIUM LASER/STENT PLACEMENT;  Surgeon: [name], MD;  [facility];  Service: Urology;  Laterality: Right;   ESOPHAGOGASTRODUODENOSCOPY     INGUINAL HERNIA REPAIR     left   right leg amputation  05/14/1977   SHOULDER ARTHROSCOPY Left 2000   UPPER GASTROINTESTINAL ENDOSCOPY  WITH DILATION  02/24/2018   FHx:    Reviewed / unchanged  SHx:    Reviewed / unchanged   Systems Review:  Constitutional: Denies fever, chills, wt changes, headaches, insomnia, fatigue, night sweats, change in appetite. Eyes: Denies redness, blurred vision, diplopia, discharge, itchy, watery eyes.  ENT: Denies discharge, congestion, post nasal drip, epistaxis, sore throat, earache, hearing loss, dental pain, tinnitus, vertigo, sinus pain, snoring.  CV: Denies chest pain, palpitations, irregular heartbeat, syncope, dyspnea, diaphoresis, orthopnea, PND, claudication or edema. Respiratory: denies cough, dyspnea, DOE, pleurisy, hoarseness, laryngitis, wheezing.  Gastrointestinal: Denies dysphagia, odynophagia, heartburn, reflux, water brash, abdominal pain or cramps, nausea, vomiting, bloating, diarrhea, constipation, hematemesis, melena, hematochezia  or hemorrhoids. Genitourinary: Denies dysuria,  frequency, urgency, nocturia, hesitancy, discharge, hematuria or flank pain. Musculoskeletal: Denies arthralgias, myalgias, stiffness, jt. swelling, pain, limping or strain/sprain.  Skin: Denies pruritus, rash, hives, warts, acne, eczema or change in skin lesion(s). Neuro: No weakness, tremor, incoordination, spasms, paresthesia or pain. Psychiatric: Denies confusion, memory loss or sensory loss. Endo: Denies change in weight, skin or hair change.  Heme/Lymph: No excessive bleeding, bruising or enlarged lymph nodes.  Physical Exam  BP (!) 162/98    Pulse 68    Temp (!) 97.5 F (36.4 C)    Resp 16    Ht 5' 10" (1.778 m)    Wt 143 lb 6.4 oz (65 kg)    BMI 20.58 kg/m   Appears  well nourished, well groomed  and in no distress.  Eyes: PERRLA, EOMs, conjunctiva no swelling or erythema. Sinuses: No frontal/maxillary tenderness ENT/Mouth: EAC's clear, TM's nl w/o erythema, bulging. Nares clear w/o erythema, swelling, exudates. Oropharynx clear without erythema or exudates. Oral hygiene is good. Tongue normal, non obstructing. Hearing intact.  Neck: Supple. Thyroid not palpable. Car 2+/2+ without bruits, nodes or JVD. Chest:  Respirations nl with BS clear & equal w/o rales, rhonchi, wheezing or stridor.  Cor: Heart sounds normal w/ regular rate and rhythm without sig. murmurs, gallops, clicks or rubs. Peripheral pulses normal and equal  without edema.  Abdomen: Soft & bowel sounds normal. Non-tender w/o guarding, rebound, hernias, masses or organomegaly.  Lymphatics: Unremarkable.  Musculoskeletal: Rt BKA w/ prosthesis.  Sl limping gait.  Skin: Warm, dry without exposed rashes, lesions or ecchymosis apparent.  Neuro: Cranial nerves intact, reflexes equal bilaterally. Sensory-motor testing grossly intact. Tendon reflexes grossly intact.  Pysch: Alert & oriented x 3.  Insight and judgement nl & appropriate. No ideations.  Assessment and Plan:  1. Essential hypertension  - New Rx Benicar/HCTZ  20/12.5   - Encouraged home monitor blood pressure  & Call if elevated over 145/90 - Continue DASH diet.  Reminder to go to the ER if any CP,  SOB, nausea, dizziness, severe HA, changes vision/speech.  - CBC with Differential/Platelet - COMPLETE METABOLIC PANEL WITH GFR - Magnesium - TSH  2. Hyperlipidemia, mixed  - Continue diet/meds, exercise,& lifestyle modifications.  - Continue monitor periodic cholesterol/liver & renal functions   - Lipid panel  3. Abnormal glucose  - Continue diet, exercise  - Lifestyle modifications.  - Monitor appropriate labs.  - Hemoglobin A1c - Insulin, random  4. Vitamin D deficiency  - Continue supplementation.  - VITAMIN D 25 Hydroxyl  5. Prediabetes  - Hemoglobin A1c - Insulin, random  6. S/P unilateral BKA (below knee amputation), right ([place])   7. Chronic obstructive pulmonary disease ([place])   8. Gastroesophageal reflux disease  - CBC with Differential/Platelet  9. Medication management  - CBC with Differential/Platelet - COMPLETE METABOLIC PANEL WITH GFR - Magnesium - Lipid panel - TSH - Hemoglobin A1c - Insulin, random - VITAMIN D 25 Hydroxyl        Discussed  regular exercise, BP monitoring, weight control to achieve/maintain BMI less than 25 and discussed med and SE's. Recommended labs to assess and monitor clinical status with further disposition pending results of labs. I discussed the assessment and treatment plan with the patient. The patient was provided an opportunity to ask questions and all were answered. The patient agreed with the plan and demonstrated an understanding of the instructions. I provided over 25 minutes of exam, counseling, chart review and  complex critical decision making was performed   Kirtland Bouchard, MD

## 2018-11-06 NOTE — Patient Instructions (Signed)

## 2018-11-07 ENCOUNTER — Other Ambulatory Visit: Payer: Self-pay

## 2018-11-07 ENCOUNTER — Ambulatory Visit (INDEPENDENT_AMBULATORY_CARE_PROVIDER_SITE_OTHER): Payer: Medicare HMO | Admitting: Internal Medicine

## 2018-11-07 VITALS — BP 162/98 | HR 68 | Temp 97.5°F | Resp 16 | Ht 70.0 in | Wt 143.4 lb

## 2018-11-07 DIAGNOSIS — R7309 Other abnormal glucose: Secondary | ICD-10-CM | POA: Diagnosis not present

## 2018-11-07 DIAGNOSIS — I10 Essential (primary) hypertension: Secondary | ICD-10-CM

## 2018-11-07 DIAGNOSIS — E782 Mixed hyperlipidemia: Secondary | ICD-10-CM

## 2018-11-07 DIAGNOSIS — Z89511 Acquired absence of right leg below knee: Secondary | ICD-10-CM

## 2018-11-07 DIAGNOSIS — K219 Gastro-esophageal reflux disease without esophagitis: Secondary | ICD-10-CM

## 2018-11-07 DIAGNOSIS — E559 Vitamin D deficiency, unspecified: Secondary | ICD-10-CM

## 2018-11-07 DIAGNOSIS — R7303 Prediabetes: Secondary | ICD-10-CM | POA: Diagnosis not present

## 2018-11-07 DIAGNOSIS — J449 Chronic obstructive pulmonary disease, unspecified: Secondary | ICD-10-CM | POA: Diagnosis not present

## 2018-11-07 DIAGNOSIS — Z79899 Other long term (current) drug therapy: Secondary | ICD-10-CM

## 2018-11-07 MED ORDER — OLMESARTAN MEDOXOMIL-HCTZ 20-12.5 MG PO TABS: ORAL_TABLET | ORAL | 5 refills | Status: DC

## 2018-11-08 ENCOUNTER — Other Ambulatory Visit: Payer: Self-pay | Admitting: Internal Medicine

## 2018-11-08 DIAGNOSIS — E782 Mixed hyperlipidemia: Secondary | ICD-10-CM

## 2018-11-08 LAB — COMPLETE METABOLIC PANEL WITH GFR
AG Ratio: 1.6 (calc) (ref 1.0–2.5)
ALT: 13 U/L (ref 9–46)
AST: 17 U/L (ref 10–35)
Albumin: 3.8 g/dL (ref 3.6–5.1)
Alkaline phosphatase (APISO): 55 U/L (ref 35–144)
BUN/Creatinine Ratio: 14 (calc) (ref 6–22)
BUN: 18 mg/dL (ref 7–25)
CO2: 29 mmol/L (ref 20–32)
Calcium: 9.8 mg/dL (ref 8.6–10.3)
Chloride: 102 mmol/L (ref 98–110)
Creat: 1.29 mg/dL — ABNORMAL HIGH (ref 0.70–1.18)
GFR, Est African American: 61 mL/min/{1.73_m2} (ref >=60)
GFR, Est Non African American: 53 mL/min/{1.73_m2} — ABNORMAL LOW (ref >=60)
Globulin: 2.4 g/dL (calc) (ref 1.9–3.7)
Glucose, Bld: 100 mg/dL — ABNORMAL HIGH (ref 65–99)
Potassium: 4.1 mmol/L (ref 3.5–5.3)
Sodium: 138 mmol/L (ref 135–146)
Total Bilirubin: 0.3 mg/dL (ref 0.2–1.2)
Total Protein: 6.2 g/dL (ref 6.1–8.1)

## 2018-11-08 LAB — LIPID PANEL
Cholesterol: 217 mg/dL — ABNORMAL HIGH (ref <200)
HDL: 55 mg/dL (ref >=40)
LDL Cholesterol (Calc): 135 mg/dL (calc) — ABNORMAL HIGH
Non-HDL Cholesterol (Calc): 162 mg/dL (calc) — ABNORMAL HIGH (ref <130)
Total CHOL/HDL Ratio: 3.9 (calc) (ref <5.0)
Triglycerides: 148 mg/dL (ref <150)

## 2018-11-08 LAB — HEMOGLOBIN A1C
Hgb A1c MFr Bld: 5.9 % of total Hgb — ABNORMAL HIGH (ref <5.7)
Mean Plasma Glucose: 123 (calc)
eAG (mmol/L): 6.8 (calc)

## 2018-11-08 LAB — CBC WITH DIFFERENTIAL/PLATELET
Absolute Monocytes: 490 cells/uL (ref 200–950)
Basophils Absolute: 86 cells/uL (ref 0–200)
Basophils Relative: 1.2 %
Eosinophils Absolute: 122 cells/uL (ref 15–500)
Eosinophils Relative: 1.7 %
HCT: 41.6 % (ref 38.5–50.0)
Hemoglobin: 14.2 g/dL (ref 13.2–17.1)
Lymphs Abs: 835 cells/uL — ABNORMAL LOW (ref 850–3900)
MCH: 33.1 pg — ABNORMAL HIGH (ref 27.0–33.0)
MCHC: 34.1 g/dL (ref 32.0–36.0)
MCV: 97 fL (ref 80.0–100.0)
MPV: 8.9 fL (ref 7.5–12.5)
Monocytes Relative: 6.8 %
Neutro Abs: 5666 cells/uL (ref 1500–7800)
Neutrophils Relative %: 78.7 %
Platelets: 287 10*3/uL (ref 140–400)
RBC: 4.29 10*6/uL (ref 4.20–5.80)
RDW: 13.1 % (ref 11.0–15.0)
Total Lymphocyte: 11.6 %
WBC: 7.2 10*3/uL (ref 3.8–10.8)

## 2018-11-08 LAB — INSULIN, RANDOM: Insulin: 5.9 u[IU]/mL

## 2018-11-08 LAB — MAGNESIUM: Magnesium: 1.7 mg/dL (ref 1.5–2.5)

## 2018-11-08 LAB — VITAMIN D 25 HYDROXY (VIT D DEFICIENCY, FRACTURES): Vit D, 25-Hydroxy: 145 ng/mL — ABNORMAL HIGH (ref 30–100)

## 2018-11-08 LAB — TSH: TSH: 1.28 mIU/L (ref 0.40–4.50)

## 2018-11-08 MED ORDER — ROSUVASTATIN CALCIUM 20 MG PO TABS: ORAL_TABLET | ORAL | 3 refills | Status: DC

## 2018-12-21 ENCOUNTER — Other Ambulatory Visit: Payer: Self-pay | Admitting: *Deleted

## 2018-12-21 DIAGNOSIS — J209 Acute bronchitis, unspecified: Secondary | ICD-10-CM

## 2018-12-21 MED ORDER — ALBUTEROL SULFATE HFA 108 (90 BASE) MCG/ACT IN AERS: 2.0000 | INHALATION_SPRAY | Freq: Four times a day (QID) | RESPIRATORY_TRACT | 6 refills | Status: DC | PRN

## 2018-12-22 ENCOUNTER — Other Ambulatory Visit: Payer: Self-pay | Admitting: *Deleted

## 2018-12-22 DIAGNOSIS — R69 Illness, unspecified: Secondary | ICD-10-CM | POA: Diagnosis not present

## 2018-12-22 DIAGNOSIS — J441 Chronic obstructive pulmonary disease with (acute) exacerbation: Secondary | ICD-10-CM

## 2018-12-22 DIAGNOSIS — R0602 Shortness of breath: Secondary | ICD-10-CM

## 2018-12-22 MED ORDER — ALBUTEROL SULFATE (2.5 MG/3ML) 0.083% IN NEBU: 2.5000 mg | INHALATION_SOLUTION | RESPIRATORY_TRACT | 1 refills | Status: DC | PRN

## 2019-02-06 NOTE — Progress Notes (Signed)
FOLLOW UP Assessment:   Essential hypertension - continue medications, DASH diet, exercise and monitor at home. Call if greater than 130/80.  -     CBC with Differential/Platelet -     COMPLETE METABOLIC PANEL WITH GFR -     TSH  Hyperlipidemia, mixed check lipids decrease fatty foods increase activity.  -     Lipid panel  Medication management -     Magnesium  Vitamin D deficiency -     VITAMIN D 25 Hydroxy (Vit-D Deficiency, Fractures)  COPD No triggers, well controlled symptoms, cont to monitor -     albuterol (PROVENTIL) (2.5 MG/3ML) 0.083% nebulizer solution; Take 3 mLs (2.5 mg total) by nebulization every 4 (four) hours as needed for wheezing or shortness of breath.  Abnormal glucose -     Hemoglobin A1c  Basal cell carcinoma (BCC), unspecified site -     Ambulatory referral to Dermatology - has place on left ear concerning for basal cell and several places on bilateral arm will send for derm referral.  May need MOHS    Over 30 minutes of exam, counseling, chart review, and critical decision making was performed  Future Appointments  Date Time Provider [place]  05/22/2019 10:00 AM Unk Pinto, MD [facility] None  08/22/2019 11:15 AM Garnet Sierras, NP [facility] None     Subjective:  Leslie Cardenas is a 78 y.o. male who presents for Medicare Annual Wellness Visit and 3 month follow up for HTN, hyperlipidemia, prediabetes, CKDIII, COPD GERD and vitamin D Def.   His blood pressure has been controlled at home, today their BP is BP: 134/70  BMI is Body mass index is 20.23 kg/m., he is working on diet and exercise. Wt Readings from Last 3 Encounters:  02/09/19 141 lb (64 kg)  11/07/18 143 lb 6.4 oz (65 kg)  07/29/18 140 lb (63.5 kg)   He does workout by means of yard and housework. He denies chest pain, dizziness.  He has COPD and has right total knee and has trouble getting to car occ, will send in parking pass.   He is not on cholesterol  medication and denies myalgias. His cholesterol is not at goal. The cholesterol last visit was:   Lab Results  Component Value Date   CHOL 217 (H) 11/07/2018   HDL 55 11/07/2018   LDLCALC 135 (H) 11/07/2018   TRIG 148 11/07/2018   CHOLHDL 3.9 11/07/2018   He has been working on diet and exercise for prediabetes, and denies hyperglycemia, hypoglycemia , increased appetite, nausea, polydipsia and polyuria. Last A1C in the office was:  Lab Results  Component Value Date   HGBA1C 5.9 (H) 11/07/2018   Last GFR Lab Results  Component Value Date   GFRNONAA 53 (L) 11/07/2018    Patient is on Vitamin D supplement.   Lab Results  Component Value Date   VD25OH 145 (H) 11/07/2018      Medication Review:  Current Outpatient Medications (Endocrine & Metabolic):  .  predniSONE (DELTASONE) 5 MG tablet, TAKE 1 TABLET TWICE A DAY OR AS DIRECTED BY DOCTOR  Current Outpatient Medications (Cardiovascular):  .  nitroGLYCERIN (NITROSTAT) 0.4 MG SL tablet, Place 1 tablet (0.4 mg total) under the tongue every 5 (five) minutes x 3 doses as needed for chest pain. Marland Kitchen  olmesartan-hydrochlorothiazide (BENICAR HCT) 20-12.5 MG tablet, Take 1 tablet Daily for BP .  rosuvastatin (CRESTOR) 20 MG tablet, Take 1 tablet daily for Cholesterol  Current Outpatient Medications (Respiratory):  .  albuterol (PROVENTIL) (2.5 MG/3ML) 0.083% nebulizer solution, Take 3 mLs (2.5 mg total) by nebulization every 4 (four) hours as needed for wheezing or shortness of breath. Marland Kitchen  albuterol (VENTOLIN HFA) 108 (90 Base) MCG/ACT inhaler, Inhale 2 puffs into the lungs every 6 (six) hours as needed for wheezing or shortness of breath.    Current Outpatient Medications (Other):  .  calcium carbonate (CALCIUM 600) 600 MG TABS tablet, Take 1 tablet (600 mg total) by mouth 2 (two) times daily with a meal. .  Cholecalciferol (VITAMIN D3) 1.25 MG (50000 UT) CAPS, Take 1 capsule Once Weekly .  omeprazole (PRILOSEC) 40 MG capsule, Take 1  capsule (40 mg total) by mouth daily before breakfast. .  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  Surgical: He  has a past surgical history that includes right leg amputation (05/14/1977); Cardiac catheterization (6834); Colonoscopy w/ polypectomy (08/12/2010); Inguinal hernia repair; Esophagogastroduodenoscopy; Shoulder arthroscopy (Left, 2000); Cataract extraction, bilateral; Colonoscopy; Cystoscopy w/ ureteral stent placement (Right, 03/04/2018); UPPER GASTROINTESTINAL ENDOSCOPY  WITH DILATION (02/24/2018); Colonoscopy w/ polypectomy (07/16/2016); and Cystoscopy/ureteroscopy/holmium laser/stent placement (Right, 03/16/2018). Family His family history includes Cancer in his mother; Colon cancer in his brother; Hypertension in his father; Leukemia in his brother; Other in his brother; Pancreatic cancer in his mother; Prostate cancer in his brother. Social history  He reports that he quit smoking about 18 years ago. His smoking use included cigarettes. He has a 60.00 pack-year smoking history. He has never used smokeless tobacco. He reports previous alcohol use. He reports that he does not use drugs.   Objective:   Today's Vitals   02/09/19 1044  BP: 134/70  Pulse: 86  Temp: 97.7 F (36.5 C)  SpO2: 98%  Weight: 141  lb (64 kg)  Height: 5' 10" (1.778 m)   Body mass index is 20.23 kg/m.  General appearance: alert, no distress, WD/WN, male HEENT: normocephalic, sclerae anicteric, TMs pearly, nares patent, no discharge or erythema, pharynx normal Oral cavity: MMM, no lesions Neck: supple, no lymphadenopathy, no thyromegaly, no masses Heart: RRR, normal S1, S2, no murmurs Lungs: CTA bilaterally, no wheezes, rhonchi, or rales Abdomen: +bs, soft, non tender, non distended, no masses, no hepatomegaly, no splenomegaly Musculoskeletal: nontender, no swelling, no obvious deformity Extremities: no edema, no cyanosis, no clubbing Pulses: 2+ symmetric, upper and lower extremities, normal cap refill Neurological: alert, oriented x 3, CN2-12 intact, strength normal upper extremities and lower extremities, sensation normal throughout, DTRs 2+ throughout, no cerebellar signs, gait normal, has right BKA Psychiatric: normal affect, behavior normal, pleasant  Skin: bilateral arms with erythema, scaly area, same on cheeks, left ear with non healing ulcer     Vicie Mutters, PA-C   02/09/2019

## 2019-02-09 ENCOUNTER — Encounter: Payer: Self-pay | Admitting: Physician Assistant

## 2019-02-09 ENCOUNTER — Other Ambulatory Visit: Payer: Self-pay

## 2019-02-09 ENCOUNTER — Ambulatory Visit (INDEPENDENT_AMBULATORY_CARE_PROVIDER_SITE_OTHER): Payer: Medicare HMO | Admitting: Physician Assistant

## 2019-02-09 VITALS — BP 134/70 | HR 86 | Temp 97.7°F | Ht 70.0 in | Wt 141.0 lb

## 2019-02-09 DIAGNOSIS — E782 Mixed hyperlipidemia: Secondary | ICD-10-CM | POA: Diagnosis not present

## 2019-02-09 DIAGNOSIS — J441 Chronic obstructive pulmonary disease with (acute) exacerbation: Secondary | ICD-10-CM

## 2019-02-09 DIAGNOSIS — E559 Vitamin D deficiency, unspecified: Secondary | ICD-10-CM | POA: Diagnosis not present

## 2019-02-09 DIAGNOSIS — R0602 Shortness of breath: Secondary | ICD-10-CM

## 2019-02-09 DIAGNOSIS — J449 Chronic obstructive pulmonary disease, unspecified: Secondary | ICD-10-CM | POA: Diagnosis not present

## 2019-02-09 DIAGNOSIS — R7309 Other abnormal glucose: Secondary | ICD-10-CM

## 2019-02-09 DIAGNOSIS — C4491 Basal cell carcinoma of skin, unspecified: Secondary | ICD-10-CM

## 2019-02-09 DIAGNOSIS — Z79899 Other long term (current) drug therapy: Secondary | ICD-10-CM

## 2019-02-09 DIAGNOSIS — I10 Essential (primary) hypertension: Secondary | ICD-10-CM | POA: Diagnosis not present

## 2019-02-09 DIAGNOSIS — R69 Illness, unspecified: Secondary | ICD-10-CM | POA: Diagnosis not present

## 2019-02-09 MED ORDER — ALBUTEROL SULFATE (2.5 MG/3ML) 0.083% IN NEBU: 2.5000 mg | INHALATION_SOLUTION | RESPIRATORY_TRACT | 1 refills | Status: DC | PRN

## 2019-02-09 NOTE — Patient Instructions (Addendum)
Can get carpal tunnel brace for right finger to wear at night for 6-8 weeks If not better suggest seeing ortho, may be coming from your shoulder   Chronic Obstructive Pulmonary Disease  Chronic obstructive pulmonary disease (COPD) is a long-term (chronic) condition that affects the lungs. COPD is a general term that can be used to describe many different lung problems that cause lung swelling (inflammation) and limit airflow, including chronic bronchitis and emphysema. If you have COPD, your lung function will probably never return to normal. In most cases, it gets worse over time. However, there are steps you can take to slow the progression of the disease and improve your quality of life. What are the causes? This condition may be caused by:  Smoking. This is the most common cause.  Certain genes passed down through families. What increases the risk? The following factors may make you more likely to develop this condition:  Secondhand smoke from cigarettes, pipes, or cigars.  Exposure to chemicals and other irritants such as fumes and dust in the work environment.  Chronic lung conditions or infections. What are the signs or symptoms? Symptoms of this condition include:  Shortness of breath, especially during physical activity.  Chronic cough with a large amount of thick mucus. Sometimes the cough may not have any mucus (dry cough).  Wheezing.  Rapid breaths.  Gray or bluish discoloration (cyanosis) of the skin, especially in your fingers, toes, or lips.  Feeling tired (fatigue).  Weight loss.  Chest tightness.  Frequent infections.  Episodes when breathing symptoms become much worse (exacerbations).  Swelling in the ankles, feet, or legs. This may occur in later stages of the disease. How is this diagnosed? This condition is diagnosed based on:  Your medical history.  A physical exam. You may also have tests, including:  Lung (pulmonary) function tests. This  may include a spirometry test, which measures your ability to exhale properly.  Chest X-ray.  CT scan.  Blood tests. How is this treated? This condition may be treated with:  Medicines. These may include inhaled rescue medicines to treat acute exacerbations as well as long-term, or maintenance, medicines to prevent flare-ups of COPD. ? Bronchodilators help treat COPD by dilating the airways to allow increased airflow and make your breathing more comfortable. ? Steroids can reduce airway inflammation and help prevent exacerbations.  Smoking cessation. If you smoke, your health care provider may ask you to quit, and may also recommend therapy or replacement products to help you quit.  Pulmonary rehabilitation. This may involve working with a team of health care providers and specialists, such as respiratory, occupational, and physical therapists.  Exercise and physical activity. These are beneficial for nearly all people with COPD.  Nutrition therapy to gain weight, if you are underweight.  Oxygen. Supplemental oxygen therapy is only helpful if you have a low oxygen level in your blood (hypoxemia).  Lung surgery or transplant.  Palliative care. This is to help people with COPD feel comfortable when treatment is no longer working. Follow these instructions at home: Medicines  Take over-the-counter and prescription medicines (inhaled or pills) only as told by your health care provider.  Talk to your health care provider before taking any cough or allergy medicines. You may need to avoid certain medicines that dry out your airways. Lifestyle  If you are a smoker, the most important thing that you can do is to stop smoking. Do not use any products that contain nicotine or tobacco, such as cigarettes and  e-cigarettes. If you need help quitting, ask your health care provider. Continuing to smoke will cause the disease to progress faster.  Avoid exposure to things that irritate your  lungs, such as smoke, chemicals, and fumes.  Stay active, but balance activity with periods of rest. Exercise and physical activity will help you maintain your ability to do things you want to do.  Learn and use relaxation techniques to manage stress and to control your breathing.  Get the right amount of sleep and get quality sleep. Most adults need 7 or more hours per night.  Eat healthy foods. Eating smaller, more frequent meals and resting before meals may help you maintain your strength. Controlled breathing Learn and use controlled breathing techniques as directed by your health care provider. Controlled breathing techniques include:  Pursed lip breathing. Start by breathing in (inhaling) through your nose for 1 second. Then, purse your lips as if you were going to whistle and breathe out (exhale) through the pursed lips for 2 seconds.  Diaphragmatic breathing. Start by putting one hand on your abdomen just above your waist. Inhale slowly through your nose. The hand on your abdomen should move out. Then purse your lips and exhale slowly. You should be able to feel the hand on your abdomen moving in as you exhale. Controlled coughing Learn and use controlled coughing to clear mucus from your lungs. Controlled coughing is a series of short, progressive coughs. The steps of controlled coughing are: 1. Lean your head slightly forward. 2. Breathe in deeply using diaphragmatic breathing. 3. Try to hold your breath for 3 seconds. 4. Keep your mouth slightly open while coughing twice. 5. Spit any mucus out into a tissue. 6. Rest and repeat the steps once or twice as needed. General instructions  Make sure you receive all the vaccines that your health care provider recommends, especially the pneumococcal and influenza vaccines. Preventing infection and hospitalization is very important when you have COPD.  Use oxygen therapy and pulmonary rehabilitation if directed to by your health care  provider. If you require home oxygen therapy, ask your health care provider whether you should purchase a pulse oximeter to measure your oxygen level at home.  Work with your health care provider to develop a COPD action plan. This will help you know what steps to take if your condition gets worse.  Keep other chronic health conditions under control as told by your health care provider.  Avoid extreme temperature and humidity changes.  Avoid contact with people who have an illness that spreads from person to person (is contagious), such as viral infections or pneumonia.  Keep all follow-up visits as told by your health care provider. This is important. Contact a health care provider if:  You are coughing up more mucus than usual.  There is a change in the color or thickness of your mucus.  Your breathing is more labored than usual.  Your breathing is faster than usual.  You have difficulty sleeping.  You need to use your rescue medicines or inhalers more often than expected.  You have trouble doing routine activities such as getting dressed or walking around the house. Get help right away if:  You have shortness of breath while you are resting.  You have shortness of breath that prevents you from: ? Being able to talk. ? Performing your usual physical activities.  You have chest pain lasting longer than 5 minutes.  Your skin color is more blue (cyanotic) than usual.  You measure  low oxygen saturations for longer than 5 minutes with a pulse oximeter.  You have a fever.  You feel too tired to breathe normally. Summary  Chronic obstructive pulmonary disease (COPD) is a long-term (chronic) condition that affects the lungs.  Your lung function will probably never return to normal. In most cases, it gets worse over time. However, there are steps you can take to slow the progression of the disease and improve your quality of life.  Treatment for COPD may include taking  medicines, quitting smoking, pulmonary rehabilitation, and changes to diet and exercise. As the disease progresses, you may need oxygen therapy, a lung transplant, or palliative care.  To help manage your condition, do not smoke, avoid exposure to things that irritate your lungs, stay up to date on all vaccines, and follow your health care provider's instructions for taking medicines. This information is not intended to replace advice given to you by your health care provider. Make sure you discuss any questions you have with your health care provider. Document Released: 03/04/2005 Document Revised: 05/07/2017 Document Reviewed: 06/29/2016 Elsevier Patient Education  2020 [employer].

## 2019-02-10 LAB — COMPLETE METABOLIC PANEL WITH GFR
AG Ratio: 1.8 (calc) (ref 1.0–2.5)
ALT: 14 U/L (ref 9–46)
AST: 18 U/L (ref 10–35)
Albumin: 3.9 g/dL (ref 3.6–5.1)
Alkaline phosphatase (APISO): 65 U/L (ref 35–144)
BUN/Creatinine Ratio: 15 (calc) (ref 6–22)
BUN: 18 mg/dL (ref 7–25)
CO2: 30 mmol/L (ref 20–32)
Calcium: 9.8 mg/dL (ref 8.6–10.3)
Chloride: 102 mmol/L (ref 98–110)
Creat: 1.21 mg/dL — ABNORMAL HIGH (ref 0.70–1.18)
GFR, Est African American: 66 mL/min/{1.73_m2} (ref >=60)
GFR, Est Non African American: 57 mL/min/{1.73_m2} — ABNORMAL LOW (ref >=60)
Globulin: 2.2 g/dL (calc) (ref 1.9–3.7)
Glucose, Bld: 90 mg/dL (ref 65–99)
Potassium: 4.2 mmol/L (ref 3.5–5.3)
Sodium: 139 mmol/L (ref 135–146)
Total Bilirubin: 0.4 mg/dL (ref 0.2–1.2)
Total Protein: 6.1 g/dL (ref 6.1–8.1)

## 2019-02-10 LAB — LIPID PANEL
Cholesterol: 224 mg/dL — ABNORMAL HIGH (ref <200)
HDL: 53 mg/dL (ref >=40)
LDL Cholesterol (Calc): 127 mg/dL (calc) — ABNORMAL HIGH
Non-HDL Cholesterol (Calc): 171 mg/dL (calc) — ABNORMAL HIGH (ref <130)
Total CHOL/HDL Ratio: 4.2 (calc) (ref <5.0)
Triglycerides: 287 mg/dL — ABNORMAL HIGH (ref <150)

## 2019-02-10 LAB — CBC WITH DIFFERENTIAL/PLATELET
Absolute Monocytes: 988 cells/uL — ABNORMAL HIGH (ref 200–950)
Basophils Absolute: 152 cells/uL (ref 0–200)
Basophils Relative: 1.6 %
Eosinophils Absolute: 152 cells/uL (ref 15–500)
Eosinophils Relative: 1.6 %
HCT: 38.6 % (ref 38.5–50.0)
Hemoglobin: 13.4 g/dL (ref 13.2–17.1)
Lymphs Abs: 1102 cells/uL (ref 850–3900)
MCH: 33.6 pg — ABNORMAL HIGH (ref 27.0–33.0)
MCHC: 34.7 g/dL (ref 32.0–36.0)
MCV: 96.7 fL (ref 80.0–100.0)
MPV: 8.7 fL (ref 7.5–12.5)
Monocytes Relative: 10.4 %
Neutro Abs: 7106 cells/uL (ref 1500–7800)
Neutrophils Relative %: 74.8 %
Platelets: 397 10*3/uL (ref 140–400)
RBC: 3.99 10*6/uL — ABNORMAL LOW (ref 4.20–5.80)
RDW: 13.2 % (ref 11.0–15.0)
Total Lymphocyte: 11.6 %
WBC: 9.5 10*3/uL (ref 3.8–10.8)

## 2019-02-10 LAB — TSH: TSH: 1.37 mIU/L (ref 0.40–4.50)

## 2019-02-10 LAB — HEMOGLOBIN A1C
Hgb A1c MFr Bld: 6.1 % of total Hgb — ABNORMAL HIGH (ref <5.7)
Mean Plasma Glucose: 128 (calc)
eAG (mmol/L): 7.1 (calc)

## 2019-02-10 LAB — VITAMIN D 25 HYDROXY (VIT D DEFICIENCY, FRACTURES): Vit D, 25-Hydroxy: 71 ng/mL (ref 30–100)

## 2019-02-10 LAB — MAGNESIUM: Magnesium: 1.6 mg/dL (ref 1.5–2.5)

## 2019-02-17 DIAGNOSIS — B354 Tinea corporis: Secondary | ICD-10-CM | POA: Diagnosis not present

## 2019-02-17 DIAGNOSIS — L821 Other seborrheic keratosis: Secondary | ICD-10-CM | POA: Diagnosis not present

## 2019-02-17 DIAGNOSIS — L570 Actinic keratosis: Secondary | ICD-10-CM | POA: Diagnosis not present

## 2019-02-17 DIAGNOSIS — D485 Neoplasm of uncertain behavior of skin: Secondary | ICD-10-CM | POA: Diagnosis not present

## 2019-02-25 IMAGING — CT CT ABD-PELV W/ CM
2 of 5 series · 16 of 46 positions shown, 18 images · IV contrast (omnipaque)
Comparison: None.

CLINICAL DATA: 77-year-old male with a history of right lower
quadrant pain

EXAM:
CT ABDOMEN AND PELVIS WITH CONTRAST
TECHNIQUE: Multidetector CT imaging of the abdomen and pelvis was performed
using the standard protocol following bolus administration of
intravenous contrast.
CONTRAST:  80mL OMNIPAQUE IOHEXOL 300 MG/ML  SOLN

[Series 3: abd/ pelvis 5.0 i30f 2 · axial · 0.77mm/px · z∈[+678,+1053]mm · 13 of 85 slices shown, 15 images]
[im 5/85  soft-tissue]
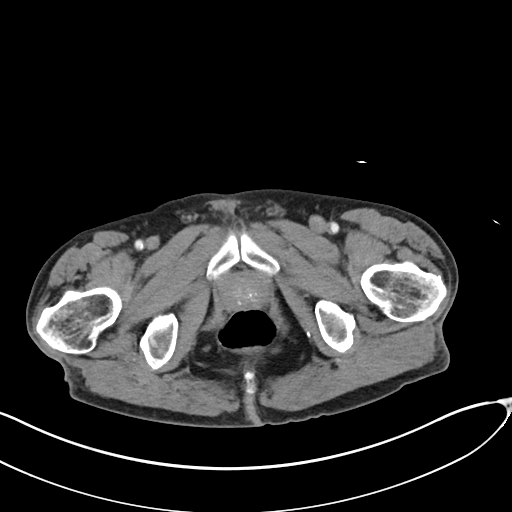
[im 5/85  bone]
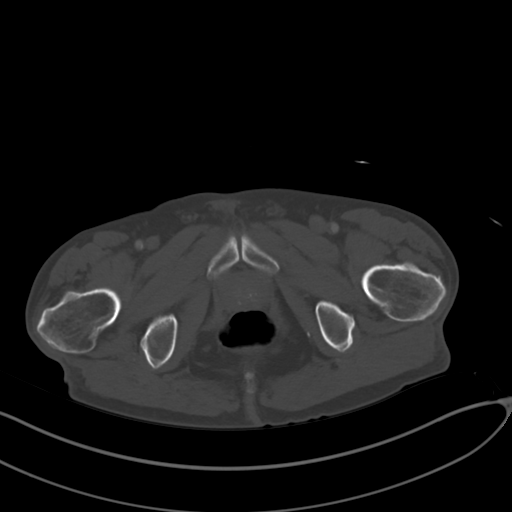
[im 10/85  soft-tissue]
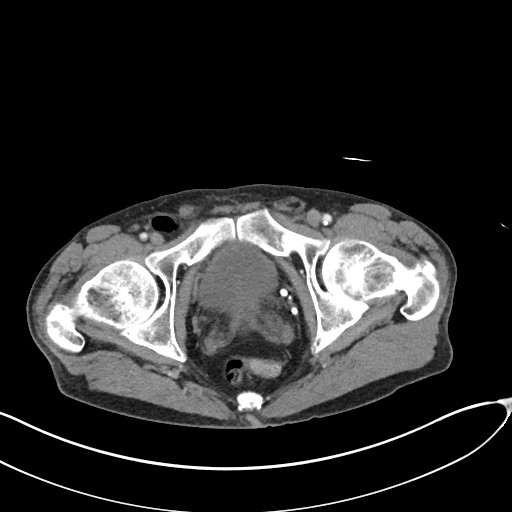
[im 19/85  soft-tissue]
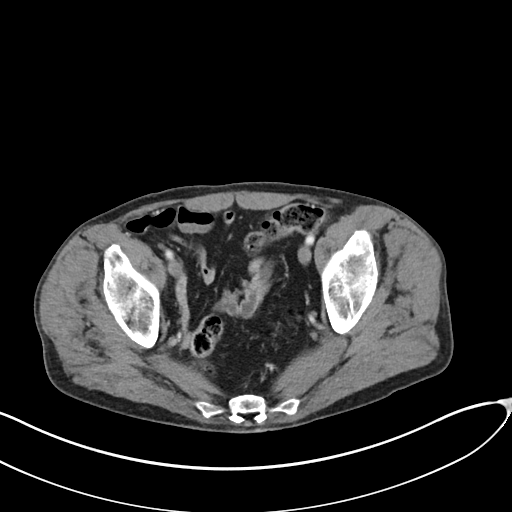
[im 24/85  soft-tissue]
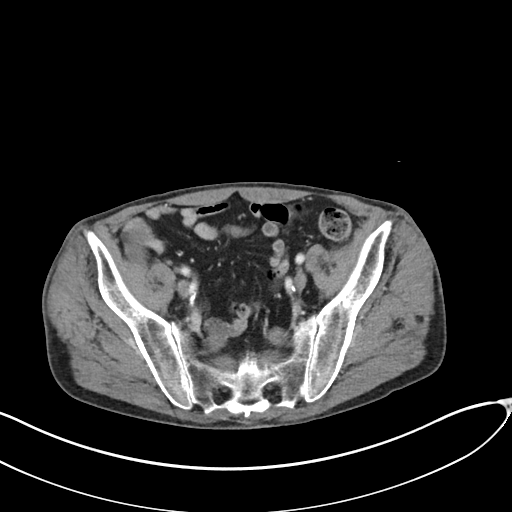
[im 29/85  soft-tissue]
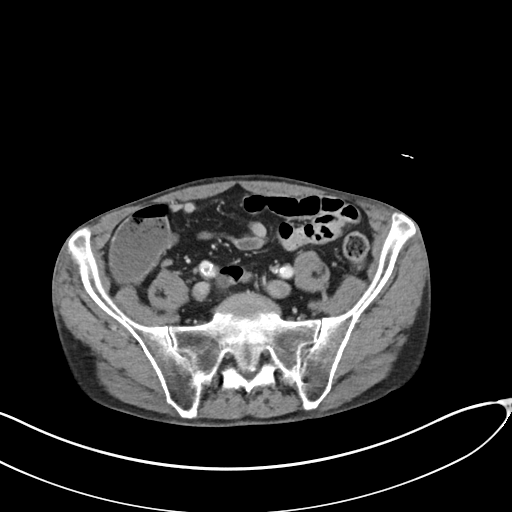
[im 38/85  soft-tissue]
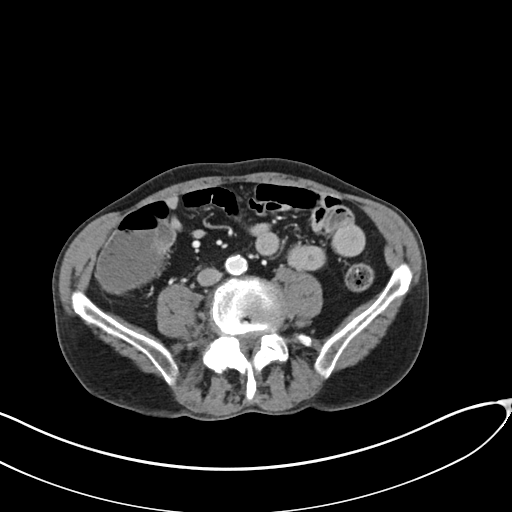
[im 43/85  soft-tissue]
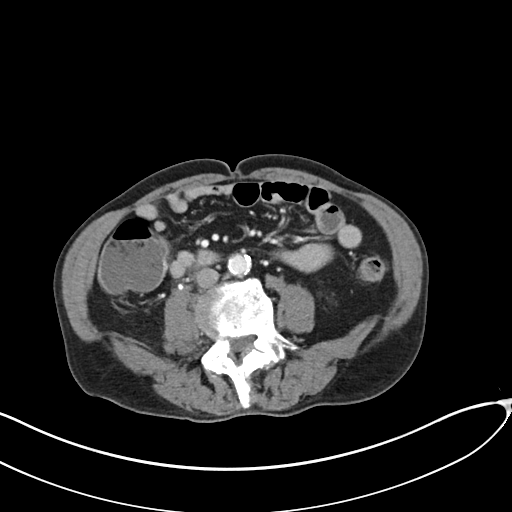
[im 47/85  soft-tissue]
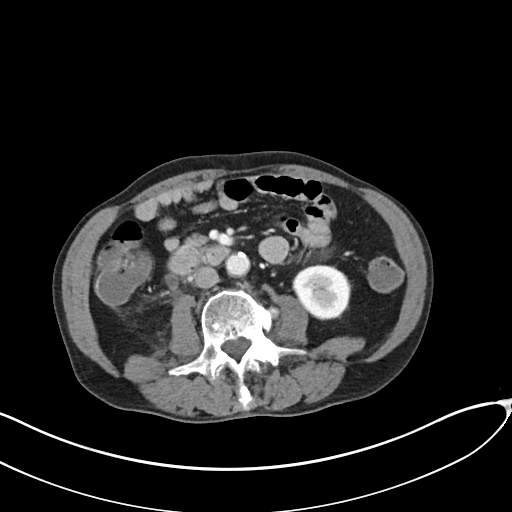
[im 57/85  soft-tissue]
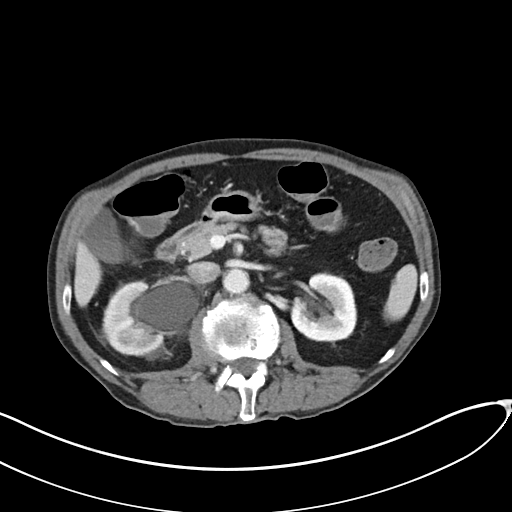
[im 57/85  bone]
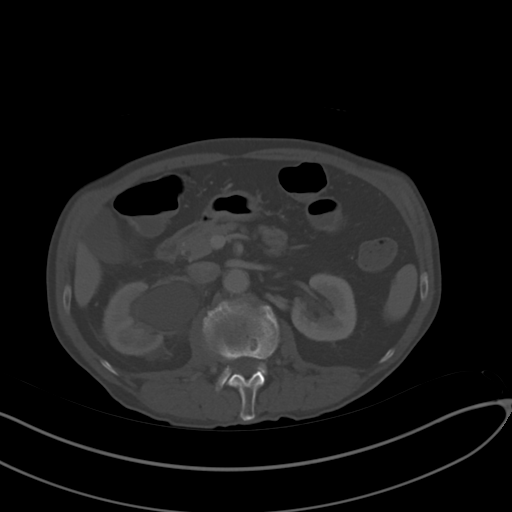
[im 61/85  soft-tissue]
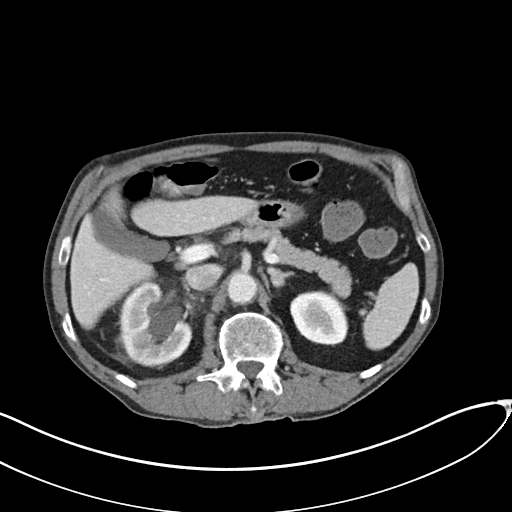
[im 66/85  soft-tissue]
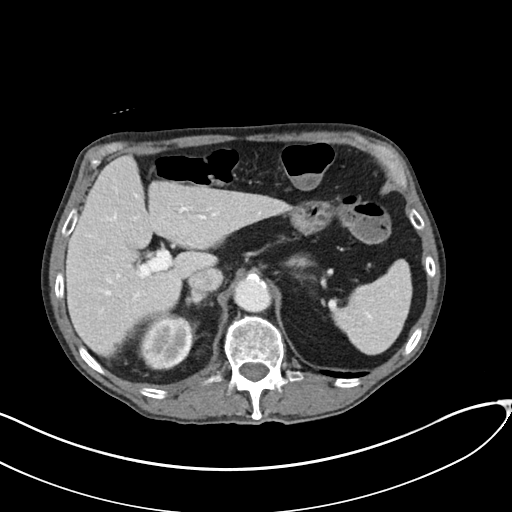
[im 75/85  soft-tissue]
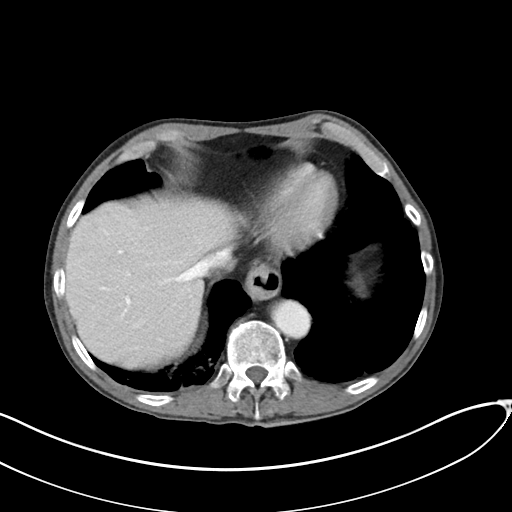
[im 80/85  soft-tissue]
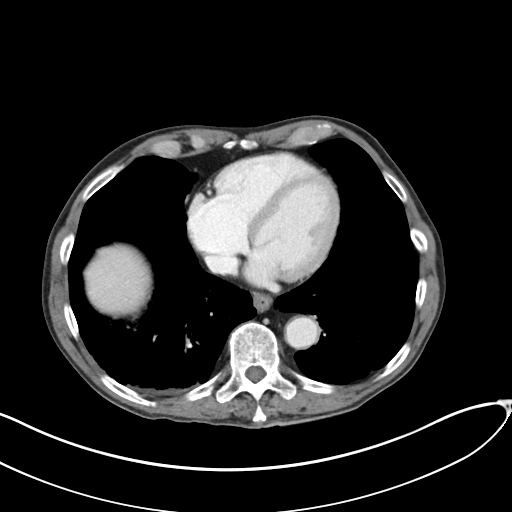

[Series 6: coronal soft tissue · coronal · 0.80mm/px · 3 of 80 slices shown]
[im 27/80  soft-tissue]
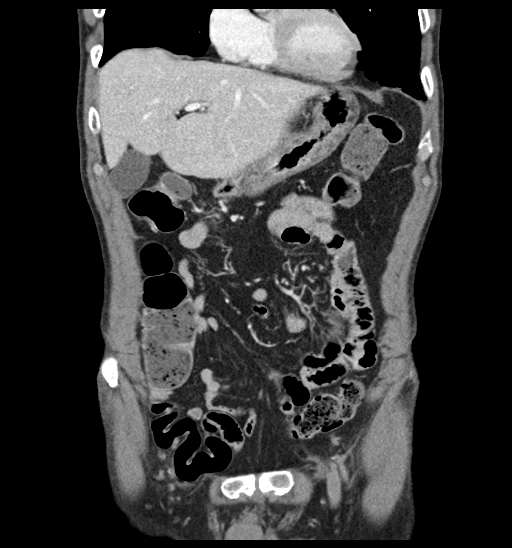
[im 36/80  soft-tissue]
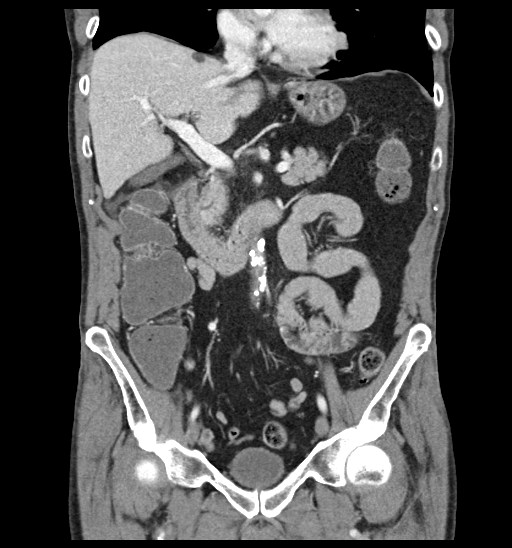
[im 44/80  soft-tissue]
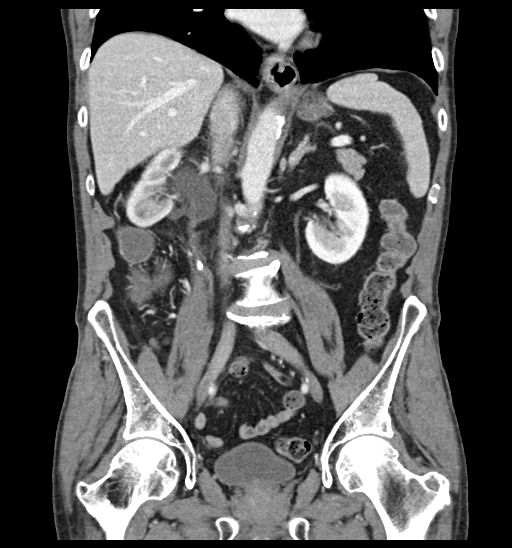

[16 of 46 positions shown; findings below may reference images not displayed]

FINDINGS: Lower chest: Atelectasis/scarring at the bilateral lung bases with
no acute finding.

Hepatobiliary: Small benign cystic structure within segment 8 at the
liver dome, with otherwise unremarkable liver. Unremarkable
gallbladder.

Pancreas: Unremarkable pancreas

Spleen: Unremarkable spleen

Adrenals/Urinary Tract: Unremarkable bilateral adrenal glands.

Right kidney with moderate to severe hydronephrosis and perinephric
stranding. Nonobstructive stone at the lower pole collecting system
measures 4 mm. Dilated right ureter with transition at obstructing 7
mm stone.

Left kidney without hydronephrosis or nephrolithiasis. Unremarkable
course of the left ureter.

Unremarkable urinary bladder.

Stomach/Bowel: Hiatal hernia with otherwise unremarkable stomach.
Unremarkable small bowel. Normal appendix. Minimal diverticular
disease without evidence of acute diverticulitis.

Vascular/Lymphatic: Atherosclerotic calcifications of the abdominal
aorta and the bilateral iliac arteries. Bilateral iliac arteries and
proximal femoral arteries patent. Atherosclerotic changes at the
celiac artery and bilateral renal arteries.

No lymphadenopathy.

Reproductive: Unremarkable pelvic structures.

Other: No large hernia.

Musculoskeletal: No acute displaced fracture. Multilevel
degenerative changes of the thoracolumbar spine. Degenerative
changes of the bilateral hips.
IMPRESSION: Right hydronephrosis secondary to obstructing ureteral stone
measuring 7 mm. If there is concern for ascending urinary tract
infection, recommend correlation with urinalysis.

Aortic Atherosclerosis ([id number]).

Additional findings as above.

## 2019-02-25 IMAGING — RF DG CYSTOGRAM 3+V
1 series · 4 of 4 positions shown · non-contrast
Comparison: 03/04/2018 CT

CLINICAL DATA: Intraoperative fluoroscopy with retrograde cystogram
and stent placement.

EXAM:
CYSTOGRAM
TECHNIQUE: After catheterization of the urinary bladder following sterile
technique the bladder was filled with mL Cysto-Hypaque 30% by drip
infusion. Serial spot images were obtained during bladder filling
and post draining.
FLUOROSCOPY TIME:  Fluoroscopy Time: 13.2  Seconds
Radiation Exposure Index (if provided by the fluoroscopic device):
1.65 mGy
Number of Acquired Spot Images: 4

[Series 1: run · 4 of 4 slices shown]
[im 1/4]
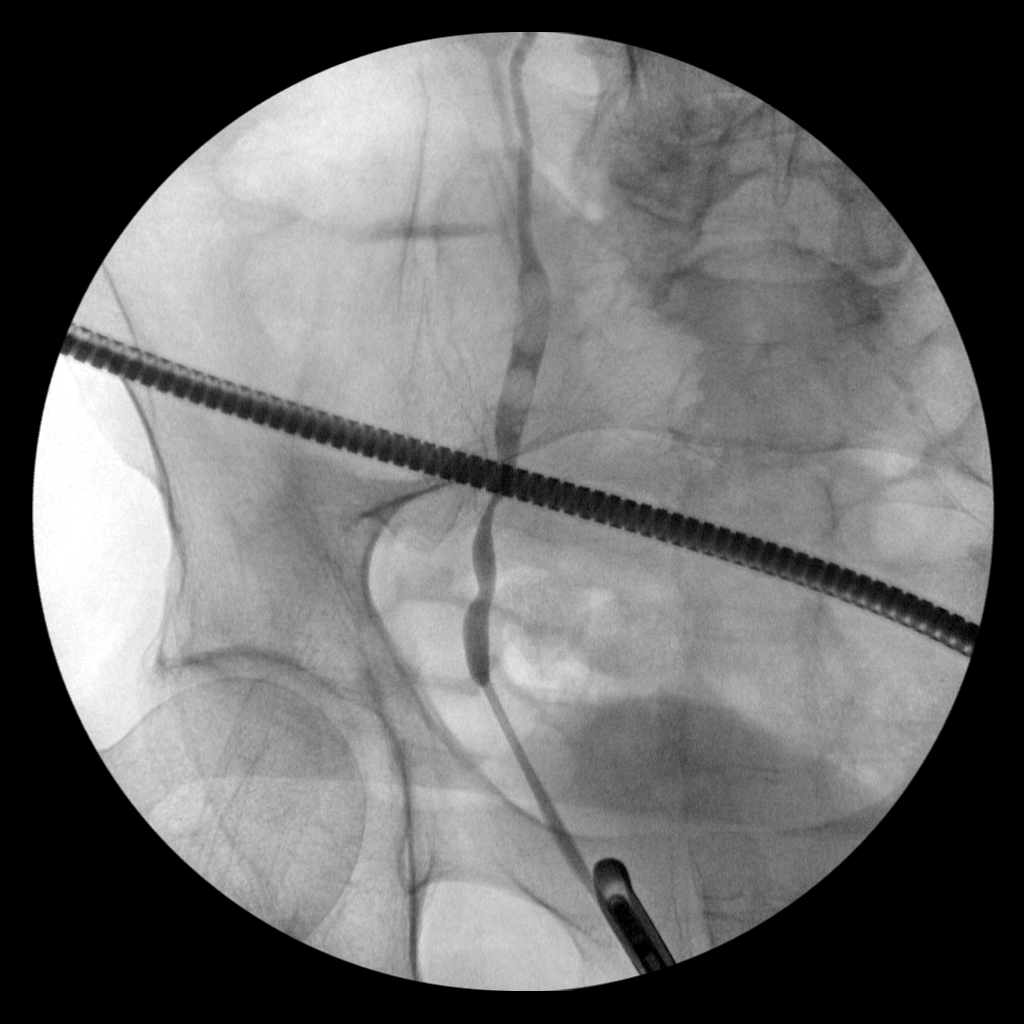
[im 2/4]
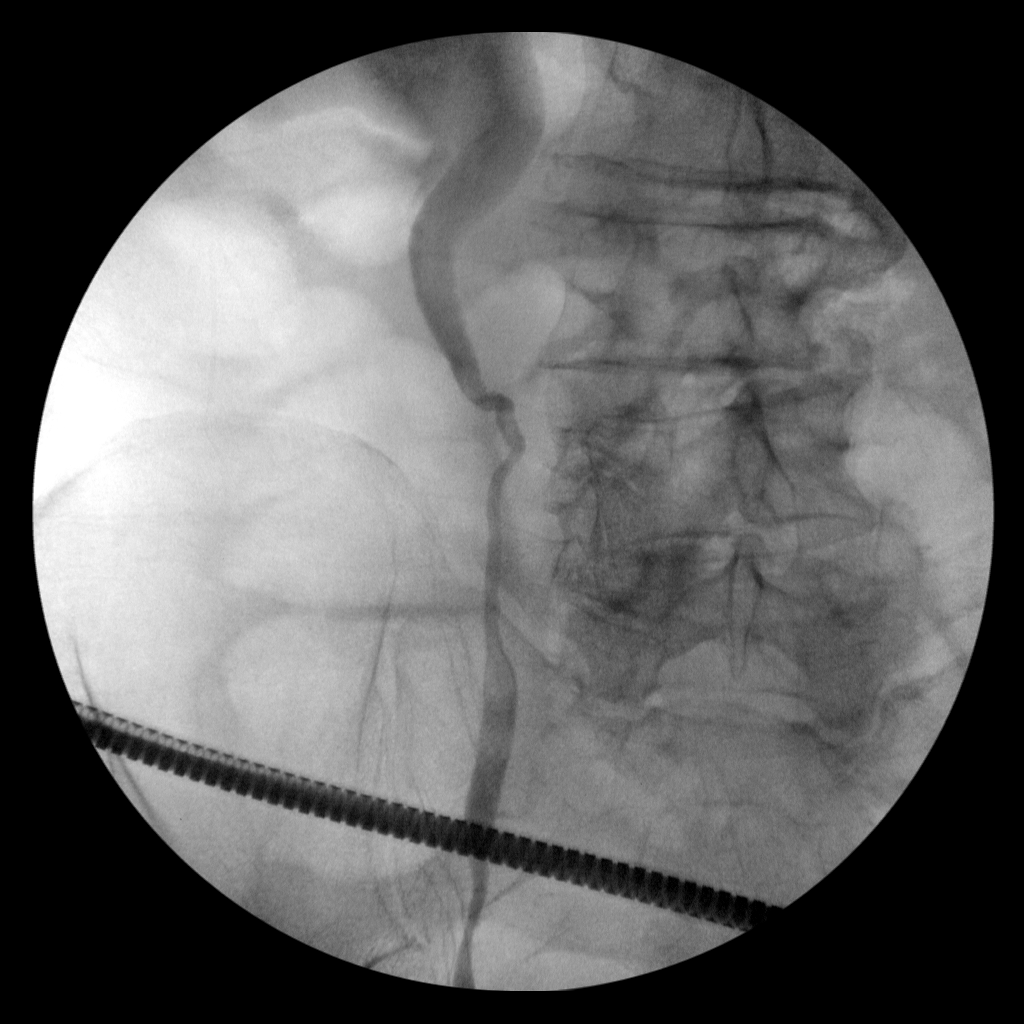
[im 3/4]
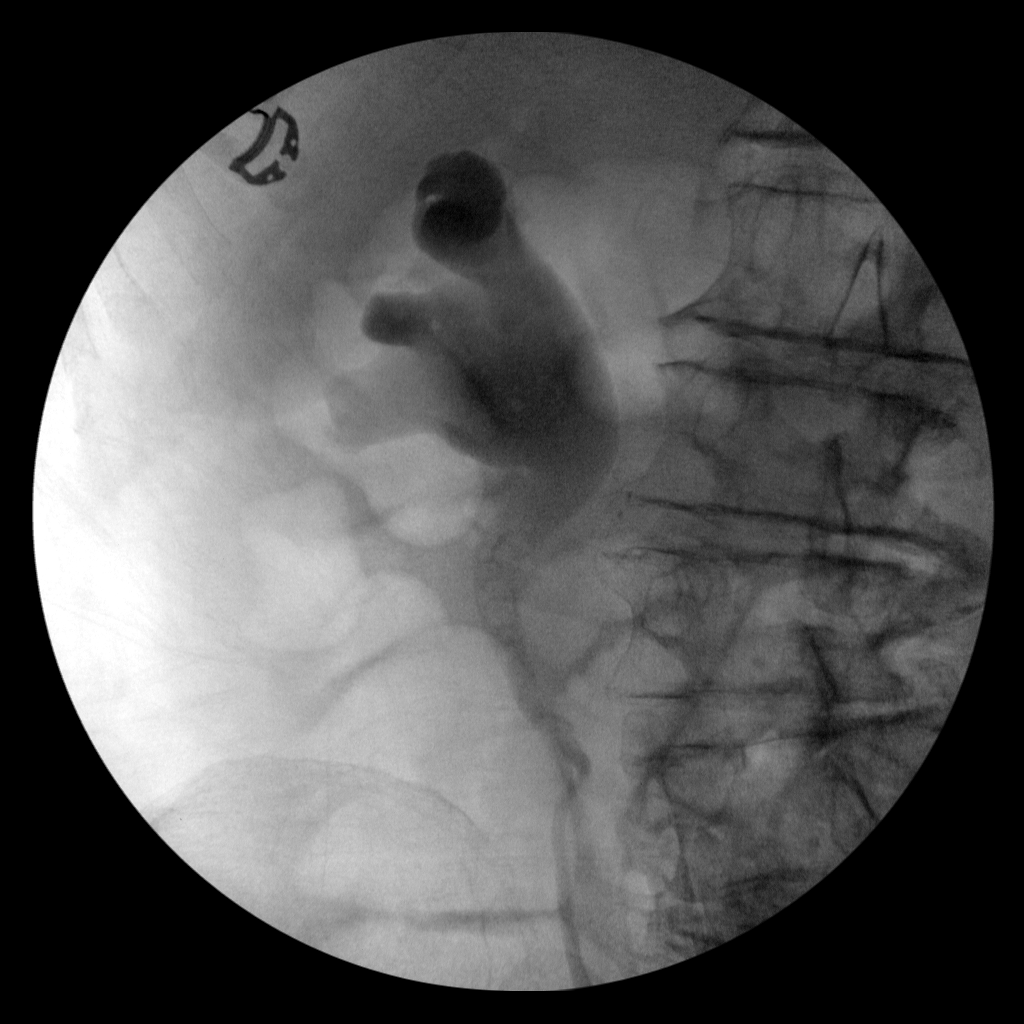
[im 4/4]
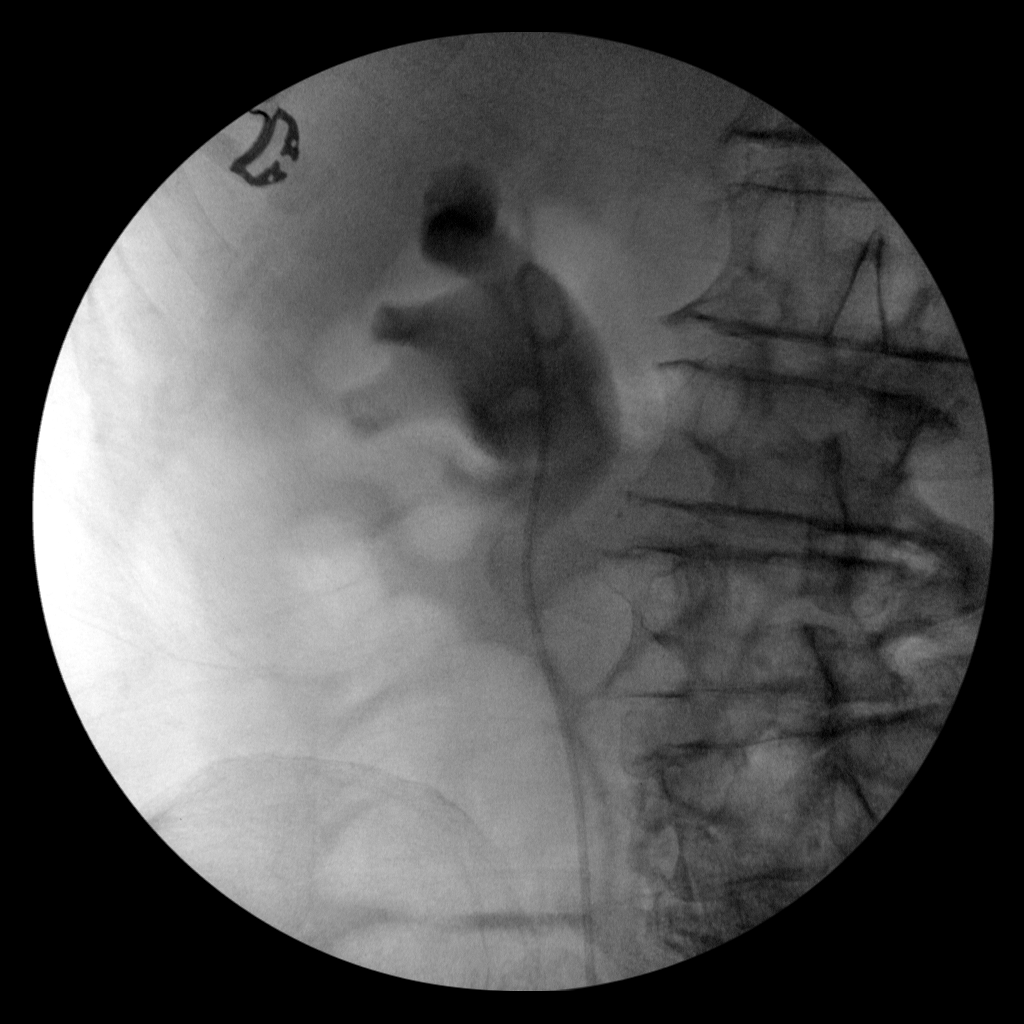

[4 of 4 positions shown; findings below may reference images not displayed]

FINDINGS: Four intraoperative images of the right renal collecting system and
ureter were provided during retrograde pyelogram with stent
placement. Intraluminal filling defects outlined by the contrast are
identified within the ureter more likely to represent air bubbles.
Blunting of the renal calices compatible with a moderate degree of
hydronephrosis is demonstrated. The last image demonstrates the
proximal coil of a right ureteral stent projecting up within the
right renal pelvis adjacent to the base of the upper pole calyx.
IMPRESSION: Fluoroscopic time utilized and retrograde pyelogram procedure
performed of the right kidney demonstrating hydronephrosis.
Subsequent placement of right-sided ureteral stent as above. No
immediate intraoperative complicating features.

## 2019-03-08 DIAGNOSIS — R69 Illness, unspecified: Secondary | ICD-10-CM | POA: Diagnosis not present

## 2019-03-21 ENCOUNTER — Other Ambulatory Visit: Payer: Self-pay | Admitting: Physician Assistant

## 2019-03-23 ENCOUNTER — Encounter: Payer: Self-pay | Admitting: Internal Medicine

## 2019-04-05 DIAGNOSIS — R69 Illness, unspecified: Secondary | ICD-10-CM | POA: Diagnosis not present

## 2019-04-07 ENCOUNTER — Other Ambulatory Visit: Payer: Self-pay | Admitting: Physician Assistant

## 2019-04-07 DIAGNOSIS — R0602 Shortness of breath: Secondary | ICD-10-CM

## 2019-04-07 DIAGNOSIS — J441 Chronic obstructive pulmonary disease with (acute) exacerbation: Secondary | ICD-10-CM

## 2019-04-14 DIAGNOSIS — R69 Illness, unspecified: Secondary | ICD-10-CM | POA: Diagnosis not present

## 2019-04-17 DIAGNOSIS — D0439 Carcinoma in situ of skin of other parts of face: Secondary | ICD-10-CM | POA: Diagnosis not present

## 2019-04-17 DIAGNOSIS — L821 Other seborrheic keratosis: Secondary | ICD-10-CM | POA: Diagnosis not present

## 2019-04-17 DIAGNOSIS — L570 Actinic keratosis: Secondary | ICD-10-CM | POA: Diagnosis not present

## 2019-04-17 DIAGNOSIS — L814 Other melanin hyperpigmentation: Secondary | ICD-10-CM | POA: Diagnosis not present

## 2019-04-17 DIAGNOSIS — B354 Tinea corporis: Secondary | ICD-10-CM | POA: Diagnosis not present

## 2019-05-16 DIAGNOSIS — R69 Illness, unspecified: Secondary | ICD-10-CM | POA: Diagnosis not present

## 2019-05-21 ENCOUNTER — Encounter: Payer: Self-pay | Admitting: Internal Medicine

## 2019-05-21 NOTE — Patient Instructions (Signed)

## 2019-05-21 NOTE — Progress Notes (Signed)
Annual  Screening/Preventative Visit  & Comprehensive Evaluation & Examination     This very nice 78 y.o. DWM  presents for a Screening /Preventative Visit & comprehensive evaluation and management of multiple medical co-morbidities.  Patient has been followed for HTN, HLD, Prediabetes and Vitamin D Deficiency. Patient's GERD is controlled on his meds. Patient also has COPD requiring nebulized bronchodilators. 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      HTN predates circa 1980.  Patient's BP has been controlled at home.  Today's BP is at goal - 126/76.  In 30 at age 58 he had a MI. In 2013, he had a negative Lexiscan. 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 Patient denies any cardiac symptoms as chest pain, palpitations, shortness of breath, dizziness or ankle swelling.     Patient's hyperlipidemia is not controlled with diet and Rosuvastatin. Patient denies myalgias or other medication SE's. Last lipids were not at goal:  Lab Results  Component Value Date   CHOL 224 (H) 02/09/2019   HDL 53 02/09/2019   LDLCALC 127 (H) 02/09/2019   TRIG 287 (H) 02/09/2019   CHOLHDL 4.2 02/09/2019       Patient has hx/o prediabetes (A1c 5.9% / 2011 & 6.4% / 2016)  and patient denies reactive hypoglycemic symptoms, visual blurring, diabetic polys or paresthesias. Last A1c was not at goal:  Lab Results  Component Value Date   HGBA1C 6.1 (H) 02/09/2019        Finally, patient has history of Vitamin D Deficiency ("24" / 2008)  and last vitamin D was at goal:   Lab Results  Component Value Date   VD25OH 71 02/09/2019    Current Outpatient Medications on File Prior to Visit  Medication Sig  . albuterol (PROVENTIL) (2.5 MG/3ML) 0.083% nebulizer solution Inhale 1 vial by Nebulizer 4 x /day or every 4 hr to rescue Asthma  . calcium carbonate (CALCIUM 600) 600 MG TABS tablet Take 1 tablet (600 mg total) by mouth 2 (two) times daily with a meal.  .  nitroGLYCERIN (NITROSTAT) 0.4 MG SL tablet Place 1 tablet (0.4 mg total) under the tongue every 5 (five) minutes x 3 doses as needed for chest pain.  Marland Kitchen olmesartan-hydrochlorothiazide (BENICAR HCT) 20-12.5 MG tablet Take 1 tablet Daily for BP  . omeprazole (PRILOSEC) 40 MG capsule Take one tablet by mouth 30 to 60 minutes before breakfast. (Please schedule a yearly office visit for further refills).  . predniSONE (DELTASONE) 5 MG tablet TAKE 1 TABLET TWICE A DAY OR AS DIRECTED BY DOCTOR  . Respiratory Therapy Supplies (NEBULIZER/TUBING/MOUTHPIECE) KIT Disp one nebulizer machine, tubing set and mouthpiece kit  . rosuvastatin (CRESTOR) 20 MG tablet Take 1 tablet daily for Cholesterol  . VITAMIN D PO Take 5,000 Units by mouth daily.   No current facility-administered medications on file prior to visit.   No Known Allergies   Past Medical History:  Diagnosis Date  . Aortic atherosclerosis ([place]) 03/04/2018   Noted on CT Abd/pelvis  . Cataract    Bilateral  . Colon polyp   . COPD (chronic obstructive pulmonary disease) ([place])   . Coronary artery disease   . Diverticulosis 07/16/2016   SIGMOID, NOTED ON COLONOSCOPY  . Esophageal stenosis 02/24/2018   noted on endoscopy  . Esophageal stricture   . GERD (gastroesophageal reflux disease)   . Grade I diastolic dysfunction 16/57/9038   noted on ECHO  . History of hiatal  hernia 02/24/2018   Small noted on endoscopy  . Hydronephrosis   . Hydronephrosis of right kidney 03/04/2018   URETERAL STONE 7 MM, NOTED ONJ CT ABD/PELVIS  . Hyperlipidemia   . Myocardial infarction ([place])    [id number]  . Nephrolithiasis    s/p stent placement  . Skin cancer   . Traumatic amputation of right leg ([place])   . Ureteral stone 03/04/2018   URETERAL STONE 7 MM. NOTED ON CT ABD/PELVIS  . Vitamin D deficiency   . Wears dentures    upper   Health Maintenance  Topic Date Due  . INFLUENZA VACCINE  01/07/2019  . TETANUS/TDAP  06/29/2021  . PNA vac Low Risk Adult   Completed   Immunization History  Administered Date(s) Administered  . Influenza Split 04/08/2012  . Influenza, High Dose Seasonal PF 03/04/2017  . Influenza,inj,Quad PF,6+ Mos 05/16/2015, 02/15/2018  . Pneumococcal Conjugate-13 07/25/2014  . Pneumococcal Polysaccharide-23 04/08/2012  . Pneumococcal-Unspecified 06/06/2002  . Td 06/30/2011  . Zoster 05/22/2008   Last Colon - 07/16/2016 - Dr Carlean Purl - Recc No repeat due to age  Past Surgical History:  Procedure Laterality Date  . CARDIAC CATHETERIZATION  1983   No intervention performed.   Marland Kitchen CATARACT EXTRACTION, BILATERAL    . COLONOSCOPY    . COLONOSCOPY W/ POLYPECTOMY  08/12/2010   Dr. Silvano Rusk  . COLONOSCOPY W/ POLYPECTOMY  07/16/2016  . CYSTOSCOPY W/ URETERAL STENT PLACEMENT Right 03/04/2018   Procedure: CYSTOSCOPY WITH RETROGRADE PYELOGRAM/URETERAL STENT PLACEMENT;  Surgeon: [name], MD;  Location: [facility];  Service: Urology;  Laterality: Right;  . CYSTOSCOPY/URETEROSCOPY/HOLMIUM LASER/STENT PLACEMENT Right 03/16/2018   Procedure: CYSTOSCOPY RIGHT URETEROSCOPY/HOLMIUM LASER/STENT PLACEMENT;  Surgeon: [name], MD;  Location: [facility];  Service: Urology;  Laterality: Right;  . ESOPHAGOGASTRODUODENOSCOPY    . INGUINAL HERNIA REPAIR     left  . right leg amputation  05/14/1977  . SHOULDER ARTHROSCOPY Left 2000  . UPPER GASTROINTESTINAL ENDOSCOPY  WITH DILATION  02/24/2018   Family History  Problem Relation Age of Onset  . Hypertension Father   . Pancreatic cancer Mother   . Cancer Mother   . Colon cancer Brother   . Leukemia Brother   . Prostate cancer Brother   . Other Brother        polio  . Esophageal cancer Neg Hx   . Rectal cancer Neg Hx   . Stomach cancer Neg Hx    Social History   Socioeconomic History  . Marital status: Divorced  . Number of children: 4  Occupational History  . Occupation: retired  Tobacco Use  . Smoking status: Former Smoker    Packs/day: 3.00     Years: 20.00    Pack years: 60.00    Types: Cigarettes    Quit date: 04/11/2000    Years since quitting: 19.1  . Smokeless tobacco: Never Used  Substance and Sexual Activity  . Alcohol use: Not Currently    Comment: rare  . Drug use: No  . Sexual activity: Not Currently  Social History Narrative   Lives in [place], [place].      ROS Constitutional: Denies fever, chills, weight loss/gain, headaches, insomnia,  night sweats or change in appetite. Does c/o fatigue. Eyes: Denies redness, blurred vision, diplopia, discharge, itchy or watery eyes.  ENT: Denies discharge, congestion, post nasal drip, epistaxis, sore throat, earache, hearing loss, dental pain, Tinnitus, Vertigo, Sinus pain or snoring.  Cardio: Denies chest pain, palpitations,  irregular heartbeat, syncope, dyspnea, diaphoresis, orthopnea, PND, claudication or edema Respiratory: denies cough, dyspnea, DOE, pleurisy, hoarseness, laryngitis or wheezing.  Gastrointestinal: Denies dysphagia, heartburn, reflux, water brash, pain, cramps, nausea, vomiting, bloating, diarrhea, constipation, hematemesis, melena, hematochezia, jaundice or hemorrhoids Genitourinary: Denies dysuria, frequency, urgency, nocturia, hesitancy, discharge, hematuria or flank pain Musculoskeletal: Denies arthralgia, myalgia, stiffness, Jt. Swelling, pain, limp or strain/sprain. Denies Falls. Skin: Denies puritis, rash, hives, warts, acne, eczema or change in skin lesion Neuro: No weakness, tremor, incoordination, spasms, paresthesia or pain Psychiatric: Denies confusion, memory loss or sensory loss. Denies Depression. Endocrine: Denies change in weight, skin, hair change, nocturia, and paresthesia, diabetic polys, visual blurring or hyper / hypo glycemic episodes.  Heme/Lymph: No excessive bleeding, bruising or enlarged lymph nodes.  Physical Exam  BP 126/76   Pulse 72   Temp (!) 97 F (36.1 C)   Resp 16   Ht 5' 8.5" (1.74 m)   Wt 145 lb 12.8 oz (66.1 kg)    BMI 21.85 kg/m   General Appearance: Well nourished and well groomed and in no apparent distress.  Eyes: PERRLA, EOMs, conjunctiva no swelling or erythema, normal fundi and vessels. Sinuses: No frontal/maxillary tenderness ENT/Mouth: EACs patent / TMs  nl. Nares clear without erythema, swelling, mucoid exudates. Oral hygiene is good. No erythema, swelling, or exudate. Tongue normal, non-obstructing. Tonsils not swollen or erythematous. Hearing normal.  Neck: Supple, thyroid not palpable. No bruits, nodes or JVD. Respiratory: Respiratory effort normal.  BS equal and clear bilateral without rales, rhonci, wheezing or stridor. Cardio: Heart sounds are normal with regular rate and rhythm and no murmurs, rubs or gallops. Peripheral pulses are normal and equal bilaterally without edema. No aortic or femoral bruits. Chest: symmetric with normal excursions and percussion.  Abdomen: Soft, with Nl bowel sounds. Nontender, no guarding, rebound, hernias, masses, or organomegaly.  Lymphatics: Non tender without lymphadenopathy.  Musculoskeletal: Full ROM all peripheral extremities, joint stability, 5/5 strength, and normal gait. Skin: Warm and dry without rashes, lesions, cyanosis, clubbing or  ecchymosis.  Neuro: Cranial nerves intact, reflexes equal bilaterally. Normal muscle tone, no cerebellar symptoms. Sensation intact.  Pysch: Alert and oriented X 3 with normal affect, insight and judgment appropriate.   Assessment and Plan  1. Annual Preventative/Screening Exam   1. Encounter for general adult medical examination with abnormal findings   2. Essential hypertension  - EKG 12-Lead - [place], retroperitnl abd,  ltd - Urinalysis, Routine w reflex microscopic - Microalbumin / Creatinine Urine Ratio - CBC with Diff - COMPLETE METABOLIC PANEL WITH GFR - Magnesium - TSH  3. Hyperlipidemia, mixed  - EKG 12-Lead - [place], retroperitnl abd,  ltd - Lipid Profile - TSH  4. Abnormal glucose  - EKG  12-Lead - [place], retroperitnl abd,  ltd - Hemoglobin A1c ([facility]) - Insulin, random  5. Vitamin D deficiency  - Vitamin D (25 hydroxy)  6. Prediabetes   7. ASHD (arteriosclerotic heart disease)   8. Stage 3a chronic kidney disease  - Urinalysis, Routine w reflex microscopic - Microalbumin / Creatinine Urine Ratio - COMPLETE METABOLIC PANEL WITH GFR  9. S/P unilateral BKA (below knee amputation), right ([place])   10. Chronic obstructive pulmonary disease ([place])   11. 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 Prostate cancer screening  - PSA  14. Screening for colorectal cancer  - POC Hemoccult Bld/Stl   15. Screening for ischemic heart disease  -  EKG 12-Lead  16. FH: hypertension  - EKG 12-Lead - [place], retroperitnl abd,  ltd  17. Former sm - EKG 12-Lead - [place], retroperitnl abd,  ltd  18. Screening for AAA (aortic abdominal aneurysm)  - [place], retroperitnl abd,  ltd  19. Medication management  - Urinalysis, Routine w reflex microscopic - Microalbumin / Creatinine Urine Ratio - CBC with Diff - COMPLETE METABOLIC PANEL WITH GFR - Magnesium - Lipid Profile - TSH - Hemoglobin A1c ([facility]) - Insulin, random - Vitamin D (25 hydroxy)        Patient was counseled in prudent diet, weight control to achieve /maintain BMI less than 25, BP monitoring, regular exercise and medications as discussed.  Discussed med effects and SE's. Routine screening labs and tests as requested with regular follow-up as recommended. Over 40 minutes of exam, counseling, chart review and high complex critical decision making was performed   Kirtland Bouchard, MD

## 2019-05-22 ENCOUNTER — Ambulatory Visit (INDEPENDENT_AMBULATORY_CARE_PROVIDER_SITE_OTHER): Payer: Medicare HMO | Admitting: Internal Medicine

## 2019-05-22 ENCOUNTER — Other Ambulatory Visit: Payer: Self-pay

## 2019-05-22 VITALS — BP 126/76 | HR 72 | Temp 97.0°F | Resp 16 | Ht 68.5 in | Wt 145.8 lb

## 2019-05-22 DIAGNOSIS — Z0000 Encounter for general adult medical examination without abnormal findings: Secondary | ICD-10-CM | POA: Diagnosis not present

## 2019-05-22 DIAGNOSIS — E782 Mixed hyperlipidemia: Secondary | ICD-10-CM

## 2019-05-22 DIAGNOSIS — K219 Gastro-esophageal reflux disease without esophagitis: Secondary | ICD-10-CM

## 2019-05-22 DIAGNOSIS — I2510 Atherosclerotic heart disease of native coronary artery without angina pectoris: Secondary | ICD-10-CM

## 2019-05-22 DIAGNOSIS — Z89511 Acquired absence of right leg below knee: Secondary | ICD-10-CM

## 2019-05-22 DIAGNOSIS — N138 Other obstructive and reflux uropathy: Secondary | ICD-10-CM | POA: Diagnosis not present

## 2019-05-22 DIAGNOSIS — R7309 Other abnormal glucose: Secondary | ICD-10-CM

## 2019-05-22 DIAGNOSIS — N1831 Chronic kidney disease, stage 3a: Secondary | ICD-10-CM

## 2019-05-22 DIAGNOSIS — Z1211 Encounter for screening for malignant neoplasm of colon: Secondary | ICD-10-CM

## 2019-05-22 DIAGNOSIS — Z136 Encounter for screening for cardiovascular disorders: Secondary | ICD-10-CM

## 2019-05-22 DIAGNOSIS — Z87891 Personal history of nicotine dependence: Secondary | ICD-10-CM

## 2019-05-22 DIAGNOSIS — Z8249 Family history of ischemic heart disease and other diseases of the circulatory system: Secondary | ICD-10-CM | POA: Diagnosis not present

## 2019-05-22 DIAGNOSIS — Z0001 Encounter for general adult medical examination with abnormal findings: Secondary | ICD-10-CM

## 2019-05-22 DIAGNOSIS — Z79899 Other long term (current) drug therapy: Secondary | ICD-10-CM | POA: Diagnosis not present

## 2019-05-22 DIAGNOSIS — Z125 Encounter for screening for malignant neoplasm of prostate: Secondary | ICD-10-CM | POA: Diagnosis not present

## 2019-05-22 DIAGNOSIS — I10 Essential (primary) hypertension: Secondary | ICD-10-CM

## 2019-05-22 DIAGNOSIS — R7303 Prediabetes: Secondary | ICD-10-CM

## 2019-05-22 DIAGNOSIS — N401 Enlarged prostate with lower urinary tract symptoms: Secondary | ICD-10-CM

## 2019-05-22 DIAGNOSIS — Z1212 Encounter for screening for malignant neoplasm of rectum: Secondary | ICD-10-CM

## 2019-05-22 DIAGNOSIS — E559 Vitamin D deficiency, unspecified: Secondary | ICD-10-CM

## 2019-05-22 DIAGNOSIS — J449 Chronic obstructive pulmonary disease, unspecified: Secondary | ICD-10-CM

## 2019-05-23 LAB — LIPID PANEL
Cholesterol: 231 mg/dL — ABNORMAL HIGH (ref <200)
HDL: 44 mg/dL (ref >=40)
LDL Cholesterol (Calc): 147 mg/dL (calc) — ABNORMAL HIGH
Non-HDL Cholesterol (Calc): 187 mg/dL (calc) — ABNORMAL HIGH (ref <130)
Total CHOL/HDL Ratio: 5.3 (calc) — ABNORMAL HIGH (ref <5.0)
Triglycerides: 261 mg/dL — ABNORMAL HIGH (ref <150)

## 2019-05-23 LAB — COMPLETE METABOLIC PANEL WITH GFR
AG Ratio: 1.7 (calc) (ref 1.0–2.5)
ALT: 13 U/L (ref 9–46)
AST: 15 U/L (ref 10–35)
Albumin: 3.8 g/dL (ref 3.6–5.1)
Alkaline phosphatase (APISO): 56 U/L (ref 35–144)
BUN/Creatinine Ratio: 10 (calc) (ref 6–22)
BUN: 14 mg/dL (ref 7–25)
CO2: 29 mmol/L (ref 20–32)
Calcium: 9.6 mg/dL (ref 8.6–10.3)
Chloride: 105 mmol/L (ref 98–110)
Creat: 1.39 mg/dL — ABNORMAL HIGH (ref 0.70–1.18)
GFR, Est African American: 55 mL/min/{1.73_m2} — ABNORMAL LOW (ref >=60)
GFR, Est Non African American: 48 mL/min/{1.73_m2} — ABNORMAL LOW (ref >=60)
Globulin: 2.2 g/dL (calc) (ref 1.9–3.7)
Glucose, Bld: 83 mg/dL (ref 65–99)
Potassium: 4.4 mmol/L (ref 3.5–5.3)
Sodium: 141 mmol/L (ref 135–146)
Total Bilirubin: 0.4 mg/dL (ref 0.2–1.2)
Total Protein: 6 g/dL — ABNORMAL LOW (ref 6.1–8.1)

## 2019-05-23 LAB — URINALYSIS, ROUTINE W REFLEX MICROSCOPIC
Bilirubin Urine: NEGATIVE
Glucose, UA: NEGATIVE
Hgb urine dipstick: NEGATIVE
Ketones, ur: NEGATIVE
Leukocytes,Ua: NEGATIVE
Nitrite: NEGATIVE
Protein, ur: NEGATIVE
Specific Gravity, Urine: 1.024 (ref 1.001–1.03)
pH: 5.5 (ref 5.0–8.0)

## 2019-05-23 LAB — CBC WITH DIFFERENTIAL/PLATELET
Absolute Monocytes: 931 cells/uL (ref 200–950)
Basophils Absolute: 68 cells/uL (ref 0–200)
Basophils Relative: 0.7 %
Eosinophils Absolute: 78 cells/uL (ref 15–500)
Eosinophils Relative: 0.8 %
HCT: 39.7 % (ref 38.5–50.0)
Hemoglobin: 13.4 g/dL (ref 13.2–17.1)
Lymphs Abs: 892 cells/uL (ref 850–3900)
MCH: 33 pg (ref 27.0–33.0)
MCHC: 33.8 g/dL (ref 32.0–36.0)
MCV: 97.8 fL (ref 80.0–100.0)
MPV: 8.9 fL (ref 7.5–12.5)
Monocytes Relative: 9.6 %
Neutro Abs: 7731 cells/uL (ref 1500–7800)
Neutrophils Relative %: 79.7 %
Platelets: 349 10*3/uL (ref 140–400)
RBC: 4.06 10*6/uL — ABNORMAL LOW (ref 4.20–5.80)
RDW: 12.9 % (ref 11.0–15.0)
Total Lymphocyte: 9.2 %
WBC: 9.7 10*3/uL (ref 3.8–10.8)

## 2019-05-23 LAB — VITAMIN D 25 HYDROXY (VIT D DEFICIENCY, FRACTURES): Vit D, 25-Hydroxy: 70 ng/mL (ref 30–100)

## 2019-05-23 LAB — MICROALBUMIN / CREATININE URINE RATIO
Creatinine, Urine: 224 mg/dL (ref 20–320)
Microalb Creat Ratio: 2 mcg/mg creat (ref <30)
Microalb, Ur: 0.5 mg/dL

## 2019-05-23 LAB — HEMOGLOBIN A1C
Hgb A1c MFr Bld: 6.1 % of total Hgb — ABNORMAL HIGH (ref <5.7)
Mean Plasma Glucose: 128 (calc)
eAG (mmol/L): 7.1 (calc)

## 2019-05-23 LAB — INSULIN, RANDOM: Insulin: 6.8 u[IU]/mL

## 2019-05-23 LAB — MAGNESIUM: Magnesium: 1.9 mg/dL (ref 1.5–2.5)

## 2019-05-23 LAB — TSH: TSH: 1.15 mIU/L (ref 0.40–4.50)

## 2019-05-23 LAB — PSA: PSA: 1.7 ng/mL (ref <=4.0)

## 2019-05-27 ENCOUNTER — Other Ambulatory Visit: Payer: Self-pay | Admitting: Internal Medicine

## 2019-05-27 DIAGNOSIS — R69 Illness, unspecified: Secondary | ICD-10-CM | POA: Diagnosis not present

## 2019-05-27 DIAGNOSIS — I10 Essential (primary) hypertension: Secondary | ICD-10-CM

## 2019-06-14 DIAGNOSIS — H903 Sensorineural hearing loss, bilateral: Secondary | ICD-10-CM | POA: Diagnosis not present

## 2019-06-14 DIAGNOSIS — H906 Mixed conductive and sensorineural hearing loss, bilateral: Secondary | ICD-10-CM | POA: Diagnosis not present

## 2019-06-17 ENCOUNTER — Other Ambulatory Visit: Payer: Self-pay | Admitting: Internal Medicine

## 2019-06-17 DIAGNOSIS — J449 Chronic obstructive pulmonary disease, unspecified: Secondary | ICD-10-CM

## 2019-06-21 ENCOUNTER — Other Ambulatory Visit: Payer: Self-pay | Admitting: Internal Medicine

## 2019-06-21 MED ORDER — TADALAFIL 20 MG PO TABS: ORAL_TABLET | ORAL | 11 refills | Status: DC

## 2019-08-01 ENCOUNTER — Ambulatory Visit: Payer: Self-pay | Admitting: Adult Health Nurse Practitioner

## 2019-08-22 ENCOUNTER — Ambulatory Visit: Payer: Self-pay | Admitting: Adult Health Nurse Practitioner

## 2019-08-29 ENCOUNTER — Ambulatory Visit (INDEPENDENT_AMBULATORY_CARE_PROVIDER_SITE_OTHER): Payer: Medicare HMO | Admitting: Adult Health Nurse Practitioner

## 2019-08-29 ENCOUNTER — Encounter: Payer: Self-pay | Admitting: Adult Health Nurse Practitioner

## 2019-08-29 ENCOUNTER — Other Ambulatory Visit: Payer: Self-pay

## 2019-08-29 VITALS — BP 126/80 | HR 100 | Temp 97.7°F | Ht 68.0 in | Wt 145.0 lb

## 2019-08-29 DIAGNOSIS — K219 Gastro-esophageal reflux disease without esophagitis: Secondary | ICD-10-CM

## 2019-08-29 DIAGNOSIS — Z0001 Encounter for general adult medical examination with abnormal findings: Secondary | ICD-10-CM | POA: Diagnosis not present

## 2019-08-29 DIAGNOSIS — Z89511 Acquired absence of right leg below knee: Secondary | ICD-10-CM

## 2019-08-29 DIAGNOSIS — Z79899 Other long term (current) drug therapy: Secondary | ICD-10-CM

## 2019-08-29 DIAGNOSIS — R7309 Other abnormal glucose: Secondary | ICD-10-CM

## 2019-08-29 DIAGNOSIS — N401 Enlarged prostate with lower urinary tract symptoms: Secondary | ICD-10-CM | POA: Diagnosis not present

## 2019-08-29 DIAGNOSIS — E559 Vitamin D deficiency, unspecified: Secondary | ICD-10-CM | POA: Diagnosis not present

## 2019-08-29 DIAGNOSIS — J449 Chronic obstructive pulmonary disease, unspecified: Secondary | ICD-10-CM | POA: Diagnosis not present

## 2019-08-29 DIAGNOSIS — E782 Mixed hyperlipidemia: Secondary | ICD-10-CM | POA: Diagnosis not present

## 2019-08-29 DIAGNOSIS — N1831 Chronic kidney disease, stage 3a: Secondary | ICD-10-CM

## 2019-08-29 DIAGNOSIS — I10 Essential (primary) hypertension: Secondary | ICD-10-CM | POA: Diagnosis not present

## 2019-08-29 DIAGNOSIS — R6889 Other general symptoms and signs: Secondary | ICD-10-CM

## 2019-08-29 DIAGNOSIS — J441 Chronic obstructive pulmonary disease with (acute) exacerbation: Secondary | ICD-10-CM | POA: Diagnosis not present

## 2019-08-29 DIAGNOSIS — Z6822 Body mass index (BMI) 22.0-22.9, adult: Secondary | ICD-10-CM

## 2019-08-29 DIAGNOSIS — N138 Other obstructive and reflux uropathy: Secondary | ICD-10-CM

## 2019-08-29 DIAGNOSIS — Z0000 Encounter for general adult medical examination without abnormal findings: Secondary | ICD-10-CM

## 2019-08-29 LAB — CBC WITH DIFFERENTIAL/PLATELET
Absolute Monocytes: 626 cells/uL (ref 200–950)
Basophils Absolute: 58 cells/uL (ref 0–200)
Basophils Relative: 0.5 %
Eosinophils Absolute: 12 cells/uL — ABNORMAL LOW (ref 15–500)
Eosinophils Relative: 0.1 %
HCT: 41 % (ref 38.5–50.0)
Hemoglobin: 14 g/dL (ref 13.2–17.1)
Lymphs Abs: 557 cells/uL — ABNORMAL LOW (ref 850–3900)
MCH: 32.9 pg (ref 27.0–33.0)
MCHC: 34.1 g/dL (ref 32.0–36.0)
MCV: 96.2 fL (ref 80.0–100.0)
MPV: 9.1 fL (ref 7.5–12.5)
Monocytes Relative: 5.4 %
Neutro Abs: 10347 cells/uL — ABNORMAL HIGH (ref 1500–7800)
Neutrophils Relative %: 89.2 %
Platelets: 398 10*3/uL (ref 140–400)
RBC: 4.26 10*6/uL (ref 4.20–5.80)
RDW: 13.2 % (ref 11.0–15.0)
Total Lymphocyte: 4.8 %
WBC: 11.6 10*3/uL — ABNORMAL HIGH (ref 3.8–10.8)

## 2019-08-29 LAB — LIPID PANEL
Cholesterol: 221 mg/dL — ABNORMAL HIGH (ref <200)
HDL: 49 mg/dL (ref >=40)
LDL Cholesterol (Calc): 144 mg/dL (calc) — ABNORMAL HIGH
Non-HDL Cholesterol (Calc): 172 mg/dL (calc) — ABNORMAL HIGH (ref <130)
Total CHOL/HDL Ratio: 4.5 (calc) (ref <5.0)
Triglycerides: 146 mg/dL (ref <150)

## 2019-08-29 LAB — COMPLETE METABOLIC PANEL WITH GFR
AG Ratio: 1.9 (calc) (ref 1.0–2.5)
ALT: 16 U/L (ref 9–46)
AST: 21 U/L (ref 10–35)
Albumin: 4.2 g/dL (ref 3.6–5.1)
Alkaline phosphatase (APISO): 55 U/L (ref 35–144)
BUN/Creatinine Ratio: 15 (calc) (ref 6–22)
BUN: 18 mg/dL (ref 7–25)
CO2: 27 mmol/L (ref 20–32)
Calcium: 10.1 mg/dL (ref 8.6–10.3)
Chloride: 102 mmol/L (ref 98–110)
Creat: 1.23 mg/dL — ABNORMAL HIGH (ref 0.70–1.18)
GFR, Est African American: 64 mL/min/{1.73_m2} (ref >=60)
GFR, Est Non African American: 55 mL/min/{1.73_m2} — ABNORMAL LOW (ref >=60)
Globulin: 2.2 g/dL (calc) (ref 1.9–3.7)
Glucose, Bld: 96 mg/dL (ref 65–99)
Potassium: 5.2 mmol/L (ref 3.5–5.3)
Sodium: 137 mmol/L (ref 135–146)
Total Bilirubin: 0.3 mg/dL (ref 0.2–1.2)
Total Protein: 6.4 g/dL (ref 6.1–8.1)

## 2019-08-29 MED ORDER — ALBUTEROL SULFATE HFA 108 (90 BASE) MCG/ACT IN AERS: 2.0000 | INHALATION_SPRAY | RESPIRATORY_TRACT | 2 refills | Status: DC | PRN

## 2019-08-29 MED ORDER — PREDNISONE 5 MG PO TABS: ORAL_TABLET | ORAL | 1 refills | Status: DC

## 2019-08-29 NOTE — Patient Instructions (Addendum)
   We will contact you in 1-3 days with your lab results via [facility].  We have sent in a hand held inhaler for for you to use when you are short of breath.  IF you are having to use your inhaler three times a day or more please let us know.  We may need to change your regiment.  Try using Breztri inhaler, 2 inhalations, twice a day.  Rinse your mouth with water and spit after using.  This is a 14 day sample.    Leslie Cardenas , Thank you for taking time to come for your Medicare Wellness Visit. I appreciate your ongoing commitment to your health goals. Please review the following plan we discussed and let me know if I can assist you in the future.    This is a list of the screening recommended for you and due dates:  Health Maintenance  Topic Date Due  . Flu Shot  01/07/2019  . Tetanus Vaccine  06/29/2021  . Pneumonia vaccines  Completed   Your health maintenance is up to date.   Look over the Advanced directives.  Consider getting [place] and Living Will established so your family knows your wishes.

## 2019-08-29 NOTE — Progress Notes (Signed)
MEDICARE ANNUAL WELLNESS VISIT AND 3 MONTH FOLLOW UP  Assessment:   Keonta was seen today for follow-up and medicare wellness.  Diagnoses and all orders for this visit:  Medicare annual wellness visit, subsequent Yearly  Essential hypertension Continue current medications: Olmesartan / HCTZ 20/12.58m Monitor blood pressure at home; call if consistently over 130/80 Continue DASH diet.   Reminder to go to the ER if any CP, SOB, nausea, dizziness, severe HA, changes vision/speech, left arm numbness and tingling and jaw pain. -     CBC with Differential/Platelet -     COMPLETE METABOLIC PANEL WITH GFR  Hyperlipidemia, mixed Continue medications: Rosuvastatin 245mdaily Discussed dietary and exercise modifications Low fat diet -     Lipid panel  Abnormal glucose Discussed dietary and exercise modifications  Vitamin D deficiency Continue supplementation Taking Vitamin D 5,000 IU daily, two tablets daily At goal defer lab today  Stage 3a chronic kidney disease Increase fluids  Avoid NSAIDS Blood pressure control Monitor sugars  Will continue to monitor  Gastroesophageal reflux disease without esophagitis Doing well at this time Continue: prilosec 4054maily Diet discussed Monitor for triggers Avoid food with high acid content Avoid excessive cafeine Increase water intake  S/P unilateral BKA (below knee amputation), right (HCC) Doing well at this time  Chronic obstructive pulmonary disease, unspecified COPD type (HCC) -     predniSONE (DELTASONE) 5 MG tablet; Take 1 tablet 2 x day as directed for COPD  COPD with acute exacerbation (HCC) -     albuterol (VENTOLIN HFA) 108 (90 Base) MCG/ACT inhaler; Inhale 2 puffs into the lungs every 4 (four) hours as needed for wheezing or shortness of breath. Make sure you rinse mouth out after each use. -Addendum: Rx Azithromyacin, two tablets toady and one tablet daily until complete. Will follow up in two weeks in office.   BPH  with obstruction/lower urinary tract symptoms Doing well at this time Continue to monitor  BMI 22.0-22.9 Discussed dietary and exercise modifications  Medication management Continued   Over 40 minutes of face to face interview, exam, counseling, chart review, and critical decision making was performed.  Future Appointments  Date Time Provider DepCarrizo/01/2020 10:30 AM McCGarnet SierrasP [facility] None  11/28/2019  9:30 AM McKUnk PintoD [facility] None  06/06/2020 10:00 AM McKUnk PintoD [facility] None     Plan:   During the course of the visit the patient was educated and counseled about appropriate screening and preventive services including:    Pneumococcal vaccine   Influenza vaccine  Prevnar 13  Td vaccine  Screening electrocardiogram  Colorectal cancer screening  Diabetes screening  Glaucoma screening  Nutrition counseling    Subjective:  Leslie Cardenas a [age]o. male who presents for Medicare Annual Wellness Visit and 3 month follow up for HTN,HLD, prediabetes, CKDIII, COPD GERD and vitamin D Def.   Reports overall he is doing well.  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  He does report some exertional fatigue but contributes this to his age.  His blood pressure has been controlled at home, today their BP is BP: 126/80 He does workout by means of yard and housework. He denies chest pain, shortness of breath, dizziness.  He is not on cholesterol medication and denies myalgias. His cholesterol is not at goal. The cholesterol last visit was:   Lab Results  Component Value Date   CHOL 231 (H) 05/22/2019   HDL 44 05/22/2019  LDLCALC 147 (H) 05/22/2019   TRIG 261 (H) 05/22/2019   CHOLHDL 5.3 (H) 05/22/2019   He has been working on diet and exercise for prediabetes, and denies hyperglycemia, hypoglycemia , increased appetite, nausea, polydipsia and polyuria. Last A1C in the office was:  Lab Results   Component Value Date   HGBA1C 6.1 (H) 05/22/2019   Last GFR Lab Results  Component Value Date   GFRNONAA 48 (L) 05/22/2019     Lab Results  Component Value Date   GFRAA 55 (L) 05/22/2019   Patient is on Vitamin D supplement.   Lab Results  Component Value Date   VD25OH 70 05/22/2019      Medication Review:  Current Outpatient Medications (Endocrine & Metabolic):  .  predniSONE (DELTASONE) 5 MG tablet, Take 1 tablet 2 x /day as directed for COPD  Current Outpatient Medications (Cardiovascular):  .  nitroGLYCERIN (NITROSTAT) 0.4 MG SL tablet, Place 1 tablet (0.4 mg total) under the tongue every 5 (five) minutes x 3 doses as needed for chest pain. Marland Kitchen  olmesartan-hydrochlorothiazide (BENICAR HCT) 20-12.5 MG tablet, Take 1 tablet Daily for BP .  rosuvastatin (CRESTOR) 20 MG tablet, Take 1 tablet daily for Cholesterol .  tadalafil (CIALIS) 20 MG tablet, Take 1/2 to 1 tablet every 2 to 3 days if needed for XXXX  Current Outpatient Medications (Respiratory):  .  albuterol (PROVENTIL) (2.5 MG/3ML) 0.083% nebulizer solution, Inhale 1 vial by Nebulizer 4 x /day or every 4 hr to rescue Asthma .  albuterol (VENTOLIN HFA) 108 (90 Base) MCG/ACT inhaler, Inhale 2 puffs into the lungs every 4 (four) hours as needed for wheezing or shortness of breath. Make sure you rinse mouth out after each use.    Current Outpatient Medications (Other):  .  calcium carbonate (CALCIUM 600) 600 MG TABS tablet, Take 1 tablet (600 mg total) by mouth 2 (two) times daily with a meal. .  omeprazole (PRILOSEC) 40 MG capsule, Take one tablet by mouth 30 to 60 minutes before breakfast. (Please schedule a yearly office visit for further refills). Marland Kitchen  Respiratory Therapy Supplies (NEBULIZER/TUBING/MOUTHPIECE) KIT, Disp one nebulizer machine, tubing set and mouthpiece kit .  VITAMIN D PO, Take 5,000 Units by mouth daily. Marland Kitchen  azithromycin (ZITHROMAX) 250 MG tablet, Take 2 tablets (500 mg) on  Day 1,  followed by 1 tablet  (250 mg) once daily on Days 2 through 5.  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  Screening Tests Immunization History  Administered Date(s) Administered  . Influenza Split 04/08/2012  . Influenza, High Dose Seasonal PF 03/04/2017  . Influenza,inj,Quad PF,6+ Mos 05/16/2015, 02/15/2018  . Pneumococcal Conjugate-13 07/25/2014  . Pneumococcal Polysaccharide-23 04/08/2012  . Pneumococcal-Unspecified 06/06/2002  . Td 06/30/2011  . Zoster 05/22/2008    Preventative care: Last colonoscopy: 2018  Prior vaccinations: TD or Tdap: 2013  Influenza: 2019 Pneumococcal: 2013 Prevnar13: 2016 Shingles/Zostavax: 2009  Names of Other Physician/Practitioners you currently use: 1. [facility] here for primary care 2.Eye Exam [place] 2020 3. Dentist, has dentures.  Fit well, no issues.  Patient Care Team: Unk Pinto, MD as PCP - General (Internal Medicine) Luberta Mutter, MD as Consulting Physician (Ophthalmology) Gatha Mayer, MD as Consulting Physician (Gastroenterology) Brand Males, MD as Consulting Physician (Pulmonary Disease)  Surgical: He  has a past surgical history that includes right leg amputation (05/14/1977); Cardiac catheterization (6468); Colonoscopy w/ polypectomy (08/12/2010); Inguinal hernia repair; Esophagogastroduodenoscopy; Shoulder arthroscopy (Left, 2000); Cataract  extraction, bilateral; Colonoscopy; Cystoscopy w/ ureteral stent placement (Right, 03/04/2018); UPPER GASTROINTESTINAL ENDOSCOPY  WITH DILATION (02/24/2018); Colonoscopy w/ polypectomy (07/16/2016); and Cystoscopy/ureteroscopy/holmium laser/stent placement (Right, 03/16/2018). Family His family history includes Cancer in his mother; Colon cancer in his brother; Hypertension in his father; Leukemia in his brother; Other in his brother; Pancreatic cancer in his mother; Prostate cancer in his brother. Social history  He reports that he quit smoking about 19 years ago. His smoking use included cigarettes. He has a 60.00 pack-year smoking history. He has never used smokeless tobacco. He reports previous alcohol use. He reports that he does not use drugs.  MEDICARE WELLNESS OBJECTIVES: Physical activity: Current Exercise Habits: The patient has a physically strenuous job, but has no regular exercise apart from work., Exercise limited by: None identified Cardiac risk factors: Cardiac Risk Factors include: advanced age (>67mn, >>12women);dyslipidemia;hypertension;male gender Depression/mood screen:   [facility] 08/29/2019  Decreased Interest 0  Down, Depressed, Hopeless 0  PHQ - 2 Score 0    ADLs:  In your present state of health, do you have any difficulty performing the following activities: 08/29/2019 05/21/2019  Hearing? N N  Vision? N N  Difficulty concentrating or making decisions? N N  Walking or climbing stairs? N N  Dressing or bathing? N N  Doing errands, shopping? N N  Preparing Food and eating ? N -  Using the Toilet? N -  In the past six months, have you accidently leaked urine? N -  Do you have problems with loss of bowel control? N -  Managing your Medications? N -  Managing your Finances? N -  Housekeeping or managing your Housekeeping? N -  Some recent data might be hidden     Cognitive Testing  Alert? Yes  Normal Appearance?Yes  Oriented to person? Yes  Place?  Yes   Time? Yes  Recall of three objects?  Yes  Can perform simple calculations? Yes  Displays appropriate judgment?Yes  Can read the correct time from a watch face?Yes  EOL planning: Does Patient Have a Medical Advance Directive?: Yes Type of Advance Directive: [place] [facility] will Does patient want to make changes to medical advance directive?: No - Patient declined Would patient like information on creating a medical advance directive?: Yes (MAU/Ambulatory/Procedural Areas - Information given)   Objective:   Today's Vitals   08/29/19 1437  BP: 126/80  Pulse: 100  Temp: 97.7 F (36.5 C)  SpO2: 95%  Weight: 145 lb (65.8 kg)  Height: 5' 8"  (1.727 m)  PainSc: 0-No pain   Body mass index is 22.05 kg/m.  General appearance: alert, no distress, WD/WN, male HEENT: normocephalic, sclerae anicteric, TMs pearly, nares patent, no discharge or erythema, pharynx normal Oral cavity: MMM, no lesions Neck: supple, no lymphadenopathy, no thyromegaly, no masses Heart: RRR, normal S1, S2, no murmurs Lungs: Rhonchi noted in all lung fields with wheezing bilateral upper lobes. Abdomen: +bs, soft, non tender, non distended, no masses, no hepatomegaly, no splenomegaly Musculoskeletal: nontender, no swelling, no obvious deformity Extremities: no edema, no cyanosis, no clubbing Pulses: 2+ symmetric,  upper and lower extremities, normal cap refill Neurological: alert, oriented x 3, CN2-12 intact, strength normal upper extremities and lower extremities, sensation normal throughout, DTRs 2+ throughout, no cerebellar signs, gait normal Psychiatric: normal affect, behavior normal, pleasant   Medicare Attestation I have personally reviewed: The patient's medical and social history Their use of alcohol, tobacco or illicit drugs Their current medications and supplements The patient's functional ability including ADLs,fall risks, home safety risks, cognitive, and hearing and visual  impairment Diet and physical activities Evidence for depression or mood disorders  The patient's weight, height, BMI, and visual acuity have been recorded in the chart.  I have made referrals, counseling, and provided education to the patient based on review of the above and I have provided the patient with a written personalized care plan for preventive services.     Garnet Sierras, NP   08/29/2019

## 2019-08-31 ENCOUNTER — Other Ambulatory Visit: Payer: Self-pay | Admitting: Adult Health Nurse Practitioner

## 2019-08-31 DIAGNOSIS — J441 Chronic obstructive pulmonary disease with (acute) exacerbation: Secondary | ICD-10-CM

## 2019-08-31 MED ORDER — AZITHROMYCIN 250 MG PO TABS: ORAL_TABLET | ORAL | 1 refills | Status: AC

## 2019-08-31 NOTE — Progress Notes (Signed)
Please contact patient to let him know his Your white blood cell count is elevated.  Concerned that there is bacteria involvement with a COPD exacerbation based on assessment in office.  We are going to send in azithromyacin for you to take.  Take two tablets today and then one tablet daily until complete.  Kidney function is stable but be sure to increase water intake.    Your cholesterol is very high.  LDL (bad cholesterol) is 144.  We should increase your Crestor to 40mg .  You can take two tablets, taken them at bedtime so they are the most effective.  Next prescription will be for 40mg  tablet.  Schedule office visit with me in two weeks to check on breathing and labs.  Thank you,   Danton Sewer

## 2019-09-07 ENCOUNTER — Ambulatory Visit (INDEPENDENT_AMBULATORY_CARE_PROVIDER_SITE_OTHER): Payer: Medicare HMO | Admitting: Physician Assistant

## 2019-09-07 ENCOUNTER — Encounter: Payer: Self-pay | Admitting: Physician Assistant

## 2019-09-07 ENCOUNTER — Other Ambulatory Visit: Payer: Self-pay

## 2019-09-07 VITALS — BP 132/80 | HR 69 | Temp 97.3°F | Resp 14 | Wt 146.6 lb

## 2019-09-07 DIAGNOSIS — B3781 Candidal esophagitis: Secondary | ICD-10-CM

## 2019-09-07 DIAGNOSIS — R05 Cough: Secondary | ICD-10-CM | POA: Diagnosis not present

## 2019-09-07 DIAGNOSIS — R1013 Epigastric pain: Secondary | ICD-10-CM | POA: Diagnosis not present

## 2019-09-07 DIAGNOSIS — R059 Cough, unspecified: Secondary | ICD-10-CM

## 2019-09-07 MED ORDER — LIDOCAINE VISCOUS HCL 2 % MT SOLN: 15.0000 mL | OROMUCOSAL | 0 refills | Status: DC | PRN

## 2019-09-07 MED ORDER — FLUCONAZOLE 100 MG PO TABS: 100.0000 mg | ORAL_TABLET | Freq: Every day | ORAL | 0 refills | Status: AC

## 2019-09-07 NOTE — Progress Notes (Signed)
Subjective:    Patient ID: Leslie Cardenas, male    DOB: [birth year], 79 y.o.   MRN: [id number]  HPI 79 y.o. WM with history of HTN,HLD, prediabetes, CKDIII, COPD GERD presents with hoarseness and sore throat.  Recently seen for medicare wellness on on 08/29/19 and treated for a COPD exacerbation with zpak and increased prednisone and now he is on his normal 5 mg BID. Given sample inhaler, breztri that helped.   He has reflux history, on prilosec in the AM but he has been having burping,  beltching in the AM, epigastric tightness, has had constant right sided pain for a few days, worse with deep breath. He has SOB with walking but states the breztri helped increase his walking.   CT head 04/2016 CT chest 01/2015 Normal stress test 11/2011 CXR 07/29/2018 Blood pressure 132/80, pulse 69, temperature (!) 97.3 F (36.3 C), temperature source Temporal, resp. rate 14, weight 146 lb 9.6 oz (66.5 kg), SpO2 98 %.  Medications  Current Outpatient Medications (Endocrine & Metabolic):  .  predniSONE (DELTASONE) 5 MG tablet, Take 1 tablet 2 x /day as directed for COPD  Current Outpatient Medications (Cardiovascular):  .  nitroGLYCERIN (NITROSTAT) 0.4 MG SL tablet, Place 1 tablet (0.4 mg total) under the tongue every 5 (five) minutes x 3 doses as needed for chest pain. Marland Kitchen  olmesartan-hydrochlorothiazide (BENICAR HCT) 20-12.5 MG tablet, Take 1 tablet Daily for BP .  rosuvastatin (CRESTOR) 20 MG tablet, Take 1 tablet daily for Cholesterol .  tadalafil (CIALIS) 20 MG tablet, Take 1/2 to 1 tablet every 2 to 3 days if needed for XXXX  Current Outpatient Medications (Respiratory):  .  albuterol (PROVENTIL) (2.5 MG/3ML) 0.083% nebulizer solution, Inhale 1 vial by Nebulizer 4 x /day or every 4 hr to rescue Asthma .  albuterol (VENTOLIN HFA) 108 (90 Base) MCG/ACT inhaler, Inhale 2 puffs into the lungs every 4 (four) hours as needed for wheezing or shortness of breath. Make sure you rinse mouth out after each  use.    Current Outpatient Medications (Other):  .  calcium carbonate (CALCIUM 600) 600 MG TABS tablet, Take 1 tablet (600 mg total) by mouth 2 (two) times daily with a meal. .  omeprazole (PRILOSEC) 40 MG capsule, Take one tablet by mouth 30 to 60 minutes before breakfast. (Please schedule a yearly office visit for further refills). Marland Kitchen  Respiratory Therapy Supplies (NEBULIZER/TUBING/MOUTHPIECE) KIT, Disp one nebulizer machine, tubing set and mouthpiece kit .  VITAMIN D PO, Take 5,000 Units by mouth daily.  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  Review of Systems  Constitutional: Negative.  Negative for chills, fever and unexpected weight change.  HENT: Positive for rhinorrhea, sore throat, trouble swallowing and voice change. Negative for congestion, dental problem, drooling, ear pain, facial swelling, mouth sores, nosebleeds, postnasal drip, sinus pressure, sinus pain, sneezing and tinnitus.   Respiratory: Positive for cough.   Cardiovascular: Negative.   Gastrointestinal: Negative.   Genitourinary: Negative.   Neurological: Negative.   Hematological: Negative.        Objective:   Physical Exam General appearance: alert,  no distress, WD/WN, male HEENT: normocephalic, sclerae anicteric, TMs pearly, nares patent,  pharynx with white plaques and erythema Oral cavity: white plaques along bilateral cheeks Neck: supple, no lymphadenopathy, no thyromegaly, no masses Heart: RRR,  normal S1, S2, systolic murmur Lungs: CTA bilaterally, + wheezes, without rhonchi, or rales Abdomen: +bs, soft, non tender, non distended, no masses, no hepatomegaly, no splenomegaly Musculoskeletal: nontender, no swelling, no obvious deformity Extremities: no edema, no cyanosis, no clubbing Pulses: 2+ symmetric, upper and lower extremities, normal cap refill Neurological: alert, oriented x 3, CN2-12 intact, strength normal upper extremities and lower extremities, sensation normal throughout, DTRs 2+ throughout, no cerebellar signs, gait normal, has right BKA Psychiatric: normal affect, behavior normal, pleasant      Assessment & Plan:   Epigastric pain -     EKG 12-Lead - likely from prednisone use however with co morbidites will get EKG EKG normal Go to the ER if any chest pain, shortness of breath, nausea, dizziness, severe HA, changes vision/speech Get on prilosec OTC  Cough -     DG Chest 2 View; Future - get CXR with continuing cough- may need CT lung if not improving  Esophageal yeast infection ([place]) Likely esophageal candidiasis if not better will refer to ENT for scope with smoking history.  -     lidocaine (XYLOCAINE) 2 % solution; Use as directed 15 mLs in the mouth or throat as needed for mouth pain. -     fluconazole (DIFLUCAN) 100 MG tablet; Take 1 tablet (100 mg total) by mouth daily for 14 days.     Future Appointments  Date Time Provider [place]  09/14/2019 10:30 AM Garnet Sierras, NP [facility] None  11/28/2019  9:30 AM Unk Pinto, MD [facility] None  06/06/2020 10:00 AM Unk Pinto, MD [facility] None

## 2019-09-07 NOTE — Patient Instructions (Addendum)
INFORMATION ABOUT YOUR XRAY  Can walk into [address] for an Insurance account manager. They will have the order and take you back. You do not any paper work, I should get the result back today or tomorrow. This order is good for a year.  Can call [phone] to schedule an appointment if you wish.   Increase the prilosec/omeprazole for 2 x a day for 7-14 days and then go back to the once a day.   Yeast Esophagitis  Esophagitis is inflammation of the esophagus. The esophagus is the tube that carries food from your mouth to your stomach. Esophagitis can cause soreness or pain in the esophagus. This condition can make it difficult and painful to swallow. What are the causes?  An infection of the esophagus. This most often occurs in people who have a weakened immune system. What are the signs or symptoms? Symptoms of this condition include:  Difficult or painful swallowing.  Pain with swallowing acidic liquids, such as citrus juices.  Pain with burping.  Chest pain.  Difficulty breathing.  Nausea.  Vomiting.  Pain in the abdomen.  Weight loss.  Ulcers in the mouth.  Patches of white material in the mouth (candidiasis).  Fever.  Coughing up blood or vomiting blood.  Stool that is black, tarry, or bright red. How is this diagnosed? Your health care provider will take a medical history and perform a physical exam. You may also have other tests, including:  An endoscopy to examine your esophagus and stomach with a small flexible tube with a camera.  A test that measures the acidity level in your esophagus.  A test that measures how much pressure is on your esophagus.  A barium swallow or modified barium swallow to show the shape, size, and functioning of your esophagus.  Allergy tests. How is this treated? Treatment for this condition depends on the cause of your esophagitis. In some cases, steroids or other medicines may be given to help relieve your symptoms or to treat  the underlying cause of your condition. You may have to make some lifestyle changes, such as:  Avoiding alcohol.  Quitting smoking.  Changing your diet.  Exercising.  Changing your sleep habits and your sleep environment. Follow these instructions at home: Medicines  Take over-the-counter and prescription medicines only as told by your health care provider.  Do not take aspirin, ibuprofen, or other NSAIDs unless your health care provider told you to do so.  If you have trouble taking pills: ? Use a pill splitter to decrease the size of the pill. This will decrease the chance of the pill getting stuck or injuring your esophagus. ? Drink water after you take a pill. Eating and drinking   Avoid foods and drinks that seem to make your symptoms worse.  Follow a diet as recommended by your health care provider. This may involve avoiding foods and drinks such as: ? Coffee and tea (with or without caffeine). ? Drinks that contain alcohol. ? Energy drinks and sports drinks. ? Carbonated drinks or sodas. ? Chocolate and cocoa. ? Peppermint and mint flavorings. ? Garlic and onions. ? Horseradish. ? Spicy and acidic foods, including peppers, chili powder, curry powder, vinegar, hot sauces, and barbecue sauce. ? Citrus fruit juices and citrus fruits, such as oranges, lemons, and limes. ? Tomato-based foods, such as red sauce, chili, salsa, and pizza with red sauce. ? Fried and fatty foods, such as donuts, french fries, potato chips, and high-fat dressings. ? High-fat meats, such  as hot dogs and fatty cuts of red and white meats, such as rib eye steak, sausage, ham, and bacon. ? High-fat dairy items, such as whole milk, butter, and cream cheese. Lifestyle  Eat small, frequent meals instead of large meals.  Avoid drinking large amounts of liquid with your meals.  Avoid eating meals during the 2-3 hours before bedtime.  Avoid lying down right after you eat.  Do not exercise right  after you eat.  Do not use any products that contain nicotine or tobacco, such as cigarettes and e-cigarettes. If you need help quitting, ask your health care provider. General instructions   Pay attention to any changes in your symptoms. Let your health care provider know about them.  Wear loose-fitting clothing. Do not wear anything tight around your waist that causes pressure on your abdomen.  Raise (elevate) the head of your bed about 6 inches (15 cm).  Try relaxation strategies such as yoga, deep breathing, or meditation to manage stress. If you need help reducing stress, ask your health care provider.  If you are overweight, reduce your weight to an amount that is healthy for you. Ask your health care provider for guidance about a safe weight loss goal.  Keep all follow-up visits as told by your health care provider. This is important. Contact a health care provider if:  You have new symptoms.  You have unexplained weight loss.  You have difficulty swallowing, or it hurts to swallow.  You have wheezing or a cough that does not go away.  Your symptoms do not improve with treatment.  You have frequent heartburn for more than two weeks. Get help right away if:  You have severe pain in your arms, neck, jaw, teeth, or back.  You feel sweaty, dizzy, or light-headed.  You have chest pain or shortness of breath.  You vomit and your vomit looks like blood or coffee grounds.  Your stool is bloody or black.  You have a fever.  You cannot swallow, drink, or eat. Summary  Esophagitis is inflammation of the esophagus.  Most causes of esophagitis are not serious.  Follow your health care provider's instructions about eating and drinking. Follow instructions on medicines.  Contact a health care provider if you have new symptoms, have weight loss, or coughing that does not stop.  Get help right away if you have severe pain in the arms, neck, jaw, teeth or back, or if you  have chest pain, shortness of breath, or fever. This information is not intended to replace advice given to you by your health care provider. Make sure you discuss any questions you have with your health care provider. Document Revised: 01/14/2018 Document Reviewed: 01/14/2018 Elsevier Patient Education  [place].

## 2019-09-13 ENCOUNTER — Encounter: Payer: Self-pay | Admitting: Internal Medicine

## 2019-09-14 ENCOUNTER — Other Ambulatory Visit: Payer: Self-pay

## 2019-09-14 ENCOUNTER — Ambulatory Visit (INDEPENDENT_AMBULATORY_CARE_PROVIDER_SITE_OTHER): Payer: Medicare HMO | Admitting: Adult Health Nurse Practitioner

## 2019-09-14 ENCOUNTER — Encounter: Payer: Self-pay | Admitting: Adult Health Nurse Practitioner

## 2019-09-14 VITALS — BP 126/80 | HR 76 | Temp 97.3°F | Wt 144.0 lb

## 2019-09-14 DIAGNOSIS — J441 Chronic obstructive pulmonary disease with (acute) exacerbation: Secondary | ICD-10-CM

## 2019-09-14 DIAGNOSIS — D72828 Other elevated white blood cell count: Secondary | ICD-10-CM

## 2019-09-14 DIAGNOSIS — J301 Allergic rhinitis due to pollen: Secondary | ICD-10-CM

## 2019-09-14 DIAGNOSIS — N1831 Chronic kidney disease, stage 3a: Secondary | ICD-10-CM | POA: Diagnosis not present

## 2019-09-14 LAB — COMPLETE METABOLIC PANEL WITH GFR
AG Ratio: 1.8 (calc) (ref 1.0–2.5)
ALT: 14 U/L (ref 9–46)
AST: 17 U/L (ref 10–35)
Albumin: 3.9 g/dL (ref 3.6–5.1)
Alkaline phosphatase (APISO): 55 U/L (ref 35–144)
BUN: 15 mg/dL (ref 7–25)
CO2: 25 mmol/L (ref 20–32)
Calcium: 9.6 mg/dL (ref 8.6–10.3)
Chloride: 104 mmol/L (ref 98–110)
Creat: 1.01 mg/dL (ref 0.70–1.18)
GFR, Est African American: 82 mL/min/{1.73_m2} (ref >=60)
GFR, Est Non African American: 70 mL/min/{1.73_m2} (ref >=60)
Globulin: 2.2 g/dL (calc) (ref 1.9–3.7)
Glucose, Bld: 108 mg/dL — ABNORMAL HIGH (ref 65–99)
Potassium: 4.7 mmol/L (ref 3.5–5.3)
Sodium: 137 mmol/L (ref 135–146)
Total Bilirubin: 0.3 mg/dL (ref 0.2–1.2)
Total Protein: 6.1 g/dL (ref 6.1–8.1)

## 2019-09-14 LAB — CBC WITH DIFFERENTIAL/PLATELET
Absolute Monocytes: 466 cells/uL (ref 200–950)
Basophils Absolute: 106 cells/uL (ref 0–200)
Basophils Relative: 0.8 %
Eosinophils Absolute: 0 cells/uL — ABNORMAL LOW (ref 15–500)
Eosinophils Relative: 0 %
HCT: 41.3 % (ref 38.5–50.0)
Hemoglobin: 14.1 g/dL (ref 13.2–17.1)
Lymphs Abs: 439 cells/uL — ABNORMAL LOW (ref 850–3900)
MCH: 33.5 pg — ABNORMAL HIGH (ref 27.0–33.0)
MCHC: 34.1 g/dL (ref 32.0–36.0)
MCV: 98.1 fL (ref 80.0–100.0)
MPV: 9.1 fL (ref 7.5–12.5)
Monocytes Relative: 3.5 %
Neutro Abs: 12289 cells/uL — ABNORMAL HIGH (ref 1500–7800)
Neutrophils Relative %: 92.4 %
Platelets: 367 10*3/uL (ref 140–400)
RBC: 4.21 10*6/uL (ref 4.20–5.80)
RDW: 13.1 % (ref 11.0–15.0)
Total Lymphocyte: 3.3 %
WBC: 13.3 10*3/uL — ABNORMAL HIGH (ref 3.8–10.8)

## 2019-09-14 MED ORDER — LORATADINE 10 MG PO TABS: 10.0000 mg | ORAL_TABLET | Freq: Every day | ORAL | 2 refills | Status: DC | PRN

## 2019-09-14 NOTE — Patient Instructions (Addendum)
   Check in the large book from [employer], formulary, under Respiratory medications OR Inhalers.   Leslie Cardenas is the sample I gave you last office visit.  Trelegy is another one in this class of medications.  See which inhaler is the most affordable.  Continue to use the Breztri every morning and every evening.  Be sure to rinse your mouth with water after each use.   For your allergies try:  Claritin (loratadine) daily.  You can take this in the morning.    If this is not effective let us know.  You can try Zyrtec or Xyxal.  These we would recommend to take at night.

## 2019-09-14 NOTE — Progress Notes (Signed)
Follow up after COPD exacerbation    Assessment:   [name] was seen today for follow-up and medicare wellness.  Diagnoses and all orders for this visit:  Essential hypertension Continue current medications: Olmesartan / HCTZ 20/12.'5mg'$  Monitor blood pressure at home; call if consistently over 130/80 Continue DASH diet.   Reminder to go to the ER if any CP, SOB, nausea, dizziness, severe HA, changes vision/speech, left arm numbness and tingling and jaw pain. -     CBC with Differential/Platelet -     COMPLETE METABOLIC PANEL WITH GFR  AStage 3a chronic kidney disease Increase fluids  Avoid NSAIDS Blood pressure control Monitor sugars  Will continue to monitor   Chronic obstructive pulmonary disease, unspecified COPD type (HCC) -     Rx Breztri, use BID Discussed rinsing mouth after use Use albuterol PRN, notify office if increased use Taking Prednisone daily, consider tapering off.   Over 20 minutes of face to face interview, exam, counseling, chart review, and critical decision making was performed.  Future Appointments  Date Time Provider [place]  11/28/2019  9:30 AM Unk Pinto, MD [facility] None  06/06/2020 10:00 AM Unk Pinto, MD [facility] None      Subjective:  Leslie Cardenas is a 79 y.o. male who presents for 2 week follow up after azithromycin for COPD exacerbation.  He was also started on Breztri BID with a sample.  Today he reports that his breathing has improved tremendously and he no longer gets short of breath while he is working.  He does not have to sit and rest after going up steps.  He denies any side effects from the inhaler and has been rinsing his mouth BID.  He had an OV interm for epigastric pain and esophageal yeast.  Likely related to concurrent treatment and current long term steroid use.  He has not had to use his rescue inhaler since starting Breztri BID.  He reports that his epigastric pain and mouth discomfort has  resolved. Reports he is able to afford the medication.  Reports he will also look at his formulary as well.     His blood pressure has been controlled at home, today their BP is BP: 126/80 He does workout by means of yard and housework. He denies chest pain, shortness of breath, dizziness.       Medication Review:  Current Outpatient Medications (Endocrine & Metabolic):  .  predniSONE (DELTASONE) 5 MG tablet, Take 1 tablet 2 x /day as directed for COPD  Current Outpatient Medications (Cardiovascular):  .  nitroGLYCERIN (NITROSTAT) 0.4 MG SL tablet, Place 1 tablet (0.4 mg total) under the tongue every 5 (five) minutes x 3 doses as needed for chest pain. Marland Kitchen  olmesartan-hydrochlorothiazide (BENICAR HCT) 20-12.5 MG tablet, Take 1 tablet Daily for BP .  rosuvastatin (CRESTOR) 20 MG tablet, Take 1 tablet daily for Cholesterol .  tadalafil (CIALIS) 20 MG tablet, Take 1/2 to 1 tablet every 2 to 3 days if needed for XXXX  Current Outpatient Medications (Respiratory):  .  albuterol (PROVENTIL) (2.5 MG/3ML) 0.083% nebulizer solution, Inhale 1 vial by Nebulizer 4 x /day or every 4 hr to rescue Asthma .  albuterol (VENTOLIN HFA) 108 (90 Base) MCG/ACT inhaler, Inhale 2 puffs into the lungs every 4 (four) hours as needed for wheezing or shortness of breath. Make sure you rinse mouth out after each use.    Current Outpatient Medications (Other):  .  calcium carbonate (CALCIUM 600) 600 MG TABS tablet, Take 1 tablet (  600 mg total) by mouth 2 (two) times daily with a meal. .  fluconazole (DIFLUCAN) 100 MG tablet, Take 1 tablet (100 mg total) by mouth daily for 14 days. Marland Kitchen  lidocaine (XYLOCAINE) 2 % solution, Use as directed 15 mLs in the mouth or throat as needed for mouth pain. Marland Kitchen  omeprazole (PRILOSEC) 40 MG capsule, Take one tablet by mouth 30 to 60 minutes before breakfast. (Please schedule a yearly office visit for further refills). Marland Kitchen  Respiratory Therapy Supplies (NEBULIZER/TUBING/MOUTHPIECE) KIT,  Disp one nebulizer machine, tubing set and mouthpiece kit .  VITAMIN D PO, Take 5,000 Units by mouth daily.  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  Screening Tests Immunization History  Administered Date(s) Administered  . Influenza Split 04/08/2012  . Influenza, High Dose Seasonal PF 03/04/2017  . Influenza,inj,Quad PF,6+ Mos 05/16/2015, 02/15/2018  . Pneumococcal Conjugate-13 07/25/2014  . Pneumococcal Polysaccharide-23 04/08/2012  . Pneumococcal-Unspecified 06/06/2002  . Td 06/30/2011  . Zoster 05/22/2008    Preventative care: Last colonoscopy: 2018  Prior vaccinations: TD or Tdap: 2013  Influenza: 2019 Pneumococcal: 2013 Prevnar13: 2016 Shingles/Zostavax: 2009  Names of Other Physician/Practitioners you currently use: 1. [facility] here for primary care 2.Eye Exam [place] 2020 3. Dentist, has dentures.  Fit well, no issues.  Patient Care Team: Unk Pinto, MD as PCP - General (Internal Medicine) Luberta Mutter, MD as Consulting Physician (Ophthalmology) Gatha Mayer, MD as Consulting Physician (Gastroenterology) Brand Males, MD as Consulting Physician (Pulmonary Disease)  Surgical: He  has a past surgical history that includes right leg amputation (05/14/1977); Cardiac catheterization (3299); Colonoscopy w/ polypectomy (08/12/2010); Inguinal hernia repair;  Esophagogastroduodenoscopy; Shoulder arthroscopy (Left, 2000); Cataract extraction, bilateral; Colonoscopy; Cystoscopy w/ ureteral stent placement (Right, 03/04/2018); UPPER GASTROINTESTINAL ENDOSCOPY  WITH DILATION (02/24/2018); Colonoscopy w/ polypectomy (07/16/2016); and Cystoscopy/ureteroscopy/holmium laser/stent placement (Right, 03/16/2018). Family His family history includes Cancer in his mother; Colon cancer in his brother; Hypertension in his father; Leukemia in his brother; Other in his brother; Pancreatic cancer in his mother; Prostate cancer in his brother. Social history  He reports that he quit smoking about 19 years ago. His smoking use included cigarettes. He has a 60.00 pack-year smoking history. He has never used smokeless tobacco. He reports previous alcohol use. He reports that he does not use drugs.    Objective:   Today's Vitals   09/14/19 1019  BP: 126/80  Pulse: 76  Temp: (!) 97.3 F (36.3 C)  SpO2: 96%  Weight: 144 lb (65.3 kg)   Body mass index is 21.9 kg/m.  General appearance: alert, no distress, WD/WN, male HEENT: normocephalic, sclerae anicteric, TMs pearly, nares patent, no discharge or erythema, pharynx normal Oral cavity: MMM, no lesions Neck: supple, no lymphadenopathy, no thyromegaly, no masses Heart: RRR, normal S1, S2, no murmurs Lungs: Rhonchi noted bilateral upper lobes, clears with coughing. Abdomen: +bs, soft, non tender, non distended, no masses, no hepatomegaly, no splenomegaly Musculoskeletal: nontender, no swelling, no obvious deformity Extremities: no edema, no cyanosis, no clubbing Pulses: 2+ symmetric, upper and lower extremities, normal cap refill  Neurological: alert, oriented x 3, CN2-12 intact, strength normal upper extremities and lower extremities, sensation normal throughout, DTRs 2+ throughout, no cerebellar signs, gait normal Psychiatric: normal affect, behavior normal, pleasant   Garnet Sierras, NP   09/14/2019

## 2019-10-21 DIAGNOSIS — R2689 Other abnormalities of gait and mobility: Secondary | ICD-10-CM | POA: Diagnosis not present

## 2019-10-21 DIAGNOSIS — M4802 Spinal stenosis, cervical region: Secondary | ICD-10-CM | POA: Diagnosis not present

## 2019-10-21 DIAGNOSIS — M4692 Unspecified inflammatory spondylopathy, cervical region: Secondary | ICD-10-CM | POA: Diagnosis not present

## 2019-10-21 DIAGNOSIS — E871 Hypo-osmolality and hyponatremia: Secondary | ICD-10-CM | POA: Diagnosis not present

## 2019-10-21 DIAGNOSIS — S13150A Subluxation of C4/C5 cervical vertebrae, initial encounter: Secondary | ICD-10-CM | POA: Diagnosis not present

## 2019-10-21 DIAGNOSIS — Z0389 Encounter for observation for other suspected diseases and conditions ruled out: Secondary | ICD-10-CM | POA: Diagnosis not present

## 2019-10-21 DIAGNOSIS — S22089A Unspecified fracture of T11-T12 vertebra, initial encounter for closed fracture: Secondary | ICD-10-CM | POA: Diagnosis not present

## 2019-10-21 DIAGNOSIS — S50311A Abrasion of right elbow, initial encounter: Secondary | ICD-10-CM | POA: Diagnosis not present

## 2019-10-21 DIAGNOSIS — Z041 Encounter for examination and observation following transport accident: Secondary | ICD-10-CM | POA: Diagnosis not present

## 2019-10-21 DIAGNOSIS — M546 Pain in thoracic spine: Secondary | ICD-10-CM | POA: Diagnosis not present

## 2019-10-21 DIAGNOSIS — J449 Chronic obstructive pulmonary disease, unspecified: Secondary | ICD-10-CM | POA: Diagnosis not present

## 2019-10-21 DIAGNOSIS — M48061 Spinal stenosis, lumbar region without neurogenic claudication: Secondary | ICD-10-CM | POA: Diagnosis not present

## 2019-10-21 DIAGNOSIS — S22088A Other fracture of T11-T12 vertebra, initial encounter for closed fracture: Secondary | ICD-10-CM | POA: Diagnosis not present

## 2019-10-21 DIAGNOSIS — S39012A Strain of muscle, fascia and tendon of lower back, initial encounter: Secondary | ICD-10-CM | POA: Diagnosis not present

## 2019-10-21 DIAGNOSIS — Y92414 Local residential or business street as the place of occurrence of the external cause: Secondary | ICD-10-CM | POA: Diagnosis not present

## 2019-10-21 DIAGNOSIS — M542 Cervicalgia: Secondary | ICD-10-CM | POA: Diagnosis not present

## 2019-10-21 DIAGNOSIS — K4020 Bilateral inguinal hernia, without obstruction or gangrene, not specified as recurrent: Secondary | ICD-10-CM | POA: Diagnosis not present

## 2019-10-21 DIAGNOSIS — N281 Cyst of kidney, acquired: Secondary | ICD-10-CM | POA: Diagnosis not present

## 2019-10-21 DIAGNOSIS — S60312A Abrasion of left thumb, initial encounter: Secondary | ICD-10-CM | POA: Diagnosis not present

## 2019-10-21 DIAGNOSIS — K7689 Other specified diseases of liver: Secondary | ICD-10-CM | POA: Diagnosis not present

## 2019-10-21 DIAGNOSIS — N200 Calculus of kidney: Secondary | ICD-10-CM | POA: Diagnosis not present

## 2019-10-21 DIAGNOSIS — S199XXA Unspecified injury of neck, initial encounter: Secondary | ICD-10-CM | POA: Diagnosis not present

## 2019-10-22 DIAGNOSIS — S134XXA Sprain of ligaments of cervical spine, initial encounter: Secondary | ICD-10-CM | POA: Insufficient documentation

## 2019-10-23 DIAGNOSIS — S134XXA Sprain of ligaments of cervical spine, initial encounter: Secondary | ICD-10-CM | POA: Diagnosis not present

## 2019-10-24 DIAGNOSIS — S22080A Wedge compression fracture of T11-T12 vertebra, initial encounter for closed fracture: Secondary | ICD-10-CM | POA: Diagnosis not present

## 2019-10-24 DIAGNOSIS — S22089A Unspecified fracture of T11-T12 vertebra, initial encounter for closed fracture: Secondary | ICD-10-CM | POA: Insufficient documentation

## 2019-10-24 DIAGNOSIS — K5900 Constipation, unspecified: Secondary | ICD-10-CM | POA: Diagnosis not present

## 2019-10-24 DIAGNOSIS — S22080D Wedge compression fracture of T11-T12 vertebra, subsequent encounter for fracture with routine healing: Secondary | ICD-10-CM | POA: Diagnosis not present

## 2019-10-24 DIAGNOSIS — S134XXD Sprain of ligaments of cervical spine, subsequent encounter: Secondary | ICD-10-CM | POA: Diagnosis not present

## 2019-10-24 DIAGNOSIS — S134XXA Sprain of ligaments of cervical spine, initial encounter: Secondary | ICD-10-CM | POA: Diagnosis not present

## 2019-10-25 DIAGNOSIS — S134XXA Sprain of ligaments of cervical spine, initial encounter: Secondary | ICD-10-CM | POA: Diagnosis not present

## 2019-10-25 DIAGNOSIS — S22080A Wedge compression fracture of T11-T12 vertebra, initial encounter for closed fracture: Secondary | ICD-10-CM | POA: Diagnosis not present

## 2019-10-25 DIAGNOSIS — S22080D Wedge compression fracture of T11-T12 vertebra, subsequent encounter for fracture with routine healing: Secondary | ICD-10-CM | POA: Diagnosis not present

## 2019-10-25 DIAGNOSIS — K5900 Constipation, unspecified: Secondary | ICD-10-CM | POA: Diagnosis not present

## 2019-10-25 DIAGNOSIS — S134XXD Sprain of ligaments of cervical spine, subsequent encounter: Secondary | ICD-10-CM | POA: Diagnosis not present

## 2019-10-26 DIAGNOSIS — S134XXA Sprain of ligaments of cervical spine, initial encounter: Secondary | ICD-10-CM | POA: Diagnosis not present

## 2019-10-26 DIAGNOSIS — S22080A Wedge compression fracture of T11-T12 vertebra, initial encounter for closed fracture: Secondary | ICD-10-CM | POA: Diagnosis not present

## 2019-10-26 DIAGNOSIS — S22080D Wedge compression fracture of T11-T12 vertebra, subsequent encounter for fracture with routine healing: Secondary | ICD-10-CM | POA: Diagnosis not present

## 2019-10-26 DIAGNOSIS — S134XXD Sprain of ligaments of cervical spine, subsequent encounter: Secondary | ICD-10-CM | POA: Diagnosis not present

## 2019-10-26 DIAGNOSIS — K5900 Constipation, unspecified: Secondary | ICD-10-CM | POA: Diagnosis not present

## 2019-10-26 DIAGNOSIS — E878 Other disorders of electrolyte and fluid balance, not elsewhere classified: Secondary | ICD-10-CM | POA: Insufficient documentation

## 2019-10-26 DIAGNOSIS — E871 Hypo-osmolality and hyponatremia: Secondary | ICD-10-CM | POA: Insufficient documentation

## 2019-10-27 DIAGNOSIS — S134XXA Sprain of ligaments of cervical spine, initial encounter: Secondary | ICD-10-CM | POA: Diagnosis not present

## 2019-10-28 DIAGNOSIS — S134XXA Sprain of ligaments of cervical spine, initial encounter: Secondary | ICD-10-CM | POA: Diagnosis not present

## 2019-10-29 DIAGNOSIS — S134XXA Sprain of ligaments of cervical spine, initial encounter: Secondary | ICD-10-CM | POA: Diagnosis not present

## 2019-10-30 DIAGNOSIS — K30 Functional dyspepsia: Secondary | ICD-10-CM | POA: Diagnosis not present

## 2019-10-30 DIAGNOSIS — E785 Hyperlipidemia, unspecified: Secondary | ICD-10-CM | POA: Diagnosis not present

## 2019-10-30 DIAGNOSIS — S134XXA Sprain of ligaments of cervical spine, initial encounter: Secondary | ICD-10-CM | POA: Diagnosis not present

## 2019-10-30 DIAGNOSIS — R634 Abnormal weight loss: Secondary | ICD-10-CM | POA: Diagnosis not present

## 2019-10-30 DIAGNOSIS — Z4781 Encounter for orthopedic aftercare following surgical amputation: Secondary | ICD-10-CM | POA: Diagnosis not present

## 2019-10-30 DIAGNOSIS — R2689 Other abnormalities of gait and mobility: Secondary | ICD-10-CM | POA: Diagnosis not present

## 2019-10-30 DIAGNOSIS — G8929 Other chronic pain: Secondary | ICD-10-CM | POA: Diagnosis not present

## 2019-10-30 DIAGNOSIS — Z89511 Acquired absence of right leg below knee: Secondary | ICD-10-CM | POA: Diagnosis not present

## 2019-10-30 DIAGNOSIS — M6281 Muscle weakness (generalized): Secondary | ICD-10-CM | POA: Diagnosis not present

## 2019-10-30 DIAGNOSIS — S39012A Strain of muscle, fascia and tendon of lower back, initial encounter: Secondary | ICD-10-CM | POA: Diagnosis not present

## 2019-10-30 DIAGNOSIS — T7840XD Allergy, unspecified, subsequent encounter: Secondary | ICD-10-CM | POA: Diagnosis not present

## 2019-10-30 DIAGNOSIS — J449 Chronic obstructive pulmonary disease, unspecified: Secondary | ICD-10-CM | POA: Diagnosis not present

## 2019-10-30 DIAGNOSIS — S50311A Abrasion of right elbow, initial encounter: Secondary | ICD-10-CM | POA: Diagnosis not present

## 2019-10-30 DIAGNOSIS — S134XXD Sprain of ligaments of cervical spine, subsequent encounter: Secondary | ICD-10-CM | POA: Diagnosis not present

## 2019-10-30 DIAGNOSIS — S22089A Unspecified fracture of T11-T12 vertebra, initial encounter for closed fracture: Secondary | ICD-10-CM | POA: Diagnosis not present

## 2019-10-30 DIAGNOSIS — R262 Difficulty in walking, not elsewhere classified: Secondary | ICD-10-CM | POA: Diagnosis not present

## 2019-10-30 DIAGNOSIS — K219 Gastro-esophageal reflux disease without esophagitis: Secondary | ICD-10-CM | POA: Diagnosis not present

## 2019-10-30 DIAGNOSIS — M4802 Spinal stenosis, cervical region: Secondary | ICD-10-CM | POA: Diagnosis not present

## 2019-10-30 DIAGNOSIS — R066 Hiccough: Secondary | ICD-10-CM | POA: Diagnosis not present

## 2019-10-30 DIAGNOSIS — S22089D Unspecified fracture of T11-T12 vertebra, subsequent encounter for fracture with routine healing: Secondary | ICD-10-CM | POA: Diagnosis not present

## 2019-10-30 DIAGNOSIS — E871 Hypo-osmolality and hyponatremia: Secondary | ICD-10-CM | POA: Diagnosis not present

## 2019-10-30 DIAGNOSIS — R2681 Unsteadiness on feet: Secondary | ICD-10-CM | POA: Diagnosis not present

## 2019-10-30 DIAGNOSIS — S22080D Wedge compression fracture of T11-T12 vertebra, subsequent encounter for fracture with routine healing: Secondary | ICD-10-CM | POA: Diagnosis not present

## 2019-10-30 DIAGNOSIS — M48061 Spinal stenosis, lumbar region without neurogenic claudication: Secondary | ICD-10-CM | POA: Diagnosis not present

## 2019-10-30 DIAGNOSIS — S60312A Abrasion of left thumb, initial encounter: Secondary | ICD-10-CM | POA: Diagnosis not present

## 2019-10-30 DIAGNOSIS — R1310 Dysphagia, unspecified: Secondary | ICD-10-CM | POA: Diagnosis not present

## 2019-11-01 DIAGNOSIS — S22089D Unspecified fracture of T11-T12 vertebra, subsequent encounter for fracture with routine healing: Secondary | ICD-10-CM | POA: Diagnosis not present

## 2019-11-03 DIAGNOSIS — K30 Functional dyspepsia: Secondary | ICD-10-CM | POA: Diagnosis not present

## 2019-11-03 DIAGNOSIS — R066 Hiccough: Secondary | ICD-10-CM | POA: Diagnosis not present

## 2019-11-03 DIAGNOSIS — S22089D Unspecified fracture of T11-T12 vertebra, subsequent encounter for fracture with routine healing: Secondary | ICD-10-CM | POA: Diagnosis not present

## 2019-11-03 DIAGNOSIS — R634 Abnormal weight loss: Secondary | ICD-10-CM | POA: Diagnosis not present

## 2019-11-06 DIAGNOSIS — E785 Hyperlipidemia, unspecified: Secondary | ICD-10-CM | POA: Diagnosis not present

## 2019-11-06 DIAGNOSIS — J449 Chronic obstructive pulmonary disease, unspecified: Secondary | ICD-10-CM | POA: Diagnosis not present

## 2019-11-06 DIAGNOSIS — K219 Gastro-esophageal reflux disease without esophagitis: Secondary | ICD-10-CM | POA: Diagnosis not present

## 2019-11-06 DIAGNOSIS — T7840XD Allergy, unspecified, subsequent encounter: Secondary | ICD-10-CM | POA: Diagnosis not present

## 2019-11-06 DIAGNOSIS — S134XXD Sprain of ligaments of cervical spine, subsequent encounter: Secondary | ICD-10-CM | POA: Diagnosis not present

## 2019-11-06 DIAGNOSIS — S22089D Unspecified fracture of T11-T12 vertebra, subsequent encounter for fracture with routine healing: Secondary | ICD-10-CM | POA: Diagnosis not present

## 2019-11-09 ENCOUNTER — Other Ambulatory Visit: Payer: Self-pay

## 2019-11-09 DIAGNOSIS — S22080A Wedge compression fracture of T11-T12 vertebra, initial encounter for closed fracture: Secondary | ICD-10-CM | POA: Diagnosis not present

## 2019-11-09 DIAGNOSIS — M4312 Spondylolisthesis, cervical region: Secondary | ICD-10-CM | POA: Diagnosis not present

## 2019-11-09 DIAGNOSIS — S134XXA Sprain of ligaments of cervical spine, initial encounter: Secondary | ICD-10-CM | POA: Diagnosis not present

## 2019-11-09 DIAGNOSIS — M47812 Spondylosis without myelopathy or radiculopathy, cervical region: Secondary | ICD-10-CM | POA: Diagnosis not present

## 2019-11-09 NOTE — Patient Outreach (Signed)
[facility]) Care Management  11/09/2019  Michaeal Tylutki [place] February 18, 1941 [id number]     Transition of Care Referral  Referral Date: 11/09/2019 Referral Source: [facility] Discharge Report Date of Discharge: 11/06/2019 Facility: [facility] [place]    Outreach attempt # 1 to patient. Spoke with patient who denies any acute issues or concerns at present. He voices that he goes to see neurosurgeon for follow up shortly. He reports that he has supportive family who assisting and caring for him. patient voices that he is in [place] staying with his son and family. He reports that he will be there for some time. He confirms he has all his meds and no issues/concerns regarding them. [employer] services in place and scheduled to start 11/10/19 per notes. He denies any further RN CM needs or concerns at this time.     Plan: RN CM will close case.   Enzo Montgomery, RN,BSN,CCM [facility] Telephonic Care Management Coordinator Direct Phone: [phone] Toll Free: [phone] Fax: [phone]

## 2019-11-12 DIAGNOSIS — R066 Hiccough: Secondary | ICD-10-CM | POA: Insufficient documentation

## 2019-11-12 DIAGNOSIS — R634 Abnormal weight loss: Secondary | ICD-10-CM | POA: Insufficient documentation

## 2019-11-12 DIAGNOSIS — K30 Functional dyspepsia: Secondary | ICD-10-CM | POA: Insufficient documentation

## 2019-11-26 ENCOUNTER — Other Ambulatory Visit: Payer: Self-pay | Admitting: Internal Medicine

## 2019-11-26 DIAGNOSIS — I10 Essential (primary) hypertension: Secondary | ICD-10-CM

## 2019-11-27 ENCOUNTER — Encounter: Payer: Self-pay | Admitting: Internal Medicine
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
## 2019-11-27 NOTE — Patient Instructions (Signed)

## 2019-11-28 ENCOUNTER — Ambulatory Visit (INDEPENDENT_AMBULATORY_CARE_PROVIDER_SITE_OTHER): Payer: Medicare HMO | Admitting: Internal Medicine

## 2019-11-28 ENCOUNTER — Other Ambulatory Visit: Payer: Self-pay

## 2019-11-28 ENCOUNTER — Other Ambulatory Visit: Payer: Self-pay | Admitting: *Deleted

## 2019-11-28 VITALS — BP 128/84 | HR 88 | Temp 97.0°F | Resp 16 | Ht 68.5 in | Wt 133.4 lb

## 2019-11-28 DIAGNOSIS — K219 Gastro-esophageal reflux disease without esophagitis: Secondary | ICD-10-CM

## 2019-11-28 DIAGNOSIS — Z89511 Acquired absence of right leg below knee: Secondary | ICD-10-CM | POA: Diagnosis not present

## 2019-11-28 DIAGNOSIS — E559 Vitamin D deficiency, unspecified: Secondary | ICD-10-CM

## 2019-11-28 DIAGNOSIS — R7309 Other abnormal glucose: Secondary | ICD-10-CM | POA: Diagnosis not present

## 2019-11-28 DIAGNOSIS — I10 Essential (primary) hypertension: Secondary | ICD-10-CM | POA: Diagnosis not present

## 2019-11-28 DIAGNOSIS — Z79899 Other long term (current) drug therapy: Secondary | ICD-10-CM

## 2019-11-28 DIAGNOSIS — E782 Mixed hyperlipidemia: Secondary | ICD-10-CM | POA: Diagnosis not present

## 2019-11-28 DIAGNOSIS — J449 Chronic obstructive pulmonary disease, unspecified: Secondary | ICD-10-CM | POA: Diagnosis not present

## 2019-11-28 DIAGNOSIS — J301 Allergic rhinitis due to pollen: Secondary | ICD-10-CM

## 2019-11-28 MED ORDER — LORATADINE 10 MG PO TABS: 10.0000 mg | ORAL_TABLET | Freq: Every day | ORAL | 2 refills | Status: DC | PRN

## 2019-11-29 LAB — LIPID PANEL
Cholesterol: 198 mg/dL (ref <200)
HDL: 47 mg/dL (ref >=40)
LDL Cholesterol (Calc): 119 mg/dL (calc) — ABNORMAL HIGH
Non-HDL Cholesterol (Calc): 151 mg/dL (calc) — ABNORMAL HIGH (ref <130)
Total CHOL/HDL Ratio: 4.2 (calc) (ref <5.0)
Triglycerides: 200 mg/dL — ABNORMAL HIGH (ref <150)

## 2019-11-29 LAB — CBC WITH DIFFERENTIAL/PLATELET
Absolute Monocytes: 420 cells/uL (ref 200–950)
Basophils Absolute: 74 cells/uL (ref 0–200)
Basophils Relative: 0.7 %
Eosinophils Absolute: 84 cells/uL (ref 15–500)
Eosinophils Relative: 0.8 %
HCT: 37.4 % — ABNORMAL LOW (ref 38.5–50.0)
Hemoglobin: 12.6 g/dL — ABNORMAL LOW (ref 13.2–17.1)
Lymphs Abs: 546 cells/uL — ABNORMAL LOW (ref 850–3900)
MCH: 33.4 pg — ABNORMAL HIGH (ref 27.0–33.0)
MCHC: 33.7 g/dL (ref 32.0–36.0)
MCV: 99.2 fL (ref 80.0–100.0)
MPV: 8.8 fL (ref 7.5–12.5)
Monocytes Relative: 4 %
Neutro Abs: 9377 cells/uL — ABNORMAL HIGH (ref 1500–7800)
Neutrophils Relative %: 89.3 %
Platelets: 311 10*3/uL (ref 140–400)
RBC: 3.77 10*6/uL — ABNORMAL LOW (ref 4.20–5.80)
RDW: 13.1 % (ref 11.0–15.0)
Total Lymphocyte: 5.2 %
WBC: 10.5 10*3/uL (ref 3.8–10.8)

## 2019-11-29 LAB — COMPLETE METABOLIC PANEL WITH GFR
AG Ratio: 1.6 (calc) (ref 1.0–2.5)
ALT: 12 U/L (ref 9–46)
AST: 16 U/L (ref 10–35)
Albumin: 3.9 g/dL (ref 3.6–5.1)
Alkaline phosphatase (APISO): 61 U/L (ref 35–144)
BUN: 14 mg/dL (ref 7–25)
CO2: 24 mmol/L (ref 20–32)
Calcium: 9.8 mg/dL (ref 8.6–10.3)
Chloride: 108 mmol/L (ref 98–110)
Creat: 1.1 mg/dL (ref 0.70–1.18)
GFR, Est African American: 74 mL/min/{1.73_m2} (ref >=60)
GFR, Est Non African American: 64 mL/min/{1.73_m2} (ref >=60)
Globulin: 2.4 g/dL (calc) (ref 1.9–3.7)
Glucose, Bld: 102 mg/dL — ABNORMAL HIGH (ref 65–99)
Potassium: 3.9 mmol/L (ref 3.5–5.3)
Sodium: 143 mmol/L (ref 135–146)
Total Bilirubin: 0.3 mg/dL (ref 0.2–1.2)
Total Protein: 6.3 g/dL (ref 6.1–8.1)

## 2019-11-29 LAB — VITAMIN D 25 HYDROXY (VIT D DEFICIENCY, FRACTURES): Vit D, 25-Hydroxy: 56 ng/mL (ref 30–100)

## 2019-11-29 LAB — TSH: TSH: 0.42 mIU/L (ref 0.40–4.50)

## 2019-11-29 LAB — HEMOGLOBIN A1C
Hgb A1c MFr Bld: 5.9 % of total Hgb — ABNORMAL HIGH (ref <5.7)
Mean Plasma Glucose: 123 (calc)
eAG (mmol/L): 6.8 (calc)

## 2019-11-29 LAB — INSULIN, RANDOM: Insulin: 4.5 u[IU]/mL

## 2019-11-29 LAB — MAGNESIUM: Magnesium: 1.7 mg/dL (ref 1.5–2.5)

## 2019-11-29 NOTE — Progress Notes (Signed)
=======================================================  -     Magnesium  -   1.7   -  very  low- goal is betw 2.0 - 2.5,   - So..............Marland Kitchen  Recommend that you take  Magnesium 500 mg tablet daily   - also important to eat lots of  leafy green vegetables   - spinach - Kale - collards - greens - okra - asparagus  - broccoli - quinoa - squash - almonds   - black, red, white beans  -  peas - green beans =======================================================  -  Chol 198 - elevated  - ideal or goal is less than 180  - and LDL = 119 - also too high   - Please be sure taking your Crestor / Rosuvastatin and   - Recommend a stricter low cholesterol diet   - Cholesterol only comes from animal sources  - ie. meat, dairy, egg yolks  - Eat all the vegetables you want.  - Avoid meat, especially red meat - Beef AND Pork .  - Avoid cheese & dairy - milk & ice cream.     - Cheese is the most concentrated form of trans-fats which  is the worst thing to clog up our arteries.   - Veggie cheese is OK which can be found in the fresh  produce section at [employer] or [employer] or [employer] =======================================================  -  A1c - better - down from 6.1% to now 5.9% - Great  =======================================================  -  Vit D = 56 - a little lower than previous   - Vitamin D goal is between 70-100.   - Please make sure that you are taking your  Vitamin D 5,000 units as directed.   - It is very important as a natural anti-inflammatory and  helping the immune system protect against viral  infections, like the Covid-19    helping hair, skin, and nails, as well as reducing stroke and  heart attack risk.   - It helps your bones and helps with mood.  - It also decreases numerous cancer risks so please take  it as directed.   - Low Vit D is associated with a 200-300% higher risk for CANCER   and 200-300% higher risk for HEART   ATTACK   &  STROKE.    - It is also associated with higher death rate at younger ages,   autoimmune diseases like Rheumatoid arthritis, Lupus,  Multiple Sclerosis.     - Also many other serious conditions, like depression, Alzheimer's  Dementia, infertility, muscle aches, fatigue, fibromyalgia -  just to name a few.  ======================================================  - All Else - CBC - Kidneys - Electrolytes -  Liver - Magnesium & Thyroid  - all  Normal / OK ======================================================

## 2019-12-15 DIAGNOSIS — S134XXD Sprain of ligaments of cervical spine, subsequent encounter: Secondary | ICD-10-CM | POA: Diagnosis not present

## 2019-12-15 DIAGNOSIS — S22080A Wedge compression fracture of T11-T12 vertebra, initial encounter for closed fracture: Secondary | ICD-10-CM | POA: Diagnosis not present

## 2019-12-15 DIAGNOSIS — M4854XD Collapsed vertebra, not elsewhere classified, thoracic region, subsequent encounter for fracture with routine healing: Secondary | ICD-10-CM | POA: Diagnosis not present

## 2020-01-10 ENCOUNTER — Other Ambulatory Visit: Payer: Self-pay | Admitting: Physician Assistant

## 2020-01-14 ENCOUNTER — Other Ambulatory Visit: Payer: Self-pay | Admitting: Internal Medicine

## 2020-01-14 DIAGNOSIS — E782 Mixed hyperlipidemia: Secondary | ICD-10-CM
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
## 2020-02-29 ENCOUNTER — Encounter: Payer: Self-pay | Admitting: Physician Assistant

## 2020-02-29 ENCOUNTER — Other Ambulatory Visit: Payer: Self-pay

## 2020-02-29 ENCOUNTER — Ambulatory Visit (INDEPENDENT_AMBULATORY_CARE_PROVIDER_SITE_OTHER): Payer: Medicare HMO | Admitting: Physician Assistant

## 2020-02-29 VITALS — BP 128/76 | HR 77 | Temp 97.5°F | Ht 68.5 in | Wt 139.0 lb

## 2020-02-29 DIAGNOSIS — I10 Essential (primary) hypertension: Secondary | ICD-10-CM

## 2020-02-29 DIAGNOSIS — N1831 Chronic kidney disease, stage 3a: Secondary | ICD-10-CM | POA: Diagnosis not present

## 2020-02-29 DIAGNOSIS — Z89511 Acquired absence of right leg below knee: Secondary | ICD-10-CM | POA: Diagnosis not present

## 2020-02-29 DIAGNOSIS — E782 Mixed hyperlipidemia: Secondary | ICD-10-CM

## 2020-02-29 DIAGNOSIS — Z79899 Other long term (current) drug therapy: Secondary | ICD-10-CM

## 2020-02-29 DIAGNOSIS — R7309 Other abnormal glucose: Secondary | ICD-10-CM

## 2020-02-29 DIAGNOSIS — J449 Chronic obstructive pulmonary disease, unspecified: Secondary | ICD-10-CM | POA: Diagnosis not present

## 2020-02-29 NOTE — Patient Instructions (Signed)

## 2020-03-01 LAB — CBC WITH DIFFERENTIAL/PLATELET
Absolute Monocytes: 133 cells/uL — ABNORMAL LOW (ref 200–950)
Basophils Absolute: 59 cells/uL (ref 0–200)
Basophils Relative: 0.8 %
Eosinophils Absolute: 7 cells/uL — ABNORMAL LOW (ref 15–500)
Eosinophils Relative: 0.1 %
HCT: 43 % (ref 38.5–50.0)
Hemoglobin: 14.4 g/dL (ref 13.2–17.1)
Lymphs Abs: 370 cells/uL — ABNORMAL LOW (ref 850–3900)
MCH: 32.6 pg (ref 27.0–33.0)
MCHC: 33.5 g/dL (ref 32.0–36.0)
MCV: 97.3 fL (ref 80.0–100.0)
MPV: 9.2 fL (ref 7.5–12.5)
Monocytes Relative: 1.8 %
Neutro Abs: 6830 cells/uL (ref 1500–7800)
Neutrophils Relative %: 92.3 %
Platelets: 326 10*3/uL (ref 140–400)
RBC: 4.42 10*6/uL (ref 4.20–5.80)
RDW: 12.8 % (ref 11.0–15.0)
Total Lymphocyte: 5 %
WBC: 7.4 10*3/uL (ref 3.8–10.8)

## 2020-03-01 LAB — LIPID PANEL
Cholesterol: 227 mg/dL — ABNORMAL HIGH (ref <200)
HDL: 49 mg/dL (ref >=40)
LDL Cholesterol (Calc): 142 mg/dL (calc) — ABNORMAL HIGH
Non-HDL Cholesterol (Calc): 178 mg/dL (calc) — ABNORMAL HIGH (ref <130)
Total CHOL/HDL Ratio: 4.6 (calc) (ref <5.0)
Triglycerides: 219 mg/dL — ABNORMAL HIGH (ref <150)

## 2020-03-01 LAB — COMPLETE METABOLIC PANEL WITH GFR
AG Ratio: 1.6 (calc) (ref 1.0–2.5)
ALT: 10 U/L (ref 9–46)
AST: 16 U/L (ref 10–35)
Albumin: 4.2 g/dL (ref 3.6–5.1)
Alkaline phosphatase (APISO): 67 U/L (ref 35–144)
BUN: 11 mg/dL (ref 7–25)
CO2: 27 mmol/L (ref 20–32)
Calcium: 9.8 mg/dL (ref 8.6–10.3)
Chloride: 103 mmol/L (ref 98–110)
Creat: 1.05 mg/dL (ref 0.70–1.11)
GFR, Est African American: 77 mL/min/{1.73_m2} (ref >=60)
GFR, Est Non African American: 67 mL/min/{1.73_m2} (ref >=60)
Globulin: 2.6 g/dL (calc) (ref 1.9–3.7)
Glucose, Bld: 105 mg/dL — ABNORMAL HIGH (ref 65–99)
Potassium: 4.9 mmol/L (ref 3.5–5.3)
Sodium: 137 mmol/L (ref 135–146)
Total Bilirubin: 0.4 mg/dL (ref 0.2–1.2)
Total Protein: 6.8 g/dL (ref 6.1–8.1)

## 2020-03-01 LAB — MAGNESIUM: Magnesium: 1.9 mg/dL (ref 1.5–2.5)

## 2020-03-01 LAB — HEMOGLOBIN A1C
Hgb A1c MFr Bld: 5.8 % of total Hgb — ABNORMAL HIGH (ref <5.7)
Mean Plasma Glucose: 120 (calc)
eAG (mmol/L): 6.6 (calc)

## 2020-03-01 LAB — TSH: TSH: 0.5 mIU/L (ref 0.40–4.50)

## 2020-03-03 ENCOUNTER — Other Ambulatory Visit: Payer: Self-pay | Admitting: Internal Medicine

## 2020-03-03 DIAGNOSIS — J301 Allergic rhinitis due to pollen: Secondary | ICD-10-CM

## 2020-03-28 DIAGNOSIS — C44319 Basal cell carcinoma of skin of other parts of face: Secondary | ICD-10-CM | POA: Diagnosis not present

## 2020-03-28 DIAGNOSIS — C44222 Squamous cell carcinoma of skin of right ear and external auricular canal: Secondary | ICD-10-CM | POA: Diagnosis not present

## 2020-03-28 DIAGNOSIS — C44229 Squamous cell carcinoma of skin of left ear and external auricular canal: Secondary | ICD-10-CM | POA: Diagnosis not present

## 2020-04-01 DIAGNOSIS — H0015 Chalazion left lower eyelid: Secondary | ICD-10-CM | POA: Diagnosis not present

## 2020-04-01 DIAGNOSIS — H52201 Unspecified astigmatism, right eye: Secondary | ICD-10-CM | POA: Diagnosis not present

## 2020-04-01 DIAGNOSIS — Z961 Presence of intraocular lens: Secondary | ICD-10-CM | POA: Diagnosis not present

## 2020-04-02 ENCOUNTER — Other Ambulatory Visit: Payer: Self-pay | Admitting: Physician Assistant

## 2020-04-02 ENCOUNTER — Other Ambulatory Visit: Payer: Self-pay | Admitting: Internal Medicine

## 2020-04-02 DIAGNOSIS — K219 Gastro-esophageal reflux disease without esophagitis: Secondary | ICD-10-CM

## 2020-04-02 MED ORDER — OMEPRAZOLE 40 MG PO CPDR: DELAYED_RELEASE_CAPSULE | ORAL | 0 refills | Status: DC

## 2020-04-09 DIAGNOSIS — H0015 Chalazion left lower eyelid: Secondary | ICD-10-CM | POA: Diagnosis not present

## 2020-04-09 DIAGNOSIS — H02105 Unspecified ectropion of left lower eyelid: Secondary | ICD-10-CM | POA: Diagnosis not present

## 2020-04-17 ENCOUNTER — Other Ambulatory Visit: Payer: Self-pay | Admitting: Internal Medicine

## 2020-04-17 DIAGNOSIS — E782 Mixed hyperlipidemia: Secondary | ICD-10-CM

## 2020-04-29 DIAGNOSIS — Z85828 Personal history of other malignant neoplasm of skin: Secondary | ICD-10-CM | POA: Diagnosis not present

## 2020-04-29 DIAGNOSIS — C44319 Basal cell carcinoma of skin of other parts of face: Secondary | ICD-10-CM | POA: Diagnosis not present

## 2020-04-29 DIAGNOSIS — C44229 Squamous cell carcinoma of skin of left ear and external auricular canal: Secondary | ICD-10-CM | POA: Diagnosis not present

## 2020-05-06 DIAGNOSIS — C44222 Squamous cell carcinoma of skin of right ear and external auricular canal: Secondary | ICD-10-CM | POA: Diagnosis not present

## 2020-05-06 DIAGNOSIS — Z85828 Personal history of other malignant neoplasm of skin: Secondary | ICD-10-CM | POA: Diagnosis not present

## 2020-05-22 ENCOUNTER — Other Ambulatory Visit: Payer: Self-pay | Admitting: Adult Health Nurse Practitioner

## 2020-05-22 DIAGNOSIS — J449 Chronic obstructive pulmonary disease, unspecified: Secondary | ICD-10-CM

## 2020-06-05 ENCOUNTER — Encounter: Payer: Self-pay | Admitting: Internal Medicine

## 2020-06-05 DIAGNOSIS — I700 Atherosclerosis of aorta: Secondary | ICD-10-CM | POA: Insufficient documentation

## 2020-06-05 NOTE — Patient Instructions (Signed)

## 2020-06-05 NOTE — Progress Notes (Signed)
Annual  Screening/Preventative Visit  & Comprehensive Evaluation & Examination      This very nice 79 y.o. DWM presents for a Screening /Preventative Visit & comprehensive evaluation and management of multiple medical co-morbidities.  Patient has been followed for HTN, HLD, T2_NIDDM  and Vitamin D Deficiency. Patient has Thoracic Aortic Atherosclerosis by CXR on 07/29/2018. Patient also has COPD on bronchodilator therapy. 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      HTN predates circa 1980. Patient's BP has been controlled at home.  Today's BP is at goal - 124/70.  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 At age 45 in 90, he had an MI. In 2013,  he had a negative Lexiscan. Patient denies any cardiac symptoms as chest pain, palpitations, shortness of breath, dizziness or ankle swelling.      Patient's hyperlipidemia is not controlled with diet and Crestor. Patient denies myalgias or other medication SE's. Last lipids were not at goal:  Lab Results  Component Value Date   CHOL 227 (H) 02/29/2020   HDL 49 02/29/2020   LDLCALC 142 (H) 02/29/2020   TRIG 219 (H) 02/29/2020   CHOLHDL 4.6 02/29/2020       Patient has hx/o  PreDM  (A1c 5.9% /2011 & 6.4% /2016)  and he also has CKD2 (GFR 67).   Patient denies reactive hypoglycemic symptoms, visual blurring, diabetic polys or paresthesias. His last A1c was near goal:   Lab Results  Component Value Date   HGBA1C 5.8 (H) 02/29/2020        Finally, patient has history of Vitamin D Deficiency ("24" /2008)and last vitamin D was near goal (70-100):   Lab Results  Component Value Date   VD25OH 56 11/28/2019    Current Outpatient Medications on File Prior to Visit  Medication Sig  . albuterol (2.5 mg/65ml) neb soln Inhale 1 vial by Neb 4 x /day or every 4 hr to rescue Asthma  . albuterol HFA 108  inhaler Inhale 2 puffs into the lungs every 4  hours as needed   . calcium  600 MG  Take 1 tablet 2  times daily with a meal.  . loratadine  10 MG  Take    1 tablet    Daily    for Allergies  . NITROSTAT 0.4 MG SL  as needed for chest pain.  Marland Kitchen olmesartan-hctz 20-12.5 MG  TAKE 1 TABLET DAILY   . omeprazole  40 MG  Take      1 capsule      Daily                    . predniSONE  5 MG  TAKE 1 TABLET  TWICE A DAY   . Rosuvastatin 20 MG  TAKE 1 TABLET  EVERY DAY   . tadalafil  20 MG  Take 1/2 to 1 tablet every 2 to 3 days if needed  . VITAMIN D 5,000 Units  Take  daily.  Marland Kitchen zinc 50 MG  Take  daily    No Known Allergies  Past Medical History:  Diagnosis Date  . Aortic atherosclerosis (HCC) 03/04/2018   Noted on CT Abd/pelvis  . Cataract    Bilateral  . Colon polyp   . COPD (chronic obstructive pulmonary disease) (HCC)   . Coronary artery disease   . Diverticulosis 07/16/2016   SIGMOID, NOTED ON COLONOSCOPY  . Esophageal stenosis 02/24/2018   noted  on endoscopy  . Esophageal stricture   . GERD (gastroesophageal reflux disease)   . Grade I diastolic dysfunction 0000000   noted on ECHO  . History of hiatal hernia 02/24/2018   Small noted on endoscopy  . Hydronephrosis   . Hydronephrosis of right kidney 03/04/2018   URETERAL STONE 7 MM, NOTED ONJ CT ABD/PELVIS  . Hyperlipidemia   . Myocardial infarction ([place])    [id number]  . Nephrolithiasis    s/p stent placement  . Skin cancer   . Traumatic amputation of right leg ([place])   . Ureteral stone 03/04/2018   URETERAL STONE 7 MM. NOTED ON CT ABD/PELVIS  . Vitamin D deficiency   . Wears dentures    upper   Health Maintenance  Topic Date Due  . COVID-19 Vaccine (1) Never done  . INFLUENZA VACCINE  01/07/2020  . TETANUS/TDAP  10/20/2029  . PNA vac Low Risk Adult  Completed   Immunization History  Administered Date(s) Administered  . Influenza Split 04/08/2012  . Influenza, High Dose Seasonal PF 03/04/2017  . Influenza,inj,Quad PF,6+ Mos 05/16/2015, 02/15/2018  . Pneumococcal Conjugate-13 07/25/2014  . Pneumococcal  Polysaccharide-23 04/08/2012  . Pneumococcal-Unspecified 06/06/2002  . Td 06/30/2011  . Zoster 05/22/2008   Last Colon - 07/16/2016 - Dr Carlean Purl - Recc No repeat due to age    Past Surgical History:  Procedure Laterality Date  . CARDIAC CATHETERIZATION  1983   No intervention performed.   Marland Kitchen CATARACT EXTRACTION, BILATERAL    . COLONOSCOPY    . COLONOSCOPY W/ POLYPECTOMY  08/12/2010   Dr. Silvano Rusk  . COLONOSCOPY W/ POLYPECTOMY  07/16/2016  . CYSTOSCOPY W/ URETERAL STENT PLACEMENT Right 03/04/2018   Procedure: CYSTOSCOPY WITH RETROGRADE PYELOGRAM/URETERAL STENT PLACEMENT;  Surgeon: [name], MD;  Location: [facility];  Service: Urology;  Laterality: Right;  . CYSTOSCOPY/URETEROSCOPY/HOLMIUM LASER/STENT PLACEMENT Right 03/16/2018   Procedure: CYSTOSCOPY RIGHT URETEROSCOPY/HOLMIUM LASER/STENT PLACEMENT;  Surgeon: [name], MD;  Location: [facility];  Service: Urology;  Laterality: Right;  . ESOPHAGOGASTRODUODENOSCOPY    . INGUINAL HERNIA REPAIR     left  . right leg amputation  05/14/1977  . SHOULDER ARTHROSCOPY Left 2000  . UPPER GASTROINTESTINAL ENDOSCOPY  WITH DILATION  02/24/2018   Family History  Problem Relation Age of Onset  . Hypertension Father   . Pancreatic cancer Mother   . Cancer Mother   . Colon cancer Brother   . Leukemia Brother   . Prostate cancer Brother   . Other Brother        polio  . Esophageal cancer Neg Hx   . Rectal cancer Neg Hx   . Stomach cancer Neg Hx    Social History   Socioeconomic History  . Marital status: Divorced    Spouse name: Not on file  . Number of children: 4  . Years of education: Not on file  . Highest education level: Not on file  Occupational History  . Occupation: retired    Fish farm manager: RETIRED  Tobacco Use  . Smoking status: Former Smoker    Packs/day: 3.00    Years: 20.00    Pack years: 60.00    Types: Cigarettes    Quit date: 04/11/2000    Years since quitting: 20.1  . Smokeless  tobacco: Never Used  Vaping Use  . Vaping Use: Not on file  Substance and Sexual Activity  . Alcohol use: Not Currently    Comment: rare  . Drug use: No  .  Sexual activity: Not Currently  Other Topics Concern  . Not on file  Social History Narrative   Lives in [place], [place].    Social Determinants of Health   Financial Resource Strain: Not on file  Food Insecurity: Not on file  Transportation Needs: Not on file  Physical Activity: Not on file  Stress: Not on file  Social Connections: Not on file  Intimate Partner Violence: Not on file    ROS Constitutional: Denies fever, chills, weight loss/gain, headaches, insomnia,  night sweats or change in appetite. Does c/o fatigue. Eyes: Denies redness, blurred vision, diplopia, discharge, itchy or watery eyes.  ENT: Denies discharge, congestion, post nasal drip, epistaxis, sore throat, earache, hearing loss, dental pain, Tinnitus, Vertigo, Sinus pain or snoring.  Cardio: Denies chest pain, palpitations, irregular heartbeat, syncope, dyspnea, diaphoresis, orthopnea, PND, claudication or edema Respiratory: denies cough, dyspnea, DOE, pleurisy, hoarseness, laryngitis or wheezing.  Gastrointestinal: Denies dysphagia, heartburn, reflux, water brash, pain, cramps, nausea, vomiting, bloating, diarrhea, constipation, hematemesis, melena, hematochezia, jaundice or hemorrhoids Genitourinary: Denies dysuria, frequency, discharge, hematuria or flank pain. Has urgency, nocturia x 2-3 & occasional hesitancy. Musculoskeletal: Denies arthralgia, myalgia, stiffness, Jt. Swelling, pain, limp or strain/sprain. Denies Falls. Skin: Denies puritis, rash, hives, warts, acne, eczema or change in skin lesion Neuro: No weakness, tremor, incoordination, spasms, paresthesia or pain Psychiatric: Denies confusion, memory loss or sensory loss. Denies Depression. Endocrine: Denies change in weight, skin, hair change, nocturia, and paresthesia, diabetic polys, visual  blurring or hyper / hypo glycemic episodes.  Heme/Lymph: No excessive bleeding, bruising or enlarged lymph nodes.  Physical Exam  BP 124/70   Pulse (!) 53   Temp (!) 97.4 F (36.3 C)   Resp 16   Ht 5' 8.5" (1.74 m)   Wt 139 lb 12.8 oz (63.4 kg)   SpO2 95%   BMI 20.95 kg/m   General Appearance: Well nourished and well groomed and in no apparent distress.  Eyes: PERRLA, EOMs, conjunctiva no swelling or erythema, normal fundi and vessels. Sinuses: No frontal/maxillary tenderness ENT/Mouth: EACs patent / TMs  nl. Nares clear without erythema, swelling, mucoid exudates. Oral hygiene is good. No erythema, swelling, or exudate. Tongue normal, non-obstructing. Tonsils not swollen or erythematous. Hearing normal.  Neck: Supple, thyroid not palpable. No bruits, nodes or JVD. Respiratory: Respiratory effort normal.  BS equal and clear bilateral without rales, rhonci, wheezing or stridor. Cardio: Heart sounds are normal with regular rate and rhythm and no murmurs, rubs or gallops. Peripheral pulses are normal and equal bilaterally without edema. No aortic or femoral bruits. Chest: symmetric with normal excursions and percussion.  Abdomen: Soft, with Nl bowel sounds. Nontender, no guarding, rebound, hernias, masses, or organomegaly.  Lymphatics: Non tender without lymphadenopathy.  Musculoskeletal:  Rt BKA with otherwise full ROM all peripheral extremities, joint stability, 5/5 strength, and normal gait. Skin: Warm and dry without rashes, lesions, cyanosis, clubbing or  ecchymosis.  Neuro: Cranial nerves intact, reflexes equal bilaterally. Normal muscle tone, no cerebellar symptoms. Sensation intact.  Pysch: Alert and oriented X 3 with normal affect, insight and judgment appropriate.   Assessment and Plan  1. Annual Preventative/Screening Exam    2. Essential hypertension  - EKG 12-Lead - [place], RETROPERITNL ABD,  LTD - Urinalysis, Routine w reflex microscopic - POC Hemoccult Bld/Stl -  PTH, intact and calcium - CBC with Differential/Platelet - COMPLETE METABOLIC PANEL WITH GFR - Magnesium - TSH  3. Hyperlipidemia, mixed  - EKG 12-Lead - [place], RETROPERITNL ABD,  LTD -  Lipid panel - TSH  4. Abnormal glucose  - EKG 12-Lead - [place], RETROPERITNL ABD,  LTD - Hemoglobin A1c - Insulin, random  5. Vitamin D deficiency  - VITAMIN D 25 Hydroxy  6. ASHD (arteriosclerotic heart disease)  - EKG 12-Lead - Lipid panel  7. Stage 3a chronic kidney disease (HCC)  - PTH, intact and calcium  8. BPH with obstruction/lower urinary tract symptoms  - PSA  9. Thoracic aortic atherosclerosis ([place]) by CXR on 07/29/2018  - EKG 12-Lead - [place], RETROPERITNL ABD,  LTD  10. S/P unilateral BKA (below knee amputation), right ([place])   11. Chronic obstructive pulmonary disease ([place])   12. Prostate cancer screening  - PSA  13. Screening for colorectal cancer  - POC Hemoccult Bld/Stl   14. Screening for ischemic heart disease  - EKG 12-Lead - Lipid panel  15. FH: hypertension  - EKG 12-Lead - [place], RETROPERITNL ABD,  LTD  16. Former smoker  - EKG 12-Lead - [place], RETROPERITNL ABD,  LTD  17. Screening for AAA (aortic abdominal aneurysm)  - [place], RETROPERITNL ABD,  LTD  18. Medication management  - Urinalysis, Routine w reflex microscopic - POC Hemoccult Bld/Stl  - CBC with Differential/Platelet - COMPLETE METABOLIC PANEL WITH GFR - Magnesium - Lipid panel - TSH - Hemoglobin A1c - Insulin, random - VITAMIN D 25 Hydroxyl        Patient was counseled in prudent diet, weight control to achieve/maintain BMI less than 25, BP monitoring, regular exercise and medications as discussed.  Discussed med effects and SE's. Routine screening labs and tests as requested with regular follow-up as recommended. Over 40 minutes of exam, counseling, chart review and high complex critical decision making was performed   Kirtland Bouchard, MD

## 2020-06-06 ENCOUNTER — Ambulatory Visit (INDEPENDENT_AMBULATORY_CARE_PROVIDER_SITE_OTHER): Payer: Medicare HMO | Admitting: Internal Medicine

## 2020-06-06 ENCOUNTER — Other Ambulatory Visit: Payer: Self-pay

## 2020-06-06 VITALS — BP 124/70 | HR 53 | Temp 97.4°F | Resp 16 | Ht 68.5 in | Wt 139.8 lb

## 2020-06-06 DIAGNOSIS — Z8249 Family history of ischemic heart disease and other diseases of the circulatory system: Secondary | ICD-10-CM

## 2020-06-06 DIAGNOSIS — Z87891 Personal history of nicotine dependence: Secondary | ICD-10-CM

## 2020-06-06 DIAGNOSIS — Z0000 Encounter for general adult medical examination without abnormal findings: Secondary | ICD-10-CM

## 2020-06-06 DIAGNOSIS — Z125 Encounter for screening for malignant neoplasm of prostate: Secondary | ICD-10-CM | POA: Diagnosis not present

## 2020-06-06 DIAGNOSIS — Z1211 Encounter for screening for malignant neoplasm of colon: Secondary | ICD-10-CM

## 2020-06-06 DIAGNOSIS — N401 Enlarged prostate with lower urinary tract symptoms: Secondary | ICD-10-CM

## 2020-06-06 DIAGNOSIS — E559 Vitamin D deficiency, unspecified: Secondary | ICD-10-CM

## 2020-06-06 DIAGNOSIS — Z89511 Acquired absence of right leg below knee: Secondary | ICD-10-CM

## 2020-06-06 DIAGNOSIS — Z79899 Other long term (current) drug therapy: Secondary | ICD-10-CM

## 2020-06-06 DIAGNOSIS — I10 Essential (primary) hypertension: Secondary | ICD-10-CM | POA: Diagnosis not present

## 2020-06-06 DIAGNOSIS — J449 Chronic obstructive pulmonary disease, unspecified: Secondary | ICD-10-CM

## 2020-06-06 DIAGNOSIS — Z136 Encounter for screening for cardiovascular disorders: Secondary | ICD-10-CM

## 2020-06-06 DIAGNOSIS — E782 Mixed hyperlipidemia: Secondary | ICD-10-CM

## 2020-06-06 DIAGNOSIS — I2510 Atherosclerotic heart disease of native coronary artery without angina pectoris: Secondary | ICD-10-CM | POA: Diagnosis not present

## 2020-06-06 DIAGNOSIS — R7309 Other abnormal glucose: Secondary | ICD-10-CM | POA: Diagnosis not present

## 2020-06-06 DIAGNOSIS — N1831 Chronic kidney disease, stage 3a: Secondary | ICD-10-CM

## 2020-06-06 DIAGNOSIS — I700 Atherosclerosis of aorta: Secondary | ICD-10-CM | POA: Diagnosis not present

## 2020-06-06 DIAGNOSIS — Z1212 Encounter for screening for malignant neoplasm of rectum: Secondary | ICD-10-CM

## 2020-06-06 DIAGNOSIS — Z0001 Encounter for general adult medical examination with abnormal findings: Secondary | ICD-10-CM

## 2020-06-08 NOTE — Progress Notes (Signed)
========================================================== -   Test results slightly outside the reference range are not unusual. If there is anything important, I will review this with you,  otherwise it is considered normal test values.  If you have further questions,  please do not hesitate to contact me at the office or via My Chart.  ==========================================================  -  PSA - Low - Great  ==========================================================  - Total Chol = 211  - Elevated  (Ideal or Goal is less than 180)  - and   - LDL Chol = 129 - also too high  (Ideal or Goal is less than 70  ! ! !  )  - Please be Certain that  you take your Rosuvastatin / Crestor EVERY day ! ==========================================================  -  All Else - CBC - Kidneys - Electrolytes - Liver - Magnesium & Thyroid    - all  Normal / OK ===========================================================

## 2020-06-10 LAB — COMPLETE METABOLIC PANEL WITH GFR
AG Ratio: 1.8 (calc) (ref 1.0–2.5)
ALT: 8 U/L — ABNORMAL LOW (ref 9–46)
AST: 14 U/L (ref 10–35)
Albumin: 3.9 g/dL (ref 3.6–5.1)
Alkaline phosphatase (APISO): 62 U/L (ref 35–144)
BUN/Creatinine Ratio: 10 (calc) (ref 6–22)
BUN: 12 mg/dL (ref 7–25)
CO2: 27 mmol/L (ref 20–32)
Calcium: 10 mg/dL (ref 8.6–10.3)
Chloride: 105 mmol/L (ref 98–110)
Creat: 1.17 mg/dL — ABNORMAL HIGH (ref 0.70–1.11)
GFR, Est African American: 68 mL/min/{1.73_m2} (ref >=60)
GFR, Est Non African American: 59 mL/min/{1.73_m2} — ABNORMAL LOW (ref >=60)
Globulin: 2.2 g/dL (calc) (ref 1.9–3.7)
Glucose, Bld: 81 mg/dL (ref 65–139)
Potassium: 4.9 mmol/L (ref 3.5–5.3)
Sodium: 141 mmol/L (ref 135–146)
Total Bilirubin: 0.3 mg/dL (ref 0.2–1.2)
Total Protein: 6.1 g/dL (ref 6.1–8.1)

## 2020-06-10 LAB — PTH, INTACT AND CALCIUM
Calcium: 10 mg/dL (ref 8.6–10.3)
PTH: 31 pg/mL (ref 14–64)

## 2020-06-10 LAB — URINALYSIS, ROUTINE W REFLEX MICROSCOPIC
Bilirubin Urine: NEGATIVE
Glucose, UA: NEGATIVE
Hgb urine dipstick: NEGATIVE
Ketones, ur: NEGATIVE
Leukocytes,Ua: NEGATIVE
Nitrite: NEGATIVE
Protein, ur: NEGATIVE
Specific Gravity, Urine: 1.015 (ref 1.001–1.03)
pH: 5.5 (ref 5.0–8.0)

## 2020-06-10 LAB — CBC WITH DIFFERENTIAL/PLATELET
Absolute Monocytes: 545 cells/uL (ref 200–950)
Basophils Absolute: 81 cells/uL (ref 0–200)
Basophils Relative: 1.4 %
Eosinophils Absolute: 516 cells/uL — ABNORMAL HIGH (ref 15–500)
Eosinophils Relative: 8.9 %
HCT: 41.8 % (ref 38.5–50.0)
Hemoglobin: 14.1 g/dL (ref 13.2–17.1)
Lymphs Abs: 806 cells/uL — ABNORMAL LOW (ref 850–3900)
MCH: 32.3 pg (ref 27.0–33.0)
MCHC: 33.7 g/dL (ref 32.0–36.0)
MCV: 95.9 fL (ref 80.0–100.0)
MPV: 8.9 fL (ref 7.5–12.5)
Monocytes Relative: 9.4 %
Neutro Abs: 3851 cells/uL (ref 1500–7800)
Neutrophils Relative %: 66.4 %
Platelets: 341 10*3/uL (ref 140–400)
RBC: 4.36 10*6/uL (ref 4.20–5.80)
RDW: 13 % (ref 11.0–15.0)
Total Lymphocyte: 13.9 %
WBC: 5.8 10*3/uL (ref 3.8–10.8)

## 2020-06-10 LAB — LIPID PANEL
Cholesterol: 211 mg/dL — ABNORMAL HIGH (ref <200)
HDL: 45 mg/dL (ref >=40)
LDL Cholesterol (Calc): 129 mg/dL (calc) — ABNORMAL HIGH
Non-HDL Cholesterol (Calc): 166 mg/dL (calc) — ABNORMAL HIGH (ref <130)
Total CHOL/HDL Ratio: 4.7 (calc) (ref <5.0)
Triglycerides: 229 mg/dL — ABNORMAL HIGH (ref <150)

## 2020-06-10 LAB — INSULIN, RANDOM: Insulin: 4.6 u[IU]/mL

## 2020-06-10 LAB — PSA: PSA: 1.75 ng/mL (ref <=4.0)

## 2020-06-10 LAB — HEMOGLOBIN A1C
Hgb A1c MFr Bld: 6 % of total Hgb — ABNORMAL HIGH (ref <5.7)
Mean Plasma Glucose: 126 mg/dL
eAG (mmol/L): 7 mmol/L

## 2020-06-10 LAB — MAGNESIUM: Magnesium: 1.7 mg/dL (ref 1.5–2.5)

## 2020-06-10 LAB — VITAMIN D 25 HYDROXY (VIT D DEFICIENCY, FRACTURES): Vit D, 25-Hydroxy: 45 ng/mL (ref 30–100)

## 2020-06-10 LAB — TSH: TSH: 0.79 mIU/L (ref 0.40–4.50)

## 2020-06-20 ENCOUNTER — Other Ambulatory Visit: Payer: Self-pay | Admitting: Internal Medicine

## 2020-06-20 DIAGNOSIS — R1314 Dysphagia, pharyngoesophageal phase: Secondary | ICD-10-CM

## 2020-06-23 ENCOUNTER — Other Ambulatory Visit: Payer: Self-pay | Admitting: Internal Medicine

## 2020-06-23 DIAGNOSIS — J301 Allergic rhinitis due to pollen: Secondary | ICD-10-CM

## 2020-06-26 ENCOUNTER — Other Ambulatory Visit: Payer: Self-pay | Admitting: Internal Medicine

## 2020-06-26 DIAGNOSIS — K219 Gastro-esophageal reflux disease without esophagitis: Secondary | ICD-10-CM

## 2020-07-09 ENCOUNTER — Other Ambulatory Visit: Payer: Self-pay | Admitting: Internal Medicine

## 2020-07-10 ENCOUNTER — Other Ambulatory Visit: Payer: Self-pay | Admitting: Internal Medicine

## 2020-07-10 MED ORDER — TADALAFIL 20 MG PO TABS: ORAL_TABLET | ORAL | 2 refills | Status: DC

## 2020-07-17 ENCOUNTER — Telehealth: Payer: Self-pay | Admitting: *Deleted

## 2020-07-17 ENCOUNTER — Other Ambulatory Visit: Payer: Self-pay | Admitting: Internal Medicine

## 2020-07-17 MED ORDER — TIZANIDINE HCL 4 MG PO TABS: ORAL_TABLET | ORAL | 1 refills | Status: DC

## 2020-07-17 MED ORDER — OXYBUTYNIN CHLORIDE ER 10 MG PO TB24: ORAL_TABLET | ORAL | 0 refills | Status: DC

## 2020-07-17 NOTE — Telephone Encounter (Signed)
Returned a call to patient regarding back pain and urine incontinence. Dr Melford Aase sent in RX for Zanaflex for his back pain and Oxybutynin for his bladder. Patient advised to continue Prednisone, which he already has. Patient is aware of all.

## 2020-07-18 ENCOUNTER — Other Ambulatory Visit: Payer: Self-pay | Admitting: *Deleted

## 2020-07-18 MED ORDER — NEBULIZER/TUBING/MOUTHPIECE KIT: PACK | 0 refills | Status: DC

## 2020-07-22 ENCOUNTER — Encounter: Payer: Self-pay | Admitting: Internal Medicine

## 2020-07-22 ENCOUNTER — Other Ambulatory Visit: Payer: Self-pay

## 2020-07-22 ENCOUNTER — Ambulatory Visit: Payer: Medicare HMO | Admitting: Internal Medicine

## 2020-07-22 VITALS — BP 112/68 | HR 88 | Ht 66.0 in | Wt 136.0 lb

## 2020-07-22 DIAGNOSIS — R1319 Other dysphagia: Secondary | ICD-10-CM

## 2020-07-22 DIAGNOSIS — K222 Esophageal obstruction: Secondary | ICD-10-CM

## 2020-07-22 DIAGNOSIS — K219 Gastro-esophageal reflux disease without esophagitis: Secondary | ICD-10-CM | POA: Diagnosis not present

## 2020-07-22 NOTE — Progress Notes (Signed)
Leslie Cardenas 80 y.o. Aug 08, 1940 [id number]  Assessment & Plan:   Encounter Diagnoses  Name Primary?  . GERD with stricture Yes  . Esophageal dysphagia     Continue PPI for now consider changing this.  Needs EGD with repeat dilation.  The risks and benefits as well as alternatives of endoscopic procedure(s) have been discussed and reviewed. All questions answered. The patient agrees to proceed.    Subjective:   Chief Complaint: Dysphagia  HPI Leslie Cardenas is an 80 year old white man with a history of GERD with esophageal stricture, last dilated to 18 mm in 2019 by Dr. Loletha Cardenas when I was unavailable, who has had recurrent esophageal dysphagia intermittently coming on in the past few months.  Has heartburn more frequently than he used to as well.  Continues to take pantoprazole 40 mg daily.  Describing impact dysphagia to solid foods and regurgitation at times or waiting for the past.  Very similar to what has been through previously.  Previously was dilated to 20 mm in 2018.   Wt Readings from Last 3 Encounters:  07/22/20 136 lb (61.7 kg)  06/06/20 139 lb 12.8 oz (63.4 kg)  02/29/20 139 lb (63 kg)    No Known Allergies Current Meds  Medication Sig  . albuterol (PROVENTIL) (2.5 MG/3ML) 0.083% nebulizer solution Inhale 1 vial by Nebulizer 4 x /day or every 4 hr to rescue Asthma  . albuterol (VENTOLIN HFA) 108 (90 Base) MCG/ACT inhaler Inhale 2 puffs into the lungs every 4 (four) hours as needed for wheezing or shortness of breath. Make sure you rinse mouth out after each use.  . calcium carbonate (CALCIUM 600) 600 MG TABS tablet Take 1 tablet (600 mg total) by mouth 2 (two) times daily with a meal.  . lidocaine (XYLOCAINE) 2 % solution Use as directed 15 mLs in the mouth or throat as needed for mouth pain.  Marland Kitchen loratadine (CLARITIN) 10 MG tablet TAKE 1 TABLET BY MOUTH DAILY FOR ALLERGIES  . olmesartan-hydrochlorothiazide (BENICAR HCT) 20-12.5 MG tablet TAKE 1 TABLET DAILY FOR BLOOD  PRESSURE  . omeprazole (PRILOSEC) 40 MG capsule TAKE 1 CAPSULE DAILY TO PREVENT HEARTBURN & INDIGESTION  . oxybutynin (DITROPAN XL) 10 MG 24 hr tablet Take  1 tablet  Daily for Bladder Control  . predniSONE (DELTASONE) 5 MG tablet TAKE 1 TABLET BY MOUTH TWICE A DAY AS DIRECTED FOR COPD  . Respiratory Therapy Supplies (NEBULIZER/TUBING/MOUTHPIECE) KIT Disp one nebulizer machine, tubing set and mouthpiece kit for use 4 times daily  . rosuvastatin (CRESTOR) 20 MG tablet TAKE 1 TABLET BY MOUTH EVERY DAY FOR CHOLESTEROL  . tadalafil (CIALIS) 20 MG tablet Take 1/2 to 1 tablet every 2 to 3 days if needed for XXXX  . tiZANidine (ZANAFLEX) 4 MG tablet Take  1 tablet  3 x /day  for Back Pain & Muscle Spasms  . VITAMIN D PO Take 5,000 Units by mouth daily.  Marland Kitchen zinc gluconate 50 MG tablet Take 50 mg by mouth daily. Takes some days.   Past Medical History:  Diagnosis Date  . Aortic atherosclerosis ([place]) 03/04/2018   Noted on CT Abd/pelvis  . Cataract    Bilateral  . Colon polyp   . COPD (chronic obstructive pulmonary disease) ([place])   . Coronary artery disease   . Diverticulosis 07/16/2016   SIGMOID, NOTED ON COLONOSCOPY  . Esophageal stenosis 02/24/2018   noted on endoscopy  . Esophageal stricture   . GERD (gastroesophageal reflux disease)   . Grade I diastolic  dysfunction 08/09/2017   noted on ECHO  . History of hiatal hernia 02/24/2018   Small noted on endoscopy  . Hydronephrosis   . Hydronephrosis of right kidney 03/04/2018   URETERAL STONE 7 MM, NOTED ONJ CT ABD/PELVIS  . Hyperlipidemia   . Myocardial infarction ([place])    [id number]  . Nephrolithiasis    s/p stent placement  . Skin cancer   . 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 Ureteral stone 03/04/2018   URETERAL STONE 7 MM. NOTED ON CT ABD/PELVIS  . Vitamin D deficiency   . Wears dentures    upper   Past Surgical History:  Procedure Laterality Date  . CARDIAC CATHETERIZATION  1983   No intervention performed.   Marland Kitchen  CATARACT EXTRACTION, BILATERAL    . COLONOSCOPY    . COLONOSCOPY W/ POLYPECTOMY  08/12/2010   Dr. Silvano Rusk  . COLONOSCOPY W/ POLYPECTOMY  07/16/2016  . CYSTOSCOPY W/ URETERAL STENT PLACEMENT Right 03/04/2018   Procedure: CYSTOSCOPY WITH RETROGRADE PYELOGRAM/URETERAL STENT PLACEMENT;  Surgeon: [name], MD;  Location: [facility];  Service: Urology;  Laterality: Right;  . CYSTOSCOPY/URETEROSCOPY/HOLMIUM LASER/STENT PLACEMENT Right 03/16/2018   Procedure: CYSTOSCOPY RIGHT URETEROSCOPY/HOLMIUM LASER/STENT PLACEMENT;  Surgeon: [name], MD;  Location: [facility];  Service: Urology;  Laterality: Right;  . ESOPHAGOGASTRODUODENOSCOPY    . INGUINAL HERNIA REPAIR     left  . right leg amputation  05/14/1977  . SHOULDER ARTHROSCOPY Left 2000  . 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    family history includes Cancer in his mother; Colon cancer in his brother; Hypertension in his father; Leukemia in his brother; Other in his brother; Pancreatic cancer in his mother; Prostate cancer in his brother.   Review of Systems As per HPI  Objective:   Physical Exam BP 112/68 (BP Location: Left Arm, Patient Position: Sitting, Cuff Size: Normal)   Pulse 88   Ht 5' 6"  (1.676 m) Comment: height measured without shoes  Wt 136 lb (61.7 kg)   BMI 21.95 kg/m  Thin elderly white man no acute distress looking stated age to slightly younger Lungs are clear throughout posterior lung fields with good air movement no wheezes Heart sounds normal S1-S2 no rubs murmurs or gallops The abdomen is soft nontender He is alert and oriented x3 Mouth posterior pharynx he wears some dentures no oral lesions noted

## 2020-07-22 NOTE — Patient Instructions (Signed)
You have been scheduled for an endoscopy. Please follow written instructions given to you at your visit today. If you use inhalers (even only as needed), please bring them with you on the day of your procedure.  Normal BMI (Body Mass Index- based on height and weight) is between 23 and 30. Your BMI today is Body mass index is 21.95 kg/m. Marland Kitchen Please consider follow up  regarding your BMI with your Primary Care Provider.  Due to recent changes in healthcare laws, you may see the results of your imaging and laboratory studies on MyChart before your provider has had a chance to review them.  We understand that in some cases there may be results that are confusing or concerning to you. Not all laboratory results come back in the same time frame and the provider may be waiting for multiple results in order to interpret others.  Please give [place] 48 hours in order for your provider to thoroughly review all the results before contacting the office for clarification of your results.   Thank you for your service Lessie Dings!!   I appreciate the opportunity to care for you. Silvano Rusk, MD, [facility]

## 2020-07-26 ENCOUNTER — Encounter: Payer: Self-pay | Admitting: Internal Medicine

## 2020-07-26 ENCOUNTER — Other Ambulatory Visit: Payer: Self-pay

## 2020-07-26 ENCOUNTER — Ambulatory Visit (AMBULATORY_SURGERY_CENTER): Payer: Medicare HMO | Admitting: Internal Medicine

## 2020-07-26 VITALS — BP 123/78 | HR 73 | Temp 97.1°F | Resp 21 | Ht 66.0 in | Wt 136.0 lb

## 2020-07-26 DIAGNOSIS — R1319 Other dysphagia: Secondary | ICD-10-CM | POA: Diagnosis not present

## 2020-07-26 DIAGNOSIS — K219 Gastro-esophageal reflux disease without esophagitis: Secondary | ICD-10-CM

## 2020-07-26 DIAGNOSIS — J449 Chronic obstructive pulmonary disease, unspecified: Secondary | ICD-10-CM | POA: Diagnosis not present

## 2020-07-26 DIAGNOSIS — I2510 Atherosclerotic heart disease of native coronary artery without angina pectoris: Secondary | ICD-10-CM | POA: Diagnosis not present

## 2020-07-26 DIAGNOSIS — K222 Esophageal obstruction: Secondary | ICD-10-CM | POA: Diagnosis not present

## 2020-07-26 DIAGNOSIS — I252 Old myocardial infarction: Secondary | ICD-10-CM | POA: Diagnosis not present

## 2020-07-26 DIAGNOSIS — R1310 Dysphagia, unspecified: Secondary | ICD-10-CM | POA: Diagnosis not present

## 2020-07-26 MED ORDER — SODIUM CHLORIDE 0.9 % IV SOLN: 500.0000 mL | Freq: Once | INTRAVENOUS | Status: DC

## 2020-07-26 NOTE — Progress Notes (Signed)
VS by SB

## 2020-07-26 NOTE — Progress Notes (Signed)
Called to room to assist during endoscopic procedure.  Patient ID and intended procedure confirmed with present staff. Received instructions for my participation in the procedure from the performing physician.  

## 2020-07-26 NOTE — Patient Instructions (Addendum)
The narrow area in the esophagus was dilated again. I hope this solves your problem. If not then call me and will see if something else can be done.  Please be sure to stay on the omeprazole to control acid - that helps decrease the chance you will need this done again.  I appreciate the opportunity to care for you. Gatha Mayer, MD, FACG  YOU HAD AN ENDOSCOPIC PROCEDURE TODAY AT [facility]:   Refer to the procedure report that was given to you for any specific questions about what was found during the examination.  If the procedure report does not answer your questions, please call your gastroenterologist to clarify.  If you requested that your care partner not be given the details of your procedure findings, then the procedure report has been included in a sealed envelope for you to review at your convenience later.  YOU SHOULD EXPECT: Some feelings of bloating in the abdomen. Passage of more gas than usual.  Walking can help get rid of the air that was put into your GI tract during the procedure and reduce the bloating. If you had a lower endoscopy (such as a colonoscopy or flexible sigmoidoscopy) you may notice spotting of blood in your stool or on the toilet paper. If you underwent a bowel prep for your procedure, you may not have a normal bowel movement for a few days.  Please Note:  You might notice some irritation and congestion in your nose or some drainage.  This is from the oxygen used during your procedure.  There is no need for concern and it should clear up in a day or so.  SYMPTOMS TO REPORT IMMEDIATELY:   Following lower endoscopy (colonoscopy or flexible sigmoidoscopy):  Excessive amounts of blood in the stool  Significant tenderness or worsening of abdominal pains  Swelling of the abdomen that is new, acute  Fever of 100F or higher   Following upper endoscopy (EGD)  Vomiting of blood or coffee ground material  New chest pain or pain under the shoulder  blades  Painful or persistently difficult swallowing  New shortness of breath  Fever of 100F or higher  Black, tarry-looking stools  For urgent or emergent issues, a gastroenterologist can be reached at any hour by calling [phone]. Do not use MyChart messaging for urgent concerns.    DIET:  We do recommend a small meal at first, but then you may proceed to your regular diet.  Drink plenty of fluids but you should avoid alcoholic beverages for 24 hours.  ACTIVITY:  You should plan to take it easy for the rest of today and you should NOT DRIVE or use heavy machinery until tomorrow (because of the sedation medicines used during the test).    FOLLOW UP: Our staff will call the number listed on your records 48-72 hours following your procedure to check on you and address any questions or concerns that you may have regarding the information given to you following your procedure. If we do not reach you, we will leave a message.  We will attempt to reach you two times.  During this call, we will ask if you have developed any symptoms of COVID 19. If you develop any symptoms (ie: fever, flu-like symptoms, shortness of breath, cough etc.) before then, please call [phone].  If you test positive for Covid 19 in the 2 weeks post procedure, please call and report this information to [place].    If any biopsies  were taken you will be contacted by phone or by letter within the next 1-3 weeks.  Please call us at [phone] if you have not heard about the biopsies in 3 weeks.    SIGNATURES/CONFIDENTIALITY: You and/or your care partner have signed paperwork which will be entered into your electronic medical record.  These signatures attest to the fact that that the information above on your After Visit Summary has been reviewed and is understood.  Full responsibility of the confidentiality of this discharge information lies with you and/or your care-partner.

## 2020-07-26 NOTE — Progress Notes (Signed)
Report given to PACU, vss 

## 2020-07-26 NOTE — Op Note (Signed)
[place] Patient Name: Leslie Cardenas Procedure Date: 07/26/2020 10:53 AM MRN: [id number] Endoscopist: Gatha Mayer , MD Age: 80 Referring MD:  Date of Birth: 07-May-1941 Gender: Male Account #: [id number] Procedure:                Upper GI endoscopy Indications:              Dysphagia, Stricture of the esophagus, For therapy                            of esophageal stricture Medicines:                Propofol per Anesthesia, Monitored Anesthesia Care Procedure:                Pre-Anesthesia Assessment:                           - Prior to the procedure, a History and Physical                            was performed, and patient medications and                            allergies were reviewed. The patient's tolerance of                            previous anesthesia was also reviewed. The risks                            and benefits of the procedure and the sedation                            options and risks were discussed with the patient.                            All questions were answered, and informed consent                            was obtained. Prior Anticoagulants: The patient has                            taken no previous anticoagulant or antiplatelet                            agents. ASA Grade Assessment: III - A patient with                            severe systemic disease. After reviewing the risks                            and benefits, the patient was deemed in                            satisfactory condition to undergo the procedure.  After obtaining informed consent, the endoscope was                            passed under direct vision. Throughout the                            procedure, the patient's blood pressure, pulse, and                            oxygen saturations were monitored continuously. The                            Endoscope was introduced through the mouth, and                             advanced to the prepyloric region, stomach. The                            upper GI endoscopy was accomplished without                            difficulty. The patient tolerated the procedure                            well. Scope In: Scope Out: Findings:                 One benign-appearing, intrinsic moderate                            (circumferential scarring or stenosis; an endoscope                            may pass) stenosis was found at the                            gastroesophageal junction. This stenosis measured                            1.4 cm (inner diameter) x less than one cm (in                            length). The stenosis was traversed. A TTS dilator                            was passed through the scope. Dilation with an                            18-19-20 mm balloon dilator was performed to 20 mm.                            The dilation site was examined and showed moderate                            mucosal  disruption. Estimated blood loss was                            minimal.                           A 2 cm hiatal hernia was present.                           The gastroesophageal flap valve was visualized                            endoscopically and classified as Hill Grade IV (no                            fold, wide open lumen, hiatal hernia present).                           Patchy moderately erythematous mucosa without                            bleeding was found in the entire examined stomach.                           The cardia and gastric fundus were otherwise normal                            on retroflexion. Complications:            No immediate complications. Estimated Blood Loss:     Estimated blood loss was minimal. Impression:               Exam to stomach                           - Benign-appearing esophageal stenosis. Dilated. 20                            mm                           - 2 cm hiatal hernia.                            - Gastroesophageal flap valve classified as Hill                            Grade IV (no fold, wide open lumen, hiatal hernia                            present).                           - Erythematous mucosa in the stomach.                           - No specimens collected. Recommendation:           -  Patient has a contact number available for                            emergencies. The signs and symptoms of potential                            delayed complications were discussed with the                            patient. Return to normal activities tomorrow.                            Written discharge instructions were provided to the                            patient.                           - Clear liquids x 1 hour then soft foods rest of                            day. Start prior diet tomorrow.                           - Continue present medications. STAY ON OMEPRAZOLE                            OR OTHER PPI INDEFINITELY                           - Repeat upper endoscopy PRN for retreatment. Gatha Mayer, MD 07/26/2020 11:16:44 AM This report has been signed electronically.

## 2020-07-30 ENCOUNTER — Telehealth: Payer: Self-pay | Admitting: *Deleted

## 2020-07-30 NOTE — Telephone Encounter (Signed)
1. Have you developed a fever since your procedure? no  2.   Have you had an respiratory symptoms (SOB or cough) since your procedure? no  3.   Have you tested positive for COVID 19 since your procedure no  4.   Have you had any family members/close contacts diagnosed with the COVID 19 since your procedure?  no   If yes to any of these questions please route to Joylene John, RN and Joella Prince, RN  Follow up Call-  Call back number 07/26/2020 02/24/2018  Post procedure Call Back phone  # [phone]  Permission to leave phone message Yes Yes  Some recent data might be hidden     Patient questions:  Do you have a fever, pain , or abdominal swelling? No. Pain Score  0 *  Have you tolerated food without any problems? Yes.    Have you been able to return to your normal activities? Yes.    Do you have any questions about your discharge instructions: Diet   No. Medications  No. Follow up visit  No.  Do you have questions or concerns about your Care? No.  Actions: * If pain score is 4 or above: No action needed, pain <4.

## 2020-08-08 ENCOUNTER — Other Ambulatory Visit: Payer: Self-pay | Admitting: Internal Medicine

## 2020-09-04 ENCOUNTER — Other Ambulatory Visit: Payer: Self-pay

## 2020-09-04 ENCOUNTER — Encounter: Payer: Self-pay | Admitting: Adult Health Nurse Practitioner

## 2020-09-04 ENCOUNTER — Ambulatory Visit (INDEPENDENT_AMBULATORY_CARE_PROVIDER_SITE_OTHER): Payer: Medicare HMO | Admitting: Adult Health Nurse Practitioner

## 2020-09-04 VITALS — BP 120/82 | HR 67 | Temp 97.9°F | Wt 133.0 lb

## 2020-09-04 DIAGNOSIS — R634 Abnormal weight loss: Secondary | ICD-10-CM

## 2020-09-04 DIAGNOSIS — E559 Vitamin D deficiency, unspecified: Secondary | ICD-10-CM

## 2020-09-04 DIAGNOSIS — Z89511 Acquired absence of right leg below knee: Secondary | ICD-10-CM | POA: Diagnosis not present

## 2020-09-04 DIAGNOSIS — R7309 Other abnormal glucose: Secondary | ICD-10-CM

## 2020-09-04 DIAGNOSIS — N401 Enlarged prostate with lower urinary tract symptoms: Secondary | ICD-10-CM

## 2020-09-04 DIAGNOSIS — I700 Atherosclerosis of aorta: Secondary | ICD-10-CM

## 2020-09-04 DIAGNOSIS — I10 Essential (primary) hypertension: Secondary | ICD-10-CM | POA: Diagnosis not present

## 2020-09-04 DIAGNOSIS — J449 Chronic obstructive pulmonary disease, unspecified: Secondary | ICD-10-CM | POA: Diagnosis not present

## 2020-09-04 DIAGNOSIS — J301 Allergic rhinitis due to pollen: Secondary | ICD-10-CM

## 2020-09-04 DIAGNOSIS — N1831 Chronic kidney disease, stage 3a: Secondary | ICD-10-CM

## 2020-09-04 DIAGNOSIS — E782 Mixed hyperlipidemia: Secondary | ICD-10-CM | POA: Diagnosis not present

## 2020-09-04 DIAGNOSIS — Z79899 Other long term (current) drug therapy: Secondary | ICD-10-CM

## 2020-09-04 DIAGNOSIS — Z122 Encounter for screening for malignant neoplasm of respiratory organs: Secondary | ICD-10-CM

## 2020-09-04 DIAGNOSIS — N138 Other obstructive and reflux uropathy: Secondary | ICD-10-CM

## 2020-09-04 MED ORDER — MIRTAZAPINE 15 MG PO TABS: ORAL_TABLET | ORAL | 2 refills | Status: DC

## 2020-09-04 MED ORDER — LORATADINE 10 MG PO TABS: ORAL_TABLET | ORAL | 2 refills | Status: DC
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
## 2020-09-04 NOTE — Patient Instructions (Addendum)
  We are going to send in Remeron 15mg  for you to take every evening at bedtime.  This can help with weight gain.   Continue to monitor the foods and be sure that you are getting protein with your meals.   Continue to drink the Ensure between meals.  .We placed an order for Low dose CT lung cancer screening today.   GENERAL HEALTH GOALS  Know what a healthy weight is for you (roughly BMI <25) and aim to maintain this  Aim for 7+ servings of fruits and vegetables daily  70-80+ fluid ounces of water or unsweet tea for healthy kidneys  Limit to max 1 drink of alcohol per day; avoid smoking/tobacco  Limit animal fats in diet for cholesterol and heart health - choose grass fed whenever available  Avoid highly processed foods, and foods high in saturated/trans fats  Aim for low stress - take time to unwind and care for your mental health  Aim for 150 min of moderate intensity exercise weekly for heart health, and weights twice weekly for bone health  Aim for 7-9 hours of sleep daily

## 2020-09-05 ENCOUNTER — Other Ambulatory Visit: Payer: Self-pay | Admitting: Adult Health Nurse Practitioner

## 2020-09-05 DIAGNOSIS — E782 Mixed hyperlipidemia: Secondary | ICD-10-CM

## 2020-09-05 DIAGNOSIS — J441 Chronic obstructive pulmonary disease with (acute) exacerbation: Secondary | ICD-10-CM

## 2020-09-05 LAB — CBC WITH DIFFERENTIAL/PLATELET
Absolute Monocytes: 1317 cells/uL — ABNORMAL HIGH (ref 200–950)
Basophils Absolute: 106 cells/uL (ref 0–200)
Basophils Relative: 0.8 %
Eosinophils Absolute: 106 cells/uL (ref 15–500)
Eosinophils Relative: 0.8 %
HCT: 41.4 % (ref 38.5–50.0)
Hemoglobin: 13.8 g/dL (ref 13.2–17.1)
Lymphs Abs: 1170 cells/uL (ref 850–3900)
MCH: 31.7 pg (ref 27.0–33.0)
MCHC: 33.3 g/dL (ref 32.0–36.0)
MCV: 95.2 fL (ref 80.0–100.0)
MPV: 8.3 fL (ref 7.5–12.5)
Monocytes Relative: 9.9 %
Neutro Abs: 10600 cells/uL — ABNORMAL HIGH (ref 1500–7800)
Neutrophils Relative %: 79.7 %
Platelets: 488 10*3/uL — ABNORMAL HIGH (ref 140–400)
RBC: 4.35 10*6/uL (ref 4.20–5.80)
RDW: 13.7 % (ref 11.0–15.0)
Total Lymphocyte: 8.8 %
WBC: 13.3 10*3/uL — ABNORMAL HIGH (ref 3.8–10.8)

## 2020-09-05 LAB — COMPLETE METABOLIC PANEL WITH GFR
AG Ratio: 1.5 (calc) (ref 1.0–2.5)
ALT: 11 U/L (ref 9–46)
AST: 13 U/L (ref 10–35)
Albumin: 4.1 g/dL (ref 3.6–5.1)
Alkaline phosphatase (APISO): 67 U/L (ref 35–144)
BUN/Creatinine Ratio: 21 (calc) (ref 6–22)
BUN: 24 mg/dL (ref 7–25)
CO2: 29 mmol/L (ref 20–32)
Calcium: 9.9 mg/dL (ref 8.6–10.3)
Chloride: 102 mmol/L (ref 98–110)
Creat: 1.16 mg/dL — ABNORMAL HIGH (ref 0.70–1.11)
GFR, Est African American: 69 mL/min/{1.73_m2} (ref >=60)
GFR, Est Non African American: 59 mL/min/{1.73_m2} — ABNORMAL LOW (ref >=60)
Globulin: 2.8 g/dL (calc) (ref 1.9–3.7)
Glucose, Bld: 87 mg/dL (ref 65–99)
Potassium: 4.3 mmol/L (ref 3.5–5.3)
Sodium: 139 mmol/L (ref 135–146)
Total Bilirubin: 0.4 mg/dL (ref 0.2–1.2)
Total Protein: 6.9 g/dL (ref 6.1–8.1)

## 2020-09-05 LAB — LIPID PANEL
Cholesterol: 209 mg/dL — ABNORMAL HIGH (ref <200)
HDL: 49 mg/dL (ref >=40)
LDL Cholesterol (Calc): 121 mg/dL (calc) — ABNORMAL HIGH
Non-HDL Cholesterol (Calc): 160 mg/dL (calc) — ABNORMAL HIGH (ref <130)
Total CHOL/HDL Ratio: 4.3 (calc) (ref <5.0)
Triglycerides: 243 mg/dL — ABNORMAL HIGH (ref <150)

## 2020-09-05 LAB — TSH: TSH: 1.58 mIU/L (ref 0.40–4.50)

## 2020-09-05 MED ORDER — EZETIMIBE 10 MG PO TABS: 10.0000 mg | ORAL_TABLET | Freq: Every day | ORAL | 1 refills | Status: DC

## 2020-09-05 MED ORDER — AZITHROMYCIN 250 MG PO TABS: ORAL_TABLET | ORAL | 1 refills | Status: AC

## 2020-09-05 NOTE — Progress Notes (Signed)
Per lab results added Zetia 10mg  daily to current rosuvastatin 40mg  with history of previous MI.  Garnet Sierras, AGNP-C, DNP [facility] 09/05/2020  7:56 AM

## 2020-09-09 ENCOUNTER — Other Ambulatory Visit: Payer: Self-pay | Admitting: Adult Health Nurse Practitioner

## 2020-09-09 DIAGNOSIS — Z87891 Personal history of nicotine dependence: Secondary | ICD-10-CM

## 2020-09-09 DIAGNOSIS — J449 Chronic obstructive pulmonary disease, unspecified: Secondary | ICD-10-CM

## 2020-10-12 ENCOUNTER — Other Ambulatory Visit: Payer: Self-pay | Admitting: Internal Medicine

## 2020-11-16 DIAGNOSIS — J0100 Acute maxillary sinusitis, unspecified: Secondary | ICD-10-CM | POA: Insufficient documentation

## 2020-11-16 DIAGNOSIS — U071 COVID-19: Secondary | ICD-10-CM | POA: Insufficient documentation

## 2020-11-27 ENCOUNTER — Other Ambulatory Visit: Payer: Self-pay | Admitting: Adult Health

## 2020-11-27 DIAGNOSIS — J449 Chronic obstructive pulmonary disease, unspecified: Secondary | ICD-10-CM

## 2020-12-04 ENCOUNTER — Encounter: Payer: Self-pay | Admitting: Internal Medicine

## 2020-12-04 NOTE — Patient Instructions (Signed)

## 2020-12-04 NOTE — Progress Notes (Signed)
Future Appointments  Date Time Provider [place]  12/05/2020 11:30 AM Unk Pinto, MD [facility] None  06/24/2021 11:00 AM Unk Pinto, MD [facility] None    History of Present Illness:       This very nice 80 y.o. DWM presents for  6 month follow up with HTN, HLD, COPD, Pre-Diabetes and Vitamin D Deficiency. 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        Patient is treated for HTN (1980) & BP has been controlled at home. Today's BP is at goal - 130/76.  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 He had a negative Lexiscan in 2013. 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 Patient has had no complaints of any cardiac type chest pain, palpitations, dyspnea / orthopnea / PND, dizziness, claudication, or dependent edema.       Hyperlipidemia is controlled with diet & meds. Patient denies myalgias or other med SE's. Last Lipids were not at goal:  Lab Results  Component Value Date   CHOL 209 (H) 09/04/2020   HDL 49 09/04/2020   LDLCALC 121 (H) 09/04/2020   TRIG 243 (H) 09/04/2020   CHOLHDL 4.3 09/04/2020    Also, the patient has history of PreDiabetes (A1c 5.9% /2011 & 6.4% /2016) and has had no symptoms of reactive hypoglycemia, diabetic polys, paresthesias or visual blurring.  Last A1c was not at goal:  Lab Results  Component Value Date   HGBA1C 6.0 (H) 06/06/2020                                                       Further, the patient also has history of Vitamin D Deficiency ("24" /2008) and supplements vitamin D without any suspected side-effects. Last vitamin D was still low:  Lab Results  Component Value Date   VD25OH 45 06/06/2020     Current Outpatient Medications on File Prior to Visit  Medication Sig   albuterol (PROVENTIL) (2.5 MG/3ML) 0.083% nebulizer solution Inhale 1 vial by Nebulizer 4 x /day or every 4 hr to rescue Asthma   albuterol (VENTOLIN HFA) 108 (90 Base) MCG/ACT inhaler Inhale 2 puffs into the lungs every 4  (four) hours as needed for wheezing or shortness of breath. Make sure you rinse mouth out after each use.   calcium carbonate (CALCIUM 600) 600 MG TABS tablet Take 1 tablet (600 mg total) by mouth 2 (two) times daily with a meal.   Ensure (ENSURE) Take 237 mLs by mouth. Drinks one with his meals----per patient-09/04/2020   ezetimibe (ZETIA) 10 MG tablet Take 1 tablet (10 mg total) by mouth daily.   lidocaine (XYLOCAINE) 2 % solution Use as directed 15 mLs in the mouth or throat as needed for mouth pain.   loratadine (CLARITIN) 10 MG tablet TAKE 1 TABLET BY MOUTH DAILY FOR ALLERGIES   mirtazapine (REMERON) 15 MG tablet Take one tablet by mouth at bedtime for weight.   nitroGLYCERIN (NITROSTAT) 0.4 MG SL tablet Place 1 tablet (0.4 mg total) under the tongue every 5 (five) minutes x 3 doses as needed for chest pain.   olmesartan-hydrochlorothiazide (BENICAR HCT) 20-12.5 MG tablet TAKE 1 TABLET DAILY FOR BLOOD PRESSURE   omeprazole (PRILOSEC) 40 MG capsule TAKE 1 CAPSULE DAILY TO  PREVENT HEARTBURN & INDIGESTION   oxybutynin (DITROPAN-XL) 10 MG 24 hr tablet TAKE 1 TABLET DAILY FOR BLADDER CONTROL   predniSONE (DELTASONE) 5 MG tablet TAKE 1 TABLET BY MOUTH TWICE A DAY AS DIRECTED FOR COPD   Respiratory Therapy Supplies (NEBULIZER/TUBING/MOUTHPIECE) KIT Disp one nebulizer machine, tubing set and mouthpiece kit for use 4 times daily   rosuvastatin (CRESTOR) 20 MG tablet TAKE 1 TABLET BY MOUTH EVERY DAY FOR CHOLESTEROL   tadalafil (CIALIS) 20 MG tablet Take 1/2 to 1 tablet every 2 to 3 days if needed for XXXX   tiZANidine (ZANAFLEX) 4 MG tablet TAKE 1 TABLET 3 X /DAY FOR BACK PAIN & MUSCLE SPASMS   VITAMIN D PO Take 5,000 Units by mouth daily.   zinc gluconate 50 MG tablet Take 50 mg by mouth daily. Takes some days. (Patient not taking: Reported on 12/05/2020)   No Known Allergies  PMHx:   Past Medical History:  Diagnosis Date   Aortic atherosclerosis ([place]) 03/04/2018   Noted on CT Abd/pelvis    Cataract    Bilateral   Colon polyp    COPD (chronic obstructive pulmonary disease) ([place])    Coronary artery disease    Diverticulosis 07/16/2016   SIGMOID, NOTED ON COLONOSCOPY   Esophageal stenosis 02/24/2018   noted on endoscopy   Esophageal stricture    GERD (gastroesophageal reflux disease)    Grade I diastolic dysfunction 72/53/6644   noted on ECHO   History of hiatal hernia 02/24/2018   Small noted on endoscopy   Hydronephrosis    Hydronephrosis of right kidney 03/04/2018   URETERAL STONE 7 MM, NOTED ONJ CT ABD/PELVIS   Hyperlipidemia    Myocardial infarction [facility])    [id number]   Nephrolithiasis    s/p stent placement   Skin cancer    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 NOTED ON CT ABD/PELVIS   Vitamin D deficiency    Wears dentures    upper    Immunization History  Administered Date(s) Administered   Influenza Split 04/08/2012   Influenza, High Dose  03/04/2017   Influenza,inj,Quad PF 05/16/2015, 02/15/2018   Moderna Sars-Covid-2 Vacc 07/15/2019, 08/12/2019   Pneumococcal -13 07/25/2014   Pneumococcal -23 04/08/2012   Pneumococcal-23 06/06/2002   Td 06/30/2011   Tdap 10/21/2019   Zoster, Live 05/22/2008     Past Surgical History:  Procedure Laterality Date   CARDIAC CATHETERIZATION  1983   No intervention performed.    CATARACT EXTRACTION, BILATERAL     COLONOSCOPY     COLONOSCOPY W/ POLYPECTOMY  08/12/2010   Dr. Silvano Rusk   COLONOSCOPY W/ POLYPECTOMY  07/16/2016   CYSTOSCOPY W/ URETERAL STENT PLACEMENT Right 03/04/2018   Procedure: CYSTOSCOPY WITH RETROGRADE PYELOGRAM/URETERAL STENT PLACEMENT;  Surgeon: [name], MD;  Location: [place];  Service: Urology;  Laterality: Right;   CYSTOSCOPY/URETEROSCOPY/HOLMIUM LASER/STENT PLACEMENT Right 03/16/2018   Procedure: CYSTOSCOPY RIGHT URETEROSCOPY/HOLMIUM LASER/STENT PLACEMENT;  Surgeon: [name], MD;  Location: [facility];  Service: Urology;  Laterality: Right;   ESOPHAGOGASTRODUODENOSCOPY     INGUINAL HERNIA REPAIR     left   right leg amputation  05/14/1977   SHOULDER ARTHROSCOPY Left 2000   UPPER GASTROINTESTINAL ENDOSCOPY  WITH DILATION  02/24/2018    FHx:    Reviewed / unchanged  SHx:    Reviewed / unchanged   Systems Review:  Constitutional: Denies fever, chills, wt changes, headaches,  insomnia, fatigue, night sweats, change in appetite. Eyes: Denies redness, blurred vision, diplopia, discharge, itchy, watery eyes.  ENT: Denies discharge, congestion, post nasal drip, epistaxis, sore throat, earache, hearing loss, dental pain, tinnitus, vertigo, sinus pain, snoring.  CV: Denies chest pain, palpitations, irregular heartbeat, syncope, dyspnea, diaphoresis, orthopnea, PND, claudication or edema. Respiratory: denies cough, dyspnea, DOE, pleurisy, hoarseness, laryngitis, wheezing.  Gastrointestinal: Denies dysphagia, odynophagia, heartburn, reflux, water brash, abdominal pain or cramps, nausea, vomiting, bloating, diarrhea, constipation, hematemesis, melena, hematochezia  or hemorrhoids. Genitourinary: Denies dysuria, frequency, urgency, nocturia, hesitancy, discharge, hematuria or flank pain. Musculoskeletal: Denies arthralgias, myalgias, stiffness, jt. swelling, pain, limping or strain/sprain.  Skin: Denies pruritus, rash, hives, warts, acne, eczema or change in skin lesion(s). Neuro: No weakness, tremor, incoordination, spasms, paresthesia or pain. Psychiatric: Denies confusion, memory loss or sensory loss. Endo: Denies change in weight, skin or hair change.  Heme/Lymph: No excessive bleeding, bruising or enlarged lymph nodes.  Physical Exam  BP 130/76   Pulse (!) 54   Temp 97.6 F (36.4 C)   Resp 16   Ht 5' 8"  (1.727 m)   Wt 133 lb (60.3 kg)   SpO2 99%   BMI 20.22 kg/m   Appears  well nourished, well groomed  and in no distress.  Eyes: PERRLA, EOMs, conjunctiva no swelling or  erythema. Sinuses: No frontal/maxillary tenderness ENT/Mouth: EAC's clear, TM's nl w/o erythema, bulging. Nares clear w/o erythema, swelling, exudates. Oropharynx clear without erythema or exudates. Oral hygiene is good. Tongue normal, non obstructing. Hearing intact.  Neck: Supple. Thyroid not palpable. Car 2+/2+ without bruits, nodes or JVD. Chest: Respirations nl with BS clear & equal w/o rales, rhonchi, wheezing or stridor.  Cor: Heart sounds normal w/ regular rate and rhythm without sig. murmurs, gallops, clicks or rubs. Peripheral pulses normal and equal  without edema.  Abdomen: Soft & bowel sounds normal. Non-tender w/o guarding, rebound, hernias, masses or organomegaly.  Lymphatics: Unremarkable.  Musculoskeletal: Full ROM all peripheral extremities, joint stability, 5/5 strength and normal gait.  Skin: Warm, dry without exposed rashes, lesions or ecchymosis apparent.  Neuro: Cranial nerves intact, reflexes equal bilaterally. Sensory-motor testing grossly intact. Tendon reflexes grossly intact.  Pysch: Alert & oriented x 3.  Insight and judgement nl & appropriate. No ideations.  Assessment and Plan:  1. Essential hypertension  - Continue medication, monitor blood pressure at home.  - Continue DASH diet.  Reminder to go to the ER if any CP,  SOB, nausea, dizziness, severe HA, changes vision/speech.   - CBC with Differential/Platelet - COMPLETE METABOLIC PANEL WITH GFR - Magnesium - TSH  2. Hyperlipidemia, mixed  - Continue diet/meds, exercise,& lifestyle modifications.  - Continue monitor periodic cholesterol/liver & renal functions    - Lipid panel  3. Abnormal glucose  - Continue diet, exercise  - Lifestyle modifications.  - Monitor appropriate labs   - Insulin, random - Hemoglobin A1c  4. 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 Hx of right BKA ([place])  6. Vitamin D deficiency  - Continue supplementation.    7.  Medication management  - CBC with Differential/Platelet - COMPLETE METABOLIC PANEL WITH GFR - Magnesium - Lipid panel - TSH - Insulin, random - VITAMIN D 25 Hydroxy  - Hemoglobin A1c        Discussed  regular exercise, BP monitoring, weight control to achieve/maintain BMI less than 25 and discussed med and SE's. Recommended labs to assess and monitor clinical status with further  disposition pending results of labs.  I discussed the assessment and treatment plan with the patient. The patient was provided an opportunity to ask questions and all were answered. The patient agreed with the plan and demonstrated an understanding of the instructions.  I provided over 30 minutes of exam, counseling, chart review and  complex critical decision making.        The patient was advised to call back or seek an in-person evaluation if the symptoms worsen or if the condition fails to improve as anticipated.   Kirtland Bouchard, MD

## 2020-12-05 ENCOUNTER — Encounter: Payer: Self-pay | Admitting: Internal Medicine

## 2020-12-05 ENCOUNTER — Other Ambulatory Visit: Payer: Self-pay

## 2020-12-05 ENCOUNTER — Ambulatory Visit (INDEPENDENT_AMBULATORY_CARE_PROVIDER_SITE_OTHER): Payer: Medicare HMO | Admitting: Internal Medicine

## 2020-12-05 VITALS — BP 130/76 | HR 54 | Temp 97.6°F | Resp 16 | Ht 68.0 in | Wt 133.0 lb

## 2020-12-05 DIAGNOSIS — Z89511 Acquired absence of right leg below knee: Secondary | ICD-10-CM

## 2020-12-05 DIAGNOSIS — E782 Mixed hyperlipidemia: Secondary | ICD-10-CM | POA: Diagnosis not present

## 2020-12-05 DIAGNOSIS — I700 Atherosclerosis of aorta: Secondary | ICD-10-CM

## 2020-12-05 DIAGNOSIS — Z79899 Other long term (current) drug therapy: Secondary | ICD-10-CM

## 2020-12-05 DIAGNOSIS — E559 Vitamin D deficiency, unspecified: Secondary | ICD-10-CM | POA: Diagnosis not present

## 2020-12-05 DIAGNOSIS — R7309 Other abnormal glucose: Secondary | ICD-10-CM | POA: Diagnosis not present

## 2020-12-05 DIAGNOSIS — I10 Essential (primary) hypertension: Secondary | ICD-10-CM

## 2020-12-06 LAB — CBC WITH DIFFERENTIAL/PLATELET
Absolute Monocytes: 679 cells/uL (ref 200–950)
Basophils Absolute: 113 cells/uL (ref 0–200)
Basophils Relative: 1.3 %
Eosinophils Absolute: 87 cells/uL (ref 15–500)
Eosinophils Relative: 1 %
HCT: 39.6 % (ref 38.5–50.0)
Hemoglobin: 13.2 g/dL (ref 13.2–17.1)
Lymphs Abs: 809 cells/uL — ABNORMAL LOW (ref 850–3900)
MCH: 32.5 pg (ref 27.0–33.0)
MCHC: 33.3 g/dL (ref 32.0–36.0)
MCV: 97.5 fL (ref 80.0–100.0)
MPV: 8.6 fL (ref 7.5–12.5)
Monocytes Relative: 7.8 %
Neutro Abs: 7012 cells/uL (ref 1500–7800)
Neutrophils Relative %: 80.6 %
Platelets: 518 10*3/uL — ABNORMAL HIGH (ref 140–400)
RBC: 4.06 10*6/uL — ABNORMAL LOW (ref 4.20–5.80)
RDW: 13.5 % (ref 11.0–15.0)
Total Lymphocyte: 9.3 %
WBC: 8.7 10*3/uL (ref 3.8–10.8)

## 2020-12-06 LAB — COMPLETE METABOLIC PANEL WITH GFR
AG Ratio: 1.7 (calc) (ref 1.0–2.5)
ALT: 15 U/L (ref 9–46)
AST: 17 U/L (ref 10–35)
Albumin: 3.8 g/dL (ref 3.6–5.1)
Alkaline phosphatase (APISO): 53 U/L (ref 35–144)
BUN: 12 mg/dL (ref 7–25)
CO2: 30 mmol/L (ref 20–32)
Calcium: 9.5 mg/dL (ref 8.6–10.3)
Chloride: 105 mmol/L (ref 98–110)
Creat: 1.11 mg/dL (ref 0.70–1.11)
GFR, Est African American: 72 mL/min/{1.73_m2} (ref >=60)
GFR, Est Non African American: 62 mL/min/{1.73_m2} (ref >=60)
Globulin: 2.3 g/dL (calc) (ref 1.9–3.7)
Glucose, Bld: 94 mg/dL (ref 65–99)
Potassium: 4.9 mmol/L (ref 3.5–5.3)
Sodium: 141 mmol/L (ref 135–146)
Total Bilirubin: 0.3 mg/dL (ref 0.2–1.2)
Total Protein: 6.1 g/dL (ref 6.1–8.1)

## 2020-12-06 LAB — MAGNESIUM: Magnesium: 1.8 mg/dL (ref 1.5–2.5)

## 2020-12-06 LAB — HEMOGLOBIN A1C
Hgb A1c MFr Bld: 5.9 % of total Hgb — ABNORMAL HIGH (ref <5.7)
Mean Plasma Glucose: 123 mg/dL
eAG (mmol/L): 6.8 mmol/L

## 2020-12-06 LAB — LIPID PANEL
Cholesterol: 213 mg/dL — ABNORMAL HIGH (ref <200)
HDL: 44 mg/dL (ref >=40)
LDL Cholesterol (Calc): 127 mg/dL (calc) — ABNORMAL HIGH
Non-HDL Cholesterol (Calc): 169 mg/dL (calc) — ABNORMAL HIGH (ref <130)
Total CHOL/HDL Ratio: 4.8 (calc) (ref <5.0)
Triglycerides: 296 mg/dL — ABNORMAL HIGH (ref <150)

## 2020-12-06 LAB — INSULIN, RANDOM: Insulin: 5 u[IU]/mL

## 2020-12-06 LAB — VITAMIN D 25 HYDROXY (VIT D DEFICIENCY, FRACTURES): Vit D, 25-Hydroxy: 44 ng/mL (ref 30–100)

## 2020-12-06 LAB — TSH: TSH: 1.3 mIU/L (ref 0.40–4.50)

## 2020-12-08 NOTE — Progress Notes (Signed)
============================================================ ============================================================  -   Magnesium  -   1.8   -  very  low- goal is betw 2.0 - 2.5,   - So..............Marland Kitchen  Recommend that you take  Magnesium 500 mg tablet x 2 tablets /Daily   - also important to eat lots of  leafy green vegetables   - spinach - Kale - collards - greens - okra - asparagus  - broccoli - quinoa - squash - almonds   - black, red, white beans  -  peas - green beans ============================================================ ============================================================  -   Total Chol = 213 - elevated            (  Ideal or Goal is Less than 180  !  )  - and   - Dangerous / Bad LDL Chol = 127 - Also very elevated            (  Ideal or Goal is less than 70  !  )   - Recommend stricter low chol diet   - Cholesterol only comes from animal sources  - ie. meat, dairy, egg yolks  - Eat all the vegetables you want.  - Avoid meat, especially red meat - Beef AND Pork .  - Avoid cheese & dairy - milk & ice cream.     - Cheese is the most concentrated form of trans-fats which  is the worst thing to clog up our arteries.   - Veggie cheese is OK which can be found in the fresh  produce section at [employer] or [employer] or [employer] ============================================================ ============================================================  -  Vitamin D = 44 - very low   - Vitamin D goal is between 70-100.   - Please INCREASE your Vitamin D 5,000 unit capsules to                                                                           2 capsules = 10,000 units /daily   - It is very important as a natural anti-inflammatory and helping the  immune system protect against viral infections, like the Covid-19    helping hair, skin, and nails, as well as reducing stroke and  heart attack risk.   - It helps your bones and helps  with mood.  - It also decreases numerous cancer risks so please  take it as directed.   - Low Vit D is associated with a 200-300% higher risk for  CANCER   and 200-300% higher risk for HEART   ATTACK  &  STROKE.    - It is also associated with higher death rate at younger ages,   autoimmune diseases like Rheumatoid arthritis, Lupus,  Multiple Sclerosis.     - Also many other serious conditions, like depression, Alzheimer's  Dementia, infertility, muscle aches, fatigue, fibromyalgia   - just to name a few. ============================================================ ============================================================  -  A1c = 5.9% - Blood sugar and A1c are STILL elevated in                  the borderline and early or pre-diabetes range which has the same   300% increased risk for heart attack, stroke, cancer and  Alzheimer- type  Vascular Dementia as full blown diabetes.   But the good news is that diet, exercise with                                           weight loss can cure the early diabetes at this point. ============================================================ ============================================================  -   All Else - CBC - Kidneys - Electrolytes - Liver - Magnesium & Thyroid    - all  Normal / OK ============================================================ ============================================================

## 2020-12-10 NOTE — Progress Notes (Signed)
Pt was called to discuss his lab results. Pt did not answer. A VM was left for him to call the office back. 12/10/20 DG/[facility]MA

## 2020-12-21 ENCOUNTER — Other Ambulatory Visit: Payer: Self-pay

## 2020-12-21 ENCOUNTER — Emergency Department (HOSPITAL_COMMUNITY): Payer: Medicare HMO

## 2020-12-21 ENCOUNTER — Observation Stay (HOSPITAL_COMMUNITY)
Admission: EM | Admit: 2020-12-21 | Discharge: 2020-12-22 | Disposition: A | Discharge status: Home / Self Care | Payer: Medicare HMO | Attending: Internal Medicine | Admitting: Internal Medicine

## 2020-12-21 ENCOUNTER — Observation Stay (HOSPITAL_COMMUNITY): Payer: Medicare HMO

## 2020-12-21 ENCOUNTER — Encounter (HOSPITAL_COMMUNITY): Payer: Self-pay

## 2020-12-21 DIAGNOSIS — I129 Hypertensive chronic kidney disease with stage 1 through stage 4 chronic kidney disease, or unspecified chronic kidney disease: Secondary | ICD-10-CM | POA: Insufficient documentation

## 2020-12-21 DIAGNOSIS — Z79899 Other long term (current) drug therapy: Secondary | ICD-10-CM | POA: Diagnosis not present

## 2020-12-21 DIAGNOSIS — R001 Bradycardia, unspecified: Secondary | ICD-10-CM | POA: Insufficient documentation

## 2020-12-21 DIAGNOSIS — Z87891 Personal history of nicotine dependence: Secondary | ICD-10-CM | POA: Diagnosis not present

## 2020-12-21 DIAGNOSIS — R519 Headache, unspecified: Secondary | ICD-10-CM | POA: Diagnosis present

## 2020-12-21 DIAGNOSIS — H538 Other visual disturbances: Secondary | ICD-10-CM | POA: Diagnosis not present

## 2020-12-21 DIAGNOSIS — R29818 Other symptoms and signs involving the nervous system: Secondary | ICD-10-CM | POA: Diagnosis not present

## 2020-12-21 DIAGNOSIS — R7303 Prediabetes: Secondary | ICD-10-CM | POA: Diagnosis not present

## 2020-12-21 DIAGNOSIS — I6603 Occlusion and stenosis of bilateral middle cerebral arteries: Secondary | ICD-10-CM | POA: Diagnosis not present

## 2020-12-21 DIAGNOSIS — I10 Essential (primary) hypertension: Secondary | ICD-10-CM

## 2020-12-21 DIAGNOSIS — N1832 Chronic kidney disease, stage 3b: Secondary | ICD-10-CM | POA: Insufficient documentation

## 2020-12-21 DIAGNOSIS — R1310 Dysphagia, unspecified: Secondary | ICD-10-CM | POA: Diagnosis not present

## 2020-12-21 DIAGNOSIS — I491 Atrial premature depolarization: Secondary | ICD-10-CM | POA: Diagnosis not present

## 2020-12-21 DIAGNOSIS — G459 Transient cerebral ischemic attack, unspecified: Principal | ICD-10-CM | POA: Insufficient documentation

## 2020-12-21 DIAGNOSIS — I6523 Occlusion and stenosis of bilateral carotid arteries: Secondary | ICD-10-CM | POA: Diagnosis not present

## 2020-12-21 DIAGNOSIS — Z20822 Contact with and (suspected) exposure to covid-19: Secondary | ICD-10-CM | POA: Diagnosis not present

## 2020-12-21 DIAGNOSIS — Z85828 Personal history of other malignant neoplasm of skin: Secondary | ICD-10-CM | POA: Diagnosis not present

## 2020-12-21 DIAGNOSIS — I2510 Atherosclerotic heart disease of native coronary artery without angina pectoris: Secondary | ICD-10-CM | POA: Diagnosis not present

## 2020-12-21 DIAGNOSIS — M4319 Spondylolisthesis, multiple sites in spine: Secondary | ICD-10-CM | POA: Diagnosis not present

## 2020-12-21 DIAGNOSIS — M5021 Other cervical disc displacement,  high cervical region: Secondary | ICD-10-CM | POA: Diagnosis not present

## 2020-12-21 DIAGNOSIS — J449 Chronic obstructive pulmonary disease, unspecified: Secondary | ICD-10-CM | POA: Insufficient documentation

## 2020-12-21 DIAGNOSIS — G4489 Other headache syndrome: Secondary | ICD-10-CM | POA: Diagnosis not present

## 2020-12-21 DIAGNOSIS — R9431 Abnormal electrocardiogram [ECG] [EKG]: Secondary | ICD-10-CM | POA: Diagnosis not present

## 2020-12-21 DIAGNOSIS — G319 Degenerative disease of nervous system, unspecified: Secondary | ICD-10-CM | POA: Diagnosis not present

## 2020-12-21 DIAGNOSIS — M50221 Other cervical disc displacement at C4-C5 level: Secondary | ICD-10-CM | POA: Diagnosis not present

## 2020-12-21 DIAGNOSIS — M4802 Spinal stenosis, cervical region: Secondary | ICD-10-CM | POA: Diagnosis not present

## 2020-12-21 LAB — I-STAT CHEM 8, ED
BUN: 14 mg/dL (ref 8–23)
Calcium, Ion: 1.19 mmol/L (ref 1.15–1.40)
Chloride: 106 mmol/L (ref 98–111)
Creatinine, Ser: 1.2 mg/dL (ref 0.61–1.24)
Glucose, Bld: 119 mg/dL — ABNORMAL HIGH (ref 70–99)
HCT: 41 % (ref 39.0–52.0)
Hemoglobin: 13.9 g/dL (ref 13.0–17.0)
Potassium: 4 mmol/L (ref 3.5–5.1)
Sodium: 139 mmol/L (ref 135–145)
TCO2: 25 mmol/L (ref 22–32)

## 2020-12-21 LAB — COMPREHENSIVE METABOLIC PANEL
ALT: 17 U/L (ref 0–44)
AST: 19 U/L (ref 15–41)
Albumin: 3.7 g/dL (ref 3.5–5.0)
Alkaline Phosphatase: 54 U/L (ref 38–126)
Anion gap: 8 (ref 5–15)
BUN: 13 mg/dL (ref 8–23)
CO2: 24 mmol/L (ref 22–32)
Calcium: 9.3 mg/dL (ref 8.9–10.3)
Chloride: 107 mmol/L (ref 98–111)
Creatinine, Ser: 1.15 mg/dL (ref 0.61–1.24)
GFR, Estimated: >60 mL/min (ref >60)
Glucose, Bld: 127 mg/dL — ABNORMAL HIGH (ref 70–99)
Potassium: 4.1 mmol/L (ref 3.5–5.1)
Sodium: 139 mmol/L (ref 135–145)
Total Bilirubin: 0.7 mg/dL (ref 0.3–1.2)
Total Protein: 6.6 g/dL (ref 6.5–8.1)

## 2020-12-21 LAB — CBC
HCT: 40.2 % (ref 39.0–52.0)
Hemoglobin: 13.3 g/dL (ref 13.0–17.0)
MCH: 32.9 pg (ref 26.0–34.0)
MCHC: 33.1 g/dL (ref 30.0–36.0)
MCV: 99.5 fL (ref 80.0–100.0)
Platelets: 266 10*3/uL (ref 150–400)
RBC: 4.04 MIL/uL — ABNORMAL LOW (ref 4.22–5.81)
RDW: 13.7 % (ref 11.5–15.5)
WBC: 11.2 10*3/uL — ABNORMAL HIGH (ref 4.0–10.5)
nRBC: 0 % (ref 0.0–0.2)

## 2020-12-21 LAB — DIFFERENTIAL
Abs Immature Granulocytes: 0.08 10*3/uL — ABNORMAL HIGH (ref 0.00–0.07)
Basophils Absolute: 0.1 10*3/uL (ref 0.0–0.1)
Basophils Relative: 0 %
Eosinophils Absolute: 0.2 10*3/uL (ref 0.0–0.5)
Eosinophils Relative: 1 %
Immature Granulocytes: 1 %
Lymphocytes Relative: 6 %
Lymphs Abs: 0.6 10*3/uL — ABNORMAL LOW (ref 0.7–4.0)
Monocytes Absolute: 0.7 10*3/uL (ref 0.1–1.0)
Monocytes Relative: 6 %
Neutro Abs: 9.6 10*3/uL — ABNORMAL HIGH (ref 1.7–7.7)
Neutrophils Relative %: 86 %

## 2020-12-21 LAB — CBG MONITORING, ED: Glucose-Capillary: 125 mg/dL — ABNORMAL HIGH (ref 70–99)

## 2020-12-21 LAB — URINALYSIS, ROUTINE W REFLEX MICROSCOPIC
Bilirubin Urine: NEGATIVE
Glucose, UA: NEGATIVE mg/dL
Ketones, ur: NEGATIVE mg/dL
Nitrite: NEGATIVE
Protein, ur: NEGATIVE mg/dL
Specific Gravity, Urine: >1.046 — ABNORMAL HIGH (ref 1.005–1.030)
WBC, UA: >50 WBC/hpf — ABNORMAL HIGH (ref 0–5)
pH: 5 (ref 5.0–8.0)

## 2020-12-21 LAB — TROPONIN I (HIGH SENSITIVITY)
Troponin I (High Sensitivity): 8 ng/L (ref <18)
Troponin I (High Sensitivity): 5 ng/L (ref <18)

## 2020-12-21 LAB — RESP PANEL BY RT-PCR (FLU A&B, COVID) ARPGX2
Influenza A by PCR: NEGATIVE
Influenza B by PCR: NEGATIVE
SARS Coronavirus 2 by RT PCR: NEGATIVE

## 2020-12-21 LAB — PROTIME-INR
INR: 1 (ref 0.8–1.2)
Prothrombin Time: 13.2 seconds (ref 11.4–15.2)

## 2020-12-21 LAB — APTT: aPTT: 31 seconds (ref 24–36)

## 2020-12-21 MED ORDER — ACETAMINOPHEN 160 MG/5ML PO SOLN: 650.0000 mg | ORAL | Status: DC | PRN

## 2020-12-21 MED ORDER — ACETAMINOPHEN 325 MG PO TABS: 650.0000 mg | ORAL_TABLET | ORAL | Status: DC | PRN

## 2020-12-21 MED ORDER — ACETAMINOPHEN 650 MG RE SUPP: 650.0000 mg | RECTAL | Status: DC | PRN

## 2020-12-21 MED ORDER — SODIUM CHLORIDE 0.9% FLUSH
3.0000 mL | Freq: Once | INTRAVENOUS | Status: AC
  Administered 2020-12-21: 3 mL via INTRAVENOUS

## 2020-12-21 MED ORDER — ENOXAPARIN SODIUM 40 MG/0.4ML IJ SOSY: 40.0000 mg | PREFILLED_SYRINGE | INTRAMUSCULAR | Status: DC

## 2020-12-21 MED ORDER — ASPIRIN 81 MG PO CHEW
324.0000 mg | CHEWABLE_TABLET | Freq: Once | ORAL | Status: AC
  Administered 2020-12-21: 324 mg via ORAL
  Filled 2020-12-21: qty 4

## 2020-12-21 MED ORDER — ASPIRIN EC 81 MG PO TBEC
81.0000 mg | DELAYED_RELEASE_TABLET | Freq: Every day | ORAL | Status: DC
  Administered 2020-12-22: 81 mg via ORAL
  Filled 2020-12-21: qty 1

## 2020-12-21 MED ORDER — STROKE: EARLY STAGES OF RECOVERY BOOK
Freq: Once | Status: AC
  Filled 2020-12-21: qty 1

## 2020-12-21 MED ORDER — SENNOSIDES-DOCUSATE SODIUM 8.6-50 MG PO TABS: 1.0000 | ORAL_TABLET | Freq: Every evening | ORAL | Status: DC | PRN

## 2020-12-21 MED ORDER — IOHEXOL 350 MG/ML SOLN
100.0000 mL | Freq: Once | INTRAVENOUS | Status: AC | PRN
  Administered 2020-12-21: 100 mL via INTRAVENOUS

## 2020-12-21 NOTE — ED Provider Notes (Signed)
[facility] EMERGENCY DEPARTMENT Provider Note   CSN: [id number] Arrival date & time: 12/21/20  1537  An emergency department physician performed an initial assessment on this suspected stroke patient at 1538.  History Chief Complaint  Patient presents with   Code Stroke     RONELL DUFFUS is a 80 y.o. male.  HPI  Patient is an 80 year old male with a history of hyperlipidemia, COPD, sinus bradycardia, colonic polyps, hypertension, prediabetes, vitamin D deficiency, history of right BKA, atherosclerotic heart disease, MI, GERD, nephrolithiasis, CKD, who presents to the emergency department today for evaluation as a code stroke. Pt denies chest pain, pleuritic pain, sob.  Per EMS patient's last known well was at 9 AM.  He developed a headache, blurred vision and had some gait disturbance.  He also had some left arm tingling and numbness.  He had some nausea and vomiting as well.  Denies any recent falls, trauma.  Past Medical History:  Diagnosis Date   Aortic atherosclerosis ([place]) 03/04/2018   Noted on CT Abd/pelvis   Cataract    Bilateral   Colon polyp    COPD (chronic obstructive pulmonary disease) ([place])    Coronary artery disease    Diverticulosis 07/16/2016   SIGMOID, NOTED ON COLONOSCOPY   Esophageal stenosis 02/24/2018   noted on endoscopy   Esophageal stricture    GERD (gastroesophageal reflux disease)    Grade I diastolic dysfunction 10/62/6948   noted on ECHO   History of hiatal hernia 02/24/2018   Small noted on endoscopy   Hydronephrosis    Hydronephrosis of right kidney 03/04/2018   URETERAL STONE 7 MM, NOTED ONJ CT ABD/PELVIS   Hyperlipidemia    Myocardial infarction [facility])    [id number]   Nephrolithiasis    s/p stent placement   Skin cancer    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   S/P unilateral BKA (below knee amputation), right ([place]) 09/22/2017   GERD 02/15/2015   Medicare annual wellness visit, initial 02/15/2015   ASHD hx/o MI 07/25/2014   Hx of right BKA ([place]) 01/15/2014   Essential hypertension 07/05/2013   Prediabetes 07/05/2013   Vitamin D deficiency 07/05/2013   Medication management 07/05/2013   History of colonic polyps 03/30/2012   Hyperlipidemia, mixed 11/13/2011   COPD (chronic obstructive pulmonary disease) ([place]) 11/13/2011   Sinus bradycardia 11/13/2011    Past Surgical History:  Procedure Laterality Date   CARDIAC CATHETERIZATION  1983   No intervention performed.    CATARACT EXTRACTION, BILATERAL     COLONOSCOPY     COLONOSCOPY W/ POLYPECTOMY  08/12/2010   Dr. Silvano Rusk   COLONOSCOPY W/ POLYPECTOMY  07/16/2016   CYSTOSCOPY W/ URETERAL STENT PLACEMENT Right 03/04/2018   Procedure: CYSTOSCOPY WITH RETROGRADE PYELOGRAM/URETERAL STENT PLACEMENT;  Surgeon: [name], MD;  Location: [facility];  Service: Urology;  Laterality: Right;   CYSTOSCOPY/URETEROSCOPY/HOLMIUM LASER/STENT PLACEMENT Right 03/16/2018   Procedure:  CYSTOSCOPY RIGHT URETEROSCOPY/HOLMIUM LASER/STENT PLACEMENT;  Surgeon: [name], MD;  Location: [facility];  Service: Urology;  Laterality: Right;   ESOPHAGOGASTRODUODENOSCOPY     INGUINAL HERNIA REPAIR     left   right leg amputation  05/14/1977   SHOULDER ARTHROSCOPY Left 2000   UPPER GASTROINTESTINAL ENDOSCOPY  WITH DILATION  02/24/2018       Family History  Problem Relation Age of Onset   Hypertension Father     Pancreatic cancer Mother    Cancer Mother    Colon cancer Brother    Leukemia Brother    Prostate cancer Brother    Other Brother        polio   Esophageal cancer Neg Hx    Rectal cancer Neg Hx    Stomach cancer Neg Hx     Social History   Tobacco Use   Smoking status: Former    Packs/day: 3.00    Years: 20.00    Pack years: 60.00    Types: Cigarettes    Quit date: 04/11/2000    Years since quitting: 20.7   Smokeless tobacco: Never  Substance Use Topics   Alcohol use: Not Currently    Comment: rare   Drug use: No    Home Medications Prior to Admission medications   Medication Sig Start Date End Date Taking? Authorizing Provider  albuterol (PROVENTIL) (2.5 MG/3ML) 0.083% nebulizer solution Inhale 1 vial by Nebulizer 4 x /day or every 4 hr to rescue Asthma 04/07/19   Unk Pinto, MD  albuterol (VENTOLIN HFA) 108 (90 Base) MCG/ACT inhaler Inhale 2 puffs into the lungs every 4 (four) hours as needed for wheezing or shortness of breath. Make sure you rinse mouth out after each use. 08/29/19   Garnet Sierras, NP  calcium carbonate (CALCIUM 600) 600 MG TABS tablet Take 1 tablet (600 mg total) by mouth 2 (two) times daily with a meal. 07/29/18   McClanahan, Kyra, NP  Ensure (ENSURE) Take 237 mLs by mouth. Drinks one with his meals----per patient-09/04/2020    [provider]  ezetimibe (ZETIA) 10 MG tablet Take 1 tablet (10 mg total) by mouth daily. 09/05/20 09/05/21  Garnet Sierras, NP  lidocaine (XYLOCAINE) 2 % solution Use as directed 15 mLs in the mouth or throat as needed for mouth pain. 09/07/19   Vladimir Crofts, PA-C  loratadine (CLARITIN) 10 MG tablet TAKE 1 TABLET BY MOUTH DAILY FOR ALLERGIES 09/04/20   Garnet Sierras, NP  mirtazapine (REMERON) 15 MG tablet Take one tablet by mouth at bedtime for weight. 09/04/20   Garnet Sierras, NP  nitroGLYCERIN (NITROSTAT) 0.4 MG SL tablet Place 1 tablet (0.4 mg total) under the tongue every 5 (five) minutes x 3 doses as  needed for chest pain. 11/14/11   Dunn, Nedra Hai, PA-C  olmesartan-hydrochlorothiazide (BENICAR HCT) 20-12.5 MG tablet TAKE 1 TABLET DAILY FOR BLOOD PRESSURE 11/26/19   Unk Pinto, MD  omeprazole (PRILOSEC) 40 MG capsule TAKE 1 CAPSULE DAILY TO PREVENT HEARTBURN & INDIGESTION 06/26/20   Liane Comber, NP  oxybutynin (DITROPAN-XL) 10 MG 24 hr tablet TAKE 1 TABLET DAILY FOR BLADDER CONTROL 10/12/20   Liane Comber, NP  predniSONE (DELTASONE) 5 MG tablet TAKE 1 TABLET BY MOUTH TWICE A DAY AS DIRECTED FOR COPD 11/27/20   Unk Pinto, MD  Respiratory Therapy Supplies (NEBULIZER/TUBING/MOUTHPIECE) KIT Disp one nebulizer machine, tubing set and mouthpiece kit for use 4 times daily 07/18/20   Unk Pinto, MD  rosuvastatin (CRESTOR) 20 MG  tablet TAKE 1 TABLET BY MOUTH EVERY DAY FOR CHOLESTEROL 04/17/20   Liane Comber, NP  tadalafil (CIALIS) 20 MG tablet Take 1/2 to 1 tablet every 2 to 3 days if needed for XXXX 07/10/20   Unk Pinto, MD  tiZANidine (ZANAFLEX) 4 MG tablet TAKE 1 TABLET 3 X /DAY FOR BACK PAIN & MUSCLE SPASMS 08/08/20   Liane Comber, NP  VITAMIN D PO Take 5,000 Units by mouth daily.    [provider]  zinc gluconate 50 MG tablet Take 50 mg by mouth daily. Takes some days. Patient not taking: Reported on 12/05/2020    [provider]    Allergies    Patient has no known allergies.  Review of Systems   Review of Systems  Constitutional:  Negative for fever.  HENT:  Negative for ear pain and sore throat.   Eyes:  Positive for visual disturbance.  Respiratory:  Negative for cough and shortness of breath.   Cardiovascular:  Negative for chest pain.  Gastrointestinal:  Positive for nausea and vomiting. Negative for abdominal pain.  Genitourinary:  Negative for dysuria and hematuria.  Musculoskeletal:  Negative for back pain.  Skin:  Negative for rash.  Neurological:  Positive for weakness, numbness and headaches.  All other systems reviewed and are  negative.  Physical Exam Updated Vital Signs BP (!) 166/77   Pulse (!) 48   Temp (!) 97.4 F (36.3 C) (Axillary)   Resp 15   Wt 60.8 kg   SpO2 100%   BMI 20.38 kg/m   Physical Exam Vitals and nursing note reviewed.  Constitutional:      Appearance: He is well-developed.  HENT:     Head: Normocephalic and atraumatic.  Eyes:     Conjunctiva/sclera: Conjunctivae normal.  Cardiovascular:     Rate and Rhythm: Regular rhythm. Bradycardia present.     Heart sounds: Normal heart sounds. No murmur heard. Pulmonary:     Effort: Pulmonary effort is normal. No respiratory distress.     Breath sounds: Normal breath sounds. No wheezing, rhonchi or rales.  Abdominal:     General: Bowel sounds are normal.     Palpations: Abdomen is soft.     Tenderness: There is no abdominal tenderness. There is no guarding or rebound.  Musculoskeletal:     Cervical back: Neck supple.  Skin:    General: Skin is warm and dry.  Neurological:     Mental Status: He is alert.     Comments: Mental Status: parts of exam were observed as neurology completed exam Alert, thought content appropriate, able to give a coherent history. Speech fluent without evidence of aphasia. Able to follow 2 step commands without difficulty.  Cranial Nerves:  II:  pupils equal, round, reactive to light III,IV, VI: ptosis not present, extra-ocular motions intact bilaterally  V,VII: slight facial droop on the left facial light touch sensation equal VIII: hearing grossly normal to voice  X: uvula elevates symmetrically  XI: bilateral shoulder shrug symmetric and strong XII: midline tongue extension without fassiculations Motor:  Normal tone. Slightly decreased strength to the lue Sensory: decreased sensation to the lue Cerebellar: normal finger-to-nose with bilateral upper extremities Gait: normal gait and balance.  + drift of the lle     ED Results / Procedures / Treatments   Labs (all labs ordered are listed, but only  abnormal results are displayed) Labs Reviewed  CBC - Abnormal; Notable for the following components:      Result Value  WBC 11.2 (*)    RBC 4.04 (*)    All other components within normal limits  DIFFERENTIAL - Abnormal; Notable for the following components:   Neutro Abs 9.6 (*)    Lymphs Abs 0.6 (*)    Abs Immature Granulocytes 0.08 (*)    All other components within normal limits  COMPREHENSIVE METABOLIC PANEL - Abnormal; Notable for the following components:   Glucose, Bld 127 (*)    All other components within normal limits  CBG MONITORING, ED - Abnormal; Notable for the following components:   Glucose-Capillary 125 (*)    All other components within normal limits  I-STAT CHEM 8, ED - Abnormal; Notable for the following components:   Glucose, Bld 119 (*)    All other components within normal limits  RESP PANEL BY RT-PCR (FLU A&B, COVID) ARPGX2  PROTIME-INR  APTT  URINALYSIS, ROUTINE W REFLEX MICROSCOPIC  CBG MONITORING, ED  TROPONIN I (HIGH SENSITIVITY)    EKG EKG Interpretation  Date/Time:  Saturday December 21 2020 18:17:45 EDT Ventricular Rate:  48 PR Interval:  136 QRS Duration: 101 QT Interval:  475 QTC Calculation: 425 R Axis:   85 Text Interpretation: Sinus bradycardia Borderline right axis deviation Nonspecific T abnrm, anterolateral leads Since last tracing Rate slower Confirmed by Calvert Cantor [phone]) on 12/21/2020 6:22:42 PM  Radiology MR BRAIN WO CONTRAST  Result Date: 12/21/2020 CLINICAL DATA:  Acute neuro deficit.  Rule out stroke EXAM: MRI HEAD WITHOUT CONTRAST TECHNIQUE: Multiplanar, multiecho pulse sequences of the brain and surrounding structures were obtained without intravenous contrast. COMPARISON:  CT angio head and neck 12/21/2020 FINDINGS: Brain: Negative for acute infarct. Moderate white matter changes consistent with chronic microvascular ischemia. Small area of encephalomalacia left inferior frontal lobe compatible with chronic infarct.  Negative for mass lesion. Chronic microhemorrhage right parietal periventricular white matter. Mild cerebral atrophy. Vascular: Normal arterial flow voids. Skull and upper cervical spine: Negative Sinuses/Orbits: Paranasal sinuses clear. Bilateral cataract extraction Other: None IMPRESSION: Negative for acute infarct. Atrophy and chronic microvascular ischemic changes in the white matter. Electronically Signed   By: Franchot Gallo M.D.   On: 12/21/2020 17:53   CT HEAD CODE STROKE WO CONTRAST  Result Date: 12/21/2020 CLINICAL DATA:  Code stroke. Acute neuro deficit. Headache and blurred vision EXAM: CT HEAD WITHOUT CONTRAST TECHNIQUE: Contiguous axial images were obtained from the base of the skull through the vertex without intravenous contrast. COMPARISON:  CT head 04/22/2016 FINDINGS: Brain: Mild atrophy has progressed. Moderate white matter changes bilaterally have progressed. Negative for acute infarct, hemorrhage, mass. Vascular: Negative for hyperdense vessel Skull: Negative Sinuses/Orbits: Paranasal sinuses clear. Bilateral cataract extraction Other: None ASPECTS ([place] Stroke Program Early CT Score) - Ganglionic level infarction (caudate, lentiform nuclei, internal capsule, insula, M1-M3 cortex): 7 - Supraganglionic infarction (M4-M6 cortex): 3 Total score (0-10 with 10 being normal): 10 IMPRESSION: 1. No acute abnormality 2. ASPECTS is 10 3. Progression of atrophy and chronic microvascular ischemic change since 2017 4. Code stroke imaging results were communicated on 12/21/2020 at 3:56 pm to provider Bhagat via text page Electronically Signed   By: Franchot Gallo M.D.   On: 12/21/2020 15:57   CT ANGIO HEAD NECK W WO CM (CODE STROKE)  Result Date: 12/21/2020 CLINICAL DATA:  Acute neuro deficit.  Headache and blurred vision EXAM: CT ANGIOGRAPHY HEAD AND NECK TECHNIQUE: Multidetector CT imaging of the head and neck was performed using the standard protocol during bolus administration of intravenous  contrast. Multiplanar CT image reconstructions and  MIPs were obtained to evaluate the vascular anatomy. Carotid stenosis measurements (when applicable) are obtained utilizing NASCET criteria, using the distal internal carotid diameter as the denominator. CONTRAST:  181m OMNIPAQUE IOHEXOL 350 MG/ML SOLN COMPARISON:  CT head 12/21/2020 FINDINGS: CTA NECK FINDINGS Aortic arch: Atherosclerotic disease in the aortic arch. Atherosclerotic disease and mild stenosis in the proximal left subclavian artery. Right carotid system: Mild atherosclerotic disease right carotid bifurcation without significant stenosis. Left carotid system: Mild atherosclerotic disease left carotid bifurcation without significant stenosis. Vertebral arteries: Both vertebral arteries patent to the skull base without significant stenosis. Skeleton: Multilevel cervical spondylosis. No acute skeletal abnormality. Other neck: Negative for mass or adenopathy in the neck. Upper chest: Apical emphysema and subpleural blebs. No acute abnormality. Review of the MIP images confirms the above findings CTA HEAD FINDINGS Anterior circulation: Internal carotid artery widely patent through the cavernous and supraclinoid segment. Hypoplastic right A1 segment. Both anterior cerebral arteries patent supplied primarily from the left. Mild stenosis in the middle cerebral artery at the M1/ M2 junction bilaterally. No branch occlusion. Posterior circulation: Both vertebral arteries patent to the basilar. Right PICA patent. Left PICA not visualized. Basilar patent. AICA patent bilaterally. Superior cerebellar and posterior cerebral arteries patent bilaterally without stenosis or large vessel occlusion. Venous sinuses: Normal venous enhancement Anatomic variants: None Review of the MIP images confirms the above findings IMPRESSION: 1. Negative for intracranial large vessel occlusion 2. Mild stenosis of the M1/ M2 segment bilaterally. Posterior circulation widely patent. 3.  Mild atherosclerotic disease in the carotid bifurcation. No significant carotid or vertebral artery stenosis in the neck. Electronically Signed   By: CFranchot GalloM.D.   On: 12/21/2020 16:24    Procedures Procedures     Medications Ordered in ED Medications   stroke: mapping our early stages of recovery book (has no administration in time range)  acetaminophen (TYLENOL) tablet 650 mg (has no administration in time range)    Or  acetaminophen (TYLENOL) 160 MG/5ML solution 650 mg (has no administration in time range)    Or  acetaminophen (TYLENOL) suppository 650 mg (has no administration in time range)  senna-docusate (Senokot-S) tablet 1 tablet (has no administration in time range)  enoxaparin (LOVENOX) injection 40 mg (has no administration in time range)  sodium chloride flush (NS) 0.9 % injection 3 mL (3 mLs Intravenous Given 12/21/20 1646)  iohexol (OMNIPAQUE) 350 MG/ML injection 100 mL (100 mLs Intravenous Contrast Given 12/21/20 1554)  aspirin chewable tablet 324 mg (324 mg Oral Given 12/21/20 1816)    ED Course  I have reviewed the triage vital signs and the nursing notes.  Pertinent labs & imaging results that were available during my care of the patient were reviewed by me and considered in my medical decision making (see chart for details).    MDM Rules/Calculators/A&P                          80y/o M presenting for eval as a code stroke. Lnw 9am. Had headache, blurred vision, ataxia, lue weakness/numbness.  Reviewed/interpreted imaging Chem 8 reassuring CBC with mild leukocytosis, no anemia CMP grossly reassuring Coags wnl UA pending on admission COVID pending on admission Trop pending on admission  EKG - Sinus bradycardia Borderline right axis deviation Nonspecific T abnrm, anterolateral leads Since last tracing Rate slower   Imaging reviewed/interpreted CT head - 1. No acute abnormality 2. ASPECTS is 10 3. Progression of atrophy and chronic microvascular  ischemic  change since 2017  CTA head/neck - 1. . Negative for intracranial large vessel occlusion 2. Mild stenosis of the M1/ M2 segment bilaterally. Posterior circulation widely patent. 3. Mild atherosclerotic disease in the carotid bifurcation. No significant carotid or vertebral artery stenosis in the neck.  Neuro evaluated pt at bedside and are recommending MRI brain and admission for full stroke w/u.   6:36 PM CONSULT with Dr. Flossie Buffy who accepts patient for admission   Final Clinical Impression(s) / ED Diagnoses Final diagnoses:  TIA (transient ischemic attack)    Rx / DC Orders ED Discharge Orders     None        Bishop Dublin 12/21/20 1845    Truddie Hidden, MD 12/21/20 2248

## 2020-12-21 NOTE — ED Notes (Signed)
Patient transported to MRI 

## 2020-12-21 NOTE — Plan of Care (Signed)

## 2020-12-21 NOTE — ED Notes (Signed)
Pt still in MRI 

## 2020-12-21 NOTE — Consult Note (Addendum)
Neurology Consultation  Reason for Consult: Left hand numbness, left temporal headache, transient blurred vision Referring Physician: Dr. Karle Starch  CC: Left temporal headache, left hand numbness  History is obtained from: EMS, Patient, Chart review  HPI: Leslie Cardenas is a 80 y.o. male with a medical history significant for aortic atherosclerosis, CAD, grade I diastolic dysfunction, MI in 1983, hyperlipidemia, essential hypertension, pre-diabetes, chronic kidney disease stage 3b, traumatic amputation of the right leg, and COPD who presented to the ED for evaluation of headache, blurred vision, and left hand numbness. Patient woke up at 05:00 this morning at his baseline. Around 09:00 he noted a bilateral temporal headache and blurry vision. At 15:00 today, [name] had nausea and vomiting as well as left hand numbness and gait disturbance with imbalance. He activated EMS and was transported to the ED for further neurology evaluation. On arrival, he complained about persistent bitemporal headache with a 6/10 severity, imbalance with gait, and persistent left hand numbness.   At baseline [name] is independent and able to complete his ADLs without assistance.   LKW: 09:00 with acute onset of blurry vision and headache tpa given?: no, patient outside of thrombolytic therapy window at the time of presentation IR Thrombectomy? No, presentation not consistent with LVO, initial vessel imaging without evidence of LVO.  Modified Rankin Scale: 0-Completely asymptomatic and back to baseline post- stroke  ROS: A complete ROS was performed and is negative except as noted in the HPI.  Past Medical History:  Diagnosis Date   Aortic atherosclerosis ([place]) 03/04/2018   Noted on CT Abd/pelvis   Cataract    Bilateral   Colon polyp    COPD (chronic obstructive pulmonary disease) ([place])    Coronary artery disease    Diverticulosis 07/16/2016   SIGMOID, NOTED ON COLONOSCOPY   Esophageal stenosis  02/24/2018   noted on endoscopy   Esophageal stricture    GERD (gastroesophageal reflux disease)    Grade I diastolic dysfunction 62/22/9798   noted on ECHO   History of hiatal hernia 02/24/2018   Small noted on endoscopy   Hydronephrosis    Hydronephrosis of right kidney 03/04/2018   URETERAL STONE 7 MM, NOTED ONJ CT ABD/PELVIS   Hyperlipidemia    Myocardial infarction [facility])    [id number]   Nephrolithiasis    s/p stent placement   Skin cancer    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)    Ureteral stone 03/04/2018   URETERAL STONE 7 MM. NOTED ON CT ABD/PELVIS   Vitamin D deficiency    Wears dentures    upper   Past Surgical History:  Procedure Laterality Date   CARDIAC CATHETERIZATION  1983   No intervention performed.    CATARACT EXTRACTION, BILATERAL     COLONOSCOPY     COLONOSCOPY W/ POLYPECTOMY  08/12/2010   Dr. Silvano Rusk   COLONOSCOPY W/ POLYPECTOMY  07/16/2016   CYSTOSCOPY W/ URETERAL STENT PLACEMENT Right 03/04/2018   Procedure: CYSTOSCOPY WITH RETROGRADE PYELOGRAM/URETERAL STENT PLACEMENT;  Surgeon: [name], MD;  Location: [facility];  Service: Urology;  Laterality: Right;   CYSTOSCOPY/URETEROSCOPY/HOLMIUM LASER/STENT PLACEMENT Right 03/16/2018   Procedure: CYSTOSCOPY RIGHT URETEROSCOPY/HOLMIUM LASER/STENT PLACEMENT;  Surgeon: [name], MD;  Location: [facility];  Service: Urology;  Laterality: Right;   ESOPHAGOGASTRODUODENOSCOPY     INGUINAL HERNIA REPAIR     left   right leg amputation  05/14/1977   SHOULDER ARTHROSCOPY Left 2000   UPPER GASTROINTESTINAL ENDOSCOPY  WITH DILATION  02/24/2018   Family History  Problem Relation Age of Onset   Hypertension Father    Pancreatic cancer Mother    Cancer Mother    Colon cancer Brother    Leukemia Brother    Prostate cancer Brother    Other Brother        polio   Esophageal cancer Neg Hx    Rectal cancer Neg Hx    Stomach cancer Neg Hx    Social History:   reports that he quit  smoking about 20 years ago. His smoking use included cigarettes. He has a 60.00 pack-year smoking history. He has never used smokeless tobacco. He reports previous alcohol use. He reports that he does not use drugs.  Medications  Current Facility-Administered Medications:    sodium chloride flush (NS) 0.9 % injection 3 mL, 3 mL, Intravenous, Once, Truddie Hidden, MD  Current Outpatient Medications:    albuterol (PROVENTIL) (2.5 MG/3ML) 0.083% nebulizer solution, Inhale 1 vial by Nebulizer 4 x /day or every 4 hr to rescue Asthma, Disp: 1350 mL, Rfl: 3   albuterol (VENTOLIN HFA) 108 (90 Base) MCG/ACT inhaler, Inhale 2 puffs into the lungs every 4 (four) hours as needed for wheezing or shortness of breath. Make sure you rinse mouth out after each use., Disp: 18 g, Rfl: 2   calcium carbonate (CALCIUM 600) 600 MG TABS tablet, Take 1 tablet (600 mg total) by mouth 2 (two) times daily with a meal., Disp: 90 tablet, Rfl: 3   Ensure (ENSURE), Take 237 mLs by mouth. Drinks one with his meals----per patient-09/04/2020, Disp: , Rfl:    ezetimibe (ZETIA) 10 MG tablet, Take 1 tablet (10 mg total) by mouth daily., Disp: 90 tablet, Rfl: 1   lidocaine (XYLOCAINE) 2 % solution, Use as directed 15 mLs in the mouth or throat as needed for mouth pain., Disp: 100 mL, Rfl: 0   loratadine (CLARITIN) 10 MG tablet, TAKE 1 TABLET BY MOUTH DAILY FOR ALLERGIES, Disp: 90 tablet, Rfl: 2   mirtazapine (REMERON) 15 MG tablet, Take one tablet by mouth at bedtime for weight., Disp: 90 tablet, Rfl: 2   nitroGLYCERIN (NITROSTAT) 0.4 MG SL tablet, Place 1 tablet (0.4 mg total) under the tongue every 5 (five) minutes x 3 doses as needed for chest pain., Disp: 25 tablet, Rfl: 4   olmesartan-hydrochlorothiazide (BENICAR HCT) 20-12.5 MG tablet, TAKE 1 TABLET DAILY FOR BLOOD PRESSURE, Disp: 90 tablet, Rfl: 3   omeprazole (PRILOSEC) 40 MG capsule, TAKE 1 CAPSULE DAILY TO PREVENT HEARTBURN & INDIGESTION, Disp: 90 capsule, Rfl: 1    oxybutynin (DITROPAN-XL) 10 MG 24 hr tablet, TAKE 1 TABLET DAILY FOR BLADDER CONTROL, Disp: 90 tablet, Rfl: 0   predniSONE (DELTASONE) 5 MG tablet, TAKE 1 TABLET BY MOUTH TWICE A DAY AS DIRECTED FOR COPD, Disp: 180 tablet, Rfl: 1   Respiratory Therapy Supplies (NEBULIZER/TUBING/MOUTHPIECE) KIT, Disp one nebulizer machine, tubing set and mouthpiece kit for use 4 times daily, Disp: 1 kit, Rfl: 0   rosuvastatin (CRESTOR) 20 MG tablet, TAKE 1 TABLET BY MOUTH EVERY DAY FOR CHOLESTEROL, Disp: 90 tablet, Rfl: 1   tadalafil (CIALIS) 20 MG tablet, Take 1/2 to 1 tablet every 2 to 3 days if needed for XXXX, Disp: 30 tablet, Rfl: 2   tiZANidine (ZANAFLEX) 4 MG tablet, TAKE 1 TABLET 3 X /DAY FOR BACK PAIN & MUSCLE SPASMS, Disp: 90 tablet, Rfl: 2   VITAMIN D PO, Take 5,000 Units by mouth daily., Disp: , Rfl:    zinc  gluconate 50 MG tablet, Take 50 mg by mouth daily. Takes some days. (Patient not taking: Reported on 12/05/2020), Disp: , Rfl:   Exam: Current vital signs: Wt 60.8 kg   BMI 20.38 kg/m  Vital signs in last 24 hours:   GENERAL: Awake, alert, in no acute distress Psych: Affect appropriate for situation, calm and cooperative with examination Head: Normocephalic and atraumatic, without obvious abnormality EENT: No OP obstruction, dry mucous membranes, normal conjunctivae LUNGS: Normal respiratory effort. Non-labored breathing on room air CV: Persistent bradycardia on cardiac monitor, heart rate in the 40's on hospital arrival  ABDOMEN: Soft, non-tender, non-distended Ext: warm, well perfused, without obvious deformity  NEURO:  Mental Status: Awake, alert, and oriented to self, place, time, and situation.  He is able to provide a clear and coherent history of present illness.  Speech is intact without dysarthria or aphasia.  Naming, repetition, fluency, and comprehension intact. No neglect is noted.  Cranial Nerves:  II: PERRL 3 mm/brisk. Visual fields full.  III, IV, VI: EOMI without ptosis,  nystagmus, or gaze preference.  V: Sensation is intact to light touch and symmetrical to face. VII: Face is asymmetric with subtle decreased elevation of the left mouth with smiling  VIII: Hearing is intact to voice IX, X: Palate elevation is symmetric. Phonation normal.  XI: Normal sternocleidomastoid and trapezius muscle strength XII: Tongue protrudes midline without fasciculations.   Motor:  Bilateral upper extremities with antigravity movement without pronator drift on assessment.  RLE prosthesis s/p traumatic amputation. He is able to overcome antigravity movement without vertical drift of the prosthetic. LLE 4/5 strength with some antigravity movement but with vertical drift on assessment.  Tone is normal. Bulk is normal.  Sensation: Intact to light touch bilaterally in bilateral upper extremities. Left lower extremity sensation to light touch symmetric with right upper extremity light touch (due to traumatic amputation of RLE) Coordination: FTN intact bilaterally. UTA HKS due to weakness of LLE and prothesis of RLE.  DTRs: 3+ in left patellae, 3+ and symmetric bilateral biceps and brachioradialis Gait: Mildly unsteady without use of assistive device, patient complains of feeling imbalanced with ambulation and endorses gait significantly different from baseline gait.  Notably he denies any worsening or improvement of his headache with position changes such as this such as this  NIHSS: 1a Level of Conscious.: 0 1b LOC Questions: 0 1c LOC Commands: 0 2 Best Gaze: 0 3 Visual: 0 4 Facial Palsy: 1 5a Motor Arm - left: 0 5b Motor Arm - Right: 0 6a Motor Leg - Left: 1 6b Motor Leg - Right: 0 7 Limb Ataxia: 0 8 Sensory: 1 9 Best Language: 0 10 Dysarthria: 0 11 Extinct. and Inatten.: 0 TOTAL: 3  Labs I have reviewed labs in epic and the results pertinent to this consultation are: CBC    Component Value Date/Time   WBC 8.7 12/05/2020 1118   RBC 4.06 (L) 12/05/2020 1118   HGB 13.2  12/05/2020 1118   HCT 39.6 12/05/2020 1118   PLT 518 (H) 12/05/2020 1118   MCV 97.5 12/05/2020 1118   MCH 32.5 12/05/2020 1118   MCHC 33.3 12/05/2020 1118   RDW 13.5 12/05/2020 1118   LYMPHSABS 809 (L) 12/05/2020 1118   MONOABS 2.3 (H) 03/04/2018 0944   EOSABS 87 12/05/2020 1118   BASOSABS 113 12/05/2020 1118   CMP     Component Value Date/Time   NA 141 12/05/2020 1118   K 4.9 12/05/2020 1118   CL 105 12/05/2020 1118  CO2 30 12/05/2020 1118   GLUCOSE 94 12/05/2020 1118   BUN 12 12/05/2020 1118   CREATININE 1.11 12/05/2020 1118   CALCIUM 9.5 12/05/2020 1118   PROT 6.1 12/05/2020 1118   ALBUMIN 3.6 03/04/2018 0944   AST 17 12/05/2020 1118   ALT 15 12/05/2020 1118   ALKPHOS 79 03/04/2018 0944   BILITOT 0.3 12/05/2020 1118   GFRNONAA 62 12/05/2020 1118   GFRAA 72 12/05/2020 1118   Lipid Panel     Component Value Date/Time   CHOL 213 (H) 12/05/2020 1118   TRIG 296 (H) 12/05/2020 1118   HDL 44 12/05/2020 1118   CHOLHDL 4.8 12/05/2020 1118   VLDL 35 (H) 01/11/2017 1202   LDLCALC 127 (H) 12/05/2020 1118   Lab Results  Component Value Date   HGBA1C 5.9 (H) 12/05/2020   Imaging I have reviewed the images obtained:  CT-scan of the brain 12/21/2020: 1. No acute abnormality 2. ASPECTS is 10 3. Progression of atrophy and chronic microvascular ischemic change since 2017  CT angio head and neck WWO contrast 12/21/2020: 1. Negative for intracranial large vessel occlusion 2. Mild stenosis of the M1/ M2 segment bilaterally. Posterior circulation widely patent. 3. Mild atherosclerotic disease in the carotid bifurcation. No significant carotid or vertebral artery stenosis in the neck.  MRI examination of the brain 12/21/2020 Negative for acute infarct. Atrophy and chronic microvascular ischemic changes in the white matter.  Assessment: 80 y.o. male who presented for evaluation of bitemporal headache and blurred vision with onset at 09:00 and left arm numbness, gait  instability/incoordination, and nausea and vomiting with onset at 15:00.  - Examination reveals patient with left lower extremity slight weakness with vertical drift, left hand numbness, subtle decreased elevation of the left mouth with smiling, and ongoing complaints of bitemporal headache 6/10 in severity without history of migraine headaches. Initial NIHSS of 3.  - CTH imaging without hemorrhage or evidence of acute abnormality and ASPECTS of 10. Vessel imaging obtained revealing no evidence of large vessel occlusion, posterior circulation that is widely patent, mild atherosclerotic disease in the carotid bifurcation, and mild stenosis of M1/M2 segment bilaterally.  - Due to ongoing concern for possible posterior circulation or brainstem ischemic stroke with acute gait instability and imbalance as well as visual changes, will obtain full stroke work up and recommend inpatient admission.  - tPA not given due to last known well time > 4.5 hours prior to hospital arrival. Presentation not consistent with LVO and vessel imaging reviewed by attending provider without concern for LVO on CTA.  - Ddx includes complex migraine headache but with low suspicion for onset at late age without history of headaches or migraine headaches.  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 - Recent lipid panel and A1c results, no need to repeat at this time - MRI brain without contrast when able to obtain - Frequent neuro checks - Echocardiogram - Prophylactic therapy-Antiplatelet med: Aspirin - dose 330m PO or 3034mPR once followed by ASA 81 mg PO daily  - Risk factor modification - Telemetry monitoring - PT consult, OT consult, Speech consult - Stroke team to follow  #Hyperreflexia -Given notable hyperreflexia will  additionally obtain MRI cervical spine to rule out compressive etiology of his symptoms  StAnibal HendersonAGAC-NP [facility] Pager: [phone] Neurologist's note:  I personally saw  this patient, gathering history, performing a full neurologic examination, reviewing relevant labs, personally reviewing relevant imaging including head CT, CTA and MRI brain, and formulated the assessment and plan, adding to the note above for completeness and clarity to accurately reflect my thoughts  Lesleigh Noe MD-PhD [facility] [phone]  Available 7 AM to 7 PM, outside these hours please contact Neurologist on call listed on AMION

## 2020-12-21 NOTE — ED Triage Notes (Signed)
Pt woke up at 0500, blurred vision started at 0900, HA & n/v stared at 1300 & the blurred vision resolved. He then noticed Lt arm/palm weakness/numbness with unbalanced gate.Upon arrival to ED slight Lt sided facial drift noted. A/Ox4, verbal, GCS 15, c/o 6/10 HA pain.

## 2020-12-21 NOTE — H&P (Signed)
History and Physical    JESSON FOSKEY BJS:[id number] DOB: December 10, 1940 DOA: 12/21/2020  PCP: Unk Pinto, MD  Patient coming from: Home, significant other at bedside  I have personally briefly reviewed patient's old medical records in [place]  Chief Complaint: Blurred vision and headache  HPI: Leslie KLIEBERT is a 80 y.o. 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  Patient states he woke up this morning around 5 AM since he has to open the gate to a flea market.  He already felt like he was walking differently and staggering.  Then around 930 he began to have trouble focusing his vision, had 10 out of 10 bilateral temporal throbbing headache.  He reports having some on and off numbness and tingling of his thumb and index finger for the past several weeks but today this became more constant.  The numbness also radiated higher into his left arm.  His significant other came to pick him up at 130pm and he began to have nausea and vomiting.  He then presented to urgent care and was told to present to the ED.  States that by the time he got to the ER around 2:58 PM that his vision had improved.  His headache is now a 5 out of 10.  Feels that his numbness and tingling of his left hand is improved but still present.  He denies any current tobacco use.  He quit about a year ago after smoking for 40 years.  Denies any alcohol or illicit drug use.  ED Course:  Patient initially presented as a code stroke with negative CT head and  CTA head and neck without any large vessel occlusion. He had temperature of 97.4, heart rate of 48 with BP of 160/77.  Mild leukocytosis with WBC 11.2.  BG of 127.  MRI of the brain later also returned negative.  Troponin pending at time of  admission.  EKG shows sinus bradycardia but no significant ST or T wave changes.  Hospitalist on-call for admission for continual stroke work-up.  Review of Systems: Constitutional: No Weight Change, No Fever ENT/Mouth: No sore throat, No Rhinorrhea Eyes: No Eye Pain, No Vision Changes Cardiovascular: No Chest Pain, no SOB, No Palpitations Respiratory: No Cough, No Sputum  Gastrointestinal: No Nausea, No Vomiting, No Diarrhea, No Constipation, No Pain Genitourinary: no Urinary Incontinence, No Urgency, No Flank Pain Musculoskeletal: No Arthralgias, No Myalgias Skin: No Skin Lesions, No Pruritus, Neuro: no Weakness, + Numbness,  No Loss of Consciousness, No Syncope Psych: No Anxiety/Panic, No Depression, no decrease appetite Heme/Lymph: No Bruising, No Bleeding  Past Medical History:  Diagnosis Date   Aortic atherosclerosis ([place]) 03/04/2018   Noted on CT Abd/pelvis   Cataract    Bilateral   Colon polyp    COPD (chronic obstructive pulmonary disease) (HCC)    Coronary artery disease    Diverticulosis 07/16/2016   SIGMOID, NOTED ON COLONOSCOPY   Esophageal stenosis 02/24/2018   noted on endoscopy   Esophageal stricture    GERD (gastroesophageal reflux disease)    Grade I diastolic dysfunction 60/73/7106   noted on ECHO   History of hiatal hernia 02/24/2018   Small noted on endoscopy   Hydronephrosis    Hydronephrosis of right kidney 03/04/2018   URETERAL STONE 7 MM, NOTED ONJ CT  ABD/PELVIS   Hyperlipidemia    Myocardial infarction [facility])    [id number]   Nephrolithiasis    s/p stent placement   Skin cancer    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 NOTED ON CT ABD/PELVIS   Vitamin D deficiency    Wears dentures    upper    Past Surgical History:  Procedure Laterality Date   CARDIAC CATHETERIZATION  1983   No intervention performed.    CATARACT EXTRACTION, BILATERAL     COLONOSCOPY     COLONOSCOPY W/ POLYPECTOMY   08/12/2010   Dr. Silvano Rusk   COLONOSCOPY W/ POLYPECTOMY  07/16/2016   CYSTOSCOPY W/ URETERAL STENT PLACEMENT Right 03/04/2018   Procedure: CYSTOSCOPY WITH RETROGRADE PYELOGRAM/URETERAL STENT PLACEMENT;  Surgeon: [name], MD;  Location: [facility];  Service: Urology;  Laterality: Right;   CYSTOSCOPY/URETEROSCOPY/HOLMIUM LASER/STENT PLACEMENT Right 03/16/2018   Procedure: CYSTOSCOPY RIGHT URETEROSCOPY/HOLMIUM LASER/STENT PLACEMENT;  Surgeon: [name], MD;  Location: [facility];  Service: Urology;  Laterality: Right;   ESOPHAGOGASTRODUODENOSCOPY     INGUINAL HERNIA REPAIR     left   right leg amputation  05/14/1977   SHOULDER ARTHROSCOPY Left 2000   UPPER GASTROINTESTINAL ENDOSCOPY  WITH DILATION  02/24/2018     reports that he quit smoking about 20 years ago. His smoking use included cigarettes. He has a 60.00 pack-year smoking history. He has never used smokeless tobacco. He reports previous alcohol use. He reports that he does not use drugs. Social History  No Known Allergies  Family History  Problem Relation Age of Onset   Hypertension Father    Pancreatic cancer Mother    Cancer Mother    Colon cancer Brother    Leukemia Brother    Prostate cancer Brother    Other Brother        polio   Esophageal cancer Neg Hx    Rectal cancer Neg Hx    Stomach cancer Neg Hx      Prior to Admission medications   Medication Sig Start Date End Date Taking? Authorizing Provider  albuterol (PROVENTIL) (2.5 MG/3ML) 0.083% nebulizer solution Inhale 1 vial by Nebulizer 4 x /day or every 4 hr to rescue Asthma 04/07/19   Unk Pinto, MD  albuterol (VENTOLIN HFA) 108 (90 Base) MCG/ACT inhaler Inhale 2 puffs into the lungs every 4 (four) hours as needed for wheezing or shortness of breath. Make sure you rinse mouth out after each use. 08/29/19   Garnet Sierras, NP  calcium carbonate (CALCIUM 600) 600 MG TABS tablet Take 1 tablet (600 mg total) by mouth 2 (two)  times daily with a meal. 07/29/18   McClanahan, Kyra, NP  Ensure (ENSURE) Take 237 mLs by mouth. Drinks one with his meals----per patient-09/04/2020    [provider]  ezetimibe (ZETIA) 10 MG tablet Take 1 tablet (10 mg total) by mouth daily. 09/05/20 09/05/21  Garnet Sierras, NP  lidocaine (XYLOCAINE) 2 % solution Use as directed 15 mLs in the mouth or throat as needed for mouth pain. 09/07/19   Vladimir Crofts, PA-C  loratadine (CLARITIN) 10 MG tablet TAKE 1 TABLET BY MOUTH DAILY FOR ALLERGIES 09/04/20   Garnet Sierras, NP  mirtazapine (REMERON) 15 MG tablet Take one tablet by mouth at bedtime for weight. 09/04/20   Garnet Sierras, NP  nitroGLYCERIN (NITROSTAT) 0.4 MG SL tablet Place 1 tablet (0.4 mg total) under the tongue every  5 (five) minutes x 3 doses as needed for chest pain. 11/14/11   Dunn, Nedra Hai, PA-C  olmesartan-hydrochlorothiazide (BENICAR HCT) 20-12.5 MG tablet TAKE 1 TABLET DAILY FOR BLOOD PRESSURE 11/26/19   Unk Pinto, MD  omeprazole (PRILOSEC) 40 MG capsule TAKE 1 CAPSULE DAILY TO PREVENT HEARTBURN & INDIGESTION 06/26/20   Liane Comber, NP  oxybutynin (DITROPAN-XL) 10 MG 24 hr tablet TAKE 1 TABLET DAILY FOR BLADDER CONTROL 10/12/20   Liane Comber, NP  predniSONE (DELTASONE) 5 MG tablet TAKE 1 TABLET BY MOUTH TWICE A DAY AS DIRECTED FOR COPD 11/27/20   Unk Pinto, MD  Respiratory Therapy Supplies (NEBULIZER/TUBING/MOUTHPIECE) KIT Disp one nebulizer machine, tubing set and mouthpiece kit for use 4 times daily 07/18/20   Unk Pinto, MD  rosuvastatin (CRESTOR) 20 MG tablet TAKE 1 TABLET BY MOUTH EVERY DAY FOR CHOLESTEROL 04/17/20   Liane Comber, NP  tadalafil (CIALIS) 20 MG tablet Take 1/2 to 1 tablet every 2 to 3 days if needed for XXXX 07/10/20   Unk Pinto, MD  tiZANidine (ZANAFLEX) 4 MG tablet TAKE 1 TABLET 3 X /DAY FOR BACK PAIN & MUSCLE SPASMS 08/08/20   Liane Comber, NP  VITAMIN D PO Take 5,000 Units by mouth daily.    [provider]   zinc gluconate 50 MG tablet Take 50 mg by mouth daily. Takes some days. Patient not taking: Reported on 12/05/2020    [provider]    Physical Exam: Vitals:   12/21/20 1600 12/21/20 1630 12/21/20 1818 12/21/20 1830  BP: (!) 157/94 (!) 157/94 (!) 166/77 (!) 168/73  Pulse: 62 (!) 53 (!) 48 (!) 43  Resp: _0 Temp: (!) 97.4 F (36.3 C)     TempSrc: Axillary     SpO2:  100% 100% 100%  Weight:        Constitutional: NAD, calm, comfortable, thin frail elderly male laying at 20 degree incline in bed Vitals:   12/21/20 1600 12/21/20 1630 12/21/20 1818 12/21/20 1830  BP: (!) 157/94 (!) 157/94 (!) 166/77 (!) 168/73  Pulse: 62 (!) 53 (!) 48 (!) 43  Resp: _1 Temp: (!) 97.4 F (36.3 C)     TempSrc: Axillary     SpO2:  100% 100% 100%  Weight:       Eyes: PERRL, lids and conjunctivae normal ENMT: Mucous membranes are moist.  Neck: normal, supple, Respiratory: Occasional faint wheezing heard but no crackles. Normal respiratory effort. No accessory muscle use.  Cardiovascular: Regular rate and rhythm, no murmurs / rubs / gallops. No extremity edema. 2+ pedal pulse. No carotid bruits.  Abdomen: no tenderness, no masses palpated. . Bowel sounds positive.  Musculoskeletal: no clubbing / cyanosis.  Right BKA. Skin: no rashes, lesions, ulcers. No induration Neurologic: No Nystagmus.  CN 2-12 grossly intact. Sensation intact,  Strength 5/5 in all 4.  Psychiatric: Normal judgment and insight. Alert and oriented x 3. Normal mood.    Labs on Admission: I have personally reviewed following labs and imaging studies  CBC: Recent Labs  Lab 12/21/20 1546 12/21/20 1548  WBC 11.2*  --   NEUTROABS 9.6*  --   HGB 13.3 13.9  HCT 40.2 41.0  MCV 99.5  --   PLT 266  --    Basic Metabolic Panel: Recent Labs  Lab 12/21/20 1546 12/21/20 1548  NA 139 139  K 4.1 4.0  CL 107 106  CO2 24  --   GLUCOSE 127* 119*  BUN 13  14  CREATININE 1.15 1.20  CALCIUM 9.3  --     GFR: Estimated Creatinine Clearance: 41.5 mL/min (by C-G formula based on SCr of 1.2 mg/dL). Liver Function Tests: Recent Labs  Lab 12/21/20 1546  AST 19  ALT 17  ALKPHOS 54  BILITOT 0.7  PROT 6.6  ALBUMIN 3.7   No results for input(s): LIPASE, AMYLASE in the last 168 hours. No results for input(s): AMMONIA in the last 168 hours. Coagulation Profile: Recent Labs  Lab 12/21/20 1546  INR 1.0   Cardiac Enzymes: No results for input(s): CKTOTAL, CKMB, CKMBINDEX, TROPONINI in the last 168 hours. BNP (last 3 results) No results for input(s): PROBNP in the last 8760 hours. HbA1C: No results for input(s): HGBA1C in the last 72 hours. CBG: Recent Labs  Lab 12/21/20 1542  GLUCAP 125*   Lipid Profile: No results for input(s): CHOL, HDL, LDLCALC, TRIG, CHOLHDL, LDLDIRECT in the last 72 hours. Thyroid Function Tests: No results for input(s): TSH, T4TOTAL, FREET4, T3FREE, THYROIDAB in the last 72 hours. Anemia Panel: No results for input(s): VITAMINB12, FOLATE, FERRITIN, TIBC, IRON, RETICCTPCT in the last 72 hours. Urine analysis:    Component Value Date/Time   COLORURINE YELLOW 06/06/2020 0958   APPEARANCEUR CLEAR 06/06/2020 0958   LABSPEC 1.015 06/06/2020 0958   PHURINE 5.5 06/06/2020 [place] 06/06/2020 0958   HGBUR NEGATIVE 06/06/2020 0958   BILIRUBINUR NEGATIVE 03/04/2018 0948   KETONESUR NEGATIVE 06/06/2020 0958   PROTEINUR NEGATIVE 06/06/2020 0958   NITRITE NEGATIVE 06/06/2020 0958   LEUKOCYTESUR NEGATIVE 06/06/2020 0958    Radiological Exams on Admission: MR BRAIN WO CONTRAST  Result Date: 12/21/2020 CLINICAL DATA:  Acute neuro deficit.  Rule out stroke EXAM: MRI HEAD WITHOUT CONTRAST TECHNIQUE: Multiplanar, multiecho pulse sequences of the brain and surrounding structures were obtained without intravenous contrast. COMPARISON:  CT angio head and neck 12/21/2020 FINDINGS: Brain: Negative for acute infarct. Moderate white matter changes  consistent with chronic microvascular ischemia. Small area of encephalomalacia left inferior frontal lobe compatible with chronic infarct. Negative for mass lesion. Chronic microhemorrhage right parietal periventricular white matter. Mild cerebral atrophy. Vascular: Normal arterial flow voids. Skull and upper cervical spine: Negative Sinuses/Orbits: Paranasal sinuses clear. Bilateral cataract extraction Other: Leslie Cardenas IMPRESSION: Negative for acute infarct. Atrophy and chronic microvascular ischemic changes in the white matter. Electronically Signed   By: Franchot Gallo M.D.   On: 12/21/2020 17:53   CT HEAD CODE STROKE WO CONTRAST  Result Date: 12/21/2020 CLINICAL DATA:  Code stroke. Acute neuro deficit. Headache and blurred vision EXAM: CT HEAD WITHOUT CONTRAST TECHNIQUE: Contiguous axial images were obtained from the base of the skull through the vertex without intravenous contrast. COMPARISON:  CT head 04/22/2016 FINDINGS: Brain: Mild atrophy has progressed. Moderate white matter changes bilaterally have progressed. Negative for acute infarct, hemorrhage, mass. Vascular: Negative for hyperdense vessel Skull: Negative Sinuses/Orbits: Paranasal sinuses clear. Bilateral cataract extraction Other: Leslie Cardenas ASPECTS (Ord Stroke Program Early CT Score) - Ganglionic level infarction (caudate, lentiform nuclei, internal capsule, insula, M1-M3 cortex): 7 - Supraganglionic infarction (M4-M6 cortex): 3 Total score (0-10 with 10 being normal): 10 IMPRESSION: 1. No acute abnormality 2. ASPECTS is 10 3. Progression of atrophy and chronic microvascular ischemic change since 2017 4. Code stroke imaging results were communicated on 12/21/2020 at 3:56 pm to provider Bhagat via text page Electronically Signed   By: Franchot Gallo M.D.   On: 12/21/2020 15:57   CT ANGIO HEAD NECK W WO CM (CODE STROKE)  Result  Date: 12/21/2020 CLINICAL DATA:  Acute neuro deficit.  Headache and blurred vision EXAM: CT ANGIOGRAPHY HEAD AND NECK  TECHNIQUE: Multidetector CT imaging of the head and neck was performed using the standard protocol during bolus administration of intravenous contrast. Multiplanar CT image reconstructions and MIPs were obtained to evaluate the vascular anatomy. Carotid stenosis measurements (when applicable) are obtained utilizing NASCET criteria, using the distal internal carotid diameter as the denominator. CONTRAST:  128m OMNIPAQUE IOHEXOL 350 MG/ML SOLN COMPARISON:  CT head 12/21/2020 FINDINGS: CTA NECK FINDINGS Aortic arch: Atherosclerotic disease in the aortic arch. Atherosclerotic disease and mild stenosis in the proximal left subclavian artery. Right carotid system: Mild atherosclerotic disease right carotid bifurcation without significant stenosis. Left carotid system: Mild atherosclerotic disease left carotid bifurcation without significant stenosis. Vertebral arteries: Both vertebral arteries patent to the skull base without significant stenosis. Skeleton: Multilevel cervical spondylosis. No acute skeletal abnormality. Other neck: Negative for mass or adenopathy in the neck. Upper chest: Apical emphysema and subpleural blebs. No acute abnormality. Review of the MIP images confirms the above findings CTA HEAD FINDINGS Anterior circulation: Internal carotid artery widely patent through the cavernous and supraclinoid segment. Hypoplastic right A1 segment. Both anterior cerebral arteries patent supplied primarily from the left. Mild stenosis in the middle cerebral artery at the M1/ M2 junction bilaterally. No branch occlusion. Posterior circulation: Both vertebral arteries patent to the basilar. Right PICA patent. Left PICA not visualized. Basilar patent. AICA patent bilaterally. Superior cerebellar and posterior cerebral arteries patent bilaterally without stenosis or large vessel occlusion. Venous sinuses: Normal venous enhancement Anatomic variants: Leslie Cardenas Review of the MIP images confirms the above findings IMPRESSION: 1.  Negative for intracranial large vessel occlusion 2. Mild stenosis of the M1/ M2 segment bilaterally. Posterior circulation widely patent. 3. Mild atherosclerotic disease in the carotid bifurcation. No significant carotid or vertebral artery stenosis in the neck. Electronically Signed   By: CFranchot GalloM.D.   On: 12/21/2020 16:24      Assessment/Plan  Vision changes/headache/paresthesia  - concerning for TIA vs complex migraine. Neurology also has concerns of myelopathy and has ordered MRI C-spine. -- MRI brain negative - CTA head and neck negative for LVO with mild stenosis of the M1/M2 segment - obtain echocardiogram  - start daily aspirin and continue statin  -Last A1c -5.9 in June  and lipids with total cholesterol at 213, LDL at 127 -PT/OT/SLT -Frequent neuro checks and keep on telemetry -Allow for permissive hypertension with blood pressure treatment as needed only if systolic goes above 2625-neurology is following. Appreciate recommendations  Hx of sinus bradycardia -Monitor on continuous telemetry  COPD -not in acute exacerbation  CKD stage IIIb -Creatinine stable  Prediabetes - Last hemoglobin A1c of 5.9 on 12/05/2020  Dysphagia  -has hx of GERD with stricture. Last dilation on 07/26/2020. States he really has been having more trouble swallowing and has noticed weight loss. Recommend follow up with GI for repeat endoscopy.  DVT prophylaxis:.Lovenox Code Status: Full Family Communication: Plan discussed with patient and significant other at bedside  disposition Plan: Home with observation Consults called:  Admission status: Observation  Level of care: Telemetry Cardiac  Status is: Observation  The patient remains OBS appropriate and will d/c before 2 midnights.  Dispo: The patient is from: Home              Anticipated d/c is to: Home              Patient currently is not medically stable  to d/c.   Difficult to place patient No         Orene Desanctis  DO [facility]   If 7PM-7AM, please contact night-coverage [email or web address]   12/21/2020, 7:15 PM

## 2020-12-22 ENCOUNTER — Observation Stay (HOSPITAL_BASED_OUTPATIENT_CLINIC_OR_DEPARTMENT_OTHER): Payer: Medicare HMO

## 2020-12-22 DIAGNOSIS — G459 Transient cerebral ischemic attack, unspecified: Secondary | ICD-10-CM

## 2020-12-22 DIAGNOSIS — H538 Other visual disturbances: Secondary | ICD-10-CM | POA: Diagnosis not present

## 2020-12-22 LAB — ECHOCARDIOGRAM COMPLETE
Area-P 1/2: 2.16 cm2
Height: 68 in
S' Lateral: 2 cm
Weight: 2063.51 oz

## 2020-12-22 MED ORDER — EZETIMIBE 10 MG PO TABS
10.0000 mg | ORAL_TABLET | Freq: Every day | ORAL | Status: DC
  Administered 2020-12-22: 10 mg via ORAL
  Filled 2020-12-22: qty 1

## 2020-12-22 MED ORDER — ASPIRIN 81 MG PO TBEC: 81.0000 mg | DELAYED_RELEASE_TABLET | Freq: Every day | ORAL | 0 refills | Status: DC

## 2020-12-22 MED ORDER — PANTOPRAZOLE SODIUM 40 MG PO TBEC
40.0000 mg | DELAYED_RELEASE_TABLET | Freq: Every day | ORAL | Status: DC
  Administered 2020-12-22: 40 mg via ORAL
  Filled 2020-12-22: qty 1

## 2020-12-22 MED ORDER — ENSURE ENLIVE PO LIQD
237.0000 mL | Freq: Two times a day (BID) | ORAL | Status: DC
  Administered 2020-12-22: 237 mL via ORAL

## 2020-12-22 MED ORDER — ROSUVASTATIN CALCIUM 20 MG PO TABS
20.0000 mg | ORAL_TABLET | Freq: Every day | ORAL | Status: DC
  Administered 2020-12-22: 20 mg via ORAL
  Filled 2020-12-22: qty 1

## 2020-12-22 MED ORDER — OXYBUTYNIN CHLORIDE ER 5 MG PO TB24
10.0000 mg | ORAL_TABLET | Freq: Every day | ORAL | Status: DC
  Administered 2020-12-22: 10 mg via ORAL
  Filled 2020-12-22: qty 2

## 2020-12-22 MED ORDER — MIRTAZAPINE 15 MG PO TABS
15.0000 mg | ORAL_TABLET | Freq: Every day | ORAL | Status: DC
  Administered 2020-12-22: 15 mg via ORAL
  Filled 2020-12-22: qty 1

## 2020-12-22 NOTE — Progress Notes (Signed)
SLP Cancellation Note  Patient Details Name: Leslie Cardenas MRN: [id number] DOB: 1941-04-04   Cancelled treatment:       Reason Eval/Treat Not Completed: SLP screened, no needs identified, will sign off. Per review of PT's evaluation note and speaking with OT, no formal SLP evaluation warranted at this time as patient appears to be at baseline and is WFL-WNL for cognitive-linguistic abilities. Thank you for this referral!   Sonia Baller, MA, [facility]

## 2020-12-22 NOTE — Discharge Summary (Signed)
Physician Discharge Summary  Leslie Cardenas LKJ:[id number] DOB: 29-Aug-1940 DOA: 12/21/2020  PCP: Unk Pinto, MD  Admit date: 12/21/2020 Discharge date: 12/22/2020  Admitted From: home Disposition:  home  Recommendations for Outpatient Follow-up:  Follow up with PCP in 1-2 weeks Please obtain BMP/CBC in one week Please follow up on the following pending results:  Discharge Condition:Stable CODE STATUS:Full Diet recommendation: Heart healthy   Brief/Interim Summary: 80 y.o. 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   Patient states he woke up this morning around 5 AM since he has to open the gate to a flea market.  He already felt like he was walking differently and staggering.  Then around 930 he began to have trouble focusing his vision, had 10 out of 10 bilateral temporal throbbing headache.  He reports having some on and off numbness and tingling of his thumb and index finger for the past several weeks but today this became more constant.  The numbness also radiated higher into his left arm.  His significant other came to pick him up at 130pm and he began to have nausea and vomiting.  He then presented to urgent care and was told to present to the ED.  States that by the time he got to the ER around 2:58 PM that his vision had improved.  His headache is now a 5 out of 10.  Feels that his numbness and tingling of his left hand is improved but still present.   He denies any current tobacco use.  He quit about a year ago after smoking for 40 years.  Denies any alcohol or illicit drug use.  Discharge Diagnoses:  Principal Problem:   TIA (transient ischemic attack) Active Problems:   Blurred vision  Vision changes/headache/paresthesia -- MRI brain negative - CTA  head and neck negative for LVO with mild stenosis of the M1/M2 segment - 2d reviewed with normal LVEF - started daily aspirin and continue statin  -Last A1c -5.9 in June  and lipids with total cholesterol at 213, LDL at 127 -neurology was consulted. Recommendation to continue ASA, OK to d/c per Neurology   Hx of sinus bradycardia -remained stable on tele   COPD -not in acute exacerbation   CKD stage IIIb -Creatinine stable   Prediabetes - Last hemoglobin A1c of 5.9 on 12/05/2020   Dysphagia -has hx of GERD with stricture. Last dilation on 07/26/2020.  -Recommend follow up with GI for repeat endoscopy.    Discharge Instructions   Allergies as of 12/22/2020   No Known Allergies      Medication List     TAKE these medications    albuterol (2.5 MG/3ML) 0.083% nebulizer solution Commonly known as: PROVENTIL Inhale 1 vial by Nebulizer 4 x /day or every 4 hr to rescue Asthma   albuterol 108 (90 Base) MCG/ACT inhaler Commonly known as: Ventolin HFA Inhale 2 puffs into the lungs every 4 (four) hours as needed for wheezing or shortness of breath. Make sure you rinse mouth out after each use.   calcium carbonate 600 MG Tabs tablet Commonly known as: Calcium 600 Take 1 tablet (600 mg total) by mouth 2 (two) times daily with a meal.   Ensure Take 237 mLs by mouth. Drinks one with his meals----per patient-09/04/2020   ezetimibe 10 MG tablet Commonly  known as: Zetia Take 1 tablet (10 mg total) by mouth daily.   lidocaine 2 % solution Commonly known as: XYLOCAINE Use as directed 15 mLs in the mouth or throat as needed for mouth pain.   loratadine 10 MG tablet Commonly known as: CLARITIN TAKE 1 TABLET BY MOUTH DAILY FOR ALLERGIES   mirtazapine 15 MG tablet Commonly known as: Remeron Take one tablet by mouth at bedtime for weight.   Nebulizer/Tubing/Mouthpiece Kit Disp one nebulizer machine, tubing set and mouthpiece kit for use 4 times daily   nitroGLYCERIN 0.4 MG SL  tablet Commonly known as: NITROSTAT Place 1 tablet (0.4 mg total) under the tongue every 5 (five) minutes x 3 doses as needed for chest pain.   olmesartan-hydrochlorothiazide 20-12.5 MG tablet Commonly known as: BENICAR HCT TAKE 1 TABLET DAILY FOR BLOOD PRESSURE   omeprazole 40 MG capsule Commonly known as: PRILOSEC TAKE 1 CAPSULE DAILY TO PREVENT HEARTBURN & INDIGESTION   oxybutynin 10 MG 24 hr tablet Commonly known as: DITROPAN-XL TAKE 1 TABLET DAILY FOR BLADDER CONTROL   predniSONE 5 MG tablet Commonly known as: DELTASONE TAKE 1 TABLET BY MOUTH TWICE A DAY AS DIRECTED FOR COPD   rosuvastatin 20 MG tablet Commonly known as: CRESTOR TAKE 1 TABLET BY MOUTH EVERY DAY FOR CHOLESTEROL   tadalafil 20 MG tablet Commonly known as: CIALIS Take 1/2 to 1 tablet every 2 to 3 days if needed for XXXX   tiZANidine 4 MG tablet Commonly known as: ZANAFLEX TAKE 1 TABLET 3 X /DAY FOR BACK PAIN & MUSCLE SPASMS   VITAMIN D PO Take 5,000 Units by mouth daily.   zinc gluconate 50 MG tablet Take 50 mg by mouth daily. Takes some days.        Follow-up Information     Unk Pinto, MD Follow up in 2 week(s).   Specialty: Internal Medicine Why: Hospital follow up Contact information: [address] [place] [address] [phone]                No Known Allergies  Consultations: Neurology  Procedures/Studies: MR BRAIN WO CONTRAST  Result Date: 12/21/2020 CLINICAL DATA:  Acute neuro deficit.  Rule out stroke EXAM: MRI HEAD WITHOUT CONTRAST TECHNIQUE: Multiplanar, multiecho pulse sequences of the brain and surrounding structures were obtained without intravenous contrast. COMPARISON:  CT angio head and neck 12/21/2020 FINDINGS: Brain: Negative for acute infarct. Moderate white matter changes consistent with chronic microvascular ischemia. Small area of encephalomalacia left inferior frontal lobe compatible with chronic infarct. Negative for mass lesion.  Chronic microhemorrhage right parietal periventricular white matter. Mild cerebral atrophy. Vascular: Normal arterial flow voids. Skull and upper cervical spine: Negative Sinuses/Orbits: Paranasal sinuses clear. Bilateral cataract extraction Other: None IMPRESSION: Negative for acute infarct. Atrophy and chronic microvascular ischemic changes in the white matter. Electronically Signed   By: Franchot Gallo M.D.   On: 12/21/2020 17:53   MR CERVICAL SPINE WO CONTRAST  Result Date: 12/21/2020 CLINICAL DATA:  Initial evaluation for myelopathy, acute or progressive. EXAM: MRI CERVICAL SPINE WITHOUT CONTRAST TECHNIQUE: Multiplanar, multisequence MR imaging of the cervical spine was performed. No intravenous contrast was administered. COMPARISON:  Radiograph from 04/22/2016. FINDINGS: Alignment: Straightening of the normal cervical lordosis. Trace retrolisthesis of C3 on C4, with 3 mm anterolisthesis of C4 on C5. 2 mm retrolisthesis of C5 on C6, with 2 mm anterolisthesis of C7 on T1. Findings chronic and facet mediated. Vertebrae: Mild chronic height loss noted at the superior endplates of C7 through  T2. No acute or subacute compression fracture. Bone marrow signal intensity within normal limits. No worrisome osseous lesions. No abnormal marrow edema. Cord: Normal signal and morphology. Posterior Fossa, vertebral arteries, paraspinal tissues: Probable chronic microvascular ischemic disease noted within the partially visualized pons. Visualized brain and posterior fossa otherwise unremarkable. Paraspinous and prevertebral soft tissues within normal limits. Normal flow voids seen within the vertebral arteries bilaterally. Disc levels: C2-C3: Tiny central disc protrusion indents the ventral thecal sac. Mild facet hypertrophy. No canal or foraminal stenosis. C3-C4: Degenerative intervertebral disc space narrowing with diffuse disc osteophyte complex, eccentric to the right. Flattening and partial effacement of the ventral  thecal sac with resultant mild spinal stenosis. No cord impingement. Superimposed moderate bilateral facet hypertrophy. Resultant mild to moderate left with severe right C4 foraminal stenosis. C4-C5: Anterolisthesis. Disc bulge with left greater than right uncinate spurring. Severe left-sided facet degeneration. No spinal stenosis. Mild right with severe left C5 foraminal narrowing. C5-C6: Advanced degenerative intervertebral disc space narrowing with diffuse disc osteophyte complex. Broad posterior component flattens the ventral thecal sac with resultant mild to moderate spinal stenosis. Superimposed bilateral facet hypertrophy. Resultant severe left with moderate right C6 foraminal narrowing. C6-C7: Advanced degenerative intervertebral disc space narrowing with diffuse disc osteophyte complex. Flattening of the ventral thecal sac without significant spinal stenosis. Superimposed mild facet hypertrophy. Resultant severe left with moderate right C7 foraminal stenosis. C7-T1: Anterolisthesis without significant disc bulge. Moderate facet hypertrophy. No spinal stenosis. Foramina remain patent. Visualized upper thoracic spine demonstrates mild noncompressive disc bulging at T2-3 and T3-4 without stenosis. IMPRESSION: 1. No acute abnormality within the cervical spine. Normal MRI appearance of the cervical spinal cord with no evidence for myelopathy. 2. Multilevel cervical spondylosis with resultant mild to moderate spinal stenosis at C3-4 and C5-6. 3. Multifactorial degenerative changes with resultant multilevel foraminal narrowing as above. Notable findings include severe right C4 foraminal stenosis, severe left C5 foraminal narrowing, severe left with moderate right C6 and C7 foraminal stenosis. Electronically Signed   By: Jeannine Boga M.D.   On: 12/21/2020 23:01   ECHOCARDIOGRAM COMPLETE  Result Date: 12/22/2020    ECHOCARDIOGRAM REPORT   Patient Name:   Leslie Cardenas [facility] Date of Exam: 12/22/2020 Medical  Rec #:  [id number]       Height:       68.0 in Accession #:    [id number]      Weight:       129.0 lb Date of Birth:  1941-05-24        BSA:          1.696 m Patient Age:    80 years        BP:           133/69 mmHg Patient Gender: M               HR:           72 bpm. Exam Location:  Inpatient Procedure: 2D Echo Indications:    stroke  History:        Patient has prior history of Echocardiogram examinations, most                 recent 08/09/2017. COPD; Risk Factors:Hypertension and                 Dyslipidemia.  Sonographer:    Johny Chess Referring Phys: [id number] [place]  Sonographer Comments: Image acquisition challenging due to respiratory motion. IMPRESSIONS  1. Left ventricular ejection fraction, by  estimation, is 60 to 65%. The left ventricle has normal function. The left ventricle has no regional wall motion abnormalities. Left ventricular diastolic parameters were normal.  2. Right ventricular systolic function is normal. The right ventricular size is normal. There is mildly elevated pulmonary artery systolic pressure.  3. The mitral valve is normal in structure. No evidence of mitral valve regurgitation. No evidence of mitral stenosis.  4. The aortic valve is tricuspid. Aortic valve regurgitation is mild. Mild aortic valve sclerosis is present, with no evidence of aortic valve stenosis.  5. The inferior vena cava is normal in size with greater than 50% respiratory variability, suggesting right atrial pressure of 3 mmHg. Comparison(s): Prior images unable to be directly viewed, comparison made by report only. FINDINGS  Left Ventricle: Left ventricular ejection fraction, by estimation, is 60 to 65%. The left ventricle has normal function. The left ventricle has no regional wall motion abnormalities. The left ventricular internal cavity size was normal in size. There is  no left ventricular hypertrophy. Left ventricular diastolic parameters were normal. Normal left ventricular filling pressure.  Right Ventricle: The right ventricular size is normal. No increase in right ventricular wall thickness. Right ventricular systolic function is normal. There is mildly elevated pulmonary artery systolic pressure. The tricuspid regurgitant velocity is 2.97  m/s, and with an assumed right atrial pressure of 3 mmHg, the estimated right ventricular systolic pressure is 47.8 mmHg. Left Atrium: Left atrial size was normal in size. Right Atrium: Right atrial size was normal in size. Pericardium: There is no evidence of pericardial effusion. Mitral Valve: The mitral valve is normal in structure. No evidence of mitral valve regurgitation. No evidence of mitral valve stenosis. Tricuspid Valve: The tricuspid valve is normal in structure. Tricuspid valve regurgitation is not demonstrated. No evidence of tricuspid stenosis. Aortic Valve: The aortic valve is tricuspid. Aortic valve regurgitation is mild. Mild aortic valve sclerosis is present, with no evidence of aortic valve stenosis. Pulmonic Valve: The pulmonic valve was normal in structure. Pulmonic valve regurgitation is not visualized. No evidence of pulmonic stenosis. Aorta: The aortic root is normal in size and structure. Venous: The inferior vena cava is normal in size with greater than 50% respiratory variability, suggesting right atrial pressure of 3 mmHg. IAS/Shunts: No atrial level shunt detected by color flow Doppler.  LEFT VENTRICLE PLAX 2D LVIDd:         3.70 cm  Diastology LVIDs:         2.00 cm  LV e' medial:    8.59 cm/s LV PW:         1.00 cm  LV E/e' medial:  8.3 LV IVS:        0.90 cm  LV e' lateral:   10.40 cm/s LVOT diam:     2.10 cm  LV E/e' lateral: 6.9 LV SV:         74 LV SV Index:   43 LVOT Area:     3.46 cm  RIGHT VENTRICLE             IVC RV S prime:     17.80 cm/s  IVC diam: 1.70 cm TAPSE (M-mode): 3.0 cm LEFT ATRIUM             Index       RIGHT ATRIUM           Index LA diam:        2.80 cm 1.65 cm/m  RA Area:     12.70 cm  LA Vol (A2C):   37.6  ml 22.17 ml/m RA Volume:   29.60 ml  17.45 ml/m LA Vol (A4C):   29.2 ml 17.22 ml/m LA Biplane Vol: 33.5 ml 19.75 ml/m  AORTIC VALVE LVOT Vmax:   107.00 cm/s LVOT Vmean:  71.600 cm/s LVOT VTI:    0.213 m  AORTA Ao Root diam: 3.10 cm Ao Asc diam:  3.30 cm MITRAL VALVE               TRICUSPID VALVE MV Area (PHT): 2.16 cm    TR Peak grad:   35.3 mmHg MV Decel Time: 352 msec    TR Vmax:        297.00 cm/s MV E velocity: 71.60 cm/s MV A velocity: 78.30 cm/s  SHUNTS MV E/A ratio:  0.91        Systemic VTI:  0.21 m                            Systemic Diam: 2.10 cm Dani Gobble Croitoru MD Electronically signed by Sanda Klein MD Signature Date/Time: 12/22/2020/10:39:40 AM    Final    CT HEAD CODE STROKE WO CONTRAST  Result Date: 12/21/2020 CLINICAL DATA:  Code stroke. Acute neuro deficit. Headache and blurred vision EXAM: CT HEAD WITHOUT CONTRAST TECHNIQUE: Contiguous axial images were obtained from the base of the skull through the vertex without intravenous contrast. COMPARISON:  CT head 04/22/2016 FINDINGS: Brain: Mild atrophy has progressed. Moderate white matter changes bilaterally have progressed. Negative for acute infarct, hemorrhage, mass. Vascular: Negative for hyperdense vessel Skull: Negative Sinuses/Orbits: Paranasal sinuses clear. Bilateral cataract extraction Other: None ASPECTS ([place] Stroke Program Early CT Score) - Ganglionic level infarction (caudate, lentiform nuclei, internal capsule, insula, M1-M3 cortex): 7 - Supraganglionic infarction (M4-M6 cortex): 3 Total score (0-10 with 10 being normal): 10 IMPRESSION: 1. No acute abnormality 2. ASPECTS is 10 3. Progression of atrophy and chronic microvascular ischemic change since 2017 4. Code stroke imaging results were communicated on 12/21/2020 at 3:56 pm to provider Bhagat via text page Electronically Signed   By: Franchot Gallo M.D.   On: 12/21/2020 15:57   CT ANGIO HEAD NECK W WO CM (CODE STROKE)  Result Date: 12/21/2020 CLINICAL DATA:  Acute  neuro deficit.  Headache and blurred vision EXAM: CT ANGIOGRAPHY HEAD AND NECK TECHNIQUE: Multidetector CT imaging of the head and neck was performed using the standard protocol during bolus administration of intravenous contrast. Multiplanar CT image reconstructions and MIPs were obtained to evaluate the vascular anatomy. Carotid stenosis measurements (when applicable) are obtained utilizing NASCET criteria, using the distal internal carotid diameter as the denominator. CONTRAST:  166m OMNIPAQUE IOHEXOL 350 MG/ML SOLN COMPARISON:  CT head 12/21/2020 FINDINGS: CTA NECK FINDINGS Aortic arch: Atherosclerotic disease in the aortic arch. Atherosclerotic disease and mild stenosis in the proximal left subclavian artery. Right carotid system: Mild atherosclerotic disease right carotid bifurcation without significant stenosis. Left carotid system: Mild atherosclerotic disease left carotid bifurcation without significant stenosis. Vertebral arteries: Both vertebral arteries patent to the skull base without significant stenosis. Skeleton: Multilevel cervical spondylosis. No acute skeletal abnormality. Other neck: Negative for mass or adenopathy in the neck. Upper chest: Apical emphysema and subpleural blebs. No acute abnormality. Review of the MIP images confirms the above findings CTA HEAD FINDINGS Anterior circulation: Internal carotid artery widely patent through the cavernous and supraclinoid segment. Hypoplastic right A1 segment. Both anterior cerebral arteries patent supplied primarily from the left. Mild stenosis  in the middle cerebral artery at the M1/ M2 junction bilaterally. No branch occlusion. Posterior circulation: Both vertebral arteries patent to the basilar. Right PICA patent. Left PICA not visualized. Basilar patent. AICA patent bilaterally. Superior cerebellar and posterior cerebral arteries patent bilaterally without stenosis or large vessel occlusion. Venous sinuses: Normal venous enhancement Anatomic  variants: None Review of the MIP images confirms the above findings IMPRESSION: 1. Negative for intracranial large vessel occlusion 2. Mild stenosis of the M1/ M2 segment bilaterally. Posterior circulation widely patent. 3. Mild atherosclerotic disease in the carotid bifurcation. No significant carotid or vertebral artery stenosis in the neck. Electronically Signed   By: Franchot Gallo M.D.   On: 12/21/2020 16:24    Subjective: Eager to go home  Discharge Exam: Vitals:   12/22/20 0728 12/22/20 1114  BP: 133/69 (!) 144/63  Pulse: 68 63  Resp: (!) 23 (!) 22  Temp: 98 F (36.7 C) 97.9 F (36.6 C)  SpO2: 95% 96%   Vitals:   12/22/20 0245 12/22/20 0445 12/22/20 0728 12/22/20 1114  BP:  125/67 133/69 (!) 144/63  Pulse: (!) 51 (!) 52 68 63  Resp:  18 (!) 23 (!) 22  Temp: 98.4 F (36.9 C) 98.5 F (36.9 C) 98 F (36.7 C) 97.9 F (36.6 C)  TempSrc: Oral Oral Oral Oral  SpO2:  95% 95% 96%  Weight:      Height:        General: Pt is alert, awake, not in acute distress Cardiovascular: RRR, S1/S2 + Respiratory: CTA bilaterally, no wheezing, no rhonchi Abdominal: Soft, NT, ND, bowel sounds + Extremities: no edema, no cyanosis   The results of significant diagnostics from this hospitalization (including imaging, microbiology, ancillary and laboratory) are listed below for reference.     Microbiology: Recent Results (from the past 240 hour(s))  Resp Panel by RT-PCR (Flu A&B, Covid) Nasopharyngeal Swab     Status: None   Collection Time: 12/21/20  4:32 PM   Specimen: Nasopharyngeal Swab; Nasopharyngeal(NP) swabs in vial transport medium  Result Value Ref Range Status   SARS Coronavirus 2 by RT PCR NEGATIVE NEGATIVE Final    Comment: (NOTE) SARS-CoV-2 target nucleic acids are NOT DETECTED.  The SARS-CoV-2 RNA is generally detectable in upper respiratory specimens during the acute phase of infection. The lowest concentration of SARS-CoV-2 viral copies this assay can detect is 138  copies/mL. A negative result does not preclude SARS-Cov-2 infection and should not be used as the sole basis for treatment or other patient management decisions. A negative result may occur with  improper specimen collection/handling, submission of specimen other than nasopharyngeal swab, presence of viral mutation(s) within the areas targeted by this assay, and inadequate number of viral copies(<138 copies/mL). A negative result must be combined with clinical observations, patient history, and epidemiological information. The expected result is Negative.  Fact Sheet for Patients:  EntrepreneurPulse.com.au  Fact Sheet for Healthcare Providers:  IncredibleEmployment.be  This test is no t yet approved or cleared by the [place] FDA and  has been authorized for detection and/or diagnosis of SARS-CoV-2 by FDA under an Emergency Use Authorization (EUA). This EUA will remain  in effect (meaning this test can be used) for the duration of the COVID-19 declaration under Section 564(b)(1) of the Act, 21 U.S.C.section 360bbb-3(b)(1), unless the authorization is terminated  or revoked sooner.       Influenza A by PCR NEGATIVE NEGATIVE Final   Influenza B by PCR NEGATIVE NEGATIVE Final    Comment: (  NOTE) The Xpert Xpress SARS-CoV-2/FLU/RSV plus assay is intended as an aid in the diagnosis of influenza from Nasopharyngeal swab specimens and should not be used as a sole basis for treatment. Nasal washings and aspirates are unacceptable for Xpert Xpress SARS-CoV-2/FLU/RSV testing.  Fact Sheet for Patients: EntrepreneurPulse.com.au  Fact Sheet for Healthcare Providers: IncredibleEmployment.be  This test is not yet approved or cleared by the [place] FDA and has been authorized for detection and/or diagnosis of SARS-CoV-2 by FDA under an Emergency Use Authorization (EUA). This EUA will remain in effect (meaning  this test can be used) for the duration of the COVID-19 declaration under Section 564(b)(1) of the Act, 21 U.S.C. section 360bbb-3(b)(1), unless the authorization is terminated or revoked.  Performed at [facility], [place] [address] [place], [place] [address]      Labs: BNP (last 3 results) No results for input(s): BNP in the last 8760 hours. Basic Metabolic Panel: Recent Labs  Lab 12/21/20 1546 12/21/20 1548  NA 139 139  K 4.1 4.0  CL 107 106  CO2 24  --   GLUCOSE 127* 119*  BUN 13 14  CREATININE 1.15 1.20  CALCIUM 9.3  --    Liver Function Tests: Recent Labs  Lab 12/21/20 1546  AST 19  ALT 17  ALKPHOS 54  BILITOT 0.7  PROT 6.6  ALBUMIN 3.7   No results for input(s): LIPASE, AMYLASE in the last 168 hours. No results for input(s): AMMONIA in the last 168 hours. CBC: Recent Labs  Lab 12/21/20 1546 12/21/20 1548  WBC 11.2*  --   NEUTROABS 9.6*  --   HGB 13.3 13.9  HCT 40.2 41.0  MCV 99.5  --   PLT 266  --    Cardiac Enzymes: No results for input(s): CKTOTAL, CKMB, CKMBINDEX, TROPONINI in the last 168 hours. BNP: Invalid input(s): POCBNP CBG: Recent Labs  Lab 12/21/20 1542  GLUCAP 125*   D-Dimer No results for input(s): DDIMER in the last 72 hours. Hgb A1c No results for input(s): HGBA1C in the last 72 hours. Lipid Profile No results for input(s): CHOL, HDL, LDLCALC, TRIG, CHOLHDL, LDLDIRECT in the last 72 hours. Thyroid function studies No results for input(s): TSH, T4TOTAL, T3FREE, THYROIDAB in the last 72 hours.  Invalid input(s): FREET3 Anemia work up No results for input(s): VITAMINB12, FOLATE, FERRITIN, TIBC, IRON, RETICCTPCT in the last 72 hours. Urinalysis    Component Value Date/Time   COLORURINE YELLOW 12/21/2020 2039   APPEARANCEUR HAZY (A) 12/21/2020 2039   LABSPEC >1.046 (H) 12/21/2020 2039   PHURINE 5.0 12/21/2020 2039   GLUCOSEU NEGATIVE 12/21/2020 2039   HGBUR SMALL (A) 12/21/2020 2039   BILIRUBINUR NEGATIVE  12/21/2020 2039   KETONESUR NEGATIVE 12/21/2020 2039   PROTEINUR NEGATIVE 12/21/2020 2039   NITRITE NEGATIVE 12/21/2020 2039   LEUKOCYTESUR LARGE (A) 12/21/2020 2039   Sepsis Labs Invalid input(s): PROCALCITONIN,  WBC,  LACTICIDVEN Microbiology Recent Results (from the past 240 hour(s))  Resp Panel by RT-PCR (Flu A&B, Covid) Nasopharyngeal Swab     Status: None   Collection Time: 12/21/20  4:32 PM   Specimen: Nasopharyngeal Swab; Nasopharyngeal(NP) swabs in vial transport medium  Result Value Ref Range Status   SARS Coronavirus 2 by RT PCR NEGATIVE NEGATIVE Final    Comment: (NOTE) SARS-CoV-2 target nucleic acids are NOT DETECTED.  The SARS-CoV-2 RNA is generally detectable in upper respiratory specimens during the acute phase of infection. The lowest concentration of SARS-CoV-2 viral copies this assay can detect is  138 copies/mL. A negative result does not preclude SARS-Cov-2 infection and should not be used as the sole basis for treatment or other patient management decisions. A negative result may occur with  improper specimen collection/handling, submission of specimen other than nasopharyngeal swab, presence of viral mutation(s) within the areas targeted by this assay, and inadequate number of viral copies(<138 copies/mL). A negative result must be combined with clinical observations, patient history, and epidemiological information. The expected result is Negative.  Fact Sheet for Patients:  EntrepreneurPulse.com.au  Fact Sheet for Healthcare Providers:  IncredibleEmployment.be  This test is no t yet approved or cleared by the [place] FDA and  has been authorized for detection and/or diagnosis of SARS-CoV-2 by FDA under an Emergency Use Authorization (EUA). This EUA will remain  in effect (meaning this test can be used) for the duration of the COVID-19 declaration under Section 564(b)(1) of the Act, 21 U.S.C.section  360bbb-3(b)(1), unless the authorization is terminated  or revoked sooner.       Influenza A by PCR NEGATIVE NEGATIVE Final   Influenza B by PCR NEGATIVE NEGATIVE Final    Comment: (NOTE) The Xpert Xpress SARS-CoV-2/FLU/RSV plus assay is intended as an aid in the diagnosis of influenza from Nasopharyngeal swab specimens and should not be used as a sole basis for treatment. Nasal washings and aspirates are unacceptable for Xpert Xpress SARS-CoV-2/FLU/RSV testing.  Fact Sheet for Patients: EntrepreneurPulse.com.au  Fact Sheet for Healthcare Providers: IncredibleEmployment.be  This test is not yet approved or cleared by the [place] FDA and has been authorized for detection and/or diagnosis of SARS-CoV-2 by FDA under an Emergency Use Authorization (EUA). This EUA will remain in effect (meaning this test can be used) for the duration of the COVID-19 declaration under Section 564(b)(1) of the Act, 21 U.S.C. section 360bbb-3(b)(1), unless the authorization is terminated or revoked.  Performed at [facility], [place] [address] [place], [place] [address]    Time spent: 35mn  SIGNED:   SMarylu Lund MD  [facility] 12/22/2020, 12:09 PM  If 7PM-7AM, please contact night-coverage

## 2020-12-22 NOTE — Plan of Care (Signed)
  Problem: Education: Goal: Knowledge of General Education information will improve Description: Including pain rating scale, medication(s)/side effects and non-pharmacologic comfort measures Outcome: Adequate for Discharge   Problem: Health Behavior/Discharge Planning: Goal: Ability to manage health-related needs will improve Outcome: Adequate for Discharge   Problem: Clinical Measurements: Goal: Ability to maintain clinical measurements within normal limits will improve Outcome: Adequate for Discharge Goal: Will remain free from infection Outcome: Adequate for Discharge Goal: Diagnostic test results will improve Outcome: Adequate for Discharge Goal: Respiratory complications will improve Outcome: Adequate for Discharge Goal: Cardiovascular complication will be avoided Outcome: Adequate for Discharge   Problem: Activity: Goal: Risk for activity intolerance will decrease Outcome: Adequate for Discharge   Problem: Nutrition: Goal: Adequate nutrition will be maintained Outcome: Adequate for Discharge   Problem: Coping: Goal: Level of anxiety will decrease Outcome: Adequate for Discharge   Problem: Elimination: Goal: Will not experience complications related to bowel motility Outcome: Adequate for Discharge Goal: Will not experience complications related to urinary retention Outcome: Adequate for Discharge   Problem: Pain Managment: Goal: General experience of comfort will improve Outcome: Adequate for Discharge   Problem: Safety: Goal: Ability to remain free from injury will improve Outcome: Adequate for Discharge   Problem: Skin Integrity: Goal: Risk for impaired skin integrity will decrease Outcome: Adequate for Discharge   Problem: Education: Goal: Knowledge of disease or condition will improve Outcome: Adequate for Discharge Goal: Knowledge of secondary prevention will improve Outcome: Adequate for Discharge Goal: Knowledge of patient specific risk factors  addressed and post discharge goals established will improve Outcome: Adequate for Discharge Goal: Individualized Educational Video(s) Outcome: Adequate for Discharge   Problem: Coping: Goal: Will verbalize positive feelings about self Outcome: Adequate for Discharge Goal: Will identify appropriate support needs Outcome: Adequate for Discharge   Problem: Health Behavior/Discharge Planning: Goal: Ability to manage health-related needs will improve Outcome: Adequate for Discharge   Problem: Self-Care: Goal: Ability to participate in self-care as condition permits will improve Outcome: Adequate for Discharge Goal: Verbalization of feelings and concerns over difficulty with self-care will improve Outcome: Adequate for Discharge Goal: Ability to communicate needs accurately will improve Outcome: Adequate for Discharge   Problem: Nutrition: Goal: Risk of aspiration will decrease Outcome: Adequate for Discharge Goal: Dietary intake will improve Outcome: Adequate for Discharge   Problem: Ischemic Stroke/TIA Tissue Perfusion: Goal: Complications of ischemic stroke/TIA will be minimized Outcome: Adequate for Discharge   Problem: Acute Rehab PT Goals(only PT should resolve) Goal: Patient Will Transfer Sit To/From Stand Outcome: Adequate for Discharge Goal: Pt Will Transfer Bed To Chair/Chair To Bed Outcome: Adequate for Discharge Goal: Pt Will Ambulate Outcome: Adequate for Discharge Goal: Pt Will Go Up/Down Stairs Outcome: Adequate for Discharge

## 2020-12-22 NOTE — Progress Notes (Signed)
STROKE TEAM PROGRESS NOTE   INTERVAL HISTORY Patient states he had a headache followed by some tingling which started in the left hand and little while later involve the left upper extremity.  He also complained of some blurred vision and shining lights which lasted longer than previous episodes.  All symptoms have resolved.  MRI scan is negative for acute stroke.  MRI cervical spine also shows no significant compression.  CT angiogram of the brain and neck showed no significant large vessel stenosis or occlusion.  Transthoracic echo is unremarkable.  Hemoglobin A1c is 5.9 and LDL cholesterol is 127 mg percent.  He denies any prior history of migraines but does state that occasionally he does have some blurred vision and shining lights but they are usually transient.  Vitals:   12/22/20 0245 12/22/20 0445 12/22/20 0728 12/22/20 1114  BP:  125/67 133/69 (!) 144/63  Pulse: (!) 51 (!) 52 68 63  Resp:  18 (!) 23 (!) 22  Temp: 98.4 F (36.9 C) 98.5 F (36.9 C) 98 F (36.7 C) 97.9 F (36.6 C)  TempSrc: Oral Oral Oral Oral  SpO2:  95% 95% 96%  Weight:      Height:       CBC:  Recent Labs  Lab 12/21/20 1546 12/21/20 1548  WBC 11.2*  --   NEUTROABS 9.6*  --   HGB 13.3 13.9  HCT 40.2 41.0  MCV 99.5  --   PLT 266  --    Basic Metabolic Panel:  Recent Labs  Lab 12/21/20 1546 12/21/20 1548  NA 139 139  K 4.1 4.0  CL 107 106  CO2 24  --   GLUCOSE 127* 119*  BUN 13 14  CREATININE 1.15 1.20  CALCIUM 9.3  --    Lipid Panel: LDL 127 HgbA1c: 5.9 Urine Drug Screen: No results for input(s): LABOPIA, COCAINSCRNUR, LABBENZ, AMPHETMU, THCU, LABBARB in the last 168 hours.  Alcohol Level No results for input(s): ETH in the last 168 hours.  IMAGING past 24 hours MR BRAIN WO CONTRAST  Result Date: 12/21/2020 IMPRESSION:  Negative for acute infarct. Atrophy and chronic microvascular ischemic changes in the white matter.   MR CERVICAL SPINE WO CONTRAST  Result Date:  12/21/2020 IMPRESSION:  1. No acute abnormality within the cervical spine. Normal MRI appearance of the cervical spinal cord with no evidence for myelopathy.  2. Multilevel cervical spondylosis with resultant mild to moderate spinal stenosis at C3-4 and C5-6. 3. Multifactorial degenerative changes with resultant multilevel foraminal narrowing as above. Notable findings include severe right C4 foraminal stenosis, severe left C5 foraminal narrowing, severe left with moderate right C6 and C7 foraminal stenosis.   ECHOCARDIOGRAM COMPLETE  Result Date: 12/22/2020 IMPRESSIONS   1. Left ventricular ejection fraction, by estimation, is 60 to 65%. The left ventricle has normal function. The left ventricle has no regional wall motion abnormalities. Left ventricular diastolic parameters were normal.   2. Right ventricular systolic function is normal. The right ventricular size is normal. There is mildly elevated pulmonary artery systolic pressure.   3. The mitral valve is normal in structure. No evidence of mitral valve regurgitation. No evidence of mitral stenosis.   4. The aortic valve is tricuspid. Aortic valve regurgitation is mild. Mild aortic valve sclerosis is present, with no evidence of aortic valve stenosis.   5. The inferior vena cava is normal in size with greater than 50% respiratory variability, suggesting right atrial pressure of 3 mmHg.   CT HEAD CODE STROKE WO  CONTRAST Result Date: 12/21/2020 IMPRESSION:  1. No acute abnormality  2. ASPECTS is 10  3. Progression of atrophy and chronic microvascular ischemic change since 2017   CT ANGIO HEAD NECK W WO CM (CODE STROKE) Result Date: 12/21/2020 IMPRESSION:  1. Negative for intracranial large vessel occlusion  2. Mild stenosis of the M1/ M2 segment bilaterally. Posterior circulation widely patent.  3. Mild atherosclerotic disease in the carotid bifurcation. No significant carotid or vertebral artery stenosis in the neck.   PHYSICAL  EXAM Pleasant elderly Caucasian male not in distress. . Afebrile. Head is nontraumatic. Neck is supple without bruit.    Cardiac exam no murmur or gallop. Lungs are clear to auscultation. Distal pulses are well felt.  Neurological Exam ;  Awake  Alert oriented x 3. Normal speech and language.eye movements full without nystagmus.fundi were not visualized. Vision acuity and fields appear normal. Hearing is normal. Palatal movements are normal. Face symmetric. Tongue midline. Normal strength, tone, reflexes and coordination. Normal sensation. Gait deferred.  ASSESSMENT/PLAN Leslie Cardenas is a 80 y.o. male with history of aortic atherosclerosis, CAD, grade I diastolic dysfunction, MI in 1983, hyperlipidemia, essential hypertension, pre-diabetes, chronic kidney disease stage 3b, traumatic amputation of the right leg, and COPD who presented to the ED for evaluation of headache, blurred vision, and left hand numbness.  TIA vs complicated Migraine Code Stroke CT head No acute abnormality.ASPECTS 10.    CTA head & neck: Mild stenosis of the M1/ M2 segment bilaterall MRI  Negative for acute infarct 2D Echo EF 60-65% LDL 127 HgbA1c 5.9 VTE prophylaxis - Lovenox    Diet   DIET SOFT Room service appropriate? Yes; Fluid consistency: Thin   No antithrombotic prior to admission, now on aspirin 81 mg daily.  Recommend Aspirin 81mg  daily Therapy recommendations:  PT: home health, 24hr supervision Disposition:  likely home today  Hypertension Home meds:  none Stable Permissive hypertension (OK if < 220/120) but gradually normalize in 5-7 days Long-term BP goal normotensive  Hyperlipidemia Home meds:  Crestor 20mg  daily, zetia 10mg  daily LDL 127, goal < 70 Increase crestor to 40mg  daily Continue statin at discharge  Diabetes type II Controlled Home meds:  none HgbA1c 5.9, goal < 7.0 CBGs Recent Labs    12/21/20 1542  GLUCAP 125*    SSI  Other Stroke Risk Factors Advanced Age >/= 73   Former Cigarette smoker quit 20 years ago Coronary artery disease: MI in 50 Migraines  Other Active Problems [facility] day # 0  Leslie Cardenas, ACNP-BC Stroke NP I have personally obtained history,examined this patient, reviewed notes, independently viewed imaging studies, participated in medical decision making and plan of care.ROS completed by me personally and pertinent positives fully documented  I have made any additions or clarifications directly to the above note. Agree with note above.  He presented with transient episode of headache as well as some blurred vision and paresthesias in his left hand possibly a complicated migraine episode rather than a TIA.  Stroke restratification work-up was completed and essentially unremarkable.  Recommend aspirin 81 mg daily.  The episodes recur more frequent may consider prophylaxis with calcium channel blockers.  Follow-up electively as an outpatient in the stroke clinic in 6 to 8 weeks.  Greater than 50% time during this 35-minute visit was spent on counseling and coordination of care and discussion with care team.  Discussed with Dr. Wyline Copas.  Antony Contras, MD Medical Director [place] Pager: [phone] 12/22/2020 4:18  PM  To contact Stroke Continuity provider, please refer to [email or web address] After hours, contact General Neurology

## 2020-12-22 NOTE — Progress Notes (Signed)
  Echocardiogram 2D Echocardiogram has been performed.  Leslie Cardenas 12/22/2020, 10:18 AM

## 2020-12-22 NOTE — Evaluation (Signed)
Physical Therapy Evaluation Patient Details Name: Leslie Cardenas MRN: [id number] DOB: 1941/05/16 Today's Date: 12/22/2020   History of Present Illness  Pt is an 79 y/o male admitted secondary to blurry vision and a headache. MRI of the brain and C-spine were both negative for any acute findings. 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   Clinical Impression  Pt presented supine in bed with HOB elevated, awake and willing to participate in therapy session. Prior to admission, pt reported that he was independent with all functional mobility and ADLs. Pt lives alone in a single level home with five steps to enter. His son and daughter-in-law can be with him until the beginning of August when they are moving to [place], [place]. He also stated that he has a lot of good neighbors for support. At the time of evaluation, pt able to perform bed mobility at a mod I level, transfers with supervision and ambulated a short distance in the hallway with min guard without use of an AD. He was able to sit up at EOB and donned his bilateral shoes and his R LE prosthesis independently. Pt would continue to benefit from skilled physical therapy services at this time while admitted and after d/c to address the below listed limitations in order to improve overall safety and independence with functional mobility.     Follow Up Recommendations Home health PT;Supervision/Assistance - 24 hour;Other (comment) (24/7 support initially until fully returned to baseline)    Equipment Recommendations  None recommended by PT    Recommendations for Other Services       Precautions / Restrictions Precautions Precautions: Fall Precaution Comments: prior R BKA, prosthesis in room Restrictions Weight Bearing Restrictions: No      Mobility  Bed Mobility Overal bed mobility: Modified  Independent                  Transfers Overall transfer level: Needs assistance Equipment used: None Transfers: Sit to/from Stand Sit to Stand: Supervision         General transfer comment: pt able to stand from EOB with close supervision for safety, no reports of dizziness or any other symptoms, mildly unsteady but no LOB or need for external support  Ambulation/Gait Ambulation/Gait assistance: Min guard Gait Distance (Feet): 60 Feet Assistive device: None Gait Pattern/deviations: Step-through pattern;Decreased step length - right;Decreased step length - left;Decreased stride length;Narrow base of support Gait velocity: decreased   General Gait Details: pt with a slow, guarded and mildly unsteady gait with very short step lengths bilaterally. He did not require an AD or external support; however, PT providing close min guard for safety  Stairs            Wheelchair Mobility    Modified Rankin (Stroke Patients Only) Modified Rankin (Stroke Patients Only) Pre-Morbid Rankin Score: No symptoms Modified Rankin: Slight disability     Balance Overall balance assessment: Needs assistance Sitting-balance support: Feet supported Sitting balance-Leahy Scale: Good Sitting balance - Comments: pt able to sit EOB and don bilateral shoes and his R LE prosthesis independently   Standing balance support: No upper extremity supported Standing balance-Leahy Scale: Fair                               Pertinent Vitals/Pain Pain Assessment: No/denies pain    Home  Living Family/patient expects to be discharged to:: Private residence Living Arrangements: Alone Available Help at Discharge: Family;Available PRN/intermittently Type of Home: House Home Access: Stairs to enter Entrance Stairs-Rails: Psychiatric nurse of Steps: 5 Home Layout: One level Home Equipment: Clinical cytogeneticist - 2 wheels;Cane - single point;Crutches      Prior Function  Level of Independence: Independent         Comments: works at the [employer] (2 weekends on, 2 weekends off)     Hand Dominance        Extremity/Trunk Assessment   Upper Extremity Assessment Upper Extremity Assessment: Defer to OT evaluation;Overall [facility] for tasks assessed    Lower Extremity Assessment Lower Extremity Assessment: Overall WFL for tasks assessed;RLE deficits/detail RLE Deficits / Details: R residual limb was clean and intact       Communication   Communication: No difficulties  Cognition Arousal/Alertness: Awake/alert Behavior During Therapy: WFL for tasks assessed/performed Overall Cognitive Status: Within Functional Limits for tasks assessed                                        General Comments      Exercises     Assessment/Plan    PT Assessment Patient needs continued PT services  PT Problem List Decreased strength;Decreased balance;Decreased mobility;Decreased coordination       PT Treatment Interventions DME instruction;Gait training;Stair training;Functional mobility training;Therapeutic activities;Therapeutic exercise;Balance training;Neuromuscular re-education;Patient/family education    PT Goals (Current goals can be found in the Care Plan section)  Acute Rehab PT Goals Patient Stated Goal: "go home" PT Goal Formulation: With patient Time For Goal Achievement: 01/05/21 Potential to Achieve Goals: Good    Frequency Min 3X/week   Barriers to discharge        Co-evaluation               AM-PAC PT "6 Clicks" Mobility  Outcome Measure Help needed turning from your back to your side while in a flat bed without using bedrails?: None Help needed moving from lying on your back to sitting on the side of a flat bed without using bedrails?: None Help needed moving to and from a bed to a chair (including a wheelchair)?: None Help needed standing up from a chair using your arms (e.g., wheelchair or bedside chair)?:  None Help needed to walk in hospital room?: A Little Help needed climbing 3-5 steps with a railing? : A Lot 6 Click Score: 21    End of Session Equipment Utilized During Treatment: Gait belt Activity Tolerance: Patient tolerated treatment well Patient left: in bed;with call bell/phone within reach;with bed alarm set;Other (comment) (sitting upright at EOB) Nurse Communication: Mobility status PT Visit Diagnosis: Other abnormalities of gait and mobility (R26.89)    Time: 0981-1914 PT Time Calculation (min) (ACUTE ONLY): 17 min   Charges:   PT Evaluation $PT Eval Low Complexity: 1 Low          Anastasio Champion, DPT  Acute Rehabilitation Services Office [facility] 12/22/2020, 9:38 AM

## 2020-12-22 NOTE — Evaluation (Signed)
Occupational Therapy Evaluation and Discharge Patient Details Name: Leslie Cardenas MRN: [id number] DOB: [birth year] Today's Date: 12/22/2020    History of Present Illness Pt is an 80 y/o male admitted secondary to blurry vision and a headache. MRI of the brain and C-spine were both negative for any acute findings. 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   Clinical Impression   This 80 yo male admitted with above presents to acute OT at a Mod I level with prothesis on for all basic ADLs and mobility in hospital room. No further OT needs we will sign off.    Follow Up Recommendations  No OT follow up    Equipment Recommendations  None recommended by OT       Precautions / Restrictions Precautions Precautions: Fall Precaution Comments: prior R BKA, prosthesis in room Restrictions Weight Bearing Restrictions: No      Mobility Bed Mobility Overal bed mobility: Independent                  Transfers Overall transfer level: Modified independent               General transfer comment: RLE prothesis    Balance Overall balance assessment: Mild deficits observed, not formally tested                                         ADL either performed or assessed with clinical judgement   ADL Overall ADL's : Modified independent                                             Vision Patient Visual Report: No change from baseline              Pertinent Vitals/Pain Pain Assessment: No/denies pain     Hand Dominance Right   Extremity/Trunk Assessment Upper Extremity Assessment Upper Extremity Assessment: Overall WFL for tasks assessed           Communication Communication Communication: No difficulties   Cognition Arousal/Alertness: Awake/alert Behavior During Therapy: WFL for tasks  assessed/performed Overall Cognitive Status: Within Functional Limits for tasks assessed                                                Home Living Family/patient expects to be discharged to:: Private residence   Available Help at Discharge: Family;Available PRN/intermittently Type of Home: House Home Access: Stairs to enter [employer] of Steps: 5 Entrance Stairs-Rails: Right;Left Home Layout: One level     Bathroom Shower/Tub: Walk-in shower;Tub/shower unit         Home Equipment: Clinical cytogeneticist - 2 wheels;Cane - single point;Crutches          Prior Functioning/Environment Level of Independence: Independent        Comments: works at the [employer] (2 weekends on, 2 weekends off)                 OT Goals(Current goals can be found in the care plan section) Acute Rehab OT Goals Patient  Stated Goal: go home today   AM-PAC OT "6 Clicks" Daily Activity     Outcome Measure Help from another person eating meals?: None Help from another person taking care of personal grooming?: None Help from another person toileting, which includes using toliet, bedpan, or urinal?: None Help from another person bathing (including washing, rinsing, drying)?: None Help from another person to put on and taking off regular upper body clothing?: None Help from another person to put on and taking off regular lower body clothing?: None 6 Click Score: 24   End of Session Equipment Utilized During Treatment:  (none)  Activity Tolerance: Patient tolerated treatment well Patient left: in chair;with call bell/phone within reach  OT Visit Diagnosis: Unsteadiness on feet (R26.81)                Time: 4270-6237 OT Time Calculation (min): 13 min Charges:  OT General Charges $OT Visit: 1 Visit OT Evaluation $OT Eval Low Complexity: 1 Low [place] [address], OTR/L Acute [employer] Pager [phone] Office [phone]   Almon Register 12/22/2020, 1:23 PM

## 2020-12-30 ENCOUNTER — Telehealth: Payer: Self-pay

## 2020-12-30 NOTE — Telephone Encounter (Signed)
Left message on voicemail to call back regarding his recent Hospitalization.

## 2021-01-01 ENCOUNTER — Telehealth: Payer: Self-pay

## 2021-01-01 NOTE — Telephone Encounter (Signed)
Called patient on 01/01/2021 , 9:43 AM in an attempt to reach the patient for a hospital follow up.   Admit date: 12/21/20 Discharge: 12/22/20   He does not have any questions or concerns about medications from the hospital admission. The patient's medications were reviewed over the phone, they were counseled to bring in all current medications to the hospital follow up visit.   I advised the patient to call if any questions or concerns arise about the hospital admission or medications    Home health was not started in the hospital.  All questions were answered and a follow up appointment was made.   Prior to Admission medications   Medication Sig Start Date End Date Taking? Authorizing Provider  albuterol (PROVENTIL) (2.5 MG/3ML) 0.083% nebulizer solution Inhale 1 vial by Nebulizer 4 x /day or every 4 hr to rescue Asthma 04/07/19   Unk Pinto, MD  albuterol (VENTOLIN HFA) 108 (90 Base) MCG/ACT inhaler Inhale 2 puffs into the lungs every 4 (four) hours as needed for wheezing or shortness of breath. Make sure you rinse mouth out after each use. 08/29/19   Garnet Sierras, NP  aspirin EC 81 MG EC tablet Take 1 tablet (81 mg total) by mouth daily. Swallow whole. 12/23/20 01/22/21  Donne Hazel, MD  calcium carbonate (CALCIUM 600) 600 MG TABS tablet Take 1 tablet (600 mg total) by mouth 2 (two) times daily with a meal. 07/29/18   McClanahan, Kyra, NP  Ensure (ENSURE) Take 237 mLs by mouth. Drinks one with his meals----per patient-09/04/2020    [provider]  ezetimibe (ZETIA) 10 MG tablet Take 1 tablet (10 mg total) by mouth daily. 09/05/20 09/05/21  Garnet Sierras, NP  lidocaine (XYLOCAINE) 2 % solution Use as directed 15 mLs in the mouth or throat as needed for mouth pain. 09/07/19   Vladimir Crofts, PA-C  loratadine (CLARITIN) 10 MG tablet TAKE 1 TABLET BY MOUTH DAILY FOR ALLERGIES 09/04/20   Garnet Sierras, NP  mirtazapine (REMERON) 15 MG tablet Take one tablet by mouth at bedtime  for weight. 09/04/20   Garnet Sierras, NP  nitroGLYCERIN (NITROSTAT) 0.4 MG SL tablet Place 1 tablet (0.4 mg total) under the tongue every 5 (five) minutes x 3 doses as needed for chest pain. 11/14/11   Dunn, Nedra Hai, PA-C  olmesartan-hydrochlorothiazide (BENICAR HCT) 20-12.5 MG tablet TAKE 1 TABLET DAILY FOR BLOOD PRESSURE 11/26/19   Unk Pinto, MD  omeprazole (PRILOSEC) 40 MG capsule TAKE 1 CAPSULE DAILY TO PREVENT HEARTBURN & INDIGESTION 06/26/20   Liane Comber, NP  oxybutynin (DITROPAN-XL) 10 MG 24 hr tablet TAKE 1 TABLET DAILY FOR BLADDER CONTROL 10/12/20   Liane Comber, NP  predniSONE (DELTASONE) 5 MG tablet TAKE 1 TABLET BY MOUTH TWICE A DAY AS DIRECTED FOR COPD 11/27/20   Unk Pinto, MD  Respiratory Therapy Supplies (NEBULIZER/TUBING/MOUTHPIECE) KIT Disp one nebulizer machine, tubing set and mouthpiece kit for use 4 times daily 07/18/20   Unk Pinto, MD  rosuvastatin (CRESTOR) 20 MG tablet TAKE 1 TABLET BY MOUTH EVERY DAY FOR CHOLESTEROL 04/17/20   Liane Comber, NP  tadalafil (CIALIS) 20 MG tablet Take 1/2 to 1 tablet every 2 to 3 days if needed for XXXX 07/10/20   Unk Pinto, MD  tiZANidine (ZANAFLEX) 4 MG tablet TAKE 1 TABLET 3 X /DAY FOR BACK PAIN & MUSCLE SPASMS 08/08/20   Liane Comber, NP  VITAMIN D PO Take 5,000 Units by mouth daily.    [provider]  zinc gluconate 50  MG tablet Take 50 mg by mouth daily. Takes some days.    [provider]

## 2021-01-06 NOTE — Progress Notes (Signed)
Hospital follow up  Assessment and Plan: Hospital visit follow up for TIA:  Leslie Cardenas was seen today for hospitalization follow-up.  Diagnoses and all orders for this visit:  Thoracic aortic atherosclerosis ([place]) by CXR on 07/29/2018       - Continue diet and Crestor, monitor  Essential hypertension       - - continue medications, DASH diet, exercise and monitor at home. Call if greater than 130/80.    Hyperlipidemia, mixed -     CBC with Differential/Platelet -     COMPLETE METABOLIC PANEL WITH GFR -     Lipid panel -     TSH  Chronic obstructive pulmonary disease, unspecified COPD type (HCC)       - Continue inhalers as needed  Stage 3a chronic kidney disease (HCC)       - Push fluids, monitor salt intake and continue with diet and exercise  TIA (transient ischemic attack)       - No neurologic deficits.  Continue BP and cholesterol control  Screening for thyroid disorder -     TSH  Bronchitis -     azithromycin (ZITHROMAX) 250 MG tablet; Take 2 tablets (500 mg) on  Day 1,  followed by 1 tablet (250 mg) once daily on Days 2 through 5. -     dexamethasone (DECADRON) 1 MG tablet; Take 3 tabs for 3 days, 2 tabs for 3 days 1 tab for 5 days. Take with food.   All medications were reviewed with patient and family and fully reconciled. All questions answered fully, and patient and family members were encouraged to call the office with any further questions or concerns. Discussed goal to avoid readmission related to this diagnosis.  Medications Discontinued During This Encounter  Medication Reason   aspirin EC 81 MG EC tablet Patient Preference   calcium carbonate (CALCIUM 600) 600 MG TABS tablet Patient Preference   lidocaine (XYLOCAINE) 2 % solution Completed Course   mirtazapine (REMERON) 15 MG tablet Completed Course   olmesartan-hydrochlorothiazide (BENICAR HCT) 20-12.5 MG tablet Change in therapy   oxybutynin (DITROPAN-XL) 10 MG 24 hr tablet Completed Course   zinc gluconate  50 MG tablet Patient Preference   predniSONE (DELTASONE) 5 MG tablet Completed Course    CAN NOT DO FOR BCBS REGULAR OR MEDICARE Over 40 minutes of exam, counseling, chart review, and complex, high/moderate level critical decision making was performed this visit.   Future Appointments  Date Time Provider [place]  06/24/2021 11:00 AM Unk Pinto, MD [facility] None     HPI 80 y.o.male presents for follow up for transition from recent hospitalization or SNIF stay. Admit date to the hospital was 12/21/20, patient was discharged from the hospital on 12/22/20 and our clinical staff contacted the office the day after discharge to set up a follow up appointment. The discharge summary, medications, and diagnostic test results were reviewed before meeting with the patient. The patient was admitted for:  Principal Problem:   TIA (transient ischemic attack) Active Problems:   Blurred vision   Vision changes/headache/paresthesia -- MRI brain negative - CTA head and neck negative for LVO with mild stenosis of the M1/M2 segment - 2d reviewed with normal LVEF - started daily aspirin and continue statin  -Last A1c -5.9 in June  and lipids with total cholesterol at 213, LDL at 127 -neurology was consulted. Recommendation to continue ASA, OK to d/c per Neurology   Hx of sinus bradycardia -remained stable on tele   COPD -not  in acute exacerbation   CKD stage IIIb -Creatinine stable   Prediabetes - Last hemoglobin A1c of 5.9 on 12/05/2020   Dysphagia -has hx of GERD with stricture. Last dilation on 07/26/2020.  -Recommend follow up with GI for repeat endoscopy   Home health is not involved.   Images while in the hospital: MR BRAIN WO CONTRAST  Result Date: 12/21/2020 CLINICAL DATA:  Acute neuro deficit.  Rule out stroke EXAM: MRI HEAD WITHOUT CONTRAST TECHNIQUE: Multiplanar, multiecho pulse sequences of the brain and surrounding structures were obtained without intravenous  contrast. COMPARISON:  CT angio head and neck 12/21/2020  IMPRESSION: Negative for acute infarct. Atrophy and chronic microvascular ischemic changes in the white matter. Electronically Signed   By: Franchot Gallo M.D.   On: 12/21/2020 17:53   MR CERVICAL SPINE WO CONTRAST  Result Date: 12/21/2020 CLINICAL DATA:  Initial evaluation for myelopathy, acute or progressive. EXAM: MRI CERVICAL SPINE WITHOUT CONTRAST TECHNIQUE: Multiplanar, multisequence MR imaging of the cervical spine was performed. No intravenous contrast was administered. COMPARISON:  Radiograph from 04/22/2016.Marland Kitchen IMPRESSION: 1. No acute abnormality within the cervical spine. Normal MRI appearance of the cervical spinal cord with no evidence for myelopathy. 2. Multilevel cervical spondylosis with resultant mild to moderate spinal stenosis at C3-4 and C5-6. 3. Multifactorial degenerative changes with resultant multilevel foraminal narrowing as above. Notable findings include severe right C4 foraminal stenosis, severe left C5 foraminal narrowing, severe left with moderate right C6 and C7 foraminal stenosis. Electronically Signed   By: Jeannine Boga M.D.   On: 12/21/2020 23:01   ECHOCARDIOGRAM COMPLETE  Result Date: 12/22/2020    ECHOCARDIOGRAM REPORT   Patient Name:   Leslie Cardenas [facility] Date of Exam: 12/22/2020 Medical Rec #:  [id number]       Height:       68.0 in Accession #:    [id number]      Weight:       129.0 lb Date of Birth:  [birth year]        BSA:          1.696 m Patient Age:    89 years        BP:           133/69 mmHg Patient Gender: M               HR:           72 bpm. Exam Location:  Inpatient Procedure: 2D Echo Indications:    stroke  History:        Patient has prior history of Echocardiogram examinations, most                 recent 08/09/2017. COPD; Risk Factors:Hypertension and                 Dyslipidemia.  Sonographer:    Johny Chess Referring Phys: [id number] [place]  Sonographer Comments: Image acquisition  challenging due to respiratory motion. IMPRESSIONS  1. Left ventricular ejection fraction, by estimation, is 60 to 65%. The left ventricle has normal function. The left ventricle has no regional wall motion abnormalities. Left ventricular diastolic parameters were normal.  2. Right ventricular systolic function is normal. The right ventricular size is normal. There is mildly elevated pulmonary artery systolic pressure.  3. The mitral valve is normal in structure. No evidence of mitral valve regurgitation. No evidence of mitral stenosis.  4. The aortic valve is tricuspid. Aortic valve regurgitation is mild. Mild aortic valve sclerosis is  present, with no evidence of aortic valve stenosis.  5. The inferior vena cava is normal in size with greater than 50% respiratory variability, suggesting right atrial pressure of 3 mmHg. Comparison(s): Prior images unable to be directly viewed, comparison made by report only.    CT HEAD CODE STROKE WO CONTRAST  Result Date: 12/21/2020 CLINICAL DATA:  Code stroke. Acute neuro deficit. Headache and blurred vision EXAM: CT HEAD WITHOUT CONTRAST TECHNIQUE: Contiguous axial images were obtained from the base of the skull through the vertex without intravenous contrast. COMPARISON:  CT head 04/22/2016 IMPRESSION: 1. No acute abnormality 2. ASPECTS is 10 3. Progression of atrophy and chronic microvascular ischemic change since 2017 4. Code stroke imaging results were communicated on 12/21/2020 at 3:56 pm to provider Bhagat via text page Electronically Signed   By: Franchot Gallo M.D.   On: 12/21/2020 15:57   CT ANGIO HEAD NECK W WO CM (CODE STROKE)  Result Date: 12/21/2020 CLINICAL DATA:  Acute neuro deficit.  Headache and blurred vision EXAM: CT ANGIOGRAPHY HEAD AND NECK TECHNIQUE: Multidetector CT imaging of the head and neck was performed using the standard protocol during bolus administration of intravenous contrast. Multiplanar CT image reconstructions and MIPs were obtained to  evaluate the vascular anatomy. Carotid stenosis measurements (when applicable) are obtained utilizing NASCET criteria, using the distal internal carotid diameter as the denominator. CONTRAST:  138m OMNIPAQUE IOHEXOL 350 MG/ML SOLN COMPARISON:  CT head 12/21/2020 IMPRESSION: 1. Negative for intracranial large vessel occlusion 2. Mild stenosis of the M1/ M2 segment bilaterally. Posterior circulation widely patent. 3. Mild atherosclerotic disease in the carotid bifurcation. No significant carotid or vertebral artery stenosis in the neck. Electronically Signed   By: CFranchot GalloM.D.   On: 12/21/2020 16:24     Current Outpatient Medications (Endocrine & Metabolic):    dexamethasone (DECADRON) 1 MG tablet, Take 3 tabs for 3 days, 2 tabs for 3 days 1 tab for 5 days. Take with food.  Current Outpatient Medications (Cardiovascular):    nitroGLYCERIN (NITROSTAT) 0.4 MG SL tablet, Place 1 tablet (0.4 mg total) under the tongue every 5 (five) minutes x 3 doses as needed for chest pain.   rosuvastatin (CRESTOR) 20 MG tablet, TAKE 1 TABLET BY MOUTH EVERY DAY FOR CHOLESTEROL   tadalafil (CIALIS) 20 MG tablet, Take 1/2 to 1 tablet every 2 to 3 days if needed for XXXX   ezetimibe (ZETIA) 10 MG tablet, Take 1 tablet (10 mg total) by mouth daily.  Current Outpatient Medications (Respiratory):    albuterol (PROVENTIL) (2.5 MG/3ML) 0.083% nebulizer solution, Inhale 1 vial by Nebulizer 4 x /day or every 4 hr to rescue Asthma   albuterol (VENTOLIN HFA) 108 (90 Base) MCG/ACT inhaler, Inhale 2 puffs into the lungs every 4 (four) hours as needed for wheezing or shortness of breath. Make sure you rinse mouth out after each use.   loratadine (CLARITIN) 10 MG tablet, TAKE 1 TABLET BY MOUTH DAILY FOR ALLERGIES  Current Outpatient Medications (Analgesics):    acetaminophen (TYLENOL) 325 MG tablet, Take 325 mg by mouth every 6 (six) hours as needed.   Current Outpatient Medications (Other):    azithromycin (ZITHROMAX)  250 MG tablet, Take 2 tablets (500 mg) on  Day 1,  followed by 1 tablet (250 mg) once daily on Days 2 through 5.   Ensure (ENSURE), Take 237 mLs by mouth. Drinks one with his meals----per patient-09/04/2020   Multiple Vitamins-Minerals (MULTIVITAMIN WITH MINERALS) tablet, Take 1 tablet by mouth daily.  omeprazole (PRILOSEC) 40 MG capsule, TAKE 1 CAPSULE DAILY TO PREVENT HEARTBURN & INDIGESTION   Respiratory Therapy Supplies (NEBULIZER/TUBING/MOUTHPIECE) KIT, Disp one nebulizer machine, tubing set and mouthpiece kit for use 4 times daily   tiZANidine (ZANAFLEX) 4 MG tablet, TAKE 1 TABLET 3 X /DAY FOR BACK PAIN & MUSCLE SPASMS   VITAMIN D PO, Take 5,000 Units by mouth daily.  Past Medical History:  Diagnosis Date   Aortic atherosclerosis ([place]) 03/04/2018   Noted on CT Abd/pelvis   Cataract    Bilateral   Colon polyp    COPD (chronic obstructive pulmonary disease) ([place])    Coronary artery disease    Diverticulosis 07/16/2016   SIGMOID, NOTED ON COLONOSCOPY   Esophageal stenosis 02/24/2018   noted on endoscopy   Esophageal stricture    GERD (gastroesophageal reflux disease)    Grade I diastolic dysfunction 53/97/6734   noted on ECHO   History of hiatal hernia 02/24/2018   Small noted on endoscopy   Hydronephrosis    Hydronephrosis of right kidney 03/04/2018   URETERAL STONE 7 MM, NOTED ONJ CT ABD/PELVIS   Hyperlipidemia    Myocardial infarction [facility])    [id number]   Nephrolithiasis    s/p stent placement   Skin cancer    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 NOTED ON CT ABD/PELVIS   Vitamin D deficiency    Wears dentures    upper     No Known Allergies  Review of Systems  Constitutional:  Negative for chills, fever and weight loss.  HENT:  Negative for congestion and hearing loss.   Eyes:  Negative for blurred vision and double vision.  Respiratory:  Positive for cough, sputum production (yellow) and wheezing. Negative  for shortness of breath.   Cardiovascular:  Negative for chest pain, palpitations, orthopnea and leg swelling.  Gastrointestinal:  Negative for abdominal pain, constipation, diarrhea, heartburn, nausea and vomiting.  Musculoskeletal:  Negative for falls, joint pain and myalgias.  Skin:  Negative for rash.       Foreign body noted in thumb of left hand  Neurological:  Negative for dizziness, tingling, tremors, loss of consciousness and headaches.  Endo/Heme/Allergies:  Bruises/bleeds easily.  Psychiatric/Behavioral:  Negative for depression, memory loss and suicidal ideas.     Physical Exam: Filed Weights   01/09/21 1353  Weight: 130 lb 3.2 oz (59.1 kg)   BP (!) 110/52   Pulse 81   Temp 97.7 F (36.5 C)   Wt 130 lb 3.2 oz (59.1 kg)   SpO2 97%   BMI 19.80 kg/m  General Appearance: Well nourished, in no apparent distress. Eyes: PERRLA, EOMs, conjunctiva no swelling or erythema Sinuses: No Frontal/maxillary tenderness ENT/Mouth: Ext aud canals clear, TMs without erythema, bulging. No erythema, swelling, or exudate on post pharynx.  Tonsils not swollen or erythematous. Hearing normal.  Neck: Supple, thyroid normal.  Respiratory: Expiratory wheezes, some crackles in bases bilaterally Cardio: RRR with no MRGs. Brisk peripheral pulses without edema.  Abdomen: Soft, + BS.  Non tender, no guarding, rebound, hernias, masses. Lymphatics: Non tender without lymphadenopathy.  Musculoskeletal: Full ROM, 5/5 strength, normal gait.  Skin: Warm, dry without rashes, lesions, ecchymosis. 1cm foreign body at base of left thumb, removed with tweezers, triple antibiotic ointment and bandaid applied. Neuro: Cranial nerves intact. Normal muscle tone, no cerebellar symptoms. Sensation intact.  Psych: Awake and oriented X 3, normal affect, Insight and Judgment appropriate.  Magda Bernheim, NP 2:31 PM [facility]

## 2021-01-09 ENCOUNTER — Ambulatory Visit (INDEPENDENT_AMBULATORY_CARE_PROVIDER_SITE_OTHER): Payer: Medicare HMO | Admitting: Nurse Practitioner

## 2021-01-09 ENCOUNTER — Encounter: Payer: Self-pay | Admitting: Nurse Practitioner

## 2021-01-09 ENCOUNTER — Other Ambulatory Visit: Payer: Self-pay

## 2021-01-09 VITALS — BP 110/52 | HR 81 | Temp 97.7°F | Wt 130.2 lb

## 2021-01-09 DIAGNOSIS — Z1329 Encounter for screening for other suspected endocrine disorder: Secondary | ICD-10-CM | POA: Diagnosis not present

## 2021-01-09 DIAGNOSIS — N1831 Chronic kidney disease, stage 3a: Secondary | ICD-10-CM

## 2021-01-09 DIAGNOSIS — J40 Bronchitis, not specified as acute or chronic: Secondary | ICD-10-CM

## 2021-01-09 DIAGNOSIS — J449 Chronic obstructive pulmonary disease, unspecified: Secondary | ICD-10-CM | POA: Diagnosis not present

## 2021-01-09 DIAGNOSIS — E782 Mixed hyperlipidemia: Secondary | ICD-10-CM

## 2021-01-09 DIAGNOSIS — I10 Essential (primary) hypertension: Secondary | ICD-10-CM

## 2021-01-09 DIAGNOSIS — G459 Transient cerebral ischemic attack, unspecified: Secondary | ICD-10-CM | POA: Diagnosis not present

## 2021-01-09 DIAGNOSIS — I700 Atherosclerosis of aorta: Secondary | ICD-10-CM

## 2021-01-09 MED ORDER — DEXAMETHASONE 1 MG PO TABS
ORAL_TABLET | ORAL | 0 refills | Status: DC
Start: 2021-01-09 — End: 2021-04-14

## 2021-01-09 MED ORDER — AZITHROMYCIN 250 MG PO TABS
ORAL_TABLET | ORAL | 1 refills | Status: DC
Start: 2021-01-09 — End: 2021-04-14

## 2021-01-09 NOTE — Patient Instructions (Signed)
Acute Bronchitis, Adult  Acute bronchitis is when air tubes in the lungs (bronchi) suddenly get swollen. The condition can make it hard for you to breathe. In adults, acute bronchitis usually goes away within 2 weeks. A cough caused by bronchitis may last up to 3 weeks. Smoking, allergies, and asthma can make thecondition worse. What are the causes? This condition is caused by: Cold and flu viruses. The most common cause of this condition is the virus that causes the common cold. Bacteria. Substances that irritate the lungs, including: Smoke from cigarettes and other types of tobacco. Dust and pollen. Fumes from chemicals, gases, or burned fuel. Other materials that pollute indoor or outdoor air. Close contact with someone who has acute bronchitis. What increases the risk? The following factors may make you more likely to develop this condition: A weak body's defense system. This is also called the immune system. Any condition that affects your lungs and breathing, such as asthma. What are the signs or symptoms? Symptoms of this condition include: A cough. Coughing up clear, yellow, or green mucus. Wheezing. Having too much mucus in your lungs (chest congestion). Shortness of breath. A fever. Chills. Body aches. A sore throat. How is this treated? Acute bronchitis may go away over time without treatment. Your doctor may recommend: Drinking more fluids. Using a device that gets medicine into your lungs (inhaler). Using a vaporizer or a humidifier. These are machines that add water or moisture to the air. This helps with coughing and poor breathing. Taking a medicine for fever. Taking a medicine that thins mucus and clears congestion. Taking a medicine that prevents or stops coughing. Follow these instructions at home: Activity Get a lot of rest. Return to your normal activities as told by your doctor. Ask your doctor what activities are safe for you. Lifestyle  Drink enough  fluid to keep your pee (urine) pale yellow. Do not drink alcohol. Do not use any products that contain nicotine or tobacco, such as cigarettes, e-cigarettes, and chewing tobacco. If you need help quitting, ask your doctor. Be aware that: Your bronchitis will get worse if you smoke or breathe in other people's smoke (secondhand smoke). Your lungs will heal faster if you quit smoking.  General instructions Take over-the-counter and prescription medicines only as told by your doctor. Use an inhaler, cool mist vaporizer, or humidifier as told by your doctor. Rinse your mouth often with salt water. To make salt water, dissolve -1 tsp (3-6 g) of salt in 1 cup (237 mL) of warm water. Take two teaspoons of honey at bedtime. This helps lessen your coughing at night. Keep all follow-up visits as told by your doctor. This is important. How is this prevented? To lower your risk of getting this condition again: Wash your hands often with soap and water. If you cannot use soap and water, use hand sanitizer. Avoid contact with people who have cold symptoms. Try not to touch your mouth, nose, or eyes with your hands. Make sure to get the flu shot every year. Contact a doctor if: Your symptoms do not get better in 2 weeks. You vomit more than once or twice. You have symptoms of loss of fluid from your body (dehydration). These include: Dark pee. Dry skin or eyes. Increased thirst. Headaches. Confusion. Muscle cramps. Get help right away if: You cough up blood. You have chest pain. You have very bad shortness of breath. You become dehydrated. You faint or keep feeling like you are going to faint. You   have a very bad headache. Your fever or chills get worse. These symptoms may be an emergency. Get help right away. Call your local emergency services (911 in the [place] Do not wait to see if the symptoms will go away. Do not drive yourself to the hospital. Summary Acute bronchitis is when air  tubes in the lungs (bronchi) suddenly get swollen. In adults, acute bronchitis usually goes away within 2 weeks. Take over-the-counter and prescription medicines only as told by your doctor. Drink enough fluid to keep your pee (urine) pale yellow. Contact a doctor if your symptoms do not improve after 2 weeks of treatment. Get help right away if you cough up blood, faint, or have chest pain or shortness of breath. This information is not intended to replace advice given to you by your health care provider. Make sure you discuss any questions you have with your healthcare provider. Document Revised: 04/24/2020 Document Reviewed: 12/16/2018 Elsevier Patient Education  2022 [employer]  

## 2021-01-10 LAB — CBC WITH DIFFERENTIAL/PLATELET
Absolute Monocytes: 233 cells/uL (ref 200–950)
Basophils Absolute: 58 cells/uL (ref 0–200)
Basophils Relative: 0.6 %
Eosinophils Absolute: 19 cells/uL (ref 15–500)
Eosinophils Relative: 0.2 %
HCT: 39.8 % (ref 38.5–50.0)
Hemoglobin: 13.2 g/dL (ref 13.2–17.1)
Lymphs Abs: 553 cells/uL — ABNORMAL LOW (ref 850–3900)
MCH: 32.4 pg (ref 27.0–33.0)
MCHC: 33.2 g/dL (ref 32.0–36.0)
MCV: 97.5 fL (ref 80.0–100.0)
MPV: 9.1 fL (ref 7.5–12.5)
Monocytes Relative: 2.4 %
Neutro Abs: 8837 cells/uL — ABNORMAL HIGH (ref 1500–7800)
Neutrophils Relative %: 91.1 %
Platelets: 393 10*3/uL (ref 140–400)
RBC: 4.08 10*6/uL — ABNORMAL LOW (ref 4.20–5.80)
RDW: 13.3 % (ref 11.0–15.0)
Total Lymphocyte: 5.7 %
WBC: 9.7 10*3/uL (ref 3.8–10.8)

## 2021-01-10 LAB — COMPLETE METABOLIC PANEL WITH GFR
AG Ratio: 1.7 (calc) (ref 1.0–2.5)
ALT: 9 U/L (ref 9–46)
AST: 15 U/L (ref 10–35)
Albumin: 4 g/dL (ref 3.6–5.1)
Alkaline phosphatase (APISO): 60 U/L (ref 35–144)
BUN: 18 mg/dL (ref 7–25)
CO2: 27 mmol/L (ref 20–32)
Calcium: 10.1 mg/dL (ref 8.6–10.3)
Chloride: 105 mmol/L (ref 98–110)
Creat: 1.18 mg/dL (ref 0.70–1.22)
Globulin: 2.3 g/dL (calc) (ref 1.9–3.7)
Glucose, Bld: 110 mg/dL — ABNORMAL HIGH (ref 65–99)
Potassium: 5.5 mmol/L — ABNORMAL HIGH (ref 3.5–5.3)
Sodium: 139 mmol/L (ref 135–146)
Total Bilirubin: 0.3 mg/dL (ref 0.2–1.2)
Total Protein: 6.3 g/dL (ref 6.1–8.1)
eGFR: 62 mL/min/{1.73_m2} (ref >=60)

## 2021-01-10 LAB — TSH: TSH: 0.57 mIU/L (ref 0.40–4.50)

## 2021-01-10 LAB — LIPID PANEL
Cholesterol: 199 mg/dL (ref <200)
HDL: 46 mg/dL (ref >=40)
LDL Cholesterol (Calc): 118 mg/dL (calc) — ABNORMAL HIGH
Non-HDL Cholesterol (Calc): 153 mg/dL (calc) — ABNORMAL HIGH (ref <130)
Total CHOL/HDL Ratio: 4.3 (calc) (ref <5.0)
Triglycerides: 231 mg/dL — ABNORMAL HIGH (ref <150)

## 2021-03-10 ENCOUNTER — Other Ambulatory Visit: Payer: Self-pay | Admitting: Adult Health Nurse Practitioner

## 2021-03-10 DIAGNOSIS — R634 Abnormal weight loss: Secondary | ICD-10-CM

## 2021-03-12 DIAGNOSIS — J069 Acute upper respiratory infection, unspecified: Secondary | ICD-10-CM | POA: Insufficient documentation

## 2021-03-12 DIAGNOSIS — J441 Chronic obstructive pulmonary disease with (acute) exacerbation: Secondary | ICD-10-CM | POA: Diagnosis not present

## 2021-03-12 DIAGNOSIS — R0600 Dyspnea, unspecified: Secondary | ICD-10-CM | POA: Insufficient documentation

## 2021-04-01 DIAGNOSIS — Z85828 Personal history of other malignant neoplasm of skin: Secondary | ICD-10-CM | POA: Diagnosis not present

## 2021-04-01 DIAGNOSIS — L570 Actinic keratosis: Secondary | ICD-10-CM | POA: Diagnosis not present

## 2021-04-01 DIAGNOSIS — D692 Other nonthrombocytopenic purpura: Secondary | ICD-10-CM | POA: Diagnosis not present

## 2021-04-01 DIAGNOSIS — C44229 Squamous cell carcinoma of skin of left ear and external auricular canal: Secondary | ICD-10-CM | POA: Diagnosis not present

## 2021-04-01 DIAGNOSIS — C44329 Squamous cell carcinoma of skin of other parts of face: Secondary | ICD-10-CM | POA: Diagnosis not present

## 2021-04-01 DIAGNOSIS — L821 Other seborrheic keratosis: Secondary | ICD-10-CM | POA: Diagnosis not present

## 2021-04-14 ENCOUNTER — Telehealth: Payer: Self-pay | Admitting: Internal Medicine

## 2021-04-14 ENCOUNTER — Encounter: Payer: Self-pay | Admitting: Nurse Practitioner

## 2021-04-14 ENCOUNTER — Other Ambulatory Visit: Payer: Self-pay

## 2021-04-14 ENCOUNTER — Ambulatory Visit (INDEPENDENT_AMBULATORY_CARE_PROVIDER_SITE_OTHER): Payer: Medicare HMO | Admitting: Nurse Practitioner

## 2021-04-14 VITALS — BP 138/84 | HR 74 | Temp 97.9°F | Wt 132.0 lb

## 2021-04-14 DIAGNOSIS — Z89511 Acquired absence of right leg below knee: Secondary | ICD-10-CM

## 2021-04-14 DIAGNOSIS — E782 Mixed hyperlipidemia: Secondary | ICD-10-CM

## 2021-04-14 DIAGNOSIS — N1831 Chronic kidney disease, stage 3a: Secondary | ICD-10-CM | POA: Diagnosis not present

## 2021-04-14 DIAGNOSIS — Z79899 Other long term (current) drug therapy: Secondary | ICD-10-CM

## 2021-04-14 DIAGNOSIS — C439 Malignant melanoma of skin, unspecified: Secondary | ICD-10-CM

## 2021-04-14 DIAGNOSIS — R6889 Other general symptoms and signs: Secondary | ICD-10-CM

## 2021-04-14 DIAGNOSIS — E559 Vitamin D deficiency, unspecified: Secondary | ICD-10-CM | POA: Diagnosis not present

## 2021-04-14 DIAGNOSIS — R7309 Other abnormal glucose: Secondary | ICD-10-CM | POA: Diagnosis not present

## 2021-04-14 DIAGNOSIS — J449 Chronic obstructive pulmonary disease, unspecified: Secondary | ICD-10-CM | POA: Diagnosis not present

## 2021-04-14 DIAGNOSIS — K219 Gastro-esophageal reflux disease without esophagitis: Secondary | ICD-10-CM

## 2021-04-14 DIAGNOSIS — I10 Essential (primary) hypertension: Secondary | ICD-10-CM | POA: Diagnosis not present

## 2021-04-14 DIAGNOSIS — Z23 Encounter for immunization: Secondary | ICD-10-CM | POA: Diagnosis not present

## 2021-04-14 DIAGNOSIS — N401 Enlarged prostate with lower urinary tract symptoms: Secondary | ICD-10-CM

## 2021-04-14 DIAGNOSIS — Z0001 Encounter for general adult medical examination with abnormal findings: Secondary | ICD-10-CM | POA: Diagnosis not present

## 2021-04-14 DIAGNOSIS — I700 Atherosclerosis of aorta: Secondary | ICD-10-CM

## 2021-04-14 DIAGNOSIS — N138 Other obstructive and reflux uropathy: Secondary | ICD-10-CM

## 2021-04-14 DIAGNOSIS — Z6820 Body mass index (BMI) 20.0-20.9, adult: Secondary | ICD-10-CM

## 2021-04-14 MED ORDER — NITROGLYCERIN 0.4 MG SL SUBL: 0.4000 mg | SUBLINGUAL_TABLET | SUBLINGUAL | 4 refills | Status: DC | PRN

## 2021-04-14 NOTE — Patient Instructions (Signed)
COPD and Physical Activity Chronic obstructive pulmonary disease (COPD) is a long-term, or chronic, condition that affects the lungs. COPD is a general term that can be used to describe many problems that cause inflammation of the lungs and limit airflow. These conditions include chronic bronchitis and emphysema. The main symptom of COPD is shortness of breath, which makes it harder to do even simple tasks. This can also make it harder to exercise and stay active. Talk with your health care provider about treatments to help you breathe better and actions you can take to prevent breathing problems during physical activity. What are the benefits of exercising when you have COPD? Exercising regularly is an important part of a healthy lifestyle. You can still exercise and do physical activities even though you have COPD. Exercise and physical activity improve your shortness of breath by increasing blood flow (circulation). This causes your heart to pump more oxygen through your body. Moderate exercise can: Improve oxygen use. Increase your energy level. Help with shortness of breath. Strengthen your breathing muscles. Improve heart health. Help with sleep. Improve your self-esteem and feelings of self-worth. Lower depression, stress, and anxiety. Exercise can benefit everyone with COPD. The severity of your disease may affect how hard you can exercise, especially at first, but everyone can benefit. Talk with your health care provider about how much exercise is safe for you, and which activities and exercises are safe for you. What actions can I take to prevent breathing problems during physical activity? Sign up for a pulmonary rehabilitation program. This type of program may include: Education about lung diseases. Exercise classes that teach you how to exercise and be more active while improving your breathing. This usually involves: Exercise using your lower extremities, such as a stationary  bicycle. About 30 minutes of exercise, 2 to 5 times per week, for 6 to 12 weeks. Strength training, such as push-ups or leg lifts. Nutrition education. Group classes in which you can talk with others who also have COPD and learn ways to manage stress. If you use an oxygen tank, you should use it while you exercise. Work with your health care provider to adjust your oxygen for your physical activity. Your resting flow rate is different from your flow rate during physical activity. How to manage your breathing while exercising While you are exercising: Take slow breaths. Pace yourself, and do nottry to go too fast. Purse your lips while breathing out. Pursing your lips is similar to a kissing or whistling position. If doing exercise that uses a quick burst of effort, such as weight lifting: Breathe in before starting the exercise. Breathe out during the hardest part of the exercise, such as raising the weights. Where to find support You can find support for exercising with COPD from: Your health care provider. A pulmonary rehabilitation program. Your local health department or community health programs. Support groups, either online or in-person. Your health care provider may be able to recommend support groups. Where to find more information You can find more information about exercising with COPD from: American Lung Association: lung.org COPD Foundation: copdfoundation.org Contact a health care provider if: Your symptoms get worse. You have nausea. You have a fever. You want to start a new exercise program or a new activity. Get help right away if: You have chest pain. You cannot breathe. These symptoms may represent a serious problem that is an emergency. Do not wait to see if the symptoms will go away. Get medical help right away. Call   your local emergency services (911 in the [place] Do not drive yourself to the hospital. Summary COPD is a general term that can be used to describe  many different lung problems that cause lung inflammation and limit airflow. This includes chronic bronchitis and emphysema. Exercise and physical activity improve your shortness of breath by increasing blood flow (circulation). This causes your heart to provide more oxygen to your body. Contact your health care provider before starting any exercise program or new activity. Ask your health care provider what exercises and activities are safe for you. This information is not intended to replace advice given to you by your health care provider. Make sure you discuss any questions you have with your health care provider. Document Revised: 04/02/2020 Document Reviewed: 04/02/2020 Elsevier Patient Education  2022 Elsevier Inc.  

## 2021-04-14 NOTE — Progress Notes (Signed)
MEDICARE ANNUAL WELLNESS VISIT AND 3 MONTH FOLLOW UP  Assessment:   Gerrard was seen today for follow-up and medicare wellness.  Diagnoses and all orders for this visit:  Medicare annual wellness visit, subsequent Yearly  Essential hypertension Continue current medications: Olmesartan / HCTZ 20/12.33m Monitor blood pressure at home; call if consistently over 130/80 Continue DASH diet.   Reminder to go to the ER if any CP, SOB, nausea, dizziness, severe HA, changes vision/speech, left arm numbness and tingling and jaw pain. -     CBC with Differential/Platelet -     COMPLETE METABOLIC PANEL WITH GFR  Hyperlipidemia, mixed Continue medications: Rosuvastatin 258mdaily Discussed dietary and exercise modifications Low fat diet -     Lipid panel  Thoracic Aortic Atherosclerosis Continue to control BP, Cholesterol and follow low saturated fat diet, monitor  Abnormal glucose Discussed dietary and exercise modifications A1c  Vitamin D deficiency Continue supplementation Taking Vitamin D 5,000 IU daily, two tablets daily At goal defer lab today  Stage 3a chronic kidney disease Increase fluids  Avoid NSAIDS Blood pressure control Monitor sugars  Will continue to monitor  Gastroesophageal reflux 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 Will have pt return for walk test for suupplemental oxygen.  Has quit smoking x 1 year  BPH with obstruction/lower urinary tract symptoms Doing well at this time Continue to monitor  Malignant Melanoma Continue to follow with dermatology,  Dr. WhiElvera LennoxMI 20 Discussed dietary needs and regular eating patterns as well as exercise modifications  Medication management Continued  Flu vaccine Need High dose flu vaccine given  Over 40 minutes of face to face interview, exam, counseling, chart review, and critical decision making was performed.  Future Appointments  Date Time Provider DepShirley/17/2023 11:00 AM McKUnk PintoD [facility] None  04/13/2022 11:00 AM MulMagda BernheimP [facility] None     Plan:   During the course of the visit the patient was educated and counseled about appropriate screening and preventive services including:   Pneumococcal vaccine  Influenza vaccine Prevnar 13 Td vaccine Screening electrocardiogram Colorectal cancer screening Diabetes screening Glaucoma screening Nutrition counseling    Subjective:  Leslie Cardenas a 80 1o. male who presents for Medicare Annual Wellness Visit and 3 month follow up for HTN,HLD, prediabetes, CKDIII, COPD GERD and vitamin D Def.   Pt has noticed his shortness of breath is worsening with walking.  He will feel like he can not get a breath and has associated dizziness at time. He is having the episodes much more frequently. He is interested in home O2 for supplementation when he has these episodes, has had to go to urgent care in the past for this.  BMI is Body mass index is 20.07 kg/m., he has been working on diet and exercise. Wt Readings from Last 3 Encounters:  04/14/21 132 lb (59.9 kg)  01/09/21 130 lb 3.2 oz (59.1 kg)  12/21/20 128 lb 15.5 oz (58.5 kg)    His blood pressure has been controlled at home, today their BP is BP: 138/84 BP Readings from Last 3 Encounters:  04/14/21 138/84  01/09/21 (!)Marland Kitchen  110/52  12/22/20 (!) 144/63    He does workout by means of yard and housework. He denies chest pain, dizziness.  He is on cholesterol medication Zetia 444m and Crestor 20 mg QD and denies myalgias. His cholesterol is not at  goal. The cholesterol last visit was:   Lab Results  Component Value Date   CHOL 231 (H) 05/22/2019   HDL 44 05/22/2019   LDLCALC 147 (H) 05/22/2019   TRIG 261 (H) 05/22/2019   CHOLHDL 5.3 (H) 05/22/2019    He has 3 areas of melanoma on each era and right cheek,is going back on the 23rd for further removal of cheek melanoma with dermatology.   He has been working on diet and exercise for prediabetes, and denies hyperglycemia, hypoglycemia , increased appetite, nausea, polydipsia and polyuria. Last A1C in the office was:  Lab Results  Component Value Date   HGBA1C 6.1 (H) 05/22/2019   Last GFR Lab Results  Component Value Date   GFRNONAA 48 (L) 05/22/2019     Lab Results  Component Value Date   GFRAA 55 (L) 05/22/2019   Patient is on Vitamin D supplement.   Lab Results  Component Value Date   VD25OH 70 05/22/2019      Medication Review:  Current Outpatient Medications (Endocrine & Metabolic):    predniSONE (DELTASONE) 5 MG tablet, Take 5 mg by mouth daily with breakfast. 572min the mornign and then 44m344mt night  Current Outpatient Medications (Cardiovascular):    ezetimibe (ZETIA) 10 MG tablet, Take 1 tablet (10 mg total) by mouth daily.   rosuvastatin (CRESTOR) 20 MG tablet, TAKE 1 TABLET BY MOUTH EVERY DAY FOR CHOLESTEROL   nitroGLYCERIN (NITROSTAT) 0.4 MG SL tablet, Place 1 tablet (0.4 mg total) under the tongue every 5 (five) minutes x 3 doses as needed for chest pain.  Current Outpatient Medications (Respiratory):    albuterol (PROVENTIL) (2.5 MG/3ML) 0.083% nebulizer solution, Inhale 1 vial by Nebulizer 4 x /day or every 4 hr to rescue Asthma   albuterol (VENTOLIN HFA) 108 (90 Base) MCG/ACT inhaler, Inhale 2 puffs into the lungs every 4 (four) hours as needed for wheezing or shortness of breath. Make sure you rinse mouth out after each use.   loratadine (CLARITIN) 10 MG tablet, TAKE 1 TABLET BY MOUTH DAILY FOR ALLERGIES  Current Outpatient Medications  (Analgesics):    acetaminophen (TYLENOL) 325 MG tablet, Take 325 mg by mouth every 6 (six) hours as needed.   Current Outpatient Medications (Other):    Ensure (ENSURE), Take 237 mLs by mouth. Drinks one with his meals----per patient-09/04/2020   Multiple Vitamins-Minerals (MULTIVITAMIN WITH MINERALS) tablet, Take 1 tablet by mouth daily.   omeprazole (PRILOSEC) 40 MG capsule, TAKE 1 CAPSULE DAILY TO PREVENT HEARTBURN & INDIGESTION   Respiratory Therapy Supplies (NEBULIZER/TUBING/MOUTHPIECE) KIT, Disp one nebulizer machine, tubing set and mouthpiece kit for use 4 times daily   tiZANidine (ZANAFLEX) 4 MG tablet, TAKE 1 TABLET 3 X /DAY FOR BACK PAIN & MUSCLE SPASMS   VITAMIN D PO, Take 5,000 Units by mouth daily.  Allergies: Not on File  Current Problems (verified) has Hyperlipidemia, mixed; COPD (chronic obstructive pulmonary disease) (HCCProtectionSinus bradycardia; History of colonic polyps; Essential hypertension; Prediabetes; Vitamin D deficiency; Medication management; Hx of right BKA (HCCWorthvilleASHD hx/o MI; GERD; Medicare annual wellness visit, initial; Subacute osteomyelitis of right tibia (HCCPiper CityS/P unilateral BKA (below knee amputation), right (HCCColonNephrolithiasis; Hydronephrosis; FH: hypertension; CKD (chronic kidney disease) stage 3, GFR 30-59 ml/min (HCC);  Abnormal glucose; Thoracic aortic atherosclerosis ([place]) by CXR on 07/29/2018; Blurred vision; and TIA (transient ischemic attack) on their problem list.  Screening Tests Immunization History  Administered Date(s) Administered   Influenza Split 04/08/2012   Influenza, High Dose Seasonal PF 03/04/2017   Influenza,inj,Quad PF,6+ Mos 05/16/2015, 02/15/2018   Moderna Sars-Covid-2 Vaccination 07/15/2019, 08/12/2019   Pneumococcal Conjugate-13 07/25/2014   Pneumococcal Polysaccharide-23 04/08/2012   Pneumococcal-Unspecified 06/06/2002   Td 06/30/2011   Tdap 10/21/2019   Zoster, Live 05/22/2008    Preventative care: Last colonoscopy:  2018  Prior vaccinations: TD or Tdap: 10/21/19  Influenza:04/14/21 Pneumococcal: 2013 Prevnar13: 2016 Shingles/Zostavax: 2009  Names of Other Physician/Practitioners you currently use: 1. [facility] here for primary care 2.Eye Exam [place] 2021 3. Dentist, has dentures.  Fit well, no issues.  Patient Care Team: Unk Pinto, MD as PCP - General (Internal Medicine) Luberta Mutter, MD as Consulting Physician (Ophthalmology) Gatha Mayer, MD as Consulting Physician (Gastroenterology) Brand Males, MD as Consulting Physician (Pulmonary Disease)  Surgical: He  has a past surgical history that includes right leg amputation (05/14/1977); Cardiac catheterization (5732); Colonoscopy w/ polypectomy (08/12/2010); Inguinal hernia repair; Esophagogastroduodenoscopy; Shoulder arthroscopy (Left, 2000); Cataract extraction, bilateral; Colonoscopy; Cystoscopy w/ ureteral stent placement (Right, 03/04/2018); UPPER GASTROINTESTINAL ENDOSCOPY  WITH DILATION (02/24/2018); Colonoscopy w/ polypectomy (07/16/2016); and Cystoscopy/ureteroscopy/holmium laser/stent placement (Right, 03/16/2018). Family His family history includes Cancer in his mother; Colon cancer in his brother; Hypertension in his father; Leukemia in his brother; Other in his brother; Pancreatic cancer in his mother; Prostate cancer in his brother. Social history  He reports that he quit smoking about 21 years ago. His smoking use included cigarettes. He has a 60.00 pack-year smoking history. He has never used smokeless tobacco. He reports that he does not currently use alcohol. He reports that he does not use drugs.  MEDICARE WELLNESS OBJECTIVES: Physical activity: Current Exercise Habits: The patient does not participate in regular exercise at present, Exercise limited by: respiratory conditions(s) Cardiac risk factors: Cardiac Risk Factors include: advanced age (>71mn, >>48 women);dyslipidemia;hypertension;sedentary lifestyle;smoking/ tobacco exposure;male gender Depression/mood screen:   [facility] 04/14/2021  Decreased Interest 0  Down, Depressed, Hopeless 1  PHQ - 2 Score 1    ADLs:  In your present state of health, do you have any difficulty performing the following activities: 04/14/2021 12/21/2020  Hearing? N N  Vision? N N  Difficulty concentrating or making decisions? N N  Walking or climbing stairs? Y N  Comment shortness of breath and fatigue -  Dressing or bathing? N N  Doing errands, shopping? N N  Some recent data might be hidden     Cognitive Testing  Alert? Yes  Normal Appearance?Yes  Oriented to person? Yes  Place? Yes   Time? Yes  Recall of three objects?  Yes  Can perform simple calculations? Yes  Displays appropriate judgment?Yes  Can read the correct time from a watch face?Yes  EOL planning: Does Patient Have a Medical Advance Directive?: No Would patient like information on creating a medical advance directive?: Yes (MAU/Ambulatory/Procedural Areas - Information given)   Objective:   Today's Vitals   04/14/21 0927  BP: 138/84  Pulse: 74  Temp: 97.9 F (36.6 C)  SpO2: 94%  Weight: 132 lb (59.9 kg)   Body mass index is 20.07 kg/m.  General appearance: alert, no distress, WD/WN, male HEENT: normocephalic, sclerae anicteric, TMs pearly, nares patent, no discharge or erythema, pharynx normal Oral cavity: MMM, no lesions  Neck: supple, no lymphadenopathy, no thyromegaly, no masses Heart: RRR, normal S1, S2, no murmurs Lungs: Rhonchi noted in all lung fields with wheezing bilateral upper lobes. Abdomen: +bs, soft, non tender, non distended, no masses, no hepatomegaly, no splenomegaly Musculoskeletal: nontender, no swelling, no obvious deformity Skin: excision of melanoma of upper ear bilaterally and right face healing Extremities: no edema, no cyanosis, no clubbing. R BK prosthetic no skin breakdown of R  BKA area Pulses: 2+ symmetric, upper and lower extremities, normal cap refill Neurological: alert, oriented x 3, CN2-12 intact, strength normal upper extremities and lower extremities, sensation normal throughout, DTRs 2+ throughout, no cerebellar signs, gait normal Psychiatric: normal affect, behavior normal, pleasant   Medicare Attestation I have personally reviewed: The patient's medical and social history Their use of alcohol, tobacco or illicit drugs Their current medications and supplements The patient's functional ability including ADLs,fall risks, home safety risks, cognitive, and hearing and visual impairment Diet and physical activities Evidence for depression or mood disorders  The patient's weight, height, BMI, and visual acuity have been recorded in the chart.  I have made referrals, counseling, and provided education to the patient based on review of the above and I have provided the patient with a written personalized care plan for preventive services.     Magda Bernheim ANP-C  [facility]  04/14/2021

## 2021-04-14 NOTE — Progress Notes (Signed)
  Chronic Care Management   Outreach Note  04/14/2021 Name: Leslie Cardenas MRN: [id number] DOB: 1941-04-01  Referred by: Unk Pinto, MD Reason for referral : No chief complaint on file.   An unsuccessful telephone outreach was attempted today. The patient was referred to the pharmacist for assistance with care management and care coordination.   Follow Up Plan:   Tatjana Dellinger Upstream Scheduler

## 2021-04-15 DIAGNOSIS — Z85828 Personal history of other malignant neoplasm of skin: Secondary | ICD-10-CM | POA: Diagnosis not present

## 2021-04-15 DIAGNOSIS — C44329 Squamous cell carcinoma of skin of other parts of face: Secondary | ICD-10-CM | POA: Diagnosis not present

## 2021-04-15 LAB — COMPLETE METABOLIC PANEL WITH GFR
AG Ratio: 1.5 (calc) (ref 1.0–2.5)
ALT: 10 U/L (ref 9–46)
AST: 16 U/L (ref 10–35)
Albumin: 4 g/dL (ref 3.6–5.1)
Alkaline phosphatase (APISO): 65 U/L (ref 35–144)
BUN: 12 mg/dL (ref 7–25)
CO2: 32 mmol/L (ref 20–32)
Calcium: 9.9 mg/dL (ref 8.6–10.3)
Chloride: 102 mmol/L (ref 98–110)
Creat: 1.14 mg/dL (ref 0.70–1.22)
Globulin: 2.7 g/dL (calc) (ref 1.9–3.7)
Glucose, Bld: 86 mg/dL (ref 65–99)
Potassium: 5 mmol/L (ref 3.5–5.3)
Sodium: 139 mmol/L (ref 135–146)
Total Bilirubin: 0.3 mg/dL (ref 0.2–1.2)
Total Protein: 6.7 g/dL (ref 6.1–8.1)
eGFR: 65 mL/min/{1.73_m2} (ref >=60)

## 2021-04-15 LAB — CBC WITH DIFFERENTIAL/PLATELET
Absolute Monocytes: 863 cells/uL (ref 200–950)
Basophils Absolute: 100 cells/uL (ref 0–200)
Basophils Relative: 1.2 %
Eosinophils Absolute: 216 cells/uL (ref 15–500)
Eosinophils Relative: 2.6 %
HCT: 41.6 % (ref 38.5–50.0)
Hemoglobin: 13.7 g/dL (ref 13.2–17.1)
Lymphs Abs: 1137 cells/uL (ref 850–3900)
MCH: 31.7 pg (ref 27.0–33.0)
MCHC: 32.9 g/dL (ref 32.0–36.0)
MCV: 96.3 fL (ref 80.0–100.0)
MPV: 8.9 fL (ref 7.5–12.5)
Monocytes Relative: 10.4 %
Neutro Abs: 5984 cells/uL (ref 1500–7800)
Neutrophils Relative %: 72.1 %
Platelets: 386 10*3/uL (ref 140–400)
RBC: 4.32 10*6/uL (ref 4.20–5.80)
RDW: 13.3 % (ref 11.0–15.0)
Total Lymphocyte: 13.7 %
WBC: 8.3 10*3/uL (ref 3.8–10.8)

## 2021-04-15 LAB — LIPID PANEL
Cholesterol: 224 mg/dL — ABNORMAL HIGH (ref <200)
HDL: 46 mg/dL (ref >=40)
LDL Cholesterol (Calc): 142 mg/dL (calc) — ABNORMAL HIGH
Non-HDL Cholesterol (Calc): 178 mg/dL (calc) — ABNORMAL HIGH (ref <130)
Total CHOL/HDL Ratio: 4.9 (calc) (ref <5.0)
Triglycerides: 219 mg/dL — ABNORMAL HIGH (ref <150)

## 2021-04-15 LAB — HEMOGLOBIN A1C
Hgb A1c MFr Bld: 5.7 % of total Hgb — ABNORMAL HIGH (ref <5.7)
Mean Plasma Glucose: 117 mg/dL
eAG (mmol/L): 6.5 mmol/L

## 2021-04-18 ENCOUNTER — Telehealth: Payer: Self-pay | Admitting: Internal Medicine

## 2021-04-18 NOTE — Chronic Care Management (AMB) (Signed)
  Chronic Care Management   Outreach Note  04/18/2021 Name: Leslie Cardenas MRN: [id number] DOB: 1941/06/08  Referred by: Unk Pinto, MD Reason for referral : No chief complaint on file.   A second unsuccessful telephone outreach was attempted today. The patient was referred to pharmacist for assistance with care management and care coordination.  Follow Up Plan:   Tatjana Dellinger Upstream Scheduler

## 2021-04-24 ENCOUNTER — Telehealth: Payer: Self-pay | Admitting: Internal Medicine

## 2021-04-24 NOTE — Progress Notes (Signed)
  Chronic Care Management   Outreach Note  04/24/2021 Name: ANTRELL TIPLER MRN: [id number] DOB: 1940-11-18  Referred by: Unk Pinto, MD Reason for referral : No chief complaint on file.   Third unsuccessful telephone outreach was attempted today. The patient was referred to the pharmacist for assistance with care management and care coordination.   Follow Up Plan:   Tatjana Dellinger Upstream Scheduler

## 2021-05-12 NOTE — Progress Notes (Deleted)
Assessment and Plan:  There are no diagnoses linked to this encounter.    Further disposition pending results of labs. Discussed med's effects and SE's.   Over 30 minutes of exam, counseling, chart review, and critical decision making was performed.   Future Appointments  Date Time Provider [place]  05/13/2021 11:30 AM Magda Bernheim, NP [facility] None  06/24/2021 11:00 AM Unk Pinto, MD [facility] None  04/13/2022 11:00 AM Magda Bernheim, NP [facility] None    ------------------------------------------------------------------------------------------------------------------   HPI There were no vitals taken for this visit. 80 y.o.male presents for  Past Medical History:  Diagnosis Date   Aortic atherosclerosis ([place]) 03/04/2018   Noted on CT Abd/pelvis   Cataract    Bilateral   Colon polyp    COPD (chronic obstructive pulmonary disease) (HCC)    Coronary artery disease    Diverticulosis 07/16/2016   SIGMOID, NOTED ON COLONOSCOPY   Esophageal stenosis 02/24/2018   noted on endoscopy   Esophageal stricture    GERD (gastroesophageal reflux disease)    Grade I diastolic dysfunction 81/85/6314   noted on ECHO   History of hiatal hernia 02/24/2018   Small noted on endoscopy   Hydronephrosis    Hydronephrosis of right kidney 03/04/2018   URETERAL STONE 7 MM, NOTED ONJ CT ABD/PELVIS   Hyperlipidemia    Myocardial infarction [facility])    [id number]   Nephrolithiasis    s/p stent placement   Skin cancer    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 NOTED ON CT ABD/PELVIS   Vitamin D deficiency    Wears dentures    upper     Not on File  Current Outpatient Medications on File Prior to Visit  Medication Sig   acetaminophen (TYLENOL) 325 MG tablet Take 325 mg by mouth every 6 (six) hours as needed.   albuterol (PROVENTIL) (2.5 MG/3ML) 0.083% nebulizer solution Inhale 1 vial by Nebulizer 4 x /day or every 4 hr to  rescue Asthma   albuterol (VENTOLIN HFA) 108 (90 Base) MCG/ACT inhaler Inhale 2 puffs into the lungs every 4 (four) hours as needed for wheezing or shortness of breath. Make sure you rinse mouth out after each use.   Ensure (ENSURE) Take 237 mLs by mouth. Drinks one with his meals----per patient-09/04/2020   ezetimibe (ZETIA) 10 MG tablet Take 1 tablet (10 mg total) by mouth daily.   loratadine (CLARITIN) 10 MG tablet TAKE 1 TABLET BY MOUTH DAILY FOR ALLERGIES   Multiple Vitamins-Minerals (MULTIVITAMIN WITH MINERALS) tablet Take 1 tablet by mouth daily.   nitroGLYCERIN (NITROSTAT) 0.4 MG SL tablet Place 1 tablet (0.4 mg total) under the tongue every 5 (five) minutes x 3 doses as needed for chest pain.   omeprazole (PRILOSEC) 40 MG capsule TAKE 1 CAPSULE DAILY TO PREVENT HEARTBURN & INDIGESTION   predniSONE (DELTASONE) 5 MG tablet Take 5 mg by mouth daily with breakfast. 64m in the mornign and then 570mat night   Respiratory Therapy Supplies (NEBULIZER/TUBING/MOUTHPIECE) KIT Disp one nebulizer machine, tubing set and mouthpiece kit for use 4 times daily   rosuvastatin (CRESTOR) 20 MG tablet TAKE 1 TABLET BY MOUTH EVERY DAY FOR CHOLESTEROL   tiZANidine (ZANAFLEX) 4 MG tablet TAKE 1 TABLET 3 X /DAY FOR BACK PAIN & MUSCLE SPASMS   VITAMIN D PO Take 5,000 Units by mouth daily.   No current facility-administered medications on file prior to visit.    ROS: all  negative except above.   Physical Exam:  There were no vitals taken for this visit.  General Appearance: Well nourished, in no apparent distress. Eyes: PERRLA, EOMs, conjunctiva no swelling or erythema Sinuses: No Frontal/maxillary tenderness ENT/Mouth: Ext aud canals clear, TMs without erythema, bulging. No erythema, swelling, or exudate on post pharynx.  Tonsils not swollen or erythematous. Hearing normal.  Neck: Supple, thyroid normal.  Respiratory: Respiratory effort normal, BS equal bilaterally without rales, rhonchi, wheezing or  stridor.  Cardio: RRR with no MRGs. Brisk peripheral pulses without edema.  Abdomen: Soft, + BS.  Non tender, no guarding, rebound, hernias, masses. Lymphatics: Non tender without lymphadenopathy.  Musculoskeletal: Full ROM, 5/5 strength, normal gait.  Skin: Warm, dry without rashes, lesions, ecchymosis.  Neuro: Cranial nerves intact. Normal muscle tone, no cerebellar symptoms. Sensation intact.  Psych: Awake and oriented X 3, normal affect, Insight and Judgment appropriate.     Magda Bernheim, NP 9:11 AM [facility]

## 2021-05-13 ENCOUNTER — Ambulatory Visit: Payer: Medicare HMO | Admitting: Nurse Practitioner

## 2021-05-15 DIAGNOSIS — L814 Other melanin hyperpigmentation: Secondary | ICD-10-CM | POA: Diagnosis not present

## 2021-05-15 DIAGNOSIS — D040 Carcinoma in situ of skin of lip: Secondary | ICD-10-CM | POA: Diagnosis not present

## 2021-05-15 DIAGNOSIS — C4402 Squamous cell carcinoma of skin of lip: Secondary | ICD-10-CM | POA: Diagnosis not present

## 2021-05-15 DIAGNOSIS — C44212 Basal cell carcinoma of skin of right ear and external auricular canal: Secondary | ICD-10-CM | POA: Diagnosis not present

## 2021-05-15 DIAGNOSIS — Z85828 Personal history of other malignant neoplasm of skin: Secondary | ICD-10-CM | POA: Diagnosis not present

## 2021-05-15 DIAGNOSIS — L570 Actinic keratosis: Secondary | ICD-10-CM | POA: Diagnosis not present

## 2021-05-15 DIAGNOSIS — L821 Other seborrheic keratosis: Secondary | ICD-10-CM | POA: Diagnosis not present

## 2021-05-28 ENCOUNTER — Other Ambulatory Visit: Payer: Self-pay | Admitting: Adult Health Nurse Practitioner

## 2021-05-28 ENCOUNTER — Other Ambulatory Visit: Payer: Self-pay | Admitting: Internal Medicine

## 2021-05-28 DIAGNOSIS — J301 Allergic rhinitis due to pollen: Secondary | ICD-10-CM

## 2021-05-28 MED ORDER — PREDNISONE 5 MG PO TABS: ORAL_TABLET | ORAL | 1 refills | Status: DC

## 2021-05-30 NOTE — Progress Notes (Addendum)
Assessment and Plan: Leslie Cardenas was seen today for follow-up.  Diagnoses and all orders for this visit:  Chronic obstructive pulmonary disease, unspecified COPD type (HCC)/ Dyspnea -     Pulse oximetry, overnight; Future -     Budeson-Glycopyrrol-Formoterol (BREZTRI AEROSPHERE) 160-9-4.8 MCG/ACT AERO; Inhale 2 puffs into the lungs daily. -     Ambulatory referral to Pulmonology - Continue Ventolin as needed - Go to the ER if any chest pain, shortness of breath, nausea, dizziness, severe HA, changes vision/speech   Nasal polyp -     Ambulatory referral to ENT Difficulty breathing through left nostril- exam does appear to show a blockage in left nostril. Will have evaluated at ENT        Further disposition pending results of labs. Discussed med's effects and SE's.   Over 30 minutes of exam, counseling, chart review, and critical decision making was performed.   Future Appointments  Date Time Provider [place]  06/24/2021 11:00 AM Unk Pinto, MD [facility] None  04/13/2022 11:00 AM Magda Bernheim, NP [facility] None    ------------------------------------------------------------------------------------------------------------------   HPI BP 140/82    Pulse 80    Temp 97.9 F (36.6 C)    Wt 134 lb 9.6 oz (61.1 kg)    SpO2 94%    BMI 20.47 kg/m  80 y.o.male presents for walk test for supplemental oxygen   Leslie Cardenas was seen on 06/03/21 for evaluation of oxygen therapy.  He is noting shortness of breath with ambulation .  His symptoms have been worsening.  He is also noting shortness of breath when lying down at night.  Feels like he cannot get a deep breath. Will start to feel panicked and has to sit up.  He is currently on Proventil with nebulizer. He was a long time smoker and quit 1 year ago.   SATURATION QUALIFICATIONS: (This note is used to comply with regulatory documentation for home oxygen)   Patient Saturations on Room Air at Rest = 94 %   Patient  Saturations on Room Air while Ambulating = 92%,    Patient has had moderate dyspnea on exertion.   He does not qualify for supplemental oxygen but does appear to need O2 with sleep .  Will refer for an overnight pulse oximetry.   Past Medical History:  Diagnosis Date   Aortic atherosclerosis ([place]) 03/04/2018   Noted on CT Abd/pelvis   Cataract    Bilateral   Colon polyp    COPD (chronic obstructive pulmonary disease) ([place])    Coronary artery disease    Diverticulosis 07/16/2016   SIGMOID, NOTED ON COLONOSCOPY   Esophageal stenosis 02/24/2018   noted on endoscopy   Esophageal stricture    GERD (gastroesophageal reflux disease)    Grade I diastolic dysfunction 97/98/9211   noted on ECHO   History of hiatal hernia 02/24/2018   Small noted on endoscopy   Hydronephrosis    Hydronephrosis of right kidney 03/04/2018   URETERAL STONE 7 MM, NOTED ONJ CT ABD/PELVIS   Hyperlipidemia    Myocardial infarction [facility])    [id number]   Nephrolithiasis    s/p stent placement   Skin cancer    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 NOTED ON CT ABD/PELVIS   Vitamin D deficiency    Wears dentures    upper     Not on File  Current Outpatient Medications on File Prior to Visit  Medication  Sig   acetaminophen (TYLENOL) 325 MG tablet Take 325 mg by mouth every 6 (six) hours as needed.   albuterol (PROVENTIL) (2.5 MG/3ML) 0.083% nebulizer solution Inhale 1 vial by Nebulizer 4 x /day or every 4 hr to rescue Asthma   albuterol (VENTOLIN HFA) 108 (90 Base) MCG/ACT inhaler Inhale 2 puffs into the lungs every 4 (four) hours as needed for wheezing or shortness of breath. Make sure you rinse mouth out after each use.   Ensure (ENSURE) Take 237 mLs by mouth. Drinks one with his meals----per patient-09/04/2020   ezetimibe (ZETIA) 10 MG tablet Take 1 tablet (10 mg total) by mouth daily.   loratadine (CLARITIN) 10 MG tablet TAKE 1 TABLET BY MOUTH DAILY  FOR ALLERGIES   Multiple Vitamins-Minerals (MULTIVITAMIN WITH MINERALS) tablet Take 1 tablet by mouth daily.   nitroGLYCERIN (NITROSTAT) 0.4 MG SL tablet Place 1 tablet (0.4 mg total) under the tongue every 5 (five) minutes x 3 doses as needed for chest pain.   omeprazole (PRILOSEC) 40 MG capsule TAKE 1 CAPSULE DAILY TO PREVENT HEARTBURN & INDIGESTION   predniSONE (DELTASONE) 5 MG tablet Take  1 tablet 2  x /day as directed   Respiratory Therapy Supplies (NEBULIZER/TUBING/MOUTHPIECE) KIT Disp one nebulizer machine, tubing set and mouthpiece kit for use 4 times daily   rosuvastatin (CRESTOR) 20 MG tablet TAKE 1 TABLET BY MOUTH EVERY DAY FOR CHOLESTEROL   tiZANidine (ZANAFLEX) 4 MG tablet TAKE 1 TABLET 3 X /DAY FOR BACK PAIN & MUSCLE SPASMS   VITAMIN D PO Take 5,000 Units by mouth daily.   No current facility-administered medications on file prior to visit.    ROS: all negative except above.   Physical Exam:  BP 140/82    Pulse 80    Temp 97.9 F (36.6 C)    Wt 134 lb 9.6 oz (61.1 kg)    SpO2 94%    BMI 20.47 kg/m   General Appearance: Well nourished, in no apparent distress. Eyes: PERRLA, EOMs, conjunctiva no swelling or erythema Sinuses: No Frontal/maxillary tenderness ENT/Mouth: Ext aud canals clear, TMs without erythema, bulging. No erythema, swelling, or exudate on post pharynx.  Tonsils not swollen or erythematous. Hearing normal. Left nare appears to have a polyp Neck: Supple, thyroid normal.  Respiratory: Respiratory effort labored, has expiratory wheezing noted throughout the lungs bilaterally Cardio: RRR with no MRGs. Brisk peripheral pulses without edema.  Abdomen: Soft, + BS.  Non tender, no guarding, rebound, hernias, masses. Lymphatics: Non tender without lymphadenopathy.  Musculoskeletal: Full ROM, 5/5 strength, normal gait.  Skin: Warm, dry without rashes, lesions, ecchymosis.  Neuro: Cranial nerves intact. Normal muscle tone, no cerebellar symptoms. Sensation intact.   Psych: Awake and oriented X 3, normal affect, Insight and Judgment appropriate.     Magda Bernheim, NP 3:49 PM [facility]

## 2021-06-02 ENCOUNTER — Other Ambulatory Visit: Payer: Self-pay | Admitting: Internal Medicine

## 2021-06-02 DIAGNOSIS — Z1211 Encounter for screening for malignant neoplasm of colon: Secondary | ICD-10-CM

## 2021-06-02 DIAGNOSIS — Z79899 Other long term (current) drug therapy: Secondary | ICD-10-CM

## 2021-06-02 DIAGNOSIS — I10 Essential (primary) hypertension: Secondary | ICD-10-CM

## 2021-06-03 ENCOUNTER — Other Ambulatory Visit: Payer: Self-pay

## 2021-06-03 ENCOUNTER — Encounter: Payer: Self-pay | Admitting: Nurse Practitioner

## 2021-06-03 ENCOUNTER — Ambulatory Visit (INDEPENDENT_AMBULATORY_CARE_PROVIDER_SITE_OTHER): Payer: Medicare HMO | Admitting: Nurse Practitioner

## 2021-06-03 VITALS — BP 140/82 | HR 80 | Temp 97.9°F | Wt 134.6 lb

## 2021-06-03 DIAGNOSIS — J339 Nasal polyp, unspecified: Secondary | ICD-10-CM | POA: Diagnosis not present

## 2021-06-03 DIAGNOSIS — R0600 Dyspnea, unspecified: Secondary | ICD-10-CM | POA: Diagnosis not present

## 2021-06-03 DIAGNOSIS — R0689 Other abnormalities of breathing: Secondary | ICD-10-CM

## 2021-06-03 DIAGNOSIS — J449 Chronic obstructive pulmonary disease, unspecified: Secondary | ICD-10-CM | POA: Diagnosis not present

## 2021-06-03 MED ORDER — BREZTRI AEROSPHERE 160-9-4.8 MCG/ACT IN AERO: 2.0000 | INHALATION_SPRAY | Freq: Every day | RESPIRATORY_TRACT | 5 refills | Status: DC

## 2021-06-03 NOTE — Patient Instructions (Signed)
Budesonide; Glycopyrrolate; Formoterol inhalation aerosol What is this medication? BUDESONIDE; GLYCOPYRROLATE; FORMOTEROL (byoo DES oh nide; glye koe PYE roe late; for [facility] te rol) inhalation is a combination of 3 medicines. Budesonide decreases inflammation in the lungs. Formoterol and glycopyrrolate are bronchodilators that help keep airways open. This medicine is used to treat chronic obstructive pulmonary disease (COPD). Do not use this drug combination for acute COPD attacks or bronchospasm. This medicine may be used for other purposes; ask your health care provider or pharmacist if you have questions. COMMON BRAND NAME(S): BREZTRI What should I tell my care team before I take this medication? They need to know if you have any of these conditions: bladder problems or trouble passing urine bone problems diabetes eye disease, vision problems heart disease high blood pressure history of irregular heartbeat immune system problems infection kidney disease pheochromocytoma prostate disease seizures thyroid disease an unusual or allergic reaction to budesonide, formoterol, glycopyrrolate, medicines, foods, dyes, or preservatives pregnant or trying to get pregnant breast-feeding How should I use this medication? This medicine is inhaled through the mouth. Follow the directions on the prescription label. Shake well before each use. Rinse your mouth with water after use. Make sure not to swallow the water. Take your medicine at regular intervals. Do not take your medicine more often than directed. Do not stop taking except on your doctor's advice. Make sure that you are using your inhaler correctly. This drug comes with INSTRUCTIONS FOR USE. Ask your pharmacist for directions on how to use this drug. Read the information carefully. Talk to your pharmacist or health care provider if you have questions. Talk to your pediatrician regarding the use of this medicine in children. This medicine is not  approved for use in children. Overdosage: If you think you have taken too much of this medicine contact a poison control center or emergency room at once. NOTE: This medicine is only for you. Do not share this medicine with others. What if I miss a dose? If you miss a dose, use it as soon as you can. If it is almost time for your next dose, use only that dose. Do not use double or extra doses. What may interact with this medication? Do not take the medicine with any of the following medications: cisapride dofetilide dronedarone MAOIs like Marplan, Nardil, and Parnate other medicines that contain long-acting beta-2 agonists (LABAs) like arformoterol, formoterol, indacaterol, olodaterol, salmeterol, vilanterol pimozide procarbazine thioridazine This medicine may also interact with the following medications: antihistamines for allergy, cough, and cold atropine certain antibiotics like clarithromycin, telithromycin certain antivirals for HIV or hepatitis certain heart medicines like atenolol, metoprolol certain medicines for bladder problems like oxybutynin, tolterodine certain medicines for blood pressure, heart disease, irregular heartbeat certain medicines for depression, anxiety, or psychotic disturbances certain medicines for fungal infections like ketoconazole, itraconazole certain medicines for Parkinson's disease like benztropine, trihexyphenidyl certain medicines for stomach problems like dicyclomine, hyoscyamine certain medicines for travel sickness like scopolamine diuretics grapefruit juice mifepristone other inhaled medicines that contain anticholinergics such as aclidinium, ipratropium, tiotropium, umeclidinium other medicines that prolong the QT interval (an abnormal heart rhythm) some vaccines steroid medicines like prednisone or cortisone stimulant medicines for attention disorders, weight loss, or to stay awake theophylline This list may not describe all possible  interactions. Give your health care provider a list of all the medicines, herbs, non-prescription drugs, or dietary supplements you use. Also tell them if you smoke, drink alcohol, or use illegal drugs. Some items may interact with your  medicine. What should I watch for while using this medication? Visit your health care professional for regular checks on your progress. Tell your health care professional if your symptoms do not start to get better or if they get worse. NEVER use this medicine for an acute asthma or COPD attack. You should use your short-acting rescue inhalers for this purpose. If your symptoms get worse or if you need your short-acting inhalers more often, call your doctor right away. This medicine may increase your risk of getting an infection. Tell your doctor or health care professional if you are around anyone with measles or chickenpox, or if you develop sores or blisters that do not heal properly. Do not get this medicine in your eyes. It can cause irritation, pain, or blurred vision. What side effects may I notice from receiving this medication? Side effects that you should report to your doctor or health care professional as soon as possible: allergic reactions like skin rash, itching or hives, swelling of the face, lips, or tongue anxious breathing problems changes in vision, eye pain muscle cramps or muscle pain signs and symptoms of a dangerous change in heartbeat or heart rhythm like chest pain; dizziness; fast or irregular heartbeat; palpitations; feeling faint or lightheaded, falls; breathing problems signs and symptoms of high blood sugar such as being more thirsty or hungry or having to urinate more than normal. You may also feel very tired or have blurry vision signs and symptoms of infection like fever; chills; cough; sore throat; pain or trouble passing urine tremors trouble passing urine or change in the amount of urine unusually weak or tired white patches in  the mouth or mouth sores Side effects that usually do not require medical attention (report these to your doctor or health care professional if they continue or are bothersome): back pain changes in taste cough diarrhea runny or stuffy nose upset stomach This list may not describe all possible side effects. Call your doctor for medical advice about side effects. You may report side effects to FDA at 1-800-FDA-1088. Where should I keep my medication? Keep out of the reach of children. Store in a dry place at room temperature between 15 and 30 degrees C (59 and 86 degrees F). Protect from heat. Do not freeze. Do not use or store near heat or flame, as the canister may burst. Throw away the inhaler 3 months after you open the foil pouch (for the 120-inhalation canister), or 3 weeks after you open the foil pouch (for the 28-inhalation canister), or when the dose indicator reaches zero "0", whichever comes first. NOTE: This sheet is a summary. It may not cover all possible information. If you have questions about this medicine, talk to your doctor, pharmacist, or health care provider.  2022 [employer] Standard (2019-01-09 00:00:00)

## 2021-06-13 DIAGNOSIS — J449 Chronic obstructive pulmonary disease, unspecified: Secondary | ICD-10-CM | POA: Diagnosis not present

## 2021-06-20 DIAGNOSIS — R490 Dysphonia: Secondary | ICD-10-CM | POA: Insufficient documentation

## 2021-06-20 DIAGNOSIS — Z20822 Contact with and (suspected) exposure to covid-19: Secondary | ICD-10-CM | POA: Diagnosis not present

## 2021-06-23 ENCOUNTER — Encounter: Payer: Self-pay | Admitting: Internal Medicine

## 2021-06-23 NOTE — Progress Notes (Signed)
Annual  Screening/Preventative Visit  & Comprehensive Evaluation & Examination  Future Appointments  Date Time Provider Department  06/24/2021 11:00 AM Unk Pinto, MD [facility]  07/04/2021  9:30 AM Maryjane Hurter, MD [facility]  04/13/2022 11:00 AM Magda Bernheim, NP [facility]  06/29/2022 11:00 AM Unk Pinto, MD [facility]            This very nice 81 y.o. DWM presents for a Screening /Preventative Visit & comprehensive evaluation and management of multiple medical co-morbidities.  Patient has been followed for HTN, HLD, COPD,  Prediabetes and Vitamin D Deficiency. CXR in Feb 2020 showed Thoracic Aortic atherosclerosis.  Patient is on Bronchodilator therapy for his COPD.                                                    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        HTN predates since 10. Patient's BP has been controlled at home.  Today's BP was initially sl elevated & rechecked at goal - 138/86.  Patient has hx/o ASCAD with MI (1983). In July 2022, he was briefly hospitalized with a TIA with tingling of the LUE and was advised at discharge to take daily LD 81 mg ASA.  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       Patient's hyperlipidemia is not controlled with diet and Rosuvastatin . Patient denies myalgias or other medication SE's. Last lipids were not at goal :  Lab Results  Component Value Date   CHOL 224 (H) 04/14/2021   HDL 46 04/14/2021   LDLCALC 142 (H) 04/14/2021   TRIG 219 (H) 04/14/2021   CHOLHDL 4.9 04/14/2021         Patient has hx/o prediabetes (A1c 5.9% /2011 & 6.4% /2016) and patient denies reactive hypoglycemic symptoms, visual blurring, diabetic polys or paresthesias. Last A1c was near goal :   Lab Results  Component Value Date   HGBA1C 5.7 (H) 04/14/2021           Finally, patient has history of Vitamin D Deficiency ("24" /2008) and last vitamin D was still low :   Lab Results  Component Value Date   VD25OH 44 12/05/2020     Current Outpatient Medications on File Prior to Visit  Medication Sig   acetaminophen (TYLENOL) 325 MG tablet Take 325 mg every 6 (six) hours as needed.   albuterol (PROVENTIL) (2.5 MG/3ML) 0.083% nebulizer solution Inhale 1 vial by Nebulizer 4 x /day or every 4 hr to rescue Asthma   albuterol HFA inhaler Inhale 2 puffs every 4 hours as needed    BREZTRI 160-9-4.8  Inhale 2 puffs  daily.   ezetimibe (ZETIA) 10 MG tablet Take 1 tablet daily.   loratadine (CLARITIN) 10 MG tablet TAKE 1 TABLET DAILY FOR ALLERGIES   Multiple Vitamins-Minerals  Take 1 tablet daily.   NITROSTAT 0.4 MG SL tablet as needed for chest pain.   omeprazole (PRILOSEC) 40 MG capsule TAKE 1 CAPSULE DAILY    predniSONE 5 MG tablet Take  1 tablet 2  x /day as directed   rosuvastatin (CRESTOR) 20 MG tablet TAKE 1  TABLET EVERY DAY FOR CHOLESTEROL   tiZANidine (ZANAFLEX) 4 MG tablet TAKE 1 TABLET 3 X /DAY FOR BACK PAIN & MUSCLE SPASMS   VITAMIN D 5,000 Units  Take daily.     Past Medical History:  Diagnosis Date   Aortic atherosclerosis ([place]) 03/04/2018   Noted on CT Abd/pelvis   Cataract    Bilateral   Colon polyp    COPD (chronic obstructive pulmonary disease) ([place])    Coronary artery disease    Diverticulosis 07/16/2016   SIGMOID, NOTED ON COLONOSCOPY   Esophageal stenosis 02/24/2018   noted on endoscopy   Esophageal stricture    GERD (gastroesophageal reflux disease)    Grade I diastolic dysfunction 25/42/7062   noted on ECHO   History of hiatal hernia 02/24/2018   Small noted on endoscopy   Hydronephrosis    Hydronephrosis of right kidney 03/04/2018   URETERAL STONE 7 MM, NOTED ONJ CT ABD/PELVIS   Hyperlipidemia    Myocardial infarction [facility])    [id number]   Nephrolithiasis    s/p stent placement   Skin cancer    Traumatic  amputation of right leg ([place])    Ureteral stone 03/04/2018   URETERAL STONE 7 MM. NOTED ON CT ABD/PELVIS   Vitamin D deficiency    Wears dentures    upper     Health Maintenance  Topic Date Due   COVID-19 Vaccine (3 - Moderna risk series) 09/09/2019   Zoster Vaccines- Shingrix (1 of 2) 07/15/2021 (Originally 12/07/1959)   TETANUS/TDAP  10/20/2029   Pneumonia Vaccine 53+ Years old  Completed   INFLUENZA VACCINE  Completed   HPV VACCINES  Aged Out     Immunization History  Administered Date(s) Administered   Influenza Split 04/08/2012   Influenza, High Dose  03/04/2017, 04/14/2021   Influenza,inj,Quad  05/16/2015, 02/15/2018   Moderna Sars-Covid-2 Vacc 07/15/2019, 08/12/2019   Pneumococcal -13 07/25/2014   Pneumococcal -23 04/08/2012   Pneumococcal-23 06/06/2002   Td 06/30/2011   Tdap 10/21/2019   Zoster, Live 05/22/2008    Last Colon - 07/16/2016 - Dr Carlean Purl - Recc No repeat due to age    Past Surgical History:  Procedure Laterality Date   [place]   No intervention performed.    CATARACT EXTRACTION, BILATERAL     COLONOSCOPY     COLONOSCOPY W/ POLYPECTOMY  08/12/2010   Dr. Silvano Rusk   COLONOSCOPY W/ POLYPECTOMY  07/16/2016   CYSTOSCOPY W/ URETERAL STENT PLACEMENT Right 03/04/2018   Procedure: CYSTOSCOPY WITH RETROGRADE PYELOGRAM/URETERAL STENT PLACEMENT;  Surgeon: [name], MD;  Location: [place];  Service: Urology;  Laterality: Right;   CYSTOSCOPY/URETEROSCOPY/HOLMIUM LASER/STENT PLACEMENT Right 03/16/2018   Procedure: CYSTOSCOPY RIGHT URETEROSCOPY/HOLMIUM LASER/STENT PLACEMENT;  Surgeon: [name], MD;  Location: [facility];  Service: Urology;  Laterality: Right;   ESOPHAGOGASTRODUODENOSCOPY     INGUINAL HERNIA REPAIR     left   right leg amputation  05/14/1977   SHOULDER ARTHROSCOPY Left 2000   UPPER GASTROINTESTINAL ENDOSCOPY  WITH DILATION  02/24/2018     Family History  Problem Relation Age of Onset    Hypertension Father    Pancreatic cancer Mother    Cancer Mother    Colon cancer Brother    Leukemia Brother    Prostate cancer Brother    Other Brother        polio   Esophageal cancer Neg Hx    Rectal cancer Neg Hx    Stomach cancer  Neg Hx      Social History   Tobacco Use   Smoking status: Former    Packs/day: 3.00    Years: 20.00    Pack years: 60.00    Types: Cigarettes    Quit date: 04/11/2000    Years since quitting: 21.2   Smokeless tobacco: Never  Substance Use Topics   Alcohol use: Not Currently    Comment: rare   Drug use: No      ROS Constitutional: Denies fever, chills, weight loss/gain, headaches, insomnia,  night sweats or change in appetite. Does c/o fatigue. Eyes: Denies redness, blurred vision, diplopia, discharge, itchy or watery eyes.  ENT: Denies discharge, congestion, post nasal drip, epistaxis, sore throat, earache, hearing loss, dental pain, Tinnitus, Vertigo, Sinus pain or snoring.  Cardio: Denies chest pain, palpitations, irregular heartbeat, syncope, dyspnea, diaphoresis, orthopnea, PND, claudication or edema Respiratory: denies cough, dyspnea, DOE, pleurisy, hoarseness, laryngitis or wheezing.  Gastrointestinal: Denies dysphagia, heartburn, reflux, water brash, pain, cramps, nausea, vomiting, bloating, diarrhea, constipation, hematemesis, melena, hematochezia, jaundice or hemorrhoids Genitourinary: Denies dysuria, frequency, urgency, nocturia, hesitancy, discharge, hematuria or flank pain Musculoskeletal: Denies arthralgia, myalgia, stiffness, Jt. Swelling, pain, limp or strain/sprain. Denies Falls. Skin: Denies puritis, rash, hives, warts, acne, eczema or change in skin lesion Neuro: No weakness, tremor, incoordination, spasms, paresthesia or pain Psychiatric: Denies confusion, memory loss or sensory loss. Denies Depression. Endocrine: Denies change in weight, skin, hair change, nocturia, and paresthesia, diabetic polys, visual blurring or  hyper / hypo glycemic episodes.  Heme/Lymph: No excessive bleeding, bruising or enlarged lymph nodes.   Physical Exam  BP 138/86    Pulse 73    Temp 97.8 F (36.6 C)    Resp 17    Ht 5\' 8"  (1.727 m)    Wt 131 lb 3.2 oz (59.5 kg)    SpO2 96%    BMI 19.95 kg/m   General Appearance: Well nourished and well groomed and in no apparent distress.  Eyes: PERRLA, EOMs, conjunctiva no swelling or erythema, normal fundi and vessels. Sinuses: No frontal/maxillary tenderness ENT/Mouth: EACs patent / TMs  nl. Nares clear without erythema, swelling, mucoid exudates. Oral hygiene is good. No erythema, swelling, or exudate. Tongue normal, non-obstructing. Tonsils not swollen or erythematous. Hearing normal.  Neck: Supple, thyroid not palpable. No bruits, nodes or JVD. Respiratory: Respiratory effort normal.  BS equal and clear bilateral without rales, rhonci, wheezing or stridor. Cardio: Heart sounds are normal with regular rate and rhythm and no murmurs, rubs or gallops. Peripheral pulses are normal and equal bilaterally without edema. No aortic or femoral bruits. Chest: symmetric with normal excursions and percussion.  Abdomen: Soft, with Nl bowel sounds. Nontender, no guarding, rebound, hernias, masses, or organomegaly.  Lymphatics: Non tender without lymphadenopathy.  Musculoskeletal: Rt BKA prosthesis , otherwise FROM, joint stability, 5/5 strength, and normal gait. Skin: Warm and dry without rashes, lesions, cyanosis, clubbing or  ecchymosis.  Neuro: Cranial nerves intact, reflexes equal bilaterally. Normal muscle tone, no cerebellar symptoms. Sensation intact.  Pysch: Alert and oriented X 3 with normal affect, insight and judgment appropriate.   Assessment and Plan  1. Annual Preventative/Screening Exam    2. Essential hypertension  - EKG 12-Lead - [place], RETROPERITNL ABD,  LTD - Urinalysis, Routine w reflex microscopic - Microalbumin / creatinine urine ratio - CBC with  Differential/Platelet - COMPLETE METABOLIC PANEL WITH GFR - Magnesium - TSH  3. Hyperlipidemia, mixed  - EKG 12-Lead - [place], RETROPERITNL ABD,  LTD - Lipid panel -  TSH  4. Abnormal glucose  - EKG 12-Lead - [place], RETROPERITNL ABD,  LTD - Hemoglobin A1c - Insulin, random  5. Vitamin D deficiency  - VITAMIN D 25 Hydroxy  6. Thoracic aortic atherosclerosis ([place]) by CXR on 07/29/2018  - EKG 12-Lead - Lipid panel  7. ASHD (arteriosclerotic heart disease)  - EKG 12-Lead - Lipid panel  8. TIA (transient ischemic attack)  - Lipid panel  9. S/P unilateral BKA (below knee amputation), right ([place])   10. Gastroesophageal reflux disease without esophagitis  - CBC with Differential/Platelet  11. BPH with obstruction/lower urinary tract symptoms  - PSA  12. Prostate cancer screening  - PSA  13. Screening for colorectal cancer  - POC Hemoccult Bld/Stl   14. Screening for heart disease  - EKG 12-Lead  15. FH: hypertension  - EKG 12-Lead - [place], RETROPERITNL ABD,  LTD  16. Former smoker  - EKG 12-Lead - [place], RETROPERITNL ABD,  LTD  17. Screening for AAA (aortic abdominal aneurysm)  - [place], RETROPERITNL ABD,  LTD  18. Medication management  - Urinalysis, Routine w reflex microscopic - Microalbumin / creatinine urine ratio - CBC with Differential/Platelet - COMPLETE METABOLIC PANEL WITH GFR - Magnesium - Lipid panel - TSH - Hemoglobin A1c - Insulin, random - VITAMIN D 25 Hydroxy           Patient was counseled in prudent diet, weight control to achieve/maintain BMI less than 25, BP monitoring, regular exercise and medications as discussed.  Discussed med effects and SE's. Routine screening labs and tests as requested with regular follow-up as recommended. Over 40 minutes of exam, counseling, chart review and high complex critical decision making was performed   Kirtland Bouchard, MD

## 2021-06-23 NOTE — Patient Instructions (Signed)

## 2021-06-24 ENCOUNTER — Other Ambulatory Visit: Payer: Self-pay

## 2021-06-24 ENCOUNTER — Encounter: Payer: Self-pay | Admitting: Internal Medicine

## 2021-06-24 ENCOUNTER — Ambulatory Visit (INDEPENDENT_AMBULATORY_CARE_PROVIDER_SITE_OTHER): Payer: Medicare HMO | Admitting: Internal Medicine

## 2021-06-24 VITALS — BP 138/86 | HR 73 | Temp 97.8°F | Resp 17 | Ht 68.0 in | Wt 131.2 lb

## 2021-06-24 DIAGNOSIS — Z136 Encounter for screening for cardiovascular disorders: Secondary | ICD-10-CM | POA: Diagnosis not present

## 2021-06-24 DIAGNOSIS — N401 Enlarged prostate with lower urinary tract symptoms: Secondary | ICD-10-CM

## 2021-06-24 DIAGNOSIS — I10 Essential (primary) hypertension: Secondary | ICD-10-CM | POA: Diagnosis not present

## 2021-06-24 DIAGNOSIS — G459 Transient cerebral ischemic attack, unspecified: Secondary | ICD-10-CM

## 2021-06-24 DIAGNOSIS — Z0001 Encounter for general adult medical examination with abnormal findings: Secondary | ICD-10-CM

## 2021-06-24 DIAGNOSIS — N138 Other obstructive and reflux uropathy: Secondary | ICD-10-CM

## 2021-06-24 DIAGNOSIS — Z8249 Family history of ischemic heart disease and other diseases of the circulatory system: Secondary | ICD-10-CM

## 2021-06-24 DIAGNOSIS — I700 Atherosclerosis of aorta: Secondary | ICD-10-CM | POA: Diagnosis not present

## 2021-06-24 DIAGNOSIS — Z1211 Encounter for screening for malignant neoplasm of colon: Secondary | ICD-10-CM

## 2021-06-24 DIAGNOSIS — Z0000 Encounter for general adult medical examination without abnormal findings: Secondary | ICD-10-CM

## 2021-06-24 DIAGNOSIS — Z125 Encounter for screening for malignant neoplasm of prostate: Secondary | ICD-10-CM | POA: Diagnosis not present

## 2021-06-24 DIAGNOSIS — E782 Mixed hyperlipidemia: Secondary | ICD-10-CM

## 2021-06-24 DIAGNOSIS — I2510 Atherosclerotic heart disease of native coronary artery without angina pectoris: Secondary | ICD-10-CM

## 2021-06-24 DIAGNOSIS — Z89511 Acquired absence of right leg below knee: Secondary | ICD-10-CM

## 2021-06-24 DIAGNOSIS — E559 Vitamin D deficiency, unspecified: Secondary | ICD-10-CM

## 2021-06-24 DIAGNOSIS — K219 Gastro-esophageal reflux disease without esophagitis: Secondary | ICD-10-CM

## 2021-06-24 DIAGNOSIS — R7309 Other abnormal glucose: Secondary | ICD-10-CM

## 2021-06-24 DIAGNOSIS — Z87891 Personal history of nicotine dependence: Secondary | ICD-10-CM

## 2021-06-24 DIAGNOSIS — Z79899 Other long term (current) drug therapy: Secondary | ICD-10-CM

## 2021-06-25 ENCOUNTER — Other Ambulatory Visit: Payer: Self-pay | Admitting: Internal Medicine

## 2021-06-25 DIAGNOSIS — N3000 Acute cystitis without hematuria: Secondary | ICD-10-CM

## 2021-06-25 LAB — MICROSCOPIC MESSAGE

## 2021-06-25 LAB — CBC WITH DIFFERENTIAL/PLATELET
Absolute Monocytes: 713 cells/uL (ref 200–950)
Basophils Absolute: 98 cells/uL (ref 0–200)
Basophils Relative: 1.3 %
Eosinophils Absolute: 255 cells/uL (ref 15–500)
Eosinophils Relative: 3.4 %
HCT: 40.9 % (ref 38.5–50.0)
Hemoglobin: 13.9 g/dL (ref 13.2–17.1)
Lymphs Abs: 1163 cells/uL (ref 850–3900)
MCH: 32.7 pg (ref 27.0–33.0)
MCHC: 34 g/dL (ref 32.0–36.0)
MCV: 96.2 fL (ref 80.0–100.0)
MPV: 9.1 fL (ref 7.5–12.5)
Monocytes Relative: 9.5 %
Neutro Abs: 5273 cells/uL (ref 1500–7800)
Neutrophils Relative %: 70.3 %
Platelets: 433 10*3/uL — ABNORMAL HIGH (ref 140–400)
RBC: 4.25 10*6/uL (ref 4.20–5.80)
RDW: 13.2 % (ref 11.0–15.0)
Total Lymphocyte: 15.5 %
WBC: 7.5 10*3/uL (ref 3.8–10.8)

## 2021-06-25 LAB — COMPLETE METABOLIC PANEL WITH GFR
AG Ratio: 1.7 (calc) (ref 1.0–2.5)
ALT: 10 U/L (ref 9–46)
AST: 14 U/L (ref 10–35)
Albumin: 4.4 g/dL (ref 3.6–5.1)
Alkaline phosphatase (APISO): 62 U/L (ref 35–144)
BUN/Creatinine Ratio: 11 (calc) (ref 6–22)
BUN: 14 mg/dL (ref 7–25)
CO2: 33 mmol/L — ABNORMAL HIGH (ref 20–32)
Calcium: 10.2 mg/dL (ref 8.6–10.3)
Chloride: 101 mmol/L (ref 98–110)
Creat: 1.27 mg/dL — ABNORMAL HIGH (ref 0.70–1.22)
Globulin: 2.6 g/dL (calc) (ref 1.9–3.7)
Glucose, Bld: 76 mg/dL (ref 65–99)
Potassium: 4.9 mmol/L (ref 3.5–5.3)
Sodium: 140 mmol/L (ref 135–146)
Total Bilirubin: 0.3 mg/dL (ref 0.2–1.2)
Total Protein: 7 g/dL (ref 6.1–8.1)
eGFR: 57 mL/min/{1.73_m2} — ABNORMAL LOW (ref >=60)

## 2021-06-25 LAB — LIPID PANEL
Cholesterol: 229 mg/dL — ABNORMAL HIGH (ref <200)
HDL: 52 mg/dL (ref >=40)
LDL Cholesterol (Calc): 134 mg/dL (calc) — ABNORMAL HIGH
Non-HDL Cholesterol (Calc): 177 mg/dL (calc) — ABNORMAL HIGH (ref <130)
Total CHOL/HDL Ratio: 4.4 (calc) (ref <5.0)
Triglycerides: 277 mg/dL — ABNORMAL HIGH (ref <150)

## 2021-06-25 LAB — VITAMIN D 25 HYDROXY (VIT D DEFICIENCY, FRACTURES): Vit D, 25-Hydroxy: 32 ng/mL (ref 30–100)

## 2021-06-25 LAB — URINALYSIS, ROUTINE W REFLEX MICROSCOPIC
Bilirubin Urine: NEGATIVE
Glucose, UA: NEGATIVE
Hgb urine dipstick: NEGATIVE
Ketones, ur: NEGATIVE
Nitrite: NEGATIVE
Protein, ur: NEGATIVE
RBC / HPF: NONE SEEN /HPF (ref 0–2)
Specific Gravity, Urine: 1.008 (ref 1.001–1.035)
Squamous Epithelial / HPF: NONE SEEN /HPF (ref <=5)
WBC, UA: >=60 /HPF — AB (ref 0–5)
pH: 7 (ref 5.0–8.0)

## 2021-06-25 LAB — HEMOGLOBIN A1C
Hgb A1c MFr Bld: 5.9 % of total Hgb — ABNORMAL HIGH (ref <5.7)
Mean Plasma Glucose: 123 mg/dL
eAG (mmol/L): 6.8 mmol/L

## 2021-06-25 LAB — MICROALBUMIN / CREATININE URINE RATIO
Creatinine, Urine: 41 mg/dL (ref 20–320)
Microalb Creat Ratio: 22 mcg/mg creat (ref <30)
Microalb, Ur: 0.9 mg/dL

## 2021-06-25 LAB — INSULIN, RANDOM: Insulin: 5.4 u[IU]/mL

## 2021-06-25 LAB — PSA: PSA: 1.38 ng/mL (ref <=4.00)

## 2021-06-25 LAB — TSH: TSH: 1.17 mIU/L (ref 0.40–4.50)

## 2021-06-25 LAB — MAGNESIUM: Magnesium: 1.9 mg/dL (ref 1.5–2.5)

## 2021-06-25 NOTE — Progress Notes (Signed)
============================================================ °============================================================ ° °-     U/A suspicious for UTI - Lab will do a Culture                                                    - will take several days to get final report back  ============================================================ ============================================================  -  Total  Chol =      229    - Elevated             (  Ideal  or  Goal is less than 180  !  )   - and   -  Bad / Dangerous LDL  Chol =  134    - also Elevated              (  Ideal  or  Goal is less than 70  !  )     - Cholesterol is  way  too high - Recommend low cholesterol diet   - Cholesterol only comes from animal sources  - ie. meat, dairy, egg yolks  - Eat all the vegetables you want.  - Avoid meat, especially red meat - Beef AND Pork .  - Avoid cheese & dairy - milk & ice cream.     - Cheese is the most concentrated form of trans-fats which  is the worst thing to clog up our arteries.   - Veggie cheese is OK which can be found in the fresh  produce section at [employer] or [employer] or [employer] ============================================================ ============================================================  -   A1c = 5.9% - Slightly elevated sugar,     So    - Avoid Sweets, Candy & White Stuff   - White Rice, White [place], White Flour  - Breads &  Pasta ============================================================ ============================================================  -   PSA - Low - Great  ! ============================================================ ============================================================  -   Vitamin D = 32 - Extremely Low  !   - if taking Vit D 5,000 u /day , then Increase to 2 capsules = 10,000 units /day   ============================================================ ============================================================  -   All Else - CBC - Kidneys - Electrolytes - Liver - Magnesium & Thyroid    - all  Normal / OK ============================================================ ============================================================

## 2021-06-27 LAB — URINE CULTURE
MICRO NUMBER:: 12886861
SPECIMEN QUALITY:: ADEQUATE

## 2021-06-28 ENCOUNTER — Other Ambulatory Visit: Payer: Self-pay | Admitting: Internal Medicine

## 2021-06-28 DIAGNOSIS — N419 Inflammatory disease of prostate, unspecified: Secondary | ICD-10-CM

## 2021-06-28 MED ORDER — SULFAMETHOXAZOLE-TRIMETHOPRIM 800-160 MG PO TABS: ORAL_TABLET | ORAL | 0 refills | Status: DC

## 2021-06-28 NOTE — Progress Notes (Signed)
============================================================ °-   Test results slightly outside the reference range are not unusual. If there is anything important, I will review this with you,  otherwise it is considered normal test values.  If you have further questions,  please do not hesitate to contact me at the office or via My Chart.  ============================================================ ============================================================  -  Urine culture showed a Prostate Infection &                                     Antibiotic for a sulfa drug sent in for 2 x /day for 3 week  - Please schedule a nurse visit in about a month to recheck urine for Infection  ============================================================ ============================================================

## 2021-06-30 DIAGNOSIS — C44212 Basal cell carcinoma of skin of right ear and external auricular canal: Secondary | ICD-10-CM | POA: Diagnosis not present

## 2021-06-30 DIAGNOSIS — Z85828 Personal history of other malignant neoplasm of skin: Secondary | ICD-10-CM | POA: Diagnosis not present

## 2021-06-30 DIAGNOSIS — C4402 Squamous cell carcinoma of skin of lip: Secondary | ICD-10-CM | POA: Diagnosis not present

## 2021-07-01 NOTE — Progress Notes (Signed)
Pt is aware of lab results and recommendations, does not have any questions for provider at this time

## 2021-07-03 NOTE — Progress Notes (Signed)
Synopsis: Referred for COPD by Unk Pinto, MD  Subjective:   PATIENT ID: Alger Memos GENDER: male DOB: 10-11-1940, MRN: [id number]  Chief Complaint  Patient presents with   Pulmonary Consult    Referred by Marta Lamas, NP. Pt seen in the past for COPD by Dr Chase Caller- 2014 last visit. He c/o increased DOE, sometimes winded just walking approx 15 ft. He also c/o wheezing and cough with white sputum.    80yM with history of COPD, CAD, diastolic dysfunction, Esophageal stenosis/stricture, smoking  He uses breztri 2 puffs twice daily and he's figured out that he needs to rinse his mouth out after using it due to hoarseness. He's only been on the inhaler for the last couple of weeks. Hoarseness is a little better now. He thinks it has been helpful for his breathing. Sometimes has cough productive of whitish phlegm in the morning.   He is on chronic prednisone which he thinks is mostly for itchy rash but may be for COPD. I see note from 2016 that he's taking it for dry skin.  He still gets intermittent dysphagia, last esophageal stricture dilation 07/26/20.   Otherwise pertinent review of systems is negative.  No family history of lung cancer, lung disease  Quit smoking 04/11/2000. 60 py smoker.   Past Medical History:  Diagnosis Date   Aortic atherosclerosis ([place]) 03/04/2018   Noted on CT Abd/pelvis   Cataract    Bilateral   Colon polyp    COPD (chronic obstructive pulmonary disease) ([place])    Coronary artery disease    Diverticulosis 07/16/2016   SIGMOID, NOTED ON COLONOSCOPY   Esophageal stenosis 02/24/2018   noted on endoscopy   Esophageal stricture    GERD (gastroesophageal reflux disease)    Grade I diastolic dysfunction 87/86/7672   noted on ECHO   History of hiatal hernia 02/24/2018   Small noted on endoscopy   Hydronephrosis    Hydronephrosis of right kidney 03/04/2018   URETERAL STONE 7 MM, NOTED ONJ CT ABD/PELVIS   Hyperlipidemia    Myocardial infarction  [facility])    [id number]   Nephrolithiasis    s/p stent placement   Skin cancer    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)    Ureteral stone 03/04/2018   URETERAL STONE 7 MM. NOTED ON CT ABD/PELVIS   Vitamin D deficiency    Wears dentures    upper     Family History  Problem Relation Age of Onset   Hypertension Father    Pancreatic cancer Mother    Cancer Mother    Colon cancer Brother    Leukemia Brother    Prostate cancer Brother    Other Brother        polio   Esophageal cancer Neg Hx    Rectal cancer Neg Hx    Stomach cancer Neg Hx      Past Surgical History:  Procedure Laterality Date   CARDIAC CATHETERIZATION  1983   No intervention performed.    CATARACT EXTRACTION, BILATERAL     COLONOSCOPY     COLONOSCOPY W/ POLYPECTOMY  08/12/2010   Dr. Silvano Rusk   COLONOSCOPY W/ POLYPECTOMY  07/16/2016   CYSTOSCOPY W/ URETERAL STENT PLACEMENT Right 03/04/2018   Procedure: CYSTOSCOPY WITH RETROGRADE PYELOGRAM/URETERAL STENT PLACEMENT;  Surgeon: [name], MD;  Location: [place];  Service: Urology;  Laterality: Right;   CYSTOSCOPY/URETEROSCOPY/HOLMIUM LASER/STENT PLACEMENT Right 03/16/2018   Procedure: CYSTOSCOPY RIGHT URETEROSCOPY/HOLMIUM LASER/STENT PLACEMENT;  Surgeon: Marton Redwood III,  MD;  Location: [place];  Service: Urology;  Laterality: Right;   ESOPHAGOGASTRODUODENOSCOPY     INGUINAL HERNIA REPAIR     left   right leg amputation  05/14/1977   SHOULDER ARTHROSCOPY Left 2000   UPPER GASTROINTESTINAL ENDOSCOPY  WITH DILATION  02/24/2018    Social History   Socioeconomic History   Marital status: Divorced    Spouse name: Not on file   Number of children: 4   Years of education: Not on file   Highest education level: Not on file  Occupational History   Occupation: retired    Fish farm manager: RETIRED  Tobacco Use   Smoking status: Former    Packs/day: 3.00    Years: 20.00    Pack years: 60.00    Types: Cigarettes    Quit date: 04/11/2000     Years since quitting: 21.2   Smokeless tobacco: Never  Vaping Use   Vaping Use: Never used  Substance and Sexual Activity   Alcohol use: Not Currently    Comment: rare   Drug use: No   Sexual activity: Not Currently  Other Topics Concern   Not on file  [place] 4 yrs + 2 reserve   [employer] 62-65 then to [employer]   Has 4 children he is divorced   Former smoker rare alcohol no drugs   Lives in [place], [place].    Social Determinants of Health   Financial Resource Strain: Not on file  Food Insecurity: Not on file  Transportation Needs: Not on file  Physical Activity: Not on file  Stress: Not on file  Social Connections: Not on file  Intimate Partner Violence: Not on file     No Known Allergies   Outpatient Medications Prior to Visit  Medication Sig Dispense Refill   acetaminophen (TYLENOL) 325 MG tablet Take 325 mg by mouth every 6 (six) hours as needed.     albuterol (PROVENTIL) (2.5 MG/3ML) 0.083% nebulizer solution Inhale 1 vial by Nebulizer 4 x /day or every 4 hr to rescue Asthma 1350 mL 3   albuterol (VENTOLIN HFA) 108 (90 Base) MCG/ACT inhaler Inhale 2 puffs into the lungs every 4 (four) hours as needed for wheezing or shortness of breath. Make sure you rinse mouth out after each use. 18 g 2   Budeson-Glycopyrrol-Formoterol (BREZTRI AEROSPHERE) 160-9-4.8 MCG/ACT AERO Inhale 2 puffs into the lungs daily. 5.9 g 5   ezetimibe (ZETIA) 10 MG tablet Take 1 tablet (10 mg total) by mouth daily. 90 tablet 1   loratadine (CLARITIN) 10 MG tablet TAKE 1 TABLET BY MOUTH DAILY FOR ALLERGIES 90 tablet 2   Multiple Vitamins-Minerals (MULTIVITAMIN WITH MINERALS) tablet Take 1 tablet by mouth daily.     nitroGLYCERIN (NITROSTAT) 0.4 MG SL tablet Place 1 tablet (0.4 mg total) under the tongue every 5 (five) minutes x 3 doses as needed for chest pain. 25 tablet 4   predniSONE (DELTASONE) 5 MG tablet Take  1 tablet 2  x /day as directed 180 tablet 1    Respiratory Therapy Supplies (NEBULIZER/TUBING/MOUTHPIECE) KIT Disp one nebulizer machine, tubing set and mouthpiece kit for use 4 times daily 1 kit 0   rosuvastatin (CRESTOR) 20 MG tablet TAKE 1 TABLET BY MOUTH EVERY DAY FOR CHOLESTEROL 90 tablet 1   sulfamethoxazole-trimethoprim (BACTRIM DS) 800-160 MG tablet Take 1 tablet 2 x /day with Meals for 3 weeks for Prostate  Infection 42 tablet 0   VITAMIN D  PO Take 5,000 Units by mouth daily.     No facility-administered medications prior to visit.       Objective:   Physical Exam:  General appearance: 81 y.o., male, NAD, conversant, thin Eyes: anicteric sclerae; PERRL, tracking appropriately HENT: NCAT; MMM Neck: Trachea midline; no lymphadenopathy, no JVD Lungs: Diminished bilaterally, no crackles, no wheeze, with normal respiratory effort CV: RRR, no murmur  Abdomen: Soft, non-tender; non-distended, BS present  Extremities: No peripheral edema, warm Skin: Normal turgor and texture; no rash Psych: Appropriate affect Neuro: Alert and oriented to person and place, no focal deficit     Vitals:   07/04/21 0931  BP: 128/74  Pulse: 73  Temp: 98 F (36.7 C)  TempSrc: Oral  SpO2: 93%  Weight: 129 lb 12.8 oz (58.9 kg)  Height: _0  (1.727 m)   93% on RA BMI Readings from Last 3 Encounters:  07/04/21 19.74 kg/m  06/24/21 19.95 kg/m  06/03/21 20.47 kg/m   Wt Readings from Last 3 Encounters:  07/04/21 129 lb 12.8 oz (58.9 kg)  06/24/21 131 lb 3.2 oz (59.5 kg)  06/03/21 134 lb 9.6 oz (61.1 kg)     CBC    Component Value Date/Time   WBC 7.5 06/24/2021 1058   RBC 4.25 06/24/2021 1058   HGB 13.9 06/24/2021 1058   HCT 40.9 06/24/2021 1058   PLT 433 (H) 06/24/2021 1058   MCV 96.2 06/24/2021 1058   MCH 32.7 06/24/2021 1058   MCHC 34.0 06/24/2021 1058   RDW 13.2 06/24/2021 1058   LYMPHSABS 1,163 06/24/2021 1058   MONOABS 0.7 12/21/2020 1546   EOSABS 255 06/24/2021 1058   BASOSABS 98 06/24/2021 1058    Eos 100-200  typically  Chest Imaging: LDCT CHest 2016 reviewed by me with diffuse bronchial wall thickening, emphysema  CXR 2020 reviewed by me clear lungs  Pulmonary Functions Testing Results: No flowsheet data found.  PFT 2014 reviewed by me with severe obstruction, moderately reduced dlco, ++BD response  ONO 06/11/21 saturation low was 88%   Echocardiogram:   TTE 2022 with mildly elevated RVSP      Assessment & Plan:   # COPD on long term oral steroids: Unclear to patient and from documentation what steroids are for - whether itchy rash or for COPD or both. Hasn't tried to taper off.   Plan: - new prednisone prescription for taper:   - take 7.5 mg prednisone for 2 weeks, then 5 mg daily until follow up with [place]  - if doing ok at next visit would try to taper by 1 mg weekly until off  - prescription for claritin 10 mg daily as needed for onset of itchy rash. Encouraged him to reach out to his dermatologist if he develops itchy rash. - Breztri 2 puffs twice daily with spacer, instructed in ideal use, rinse mouth afterward - walk for oxygen next visit - discuss pulmonary rehab next visit  - consider dexa next visit     Maryjane Hurter, MD [facility] 07/04/2021 9:36 AM

## 2021-07-04 ENCOUNTER — Other Ambulatory Visit: Payer: Self-pay

## 2021-07-04 ENCOUNTER — Ambulatory Visit: Payer: Medicare HMO | Admitting: Student

## 2021-07-04 ENCOUNTER — Encounter: Payer: Self-pay | Admitting: Student

## 2021-07-04 VITALS — BP 128/74 | HR 73 | Temp 98.0°F | Ht 68.0 in | Wt 129.8 lb

## 2021-07-04 DIAGNOSIS — Z7952 Long term (current) use of systemic steroids: Secondary | ICD-10-CM

## 2021-07-04 DIAGNOSIS — J449 Chronic obstructive pulmonary disease, unspecified: Secondary | ICD-10-CM

## 2021-07-04 DIAGNOSIS — J301 Allergic rhinitis due to pollen: Secondary | ICD-10-CM

## 2021-07-04 MED ORDER — PREDNISONE 2.5 MG PO TABS: ORAL_TABLET | ORAL | 0 refills | Status: DC

## 2021-07-04 MED ORDER — LORATADINE 10 MG PO TABS: ORAL_TABLET | ORAL | 2 refills | Status: DC

## 2021-07-04 MED ORDER — SPACER/AERO-HOLDING CHAMBERS DEVI: 2.0000 | Freq: Two times a day (BID) | 0 refills | Status: DC

## 2021-07-04 NOTE — Patient Instructions (Addendum)
-   STOP taking old prednisone prescription - START Prednisone taper: 3 tablets daily (7.5 mg prednisone total) for 2 weeks THEN decrease to 2 tablets daily (5 mg prednisone total) until you see [place] again - START using breztri 2 puffs twice daily WITH spacer, rinse mouth and spacer after use  - See you in 4-6 weeks!

## 2021-07-16 ENCOUNTER — Encounter: Payer: Self-pay | Admitting: Internal Medicine

## 2021-07-21 ENCOUNTER — Encounter: Payer: Self-pay | Admitting: Internal Medicine

## 2021-07-31 ENCOUNTER — Other Ambulatory Visit: Payer: Self-pay

## 2021-07-31 ENCOUNTER — Ambulatory Visit (INDEPENDENT_AMBULATORY_CARE_PROVIDER_SITE_OTHER): Payer: Medicare HMO

## 2021-07-31 DIAGNOSIS — N419 Inflammatory disease of prostate, unspecified: Secondary | ICD-10-CM

## 2021-07-31 NOTE — Progress Notes (Signed)
Nurse visit to recheck urine. Patient said he has no symptoms and is feeling good.

## 2021-07-31 NOTE — Progress Notes (Signed)
Synopsis: Referred for COPD by Unk Pinto, MD  Subjective:   PATIENT ID: Leslie Cardenas GENDER: male DOB: 03/05/1941, MRN: [id number]  Chief Complaint  Patient presents with   Follow-up    Breathing is about the same since the last visit. He has occ cough with clear sputum. He stopped pred approx 2 wks ago bc he felt he did not need it. Using albuterol neb twice daily. Currently on Bactrim for prostate infection.    80yM with history of COPD, CAD, diastolic dysfunction, Esophageal stenosis/stricture, smoking  He uses breztri 2 puffs twice daily and he's figured out that he needs to rinse his mouth out after using it due to hoarseness. He's only been on the inhaler for the last couple of weeks. Hoarseness is a little better now. He thinks it has been helpful for his breathing. Sometimes has cough productive of whitish phlegm in the morning.   He is on chronic prednisone which he thinks is mostly for itchy rash but may be for COPD. I see note from 2016 that he's taking it for dry skin.  He still gets intermittent dysphagia, last esophageal stricture dilation 07/26/20.   No family history of lung cancer, lung disease  Quit smoking 04/11/2000. 60 py smoker.   Interval HPI: He has actually stopped prednisone and has been off for about a couple weeks. He says he feels no different, hasn't had itchy rash. He has no lightheadedness/dizziness, diarrhea. He just paces himself to deal with DOE but it's unchanged since last visit. Minor cough.   Has some swelling/flap sensation in left nostril which improves when he pulls nostril open. Has had sinus surgery before.  Otherwise pertinent review of systems is negative.   Past Medical History:  Diagnosis Date   Aortic atherosclerosis ([place]) 03/04/2018   Noted on CT Abd/pelvis   Cataract    Bilateral   Colon polyp    COPD (chronic obstructive pulmonary disease) ([place])    Coronary artery disease    Diverticulosis 07/16/2016   SIGMOID,  NOTED ON COLONOSCOPY   Esophageal stenosis 02/24/2018   noted on endoscopy   Esophageal stricture    GERD (gastroesophageal reflux disease)    Grade I diastolic dysfunction 17/49/4496   noted on ECHO   History of hiatal hernia 02/24/2018   Small noted on endoscopy   Hydronephrosis    Hydronephrosis of right kidney 03/04/2018   URETERAL STONE 7 MM, NOTED ONJ CT ABD/PELVIS   Hyperlipidemia    Myocardial infarction [facility])    [id number]   Nephrolithiasis    s/p stent placement   Skin cancer    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 NOTED ON CT ABD/PELVIS   Vitamin D deficiency    Wears dentures    upper     Family History  Problem Relation Age of Onset   Hypertension Father    Pancreatic cancer Mother    Cancer Mother    Colon cancer Brother    Leukemia Brother    Prostate cancer Brother    Other Brother        polio   Esophageal cancer Neg Hx    Rectal cancer Neg Hx    Stomach cancer Neg Hx      Past Surgical History:  Procedure Laterality Date   CARDIAC CATHETERIZATION  1983   No intervention performed.    CATARACT EXTRACTION, BILATERAL     COLONOSCOPY  COLONOSCOPY W/ POLYPECTOMY  08/12/2010   Dr. Silvano Rusk   COLONOSCOPY W/ POLYPECTOMY  07/16/2016   CYSTOSCOPY W/ URETERAL STENT PLACEMENT Right 03/04/2018   Procedure: CYSTOSCOPY WITH RETROGRADE PYELOGRAM/URETERAL STENT PLACEMENT;  Surgeon: [name], MD;  Location: [facility];  Service: Urology;  Laterality: Right;   CYSTOSCOPY/URETEROSCOPY/HOLMIUM LASER/STENT PLACEMENT Right 03/16/2018   Procedure: CYSTOSCOPY RIGHT URETEROSCOPY/HOLMIUM LASER/STENT PLACEMENT;  Surgeon: [name], MD;  Location: [facility];  Service: Urology;  Laterality: Right;   ESOPHAGOGASTRODUODENOSCOPY     INGUINAL HERNIA REPAIR     left   right leg amputation  05/14/1977   SHOULDER ARTHROSCOPY Left 2000   UPPER GASTROINTESTINAL ENDOSCOPY  WITH DILATION   02/24/2018    Social History   Socioeconomic History   Marital status: Divorced    Spouse name: Not on file   Number of children: 4   Years of education: Not on file   Highest education level: Not on file  Occupational History   Occupation: retired    Fish farm manager: RETIRED  Tobacco Use   Smoking status: Former    Packs/day: 3.00    Years: 20.00    Pack years: 60.00    Types: Cigarettes    Quit date: 04/11/2000    Years since quitting: 21.3   Smokeless tobacco: Never  Vaping Use   Vaping Use: Never used  Substance and Sexual Activity   Alcohol use: Not Currently    Comment: rare   Drug use: No   Sexual activity: Not Currently  Other Topics Concern   Not on file  [place] 4 yrs + 2 reserve   [employer] 62-65 then to [employer]   Has 4 children he is divorced   Former smoker rare alcohol no drugs   Lives in [place], [place].    Social Determinants of Health   Financial Resource Strain: Not on file  Food Insecurity: Not on file  Transportation Needs: Not on file  Physical Activity: Not on file  Stress: Not on file  Social Connections: Not on file  Intimate Partner Violence: Not on file     No Known Allergies   Outpatient Medications Prior to Visit  Medication Sig Dispense Refill   acetaminophen (TYLENOL) 325 MG tablet Take 325 mg by mouth every 6 (six) hours as needed.     albuterol (PROVENTIL) (2.5 MG/3ML) 0.083% nebulizer solution Inhale 1 vial by Nebulizer 4 x /day or every 4 hr to rescue Asthma 1350 mL 3   albuterol (VENTOLIN HFA) 108 (90 Base) MCG/ACT inhaler Inhale 2 puffs into the lungs every 4 (four) hours as needed for wheezing or shortness of breath. Make sure you rinse mouth out after each use. 18 g 2   ezetimibe (ZETIA) 10 MG tablet Take 1 tablet (10 mg total) by mouth daily. 90 tablet 1   Multiple Vitamins-Minerals (MULTIVITAMIN WITH MINERALS) tablet Take 1 tablet by mouth daily.     nitroGLYCERIN (NITROSTAT) 0.4 MG SL tablet  Place 1 tablet (0.4 mg total) under the tongue every 5 (five) minutes x 3 doses as needed for chest pain. 25 tablet 4   Respiratory Therapy Supplies (NEBULIZER/TUBING/MOUTHPIECE) KIT Disp one nebulizer machine, tubing set and mouthpiece kit for use 4 times daily 1 kit 0   rosuvastatin (CRESTOR) 20 MG tablet TAKE 1 TABLET BY MOUTH EVERY DAY FOR CHOLESTEROL 90 tablet 1   Spacer/Aero-Holding Chambers DEVI 2 puffs by Does not apply route 2 (  two) times daily. Please use with MDI inhalers 1 each 0   sulfamethoxazole-trimethoprim (BACTRIM DS) 800-160 MG tablet Take 1 tablet 2 x /day with Meals for 3 weeks for Prostate  Infection 42 tablet 0   VITAMIN D PO Take 5,000 Units by mouth daily.     loratadine (CLARITIN) 10 MG tablet Take one pill once daily as needed for itching 90 tablet 2   predniSONE (DELTASONE) 2.5 MG tablet Take 3 tablets daily for 2 weeks, then 2 tablets daily until you see [place] again (Patient not taking: Reported on 08/01/2021) 98 tablet 0   Budeson-Glycopyrrol-Formoterol (BREZTRI AEROSPHERE) 160-9-4.8 MCG/ACT AERO Inhale 2 puffs into the lungs daily. 5.9 g 5   No facility-administered medications prior to visit.       Objective:   Physical Exam:  General appearance: 81 y.o., male, NAD, conversant, thin Eyes: anicteric sclerae; PERRL, tracking appropriately HENT: NCAT; MMM. Deviated septum with narrow passage on left.  Neck: Trachea midline; no lymphadenopathy, no JVD Lungs: Diminished bilaterally, no crackles, no wheeze, with normal respiratory effort CV: brady, RR, no murmur  Abdomen: Soft, non-tender; non-distended, BS present  Extremities: No peripheral edema, warm Skin: Normal turgor and texture; no rash Psych: Appropriate affect Neuro: Alert and oriented to person and place, no focal deficit     Vitals:   08/01/21 0921  BP: 118/74  Pulse: (!) 55  Temp: 98 F (36.7 C)  TempSrc: Oral  SpO2: 96%  Weight: 136 lb 6.4 oz (61.9 kg)  Height: 5' 8"  (1.727 m)    96%  on RA BMI Readings from Last 3 Encounters:  08/01/21 20.74 kg/m  07/31/21 20.62 kg/m  07/04/21 19.74 kg/m   Wt Readings from Last 3 Encounters:  08/01/21 136 lb 6.4 oz (61.9 kg)  07/31/21 135 lb 9.6 oz (61.5 kg)  07/04/21 129 lb 12.8 oz (58.9 kg)     CBC    Component Value Date/Time   WBC 7.5 06/24/2021 1058   RBC 4.25 06/24/2021 1058   HGB 13.9 06/24/2021 1058   HCT 40.9 06/24/2021 1058   PLT 433 (H) 06/24/2021 1058   MCV 96.2 06/24/2021 1058   MCH 32.7 06/24/2021 1058   MCHC 34.0 06/24/2021 1058   RDW 13.2 06/24/2021 1058   LYMPHSABS 1,163 06/24/2021 1058   MONOABS 0.7 12/21/2020 1546   EOSABS 255 06/24/2021 1058   BASOSABS 98 06/24/2021 1058    Eos 100-200 typically  Chest Imaging: LDCT CHest 2016 reviewed by me with diffuse bronchial wall thickening, emphysema  CXR 2020 reviewed by me clear lungs  Pulmonary Functions Testing Results: No flowsheet data found.  PFT 2014 reviewed by me with severe obstruction, moderately reduced dlco, ++BD response  ONO 06/11/21 saturation low was 88%   Echocardiogram:   TTE 2022 with mildly elevated RVSP      Assessment & Plan:   # COPD on long term oral steroids: Tapered himself off long term steroids! Doing well today on breztri.  # Rhinitis # Deviated septum # History of sinus surgery  Plan: - Breztri 2 puffs twice daily with spacer, reviewed in ideal use, rinse mouth afterward - pulmonary rehab referral placed today - bone density scan ordered today - flonase, zyrtec for rhinitis, if unimproved then refer to ENT  RTC 6 months  Maryjane Hurter, MD [facility] 08/01/2021 9:50 AM

## 2021-08-01 ENCOUNTER — Ambulatory Visit: Payer: Medicare HMO | Admitting: Student

## 2021-08-01 ENCOUNTER — Encounter: Payer: Self-pay | Admitting: Student

## 2021-08-01 VITALS — BP 118/74 | HR 55 | Temp 98.0°F | Ht 68.0 in | Wt 136.4 lb

## 2021-08-01 DIAGNOSIS — J449 Chronic obstructive pulmonary disease, unspecified: Secondary | ICD-10-CM | POA: Diagnosis not present

## 2021-08-01 DIAGNOSIS — J309 Allergic rhinitis, unspecified: Secondary | ICD-10-CM | POA: Diagnosis not present

## 2021-08-01 DIAGNOSIS — Z7952 Long term (current) use of systemic steroids: Secondary | ICD-10-CM

## 2021-08-01 DIAGNOSIS — J342 Deviated nasal septum: Secondary | ICD-10-CM | POA: Diagnosis not present

## 2021-08-01 LAB — URINALYSIS, ROUTINE W REFLEX MICROSCOPIC
Bilirubin Urine: NEGATIVE
Glucose, UA: NEGATIVE
Hgb urine dipstick: NEGATIVE
Ketones, ur: NEGATIVE
Leukocytes,Ua: NEGATIVE
Nitrite: NEGATIVE
Protein, ur: NEGATIVE
Specific Gravity, Urine: 1.021 (ref 1.001–1.035)
pH: 5.5 (ref 5.0–8.0)

## 2021-08-01 LAB — URINE CULTURE
MICRO NUMBER:: 13049226
Result:: NO GROWTH
SPECIMEN QUALITY:: ADEQUATE

## 2021-08-01 MED ORDER — FLUTICASONE PROPIONATE 50 MCG/ACT NA SUSP: 1.0000 | Freq: Every day | NASAL | 3 refills | Status: DC

## 2021-08-01 MED ORDER — CETIRIZINE HCL 10 MG PO TABS: 10.0000 mg | ORAL_TABLET | Freq: Every day | ORAL | 1 refills | Status: DC

## 2021-08-01 NOTE — Patient Instructions (Signed)
-   Take shower, clear your nose out of crusting and then flonase 1 spray each nostril  - Zyrtec 10 mg daily - Keep using breztri inhaler as you are - pulmonary rehab referral made today, you will be called to schedule - you will be called to schedule bone density scan - see you in  6 months!!

## 2021-08-02 NOTE — Progress Notes (Signed)
============================================================== °=============================================================== ° °-    U/C - is OK - No Infection   <><><><><><><><><><><><><><><><><><><><><><><><><><><><><><><><><> <><><><><><><><><><><><><><><><><><><><><><><><><><><><><><><><><> 

## 2021-08-07 ENCOUNTER — Other Ambulatory Visit: Payer: Self-pay | Admitting: Internal Medicine

## 2021-08-11 ENCOUNTER — Encounter (HOSPITAL_COMMUNITY): Payer: Self-pay | Admitting: *Deleted

## 2021-08-11 NOTE — Progress Notes (Signed)
Received referral from Dr. Verlee Monte for this pt to participate in pulmonary rehab with the diagnosis of COPD Stage 3.  Pt with full PFT in 2014 which showed FEV1/FVC 46 and post BD FEV1 48.. Clinical review of pt follow up appt on 08/01/21 Pulmonary office note.  Pt with Covid Risk Score - 7. Pt appropriate for scheduling for Pulmonary rehab.  Will forward to support staff for scheduling and verification of insurance eligibility/benefits with pt consent. Cherre Huger, BSN ?Cardiac and Pulmonary Rehab Nurse Navigator  ? ?

## 2021-08-26 ENCOUNTER — Telehealth: Payer: Self-pay | Admitting: *Deleted

## 2021-08-26 NOTE — Telephone Encounter (Signed)
Patient presented to the office stating that he had 3pm appointment with Dr. Verlee Monte regarding his nose and that someone had called him and told him to come in to our address.  He was advised by the front staff that we did not show any appointment for him at our office and according to the notes, he was not due for f/u for 6 months.  I advised the patient that I would check with Dr. Verlee Monte and let him know the outcome.  He verbalized understanding.  He left the office prior to me hearing back from Dr. Verlee Monte and stated we could call him.  I called and spoke with patient regarding f/u with Dr. Verlee Monte.  I advised him that he was not due to f/u with Dr. Verlee Monte for 6 months.  I advised that if they call back to find out what physician he is to see and from what office.  He verbalized understanding.  Nothing further needed. ?

## 2021-09-30 ENCOUNTER — Encounter: Payer: Self-pay | Admitting: Nurse Practitioner

## 2021-09-30 ENCOUNTER — Ambulatory Visit (INDEPENDENT_AMBULATORY_CARE_PROVIDER_SITE_OTHER): Payer: Medicare HMO | Admitting: Nurse Practitioner

## 2021-09-30 VITALS — BP 148/82 | HR 83 | Temp 97.5°F | Wt 140.0 lb

## 2021-09-30 DIAGNOSIS — E559 Vitamin D deficiency, unspecified: Secondary | ICD-10-CM

## 2021-09-30 DIAGNOSIS — N1831 Chronic kidney disease, stage 3a: Secondary | ICD-10-CM | POA: Diagnosis not present

## 2021-09-30 DIAGNOSIS — Z89511 Acquired absence of right leg below knee: Secondary | ICD-10-CM

## 2021-09-30 DIAGNOSIS — I2510 Atherosclerotic heart disease of native coronary artery without angina pectoris: Secondary | ICD-10-CM

## 2021-09-30 DIAGNOSIS — I700 Atherosclerosis of aorta: Secondary | ICD-10-CM | POA: Diagnosis not present

## 2021-09-30 DIAGNOSIS — J449 Chronic obstructive pulmonary disease, unspecified: Secondary | ICD-10-CM

## 2021-09-30 DIAGNOSIS — R7303 Prediabetes: Secondary | ICD-10-CM

## 2021-09-30 DIAGNOSIS — Z8249 Family history of ischemic heart disease and other diseases of the circulatory system: Secondary | ICD-10-CM

## 2021-09-30 DIAGNOSIS — E782 Mixed hyperlipidemia: Secondary | ICD-10-CM | POA: Diagnosis not present

## 2021-09-30 NOTE — Progress Notes (Signed)
?FOLLOW UP ? ?Assessment and Plan:  ? ?FH: hypertension ?Controlled without medications. ?DASH diet, incorporate fruits, vegetables ?Limit red meat and salt.  ?Report to ER for any increase in stroke like symptoms, including HA, N/V, paralysis, difficulty speaking, trouble walking, confusion, vision changes, CP, heart palpitations, SOB, diaphoresis.  ? ?- CBC with Differential/Platelet ?- COMPLETE METABOLIC PANEL WITH GFR ? ?ASHD hx/o MI ?Cholesterol no at goal. ?Continue Rosuvastatin. ? ?- Lipid Panel ? ?Prediabetes ?Continue lifestyle modifications. ?Monitor intake of carbohydrates/sugars. ?Continue to stay active. ? ?- Urinalysis w microscopic + reflex cultur ? ?Stage 3a chronic kidney disease ([place]) ?Avoid NSADSs, nephrotoxic foods that are processed and have a hight sodium content. ?Stay well hydrated.  ? ?- CMP ? ?Hx of right BKA ([place]) ?Continue to stay active.   ? ?Vitamin D deficiency ?Not at goal ?Continue Cholecalciferol ?Discussed foods rich in Vitamin D including salmon, tuna. ? ?- VITAMIN D 25 Hydroxy (Vit-D Deficiency, Fractures) ? ?Hyperlipidemia, mixed ?Continue Rosuvastatin ? ?- Lipid panel ? ?Thoracic aortic atherosclerosis ([place]) by CXR on 07/29/2018 ? Continue Rosuvastatin ? ?- Lipid panel ? ?Chronic obstructive pulmonary disease, unspecified COPD type ([place]) ?Pulmonary on board ?Has not been attending Pulmnary Rehab ?Continue nebulizer and inhalers ?Continue Prednisone 2.5 mg ? ? ?Continue diet and meds as discussed. Further disposition pending results of labs. Discussed med's effects and SE's.   ?Over 30 minutes of exam, counseling, chart review, and critical decision making was performed.  ? ?Future Appointments  ?Date Time Provider [place]  ?09/30/2021  3:30 PM Darrol Jump, NP [facility] None  ?04/13/2022 11:00 AM Magda Bernheim, NP [facility] None  ?06/29/2022 11:00 AM Unk Pinto, MD [facility] None   ? ? ?---------------------------------------------------------------------------------------------------------------------- ? ?HPI ?81 y.o. male  presents for 3 month follow up on hypertension, cholesterol, diabetes, weight and vitamin D deficiency.  ? ?During last OV (06/2021) he was treated for prostatitis with Bactrim.  He reports having no symptoms at the time but did finish the course of abx tmt.  He denies any symptoms today including difficultly urinating, dysuria, hesitance, frequency.   ? ?He follows closely with Pulmonology for COPD.  Has nebulizer's and maintenance inhalers that he uses daily.  He is not attending pulmonary rehab. ? ?Reports medication compliance with all medications.   ? 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   ? ?BMI is Body mass index is 21.29 kg/m?., he has been working on diet and exercise.  He has gained a total of 4 lb in 3 mo. ?Wt Readings from Last 3 Encounters:  ?09/30/21 140 lb (63.5 kg)  ?08/01/21 136 lb 6.4 oz (61.9 kg)  ?07/31/21 135 lb 9.6 oz (61.5 kg)  ? ? ?His blood pressure has been controlled at home, today their BP is BP: (!) 148/82 ? He  occassionally does a  workout. He denies chest pain, shortness of breath, dizziness. ? ? He is on cholesterol medication Rosuvastatin and denies myalgias. His cholesterol is not at goal. The cholesterol last visit was:   ?Lab Results  ?Component Value Date  ? CHOL 229 (H) 06/24/2021  ? HDL 52 06/24/2021  ? LDLCALC 134 (H) 06/24/2021  ? TRIG 277 (H) 06/24/2021  ? CHOLHDL 4.4 06/24/2021  ? ? He has been working on diet and exercise for prediabetes, and denies foot ulcerations, increased appetite, nausea, polydipsia, and polyuria. Last A1C in the office was:  ?Lab Results  ?Component Value Date  ? HGBA1C 5.9 (  H) 06/24/2021  ? ?Patient is on Vitamin D supplement.   ?Lab Results  ?Component Value Date  ? VD25OH 32 06/24/2021  ?   ? ? ? ?Current Medications:  ?Current Outpatient  Medications on File Prior to Visit  ?Medication Sig  ? acetaminophen (TYLENOL) 325 MG tablet Take 325 mg by mouth every 6 (six) hours as needed.  ? albuterol (PROVENTIL) (2.5 MG/3ML) 0.083% nebulizer solution Inhale 1 vial by Nebulizer 4 x /day or every 4 hr to rescue Asthma  ? albuterol (VENTOLIN HFA) 108 (90 Base) MCG/ACT inhaler Inhale 2 puffs into the lungs every 4 (four) hours as needed for wheezing or shortness of breath. Make sure you rinse mouth out after each use.  ? cetirizine (ZYRTEC ALLERGY) 10 MG tablet Take 1 tablet (10 mg total) by mouth daily.  ? ezetimibe (ZETIA) 10 MG tablet Take 1 tablet (10 mg total) by mouth daily.  ? fluticasone (FLONASE) 50 MCG/ACT nasal spray Place 1 spray into both nostrils daily.  ? Multiple Vitamins-Minerals (MULTIVITAMIN WITH MINERALS) tablet Take 1 tablet by mouth daily.  ? nitroGLYCERIN (NITROSTAT) 0.4 MG SL tablet Place 1 tablet (0.4 mg total) under the tongue every 5 (five) minutes x 3 doses as needed for chest pain.  ? predniSONE (DELTASONE) 2.5 MG tablet Take 3 tablets daily for 2 weeks, then 2 tablets daily until you see [place] again  ? Respiratory Therapy Supplies (NEBULIZER/TUBING/MOUTHPIECE) KIT Disp one nebulizer machine, tubing set and mouthpiece kit for use 4 times daily  ? rosuvastatin (CRESTOR) 20 MG tablet TAKE 1 TABLET BY MOUTH EVERY DAY FOR CHOLESTEROL  ? Spacer/Aero-Holding Chambers DEVI 2 puffs by Does not apply route 2 (two) times daily. Please use with MDI inhalers  ? sulfamethoxazole-trimethoprim (BACTRIM DS) 800-160 MG tablet Take 1 tablet 2 x /day with Meals for 3 weeks for Prostate  Infection  ? VITAMIN D PO Take 5,000 Units by mouth daily.  ? ?No current facility-administered medications on file prior to visit.  ? ? ? ?Allergies: No Known Allergies  ? ?Medical History:  ?Past Medical History:  ?Diagnosis Date  ? Aortic atherosclerosis ([place]) 03/04/2018  ? Noted on CT Abd/pelvis  ? Cataract   ? Bilateral  ? Colon polyp   ? COPD (chronic obstructive  pulmonary disease) ([place])   ? Coronary artery disease   ? Diverticulosis 07/16/2016  ? SIGMOID, NOTED ON COLONOSCOPY  ? Esophageal stenosis 02/24/2018  ? noted on endoscopy  ? Esophageal stricture   ? GERD (gastroesophageal reflux disease)   ? Grade I diastolic dysfunction 76/28/3151  ? noted on ECHO  ? History of hiatal hernia 02/24/2018  ? Small noted on endoscopy  ? Hydronephrosis   ? Hydronephrosis of right kidney 03/04/2018  ? URETERAL STONE 7 MM, NOTED ONJ CT ABD/PELVIS  ? Hyperlipidemia   ? Myocardial infarction [facility])   ? [id number]  ? Nephrolithiasis   ? s/p stent placement  ? Skin cancer   ? 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 Ureteral stone 03/04/2018  ? URETERAL STONE 7 MM. NOTED ON CT ABD/PELVIS  ? Vitamin D deficiency   ? Wears dentures   ? upper  ? ?Family history- Reviewed and unchanged ?Social history- Reviewed and unchanged ? ? ?Review of Systems:  ?Review of Systems  ?Constitutional: Negative.   ?HENT:  Positive for hearing loss.   ?Respiratory: Negative.  Negative for cough.   ?Gastrointestinal: Negative.   ?Genitourinary: Negative.   ?Skin: Negative.   ?Neurological: Negative.   ?  Endo/Heme/Allergies: Negative.   ?Psychiatric/Behavioral: Negative.    ? ? ? ?Physical Exam: ?BP (!) 148/82   Pulse 83   Temp (!) 97.5 ?F (36.4 ?C)   Wt 140 lb (63.5 kg)   SpO2 99%   BMI 21.29 kg/m?  ?Wt Readings from Last 3 Encounters:  ?09/30/21 140 lb (63.5 kg)  ?08/01/21 136 lb 6.4 oz (61.9 kg)  ?07/31/21 135 lb 9.6 oz (61.5 kg)  ? ?General Appearance: Well nourished, in no apparent distress. ?Eyes: PERRLA, EOMs, conjunctiva no swelling or erythema ?Sinuses: No Frontal/maxillary tenderness ?ENT/Mouth: Bilateral hearing aides.  Ext aud canals clear, TMs without erythema, bulging. No erythema, swelling, or exudate on post pharynx.  Tonsils not swollen or erythematous. Hearing normal.  ?Neck: Supple, thyroid normal.  ?Respiratory: Respiratory effort normal, BS equal bilaterally without rales, rhonchi, wheezing  or stridor.  ?Cardio: RRR with no MRGs. Brisk peripheral pulses without edema.  ?Abdomen: Soft, + BS.  Non tender, no guarding, rebound, hernias, masses. ?Lymphatics: Non tender without lymphadenopathy.  ?Musculoskeletal: Full ROM, 5/5 strength, Normal gait ?Skin: Warm, dry without rashe

## 2021-10-01 ENCOUNTER — Other Ambulatory Visit: Payer: Self-pay | Admitting: Internal Medicine

## 2021-10-01 ENCOUNTER — Telehealth: Payer: Self-pay | Admitting: Nurse Practitioner

## 2021-10-01 ENCOUNTER — Other Ambulatory Visit: Payer: Self-pay | Admitting: Nurse Practitioner

## 2021-10-01 LAB — CBC WITH DIFFERENTIAL/PLATELET
Absolute Monocytes: 830 cells/uL (ref 200–950)
Basophils Absolute: 100 cells/uL (ref 0–200)
Basophils Relative: 1.2 %
Eosinophils Absolute: 614 cells/uL — ABNORMAL HIGH (ref 15–500)
Eosinophils Relative: 7.4 %
HCT: 40.6 % (ref 38.5–50.0)
Hemoglobin: 13.7 g/dL (ref 13.2–17.1)
Lymphs Abs: 955 cells/uL (ref 850–3900)
MCH: 32.7 pg (ref 27.0–33.0)
MCHC: 33.7 g/dL (ref 32.0–36.0)
MCV: 96.9 fL (ref 80.0–100.0)
MPV: 9.7 fL (ref 7.5–12.5)
Monocytes Relative: 10 %
Neutro Abs: 5802 cells/uL (ref 1500–7800)
Neutrophils Relative %: 69.9 %
Platelets: 357 10*3/uL (ref 140–400)
RBC: 4.19 10*6/uL — ABNORMAL LOW (ref 4.20–5.80)
RDW: 13.4 % (ref 11.0–15.0)
Total Lymphocyte: 11.5 %
WBC: 8.3 10*3/uL (ref 3.8–10.8)

## 2021-10-01 LAB — COMPLETE METABOLIC PANEL WITH GFR
AG Ratio: 1.6 (calc) (ref 1.0–2.5)
ALT: 8 U/L — ABNORMAL LOW (ref 9–46)
AST: 15 U/L (ref 10–35)
Albumin: 4 g/dL (ref 3.6–5.1)
Alkaline phosphatase (APISO): 63 U/L (ref 35–144)
BUN/Creatinine Ratio: 9 (calc) (ref 6–22)
BUN: 11 mg/dL (ref 7–25)
CO2: 28 mmol/L (ref 20–32)
Calcium: 9.8 mg/dL (ref 8.6–10.3)
Chloride: 104 mmol/L (ref 98–110)
Creat: 1.28 mg/dL — ABNORMAL HIGH (ref 0.70–1.22)
Globulin: 2.5 g/dL (calc) (ref 1.9–3.7)
Glucose, Bld: 101 mg/dL — ABNORMAL HIGH (ref 65–99)
Potassium: 4.5 mmol/L (ref 3.5–5.3)
Sodium: 139 mmol/L (ref 135–146)
Total Bilirubin: 0.4 mg/dL (ref 0.2–1.2)
Total Protein: 6.5 g/dL (ref 6.1–8.1)
eGFR: 56 mL/min/{1.73_m2} — ABNORMAL LOW (ref >=60)

## 2021-10-01 LAB — LIPID PANEL
Cholesterol: 197 mg/dL (ref <200)
HDL: 43 mg/dL (ref >=40)
LDL Cholesterol (Calc): 110 mg/dL (calc) — ABNORMAL HIGH
Non-HDL Cholesterol (Calc): 154 mg/dL (calc) — ABNORMAL HIGH (ref <130)
Total CHOL/HDL Ratio: 4.6 (calc) (ref <5.0)
Triglycerides: 328 mg/dL — ABNORMAL HIGH (ref <150)

## 2021-10-01 LAB — URINALYSIS W MICROSCOPIC + REFLEX CULTURE
Bacteria, UA: NONE SEEN /HPF
Bilirubin Urine: NEGATIVE
Glucose, UA: NEGATIVE
Hyaline Cast: NONE SEEN /LPF
Ketones, ur: NEGATIVE
Leukocyte Esterase: NEGATIVE
Nitrites, Initial: NEGATIVE
Protein, ur: NEGATIVE
Specific Gravity, Urine: 1.007 (ref 1.001–1.035)
Squamous Epithelial / HPF: NONE SEEN /HPF (ref <=5)
WBC, UA: NONE SEEN /HPF (ref 0–5)
pH: 5.5 (ref 5.0–8.0)

## 2021-10-01 LAB — NO CULTURE INDICATED

## 2021-10-01 LAB — VITAMIN D 25 HYDROXY (VIT D DEFICIENCY, FRACTURES): Vit D, 25-Hydroxy: 34 ng/mL (ref 30–100)

## 2021-10-01 MED ORDER — TADALAFIL 20 MG PO TABS: ORAL_TABLET | ORAL | 1 refills | Status: DC

## 2021-10-01 NOTE — Telephone Encounter (Signed)
Patient is requesting a refill of Tadalafil 20 mg to [facility] on [address].  ?

## 2021-10-21 ENCOUNTER — Telehealth (HOSPITAL_COMMUNITY): Payer: Self-pay

## 2021-10-21 ENCOUNTER — Encounter (HOSPITAL_COMMUNITY): Payer: Self-pay

## 2021-10-21 NOTE — Telephone Encounter (Signed)
Pt insurance is active and benefits verified through [facility]. Co-pay $10.00, DED $0.00/$0.00 met, out of pocket $3,400.00/$70.99 met, co-insurance 0%. No pre-authorization required. [place]/[employer], 10/21/21 @ 419PM, FQH#[id number] ?

## 2021-10-21 NOTE — Telephone Encounter (Signed)
Attempted to call patient in regards to Pulmonary Rehab - LM on VM Mailed letter 

## 2021-10-21 NOTE — Telephone Encounter (Signed)
Pt returned PR phone call and stated he is interested. Went over insurance, patient verbalized understanding. Patient will come in for orientation on 10/27/21 @ 9AM and will attend the 1:15PM exercise class. ?  ?Tourist information centre manager.  ?

## 2021-10-27 ENCOUNTER — Encounter (HOSPITAL_COMMUNITY)
Admission: RE | Admit: 2021-10-27 | Discharge: 2021-10-27 | Disposition: A | Discharge status: Home / Self Care | Payer: Medicare HMO | Source: Ambulatory Visit | Attending: Student | Admitting: Student

## 2021-10-27 ENCOUNTER — Encounter (HOSPITAL_COMMUNITY): Payer: Self-pay

## 2021-10-27 VITALS — BP 136/70 | HR 70 | Ht 68.0 in | Wt 131.4 lb

## 2021-10-27 DIAGNOSIS — Z7951 Long term (current) use of inhaled steroids: Secondary | ICD-10-CM | POA: Diagnosis not present

## 2021-10-27 DIAGNOSIS — J449 Chronic obstructive pulmonary disease, unspecified: Secondary | ICD-10-CM | POA: Insufficient documentation

## 2021-10-27 NOTE — Progress Notes (Signed)
Leslie Cardenas 81 y.o. male  Initial Psychosocial Assessment  Pt psychosocial assessment reveals pt lives alone. 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 Pt hobbies include golfing and working in Teacher, English as a foreign language. Pt reports his  stress level is moderate. Areas of stress/anxiety include Health.  Pt does not exhibit signs of depression. Pt shows good  coping skills with positive outlook . Offered emotional support and reassurance. Will continue to monitor.    10/27/2021 12:52 PM

## 2021-10-27 NOTE — Progress Notes (Signed)
Leslie Cardenas 81 y.o. male Pulmonary Rehab Orientation Note This patient who was referred to Pulmonary Rehab by Dr. Verlee Monte with the diagnosis of COPD Stage 3 arrived today in Cardiac and Pulmonary Rehab. He arrived with ambulatory limp gait. One leg is amputated.He does not carry portable oxygen. Per pt, Leslie Cardenas uses oxygen never. Color good, skin warm and dry. Patient is oriented to time and place. Patient's medical history, psychosocial health, and medications reviewed. Psychosocial assessment reveals pt lives with alone. Leslie Cardenas is currently retired. Pt hobbies include  golfing, working in Engineer, drilling . Pt reports his stress level is moderate. Areas of stress/anxiety include health. Pt does not exhibit signs of depression. PHQ2/9 score 0/0. Leslie Cardenas shows good  coping skills with positive outlook on life. Offered emotional support and reassurance. Will continue to monitor. Physical assessment performed by Leslie Small, RN. Please see their orientation physical assessment note. Leslie Cardenas reports he  does take medications as prescribed. Patient states he  follows a regular  diet. Leslie Cardenas wants to gain weight. Pt's weight will be monitored closely. Demonstration and practice of PLB using pulse oximeter. Leslie Cardenas able to return demonstration satisfactorily. Safety and hand hygiene in the exercise area reviewed with patient. Leslie Cardenas voices understanding of the information reviewed. Department expectations discussed with patient and achievable goals were set. The patient shows enthusiasm about attending the program and we look forward to working with Leslie Cardenas. Leslie Cardenas completed a 6 min walk test today and is scheduled to begin exercise on 11/04/21 at 1:15 pm.   [phone] Leslie Plumber, MS, ACSM-CEP

## 2021-10-27 NOTE — Progress Notes (Signed)
Pulmonary Individual Treatment Plan  Patient Details  Name: Leslie Cardenas MRN: [id number] Date of Birth: Oct 10, 1940 Referring Provider:   April Manson Pulmonary Rehab Walk Test from 10/27/2021 in [place]  Referring Provider Meier       Initial Encounter Date:  Flowsheet Row Pulmonary Rehab Walk Test from 10/27/2021 in [place]  Date 10/27/21       Visit Diagnosis: Stage 3 severe COPD by GOLD classification ([place])  Patient's Home Medications on Admission:   Current Outpatient Medications:    acetaminophen (TYLENOL) 325 MG tablet, Take 325 mg by mouth every 6 (six) hours as needed., Disp: , Rfl:    albuterol (PROVENTIL) (2.5 MG/3ML) 0.083% nebulizer solution, Inhale 1 vial by Nebulizer 4 x /day or every 4 hr to rescue Asthma, Disp: 1350 mL, Rfl: 3   albuterol (VENTOLIN HFA) 108 (90 Base) MCG/ACT inhaler, Inhale 2 puffs into the lungs every 4 (four) hours as needed for wheezing or shortness of breath. Make sure you rinse mouth out after each use., Disp: 18 g, Rfl: 2   cetirizine (ZYRTEC ALLERGY) 10 MG tablet, Take 1 tablet (10 mg total) by mouth daily., Disp: 90 tablet, Rfl: 1   fluticasone (FLONASE) 50 MCG/ACT nasal spray, Place 1 spray into both nostrils daily., Disp: 16 g, Rfl: 3   Multiple Vitamins-Minerals (MULTIVITAMIN WITH MINERALS) tablet, Take 1 tablet by mouth daily., Disp: , Rfl:    predniSONE (DELTASONE) 2.5 MG tablet, Take 3 tablets daily for 2 weeks, then 2 tablets daily until you see [place] again, Disp: 98 tablet, Rfl: 0   Respiratory Therapy Supplies (NEBULIZER/TUBING/MOUTHPIECE) KIT, Disp one nebulizer machine, tubing set and mouthpiece kit for use 4 times daily, Disp: 1 kit, Rfl: 0   rosuvastatin (CRESTOR) 20 MG tablet, TAKE 1 TABLET BY MOUTH EVERY DAY FOR CHOLESTEROL, Disp: 90 tablet, Rfl: 1   Spacer/Aero-Holding Chambers DEVI, 2 puffs by Does not apply route 2 (two) times daily. Please use with MDI  inhalers, Disp: 1 each, Rfl: 0   tadalafil (CIALIS) 20 MG tablet, Take 1/2 to 1 tablet every 2 to 3 days if needed for XXXX, Disp: 30 tablet, Rfl: 1   VITAMIN D PO, Take 5,000 Units by mouth daily., Disp: , Rfl:    ezetimibe (ZETIA) 10 MG tablet, Take 1 tablet (10 mg total) by mouth daily., Disp: 90 tablet, Rfl: 1   sulfamethoxazole-trimethoprim (BACTRIM DS) 800-160 MG tablet, Take 1 tablet 2 x /day with Meals for 3 weeks for Prostate  Infection (Patient not taking: Reported on 10/27/2021), Disp: 42 tablet, Rfl: 0  Past Medical History: Past Medical History:  Diagnosis Date   Aortic atherosclerosis ([place]) 03/04/2018   Noted on CT Abd/pelvis   Cataract    Bilateral   Colon polyp    COPD (chronic obstructive pulmonary disease) ([place])    Coronary artery disease    Diverticulosis 07/16/2016   SIGMOID, NOTED ON COLONOSCOPY   Esophageal stenosis 02/24/2018   noted on endoscopy   Esophageal stricture    GERD (gastroesophageal reflux disease)    Grade I diastolic dysfunction 27/25/3664   noted on ECHO   History of hiatal hernia 02/24/2018   Small noted on endoscopy   Hydronephrosis    Hydronephrosis of right kidney 03/04/2018   URETERAL STONE 7 MM, NOTED ONJ CT ABD/PELVIS   Hyperlipidemia    Myocardial infarction [facility])    [id number]   Nephrolithiasis    s/p stent placement   Skin  cancer    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 NOTED ON CT ABD/PELVIS   Vitamin D deficiency    Wears dentures    upper    Tobacco Use: Social History   Tobacco Use  Smoking Status Former   Packs/day: 3.00   Years: 20.00   Pack years: 60.00   Types: Cigarettes   Quit date: 04/11/2000   Years since quitting: 21.5  Smokeless Tobacco Never    Labs: Review Flowsheet        Latest Ref Rng & Units 12/21/2020 01/09/2021 04/14/2021 06/24/2021  Labs for ITP Cardiac and Pulmonary Rehab  Cholestrol <200 mg/dL  199   224   229    LDL (calc) mg/dL (calc)   118   142   134    HDL-C > OR = 40 mg/dL  46   46   52    Trlycerides <150 mg/dL  231   219   277    Hemoglobin A1c <5.7 % of total Hgb   5.7   5.9    TCO2 22 - 32 mmol/L 25          09/30/2021  Labs for ITP Cardiac and Pulmonary Rehab  Cholestrol 197    LDL (calc) 110    HDL-C 43    Trlycerides 328    Hemoglobin A1c   TCO2           Capillary Blood Glucose: Lab Results  Component Value Date   GLUCAP 125 (H) 12/21/2020     Pulmonary Assessment Scores:  Pulmonary Assessment Scores     Row Name 10/27/21 1059         ADL UCSD   ADL Phase Entry     SOB Score total 34       CAT Score   CAT Score 38       mMRC Score   mMRC Score 4             UCSD: Self-administered rating of dyspnea associated with activities of daily living (ADLs) 6-point scale (0 = "not at all" to 5 = "maximal or unable to do because of breathlessness")  Scoring Scores range from 0 to 120.  Minimally important difference is 5 units  CAT: CAT can identify the health impairment of COPD patients and is better correlated with disease progression.  CAT has a scoring range of zero to 40. The CAT score is classified into four groups of low (less than 10), medium (10 - 20), high (21-30) and very high (31-40) based on the impact level of disease on health status. A CAT score over 10 suggests significant symptoms.  A worsening CAT score could be explained by an exacerbation, poor medication adherence, poor inhaler technique, or progression of COPD or comorbid conditions.  CAT MCID is 2 points  mMRC: mMRC (Modified Medical Research Council) Dyspnea Scale is used to assess the degree of baseline functional disability in patients of respiratory disease due to dyspnea. No minimal important difference is established. A decrease in score of 1 point or greater is considered a positive change.   Pulmonary Function Assessment:  Pulmonary Function Assessment - 10/27/21 1055       Breath   Bilateral Breath  Sounds Clear;Decreased    Shortness of Breath Yes;Limiting activity             Exercise Target Goals: Exercise Program Goal: Individual exercise prescription set using results  from initial 6 min walk test and THRR while considering  patient's activity barriers and safety.   Exercise Prescription Goal: Initial exercise prescription builds to 30-45 minutes a day of aerobic activity, 2-3 days per week.  Home exercise guidelines will be given to patient during program as part of exercise prescription that the participant will acknowledge.  Activity Barriers & Risk Stratification:  Activity Barriers & Cardiac Risk Stratification - 10/27/21 1052       Activity Barriers & Cardiac Risk Stratification   Activity Barriers Back Problems;Neck/Spine Problems;Joint Problems;Deconditioning;Muscular Weakness;Shortness of Breath             6 Minute Walk:  6 Minute Walk     Row Name 10/27/21 1240         6 Minute Walk   Phase Initial     Distance 1315 feet     Walk Time 6 minutes     # of Rest Breaks 0     MPH 2.49     METS 2.78     RPE 11     Perceived Dyspnea  1     VO2 Peak 9.73     Symptoms No     Resting HR 70 bpm     Resting BP 136/70     Resting Oxygen Saturation  97 %     Exercise Oxygen Saturation  during 6 min walk 96 %     Max Ex. HR 98 bpm     Max Ex. BP 132/60     2 Minute Post BP 124/60       Interval HR   1 Minute HR 79     2 Minute HR 84     3 Minute HR 84     4 Minute HR 83     5 Minute HR 86     6 Minute HR 98     2 Minute Post HR 69     Interval Heart Rate? Yes       Interval Oxygen   Interval Oxygen? Yes     Baseline Oxygen Saturation % 97 %     1 Minute Oxygen Saturation % 96 %     1 Minute Liters of Oxygen 0 L     2 Minute Oxygen Saturation % 97 %     2 Minute Liters of Oxygen 0 L     3 Minute Oxygen Saturation % 96 %     3 Minute Liters of Oxygen 0 L     4 Minute Oxygen Saturation % 96 %     4 Minute Liters of Oxygen 0 L     5 Minute  Oxygen Saturation % 98 %     5 Minute Liters of Oxygen 0 L     6 Minute Oxygen Saturation % 97 %     6 Minute Liters of Oxygen 0 L     2 Minute Post Oxygen Saturation % 96 %     2 Minute Post Liters of Oxygen 0 L              Oxygen Initial Assessment:  Oxygen Initial Assessment - 10/27/21 1051       Home Oxygen   Home Oxygen Device None    Sleep Oxygen Prescription None    Home Exercise Oxygen Prescription None    Home Resting Oxygen Prescription None      Initial 6 min Walk   Oxygen Used None      Program Oxygen Prescription  Program Oxygen Prescription None      Intervention   Short Term Goals To learn and exhibit compliance with exercise, home and travel O2 prescription;To learn and understand importance of monitoring SPO2 with pulse oximeter and demonstrate accurate use of the pulse oximeter.;To learn and understand importance of maintaining oxygen saturations>88%;To learn and demonstrate proper pursed lip breathing techniques or other breathing techniques. ;To learn and demonstrate proper use of respiratory medications    Long  Term Goals Exhibits compliance with exercise, home  and travel O2 prescription;Verbalizes importance of monitoring SPO2 with pulse oximeter and return demonstration;Maintenance of O2 saturations>88%;Exhibits proper breathing techniques, such as pursed lip breathing or other method taught during program session;Compliance with respiratory medication;Demonstrates proper use of MDI's             Oxygen Re-Evaluation:   Oxygen Discharge (Final Oxygen Re-Evaluation):   Initial Exercise Prescription:  Initial Exercise Prescription - 10/27/21 1200       Date of Initial Exercise RX and Referring Provider   Date 10/27/21    Referring Provider Meier    Expected Discharge Date 01/01/22      NuStep   Level 1    SPM 70    Minutes 15      Track   Minutes 15    METs 2.78      Prescription Details   Frequency (times per week) .2     Duration Progress to 30 minutes of continuous aerobic without signs/symptoms of physical distress      Intensity   THRR 40-80% of Max Heartrate 56-111    Ratings of Perceived Exertion 11-13    Perceived Dyspnea 0-4      Progression   Progression Continue to progress workloads to maintain intensity without signs/symptoms of physical distress.      Resistance Training   Training Prescription Yes    Weight blue bands    Reps 10-15             Perform Capillary Blood Glucose checks as needed.  Exercise Prescription Changes:   Exercise Comments:   Exercise Goals and Review:   Exercise Goals     Row Name 10/27/21 1248             Exercise Goals   Increase Physical Activity Yes       Intervention Provide advice, education, support and counseling about physical activity/exercise needs.;Develop an individualized exercise prescription for aerobic and resistive training based on initial evaluation findings, risk stratification, comorbidities and participant's personal goals.       Expected Outcomes Short Term: Attend rehab on a regular basis to increase amount of physical activity.;Long Term: Add in home exercise to make exercise part of routine and to increase amount of physical activity.;Long Term: Exercising regularly at least 3-5 days a week.       Increase Strength and Stamina Yes       Intervention Provide advice, education, support and counseling about physical activity/exercise needs.;Develop an individualized exercise prescription for aerobic and resistive training based on initial evaluation findings, risk stratification, comorbidities and participant's personal goals.       Expected Outcomes Short Term: Increase workloads from initial exercise prescription for resistance, speed, and METs.;Short Term: Perform resistance training exercises routinely during rehab and add in resistance training at home;Long Term: Improve cardiorespiratory fitness, muscular endurance and  strength as measured by increased METs and functional capacity (6MWT)       Able to understand and use rate of perceived exertion (RPE) scale  Yes       Intervention Provide education and explanation on how to use RPE scale       Expected Outcomes Short Term: Able to use RPE daily in rehab to express subjective intensity level;Long Term:  Able to use RPE to guide intensity level when exercising independently       Able to understand and use Dyspnea scale Yes       Intervention Provide education and explanation on how to use Dyspnea scale       Expected Outcomes Short Term: Able to use Dyspnea scale daily in rehab to express subjective sense of shortness of breath during exertion;Long Term: Able to use Dyspnea scale to guide intensity level when exercising independently       Knowledge and understanding of Target Heart Rate Range (THRR) Yes       Intervention Provide education and explanation of THRR including how the numbers were predicted and where they are located for reference       Expected Outcomes Short Term: Able to state/look up THRR;Long Term: Able to use THRR to govern intensity when exercising independently;Short Term: Able to use daily as guideline for intensity in rehab       Understanding of Exercise Prescription Yes       Intervention Provide education, explanation, and written materials on patient's individual exercise prescription       Expected Outcomes Short Term: Able to explain program exercise prescription;Long Term: Able to explain home exercise prescription to exercise independently                Exercise Goals Re-Evaluation :   Discharge Exercise Prescription (Final Exercise Prescription Changes):   Nutrition:  Target Goals: Understanding of nutrition guidelines, daily intake of sodium '1500mg'$ , cholesterol '200mg'$ , calories 30% from fat and 7% or less from saturated fats, daily to have 5 or more servings of fruits and vegetables.  Biometrics:    Nutrition  Therapy Plan and Nutrition Goals:   Nutrition Assessments:  MEDIFICTS Score Key: ?70 Need to make dietary changes  40-70 Heart Healthy Diet ? 40 Therapeutic Level Cholesterol Diet   Picture Your Plate Scores: <34 Unhealthy dietary pattern with much room for improvement. 41-50 Dietary pattern unlikely to meet recommendations for good health and room for improvement. 51-60 More healthful dietary pattern, with some room for improvement.  >60 Healthy dietary pattern, although there may be some specific behaviors that could be improved.    Nutrition Goals Re-Evaluation:   Nutrition Goals Discharge (Final Nutrition Goals Re-Evaluation):   Psychosocial: Target Goals: Acknowledge presence or absence of significant depression and/or stress, maximize coping skills, provide positive support system. Participant is able to verbalize types and ability to use techniques and skills needed for reducing stress and depression.  Initial Review & Psychosocial Screening:  Initial Psych Review & Screening - 10/27/21 1049       Initial Review   Current issues with None Identified      Family Dynamics   Good Support System? Yes    Comments long-term friend      Barriers   Psychosocial barriers to participate in program There are no identifiable barriers or psychosocial needs.      Screening Interventions   Interventions Encouraged to exercise             Quality of Life Scores:  Scores of 19 and below usually indicate a poorer quality of life in these areas.  A difference of  2-3 points is a clinically meaningful  difference.  A difference of 2-3 points in the total score of the Quality of Life Index has been associated with significant improvement in overall quality of life, self-image, physical symptoms, and general health in studies assessing change in quality of life.  PHQ-9: Review Flowsheet        10/27/2021 06/23/2021 04/14/2021 06/05/2020 11/27/2019  Depression screen PHQ 2/9   Decreased Interest 0 0 0 0 0  Down, Depressed, Hopeless 0 0 1 0 0  PHQ - 2 Score 0 0 1 0 0  Altered sleeping 0      Tired, decreased energy 0      Change in appetite 0      Feeling bad or failure about yourself  0      Trouble concentrating 0      Moving slowly or fidgety/restless 0      Suicidal thoughts 0      PHQ-9 Score 0             Interpretation of Total Score  Total Score Depression Severity:  1-4 = Minimal depression, 5-9 = Mild depression, 10-14 = Moderate depression, 15-19 = Moderately severe depression, 20-27 = Severe depression   Psychosocial Evaluation and Intervention:  Psychosocial Evaluation - 10/27/21 1049       Psychosocial Evaluation & Interventions   Interventions Encouraged to exercise with the program and follow exercise prescription    Expected Outcomes For [place] to participate in Pulmonary Rehab    Continue Psychosocial Services  Follow up required by counselor             Psychosocial Re-Evaluation:   Psychosocial Discharge (Final Psychosocial Re-Evaluation):   Education: Education Goals: Education classes will be provided on a weekly basis, covering required topics. Participant will state understanding/return demonstration of topics presented.  Learning Barriers/Preferences:  Learning Barriers/Preferences - 10/27/21 1247       Learning Barriers/Preferences   Learning Barriers Sight    Learning Preferences None             Education Topics: Risk Factor Reduction:  -Group instruction that is supported by a PowerPoint presentation. Instructor discusses the definition of a risk factor, different risk factors for pulmonary disease, and how the heart and lungs work together.     Nutrition for Pulmonary Patient:  -Group instruction provided by PowerPoint slides, verbal discussion, and written materials to support subject matter. The instructor gives an explanation and review of healthy diet recommendations, which includes a  discussion on weight management, recommendations for fruit and vegetable consumption, as well as protein, fluid, caffeine, fiber, sodium, sugar, and alcohol. Tips for eating when patients are short of breath are discussed.   Pursed Lip Breathing:  -Group instruction that is supported by demonstration and informational handouts. Instructor discusses the benefits of pursed lip and diaphragmatic breathing and detailed demonstration on how to preform both.     Oxygen Safety:  -Group instruction provided by PowerPoint, verbal discussion, and written material to support subject matter. There is an overview of "What is Oxygen" and "Why do we need it".  Instructor also reviews how to create a safe environment for oxygen use, the importance of using oxygen as prescribed, and the risks of noncompliance. There is a brief discussion on traveling with oxygen and resources the patient may utilize.   Oxygen Equipment:  -Group instruction provided by [facility] Staff utilizing handouts, written materials, and equipment demonstrations.   Signs and Symptoms:  -Group instruction provided by written material and verbal discussion to  support subject matter. Warning signs and symptoms of infection, stroke, and heart attack are reviewed and when to call the physician/911 reinforced. Tips for preventing the spread of infection discussed.   Advanced Directives:  -Group instruction provided by verbal instruction and written material to support subject matter. Instructor reviews Advanced Directive laws and proper instruction for filling out document.   Pulmonary Video:  -Group video education that reviews the importance of medication and oxygen compliance, exercise, good nutrition, pulmonary hygiene, and pursed lip and diaphragmatic breathing for the pulmonary patient.   Exercise for the Pulmonary Patient:  -Group instruction that is supported by a PowerPoint presentation. Instructor discusses benefits of  exercise, core components of exercise, frequency, duration, and intensity of an exercise routine, importance of utilizing pulse oximetry during exercise, safety while exercising, and options of places to exercise outside of rehab.     Pulmonary Medications:  -Verbally interactive group education provided by instructor with focus on inhaled medications and proper administration.   Anatomy and Physiology of the Respiratory System and Intimacy:  -Group instruction provided by PowerPoint, verbal discussion, and written material to support subject matter. Instructor reviews respiratory cycle and anatomical components of the respiratory system and their functions. Instructor also reviews differences in obstructive and restrictive respiratory diseases with examples of each. Intimacy, Sex, and Sexuality differences are reviewed with a discussion on how relationships can change when diagnosed with pulmonary disease. Common sexual concerns are reviewed.   MD DAY -A group question and answer session with a medical doctor that allows participants to ask questions that relate to their pulmonary disease state.   OTHER EDUCATION -Group or individual verbal, written, or video instructions that support the educational goals of the pulmonary rehab program.   Holiday Eating Survival Tips:  -Group instruction provided by PowerPoint slides, verbal discussion, and written materials to support subject matter. The instructor gives patients tips, tricks, and techniques to help them not only survive but enjoy the holidays despite the onslaught of food that accompanies the holidays.   Knowledge Questionnaire Score:  Knowledge Questionnaire Score - 10/27/21 1050       Knowledge Questionnaire Score   Pre Score 17/18             Core Components/Risk Factors/Patient Goals at Admission:  Personal Goals and Risk Factors at Admission - 10/27/21 1050       Core Components/Risk Factors/Patient Goals on Admission     Weight Management Weight Gain    Improve shortness of breath with ADL's Yes    Intervention Provide education, individualized exercise plan and daily activity instruction to help decrease symptoms of SOB with activities of daily living.    Expected Outcomes Short Term: Improve cardiorespiratory fitness to achieve a reduction of symptoms when performing ADLs;Long Term: Be able to perform more ADLs without symptoms or delay the onset of symptoms             Core Components/Risk Factors/Patient Goals Review:    Core Components/Risk Factors/Patient Goals at Discharge (Final Review):    ITP Comments: Dr. Rodman Pickle is Medical Director for Pulmonary Rehab at [facility].

## 2021-10-28 NOTE — Progress Notes (Signed)
Pulmonary Rehab Orientation Physical Assessment Note  Physical assessment reveals  Pt is alert and oriented x 3.  Heart rate is normal, breath sounds clear to auscultation, no wheezes, rales, or rhonchi, diminished throughout. Reports productive cough. Uses nebulizer twice a day rarely uses inhaler. Bowel sounds present.  Pt denies  abdominal discomfort, nausea, vomiting or diarrhea. Grip strength equal, strong. Distal pulses palpable;  no swelling to left ankle, pt has prosthetic right leg. Cherre Huger, BSN Cardiac and Training and development officer

## 2021-10-29 NOTE — Progress Notes (Signed)
Pulmonary Individual Treatment Plan  Patient Details  Name: Leslie Cardenas MRN: [id number] Date of Birth: 11/12/1940 Referring Provider:   April Manson Pulmonary Rehab Walk Test from 10/27/2021 in [place]  Referring Provider Meier       Initial Encounter Date:  Flowsheet Row Pulmonary Rehab Walk Test from 10/27/2021 in [facility]  Date 10/27/21       Visit Diagnosis: Stage 3 severe COPD by GOLD classification ([place])  Patient's Home Medications on Admission:   Current Outpatient Medications:    acetaminophen (TYLENOL) 325 MG tablet, Take 325 mg by mouth every 6 (six) hours as needed., Disp: , Rfl:    albuterol (PROVENTIL) (2.5 MG/3ML) 0.083% nebulizer solution, Inhale 1 vial by Nebulizer 4 x /day or every 4 hr to rescue Asthma, Disp: 1350 mL, Rfl: 3   albuterol (VENTOLIN HFA) 108 (90 Base) MCG/ACT inhaler, Inhale 2 puffs into the lungs every 4 (four) hours as needed for wheezing or shortness of breath. Make sure you rinse mouth out after each use., Disp: 18 g, Rfl: 2   cetirizine (ZYRTEC ALLERGY) 10 MG tablet, Take 1 tablet (10 mg total) by mouth daily., Disp: 90 tablet, Rfl: 1   fluticasone (FLONASE) 50 MCG/ACT nasal spray, Place 1 spray into both nostrils daily., Disp: 16 g, Rfl: 3   Multiple Vitamins-Minerals (MULTIVITAMIN WITH MINERALS) tablet, Take 1 tablet by mouth daily., Disp: , Rfl:    predniSONE (DELTASONE) 2.5 MG tablet, Take 3 tablets daily for 2 weeks, then 2 tablets daily until you see [place] again, Disp: 98 tablet, Rfl: 0   Respiratory Therapy Supplies (NEBULIZER/TUBING/MOUTHPIECE) KIT, Disp one nebulizer machine, tubing set and mouthpiece kit for use 4 times daily, Disp: 1 kit, Rfl: 0   rosuvastatin (CRESTOR) 20 MG tablet, TAKE 1 TABLET BY MOUTH EVERY DAY FOR CHOLESTEROL, Disp: 90 tablet, Rfl: 1   Spacer/Aero-Holding Chambers DEVI, 2 puffs by Does not apply route 2 (two) times daily. Please use with MDI  inhalers, Disp: 1 each, Rfl: 0   tadalafil (CIALIS) 20 MG tablet, Take 1/2 to 1 tablet every 2 to 3 days if needed for XXXX, Disp: 30 tablet, Rfl: 1   VITAMIN D PO, Take 5,000 Units by mouth daily., Disp: , Rfl:    ezetimibe (ZETIA) 10 MG tablet, Take 1 tablet (10 mg total) by mouth daily., Disp: 90 tablet, Rfl: 1   sulfamethoxazole-trimethoprim (BACTRIM DS) 800-160 MG tablet, Take 1 tablet 2 x /day with Meals for 3 weeks for Prostate  Infection (Patient not taking: Reported on 10/27/2021), Disp: 42 tablet, Rfl: 0  Past Medical History: Past Medical History:  Diagnosis Date   Aortic atherosclerosis ([place]) 03/04/2018   Noted on CT Abd/pelvis   Cataract    Bilateral   Colon polyp    COPD (chronic obstructive pulmonary disease) ([place])    Coronary artery disease    Diverticulosis 07/16/2016   SIGMOID, NOTED ON COLONOSCOPY   Esophageal stenosis 02/24/2018   noted on endoscopy   Esophageal stricture    GERD (gastroesophageal reflux disease)    Grade I diastolic dysfunction 36/05/2448   noted on ECHO   History of hiatal hernia 02/24/2018   Small noted on endoscopy   Hydronephrosis    Hydronephrosis of right kidney 03/04/2018   URETERAL STONE 7 MM, NOTED ONJ CT ABD/PELVIS   Hyperlipidemia    Myocardial infarction [facility])    [id number]   Nephrolithiasis    s/p stent placement   Skin  cancer    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 NOTED ON CT ABD/PELVIS   Vitamin D deficiency    Wears dentures    upper    Tobacco Use: Social History   Tobacco Use  Smoking Status Former   Packs/day: 3.00   Years: 20.00   Pack years: 60.00   Types: Cigarettes   Quit date: 04/11/2000   Years since quitting: 21.5  Smokeless Tobacco Never    Labs: Review Flowsheet        Latest Ref Rng & Units 12/21/2020 01/09/2021 04/14/2021 06/24/2021  Labs for ITP Cardiac and Pulmonary Rehab  Cholestrol <200 mg/dL  199   224   229    LDL (calc) mg/dL (calc)   118   142   134    HDL-C > OR = 40 mg/dL  46   46   52    Trlycerides <150 mg/dL  231   219   277    Hemoglobin A1c <5.7 % of total Hgb   5.7   5.9    TCO2 22 - 32 mmol/L 25          09/30/2021  Labs for ITP Cardiac and Pulmonary Rehab  Cholestrol 197    LDL (calc) 110    HDL-C 43    Trlycerides 328    Hemoglobin A1c   TCO2           Capillary Blood Glucose: Lab Results  Component Value Date   GLUCAP 125 (H) 12/21/2020     Pulmonary Assessment Scores:  Pulmonary Assessment Scores     Row Name 10/27/21 1059         ADL UCSD   ADL Phase Entry     SOB Score total 34       CAT Score   CAT Score 38       mMRC Score   mMRC Score 4             UCSD: Self-administered rating of dyspnea associated with activities of daily living (ADLs) 6-point scale (0 = "not at all" to 5 = "maximal or unable to do because of breathlessness")  Scoring Scores range from 0 to 120.  Minimally important difference is 5 units  CAT: CAT can identify the health impairment of COPD patients and is better correlated with disease progression.  CAT has a scoring range of zero to 40. The CAT score is classified into four groups of low (less than 10), medium (10 - 20), high (21-30) and very high (31-40) based on the impact level of disease on health status. A CAT score over 10 suggests significant symptoms.  A worsening CAT score could be explained by an exacerbation, poor medication adherence, poor inhaler technique, or progression of COPD or comorbid conditions.  CAT MCID is 2 points  mMRC: mMRC (Modified Medical Research Council) Dyspnea Scale is used to assess the degree of baseline functional disability in patients of respiratory disease due to dyspnea. No minimal important difference is established. A decrease in score of 1 point or greater is considered a positive change.   Pulmonary Function Assessment:  Pulmonary Function Assessment - 10/27/21 1055       Breath   Bilateral Breath  Sounds Clear;Decreased    Shortness of Breath Yes;Limiting activity             Exercise Target Goals: Exercise Program Goal: Individual exercise prescription set using results  from initial 6 min walk test and THRR while considering  patient's activity barriers and safety.   Exercise Prescription Goal: Initial exercise prescription builds to 30-45 minutes a day of aerobic activity, 2-3 days per week.  Home exercise guidelines will be given to patient during program as part of exercise prescription that the participant will acknowledge.  Activity Barriers & Risk Stratification:  Activity Barriers & Cardiac Risk Stratification - 10/27/21 1052       Activity Barriers & Cardiac Risk Stratification   Activity Barriers Back Problems;Neck/Spine Problems;Joint Problems;Deconditioning;Muscular Weakness;Shortness of Breath             6 Minute Walk:  6 Minute Walk     Row Name 10/27/21 1240         6 Minute Walk   Phase Initial     Distance 1315 feet     Walk Time 6 minutes     # of Rest Breaks 0     MPH 2.49     METS 2.78     RPE 11     Perceived Dyspnea  1     VO2 Peak 9.73     Symptoms No     Resting HR 70 bpm     Resting BP 136/70     Resting Oxygen Saturation  97 %     Exercise Oxygen Saturation  during 6 min walk 96 %     Max Ex. HR 98 bpm     Max Ex. BP 132/60     2 Minute Post BP 124/60       Interval HR   1 Minute HR 79     2 Minute HR 84     3 Minute HR 84     4 Minute HR 83     5 Minute HR 86     6 Minute HR 98     2 Minute Post HR 69     Interval Heart Rate? Yes       Interval Oxygen   Interval Oxygen? Yes     Baseline Oxygen Saturation % 97 %     1 Minute Oxygen Saturation % 96 %     1 Minute Liters of Oxygen 0 L     2 Minute Oxygen Saturation % 97 %     2 Minute Liters of Oxygen 0 L     3 Minute Oxygen Saturation % 96 %     3 Minute Liters of Oxygen 0 L     4 Minute Oxygen Saturation % 96 %     4 Minute Liters of Oxygen 0 L     5 Minute  Oxygen Saturation % 98 %     5 Minute Liters of Oxygen 0 L     6 Minute Oxygen Saturation % 97 %     6 Minute Liters of Oxygen 0 L     2 Minute Post Oxygen Saturation % 96 %     2 Minute Post Liters of Oxygen 0 L              Oxygen Initial Assessment:  Oxygen Initial Assessment - 10/27/21 1051       Home Oxygen   Home Oxygen Device None    Sleep Oxygen Prescription None    Home Exercise Oxygen Prescription None    Home Resting Oxygen Prescription None      Initial 6 min Walk   Oxygen Used None      Program Oxygen Prescription  Program Oxygen Prescription None      Intervention   Short Term Goals To learn and exhibit compliance with exercise, home and travel O2 prescription;To learn and understand importance of monitoring SPO2 with pulse oximeter and demonstrate accurate use of the pulse oximeter.;To learn and understand importance of maintaining oxygen saturations>88%;To learn and demonstrate proper pursed lip breathing techniques or other breathing techniques. ;To learn and demonstrate proper use of respiratory medications    Long  Term Goals Exhibits compliance with exercise, home  and travel O2 prescription;Verbalizes importance of monitoring SPO2 with pulse oximeter and return demonstration;Maintenance of O2 saturations>88%;Exhibits proper breathing techniques, such as pursed lip breathing or other method taught during program session;Compliance with respiratory medication;Demonstrates proper use of MDI's             Oxygen Re-Evaluation:  Oxygen Re-Evaluation     Row Name 10/28/21 0941             Program Oxygen Prescription   Program Oxygen Prescription None         Home Oxygen   Home Oxygen Device None       Sleep Oxygen Prescription None       Home Exercise Oxygen Prescription None       Home Resting Oxygen Prescription None         Goals/Expected Outcomes   Short Term Goals To learn and exhibit compliance with exercise, home and travel O2  prescription;To learn and understand importance of monitoring SPO2 with pulse oximeter and demonstrate accurate use of the pulse oximeter.;To learn and understand importance of maintaining oxygen saturations>88%;To learn and demonstrate proper pursed lip breathing techniques or other breathing techniques. ;To learn and demonstrate proper use of respiratory medications       Long  Term Goals Exhibits compliance with exercise, home  and travel O2 prescription;Verbalizes importance of monitoring SPO2 with pulse oximeter and return demonstration;Maintenance of O2 saturations>88%;Exhibits proper breathing techniques, such as pursed lip breathing or other method taught during program session;Compliance with respiratory medication;Demonstrates proper use of MDI's       Goals/Expected Outcomes Compliance and understanding of oxygen saturation monitoring and breathing techniques to decrease shortness of breath.                Oxygen Discharge (Final Oxygen Re-Evaluation):  Oxygen Re-Evaluation - 10/28/21 0941       Program Oxygen Prescription   Program Oxygen Prescription None      Home Oxygen   Home Oxygen Device None    Sleep Oxygen Prescription None    Home Exercise Oxygen Prescription None    Home Resting Oxygen Prescription None      Goals/Expected Outcomes   Short Term Goals To learn and exhibit compliance with exercise, home and travel O2 prescription;To learn and understand importance of monitoring SPO2 with pulse oximeter and demonstrate accurate use of the pulse oximeter.;To learn and understand importance of maintaining oxygen saturations>88%;To learn and demonstrate proper pursed lip breathing techniques or other breathing techniques. ;To learn and demonstrate proper use of respiratory medications    Long  Term Goals Exhibits compliance with exercise, home  and travel O2 prescription;Verbalizes importance of monitoring SPO2 with pulse oximeter and return demonstration;Maintenance of O2  saturations>88%;Exhibits proper breathing techniques, such as pursed lip breathing or other method taught during program session;Compliance with respiratory medication;Demonstrates proper use of MDI's    Goals/Expected Outcomes Compliance and understanding of oxygen saturation monitoring and breathing techniques to decrease shortness of breath.  Initial Exercise Prescription:  Initial Exercise Prescription - 10/27/21 1200       Date of Initial Exercise RX and Referring Provider   Date 10/27/21    Referring Provider Meier    Expected Discharge Date 01/01/22      NuStep   Level 1    SPM 70    Minutes 15      Track   Minutes 15    METs 2.78      Prescription Details   Frequency (times per week) .2    Duration Progress to 30 minutes of continuous aerobic without signs/symptoms of physical distress      Intensity   THRR 40-80% of Max Heartrate 56-111    Ratings of Perceived Exertion 11-13    Perceived Dyspnea 0-4      Progression   Progression Continue to progress workloads to maintain intensity without signs/symptoms of physical distress.      Resistance Training   Training Prescription Yes    Weight blue bands    Reps 10-15             Perform Capillary Blood Glucose checks as needed.  Exercise Prescription Changes:   Exercise Comments:   Exercise Goals and Review:   Exercise Goals     Row Name 10/27/21 1248 10/28/21 0940           Exercise Goals   Increase Physical Activity Yes Yes      Intervention Provide advice, education, support and counseling about physical activity/exercise needs.;Develop an individualized exercise prescription for aerobic and resistive training based on initial evaluation findings, risk stratification, comorbidities and participant's personal goals. Provide advice, education, support and counseling about physical activity/exercise needs.;Develop an individualized exercise prescription for aerobic and resistive  training based on initial evaluation findings, risk stratification, comorbidities and participant's personal goals.      Expected Outcomes Short Term: Attend rehab on a regular basis to increase amount of physical activity.;Long Term: Add in home exercise to make exercise part of routine and to increase amount of physical activity.;Long Term: Exercising regularly at least 3-5 days a week. Short Term: Attend rehab on a regular basis to increase amount of physical activity.;Long Term: Add in home exercise to make exercise part of routine and to increase amount of physical activity.;Long Term: Exercising regularly at least 3-5 days a week.      Increase Strength and Stamina Yes Yes      Intervention Provide advice, education, support and counseling about physical activity/exercise needs.;Develop an individualized exercise prescription for aerobic and resistive training based on initial evaluation findings, risk stratification, comorbidities and participant's personal goals. Provide advice, education, support and counseling about physical activity/exercise needs.;Develop an individualized exercise prescription for aerobic and resistive training based on initial evaluation findings, risk stratification, comorbidities and participant's personal goals.      Expected Outcomes Short Term: Increase workloads from initial exercise prescription for resistance, speed, and METs.;Short Term: Perform resistance training exercises routinely during rehab and add in resistance training at home;Long Term: Improve cardiorespiratory fitness, muscular endurance and strength as measured by increased METs and functional capacity (6MWT) Short Term: Increase workloads from initial exercise prescription for resistance, speed, and METs.;Short Term: Perform resistance training exercises routinely during rehab and add in resistance training at home;Long Term: Improve cardiorespiratory fitness, muscular endurance and strength as measured by  increased METs and functional capacity (6MWT)      Able to understand and use rate of perceived exertion (RPE) scale Yes Yes  Intervention Provide education and explanation on how to use RPE scale Provide education and explanation on how to use RPE scale      Expected Outcomes Short Term: Able to use RPE daily in rehab to express subjective intensity level;Long Term:  Able to use RPE to guide intensity level when exercising independently Short Term: Able to use RPE daily in rehab to express subjective intensity level;Long Term:  Able to use RPE to guide intensity level when exercising independently      Able to understand and use Dyspnea scale Yes Yes      Intervention Provide education and explanation on how to use Dyspnea scale Provide education and explanation on how to use Dyspnea scale      Expected Outcomes Short Term: Able to use Dyspnea scale daily in rehab to express subjective sense of shortness of breath during exertion;Long Term: Able to use Dyspnea scale to guide intensity level when exercising independently Short Term: Able to use Dyspnea scale daily in rehab to express subjective sense of shortness of breath during exertion;Long Term: Able to use Dyspnea scale to guide intensity level when exercising independently      Knowledge and understanding of Target Heart Rate Range (THRR) Yes Yes      Intervention Provide education and explanation of THRR including how the numbers were predicted and where they are located for reference Provide education and explanation of THRR including how the numbers were predicted and where they are located for reference      Expected Outcomes Short Term: Able to state/look up THRR;Long Term: Able to use THRR to govern intensity when exercising independently;Short Term: Able to use daily as guideline for intensity in rehab Short Term: Able to state/look up THRR;Long Term: Able to use THRR to govern intensity when exercising independently;Short Term: Able to use  daily as guideline for intensity in rehab      Understanding of Exercise Prescription Yes Yes      Intervention Provide education, explanation, and written materials on patient's individual exercise prescription Provide education, explanation, and written materials on patient's individual exercise prescription      Expected Outcomes Short Term: Able to explain program exercise prescription;Long Term: Able to explain home exercise prescription to exercise independently Short Term: Able to explain program exercise prescription;Long Term: Able to explain home exercise prescription to exercise independently               Exercise Goals Re-Evaluation :  Exercise Goals Re-Evaluation     Row Name 10/28/21 0940             Exercise Goal Re-Evaluation   Exercise Goals Review Increase Physical Activity;Increase Strength and Stamina;Able to understand and use rate of perceived exertion (RPE) scale;Able to understand and use Dyspnea scale;Knowledge and understanding of Target Heart Rate Range (THRR);Understanding of Exercise Prescription       Comments Pt is scheduled to begin exercise next week. Will continue to monitor and progress as able.       Expected Outcomes Through exercise at rehab and home, the patient will decrease shortness of breath with daily activities and feel confident in carrying out an exercise regimen at home.                Discharge Exercise Prescription (Final Exercise Prescription Changes):   Nutrition:  Target Goals: Understanding of nutrition guidelines, daily intake of sodium <1537m, cholesterol <2033m calories 30% from fat and 7% or less from saturated fats, daily to have 5 or more  servings of fruits and vegetables.  Biometrics:    Nutrition Therapy Plan and Nutrition Goals:   Nutrition Assessments:  MEDIFICTS Score Key: ?70 Need to make dietary changes  40-70 Heart Healthy Diet ? 40 Therapeutic Level Cholesterol Diet   Picture Your Plate  Scores: <02 Unhealthy dietary pattern with much room for improvement. 41-50 Dietary pattern unlikely to meet recommendations for good health and room for improvement. 51-60 More healthful dietary pattern, with some room for improvement.  >60 Healthy dietary pattern, although there may be some specific behaviors that could be improved.    Nutrition Goals Re-Evaluation:   Nutrition Goals Discharge (Final Nutrition Goals Re-Evaluation):   Psychosocial: Target Goals: Acknowledge presence or absence of significant depression and/or stress, maximize coping skills, provide positive support system. Participant is able to verbalize types and ability to use techniques and skills needed for reducing stress and depression.  Initial Review & Psychosocial Screening:  Initial Psych Review & Screening - 10/27/21 1049       Initial Review   Current issues with None Identified      Family Dynamics   Good Support System? Yes    Comments long-term friend      Barriers   Psychosocial barriers to participate in program There are no identifiable barriers or psychosocial needs.      Screening Interventions   Interventions Encouraged to exercise             Quality of Life Scores:  Scores of 19 and below usually indicate a poorer quality of life in these areas.  A difference of  2-3 points is a clinically meaningful difference.  A difference of 2-3 points in the total score of the Quality of Life Index has been associated with significant improvement in overall quality of life, self-image, physical symptoms, and general health in studies assessing change in quality of life.  PHQ-9: Review Flowsheet        10/27/2021 06/23/2021 04/14/2021 06/05/2020 11/27/2019  Depression screen PHQ 2/9  Decreased Interest 0 0 0 0 0  Down, Depressed, Hopeless 0 0 1 0 0  PHQ - 2 Score 0 0 1 0 0  Altered sleeping 0      Tired, decreased energy 0      Change in appetite 0      Feeling bad or failure about  yourself  0      Trouble concentrating 0      Moving slowly or fidgety/restless 0      Suicidal thoughts 0      PHQ-9 Score 0             Interpretation of Total Score  Total Score Depression Severity:  1-4 = Minimal depression, 5-9 = Mild depression, 10-14 = Moderate depression, 15-19 = Moderately severe depression, 20-27 = Severe depression   Psychosocial Evaluation and Intervention:  Psychosocial Evaluation - 10/27/21 1049       Psychosocial Evaluation & Interventions   Interventions Encouraged to exercise with the program and follow exercise prescription    Expected Outcomes For Jermiah to participate in Pulmonary Rehab    Continue Psychosocial Services  Follow up required by counselor             Psychosocial Re-Evaluation:  Psychosocial Re-Evaluation     [place] Name 10/28/21 [phone]             Psychosocial Re-Evaluation   Current issues with None Identified       Comments No changes in psychosocial assessment since  orientation 10/27/2021 which was yesterday. Will evaluate every 30 days.       Expected Outcomes For Zaydyn to continue to have no barriers or psychosocial concerns while participating in pulmonary rehab.       Interventions Encouraged to attend Pulmonary Rehabilitation for the exercise       Continue Psychosocial Services  No Follow up required                Psychosocial Discharge (Final Psychosocial Re-Evaluation):  Psychosocial Re-Evaluation - 10/28/21 0933       Psychosocial Re-Evaluation   Current issues with None Identified    Comments No changes in psychosocial assessment since orientation 10/27/2021 which was yesterday. Will evaluate every 30 days.    Expected Outcomes For Arne to continue to have no barriers or psychosocial concerns while participating in pulmonary rehab.    Interventions Encouraged to attend Pulmonary Rehabilitation for the exercise    Continue Psychosocial Services  No Follow up required              Education: Education Goals: Education classes will be provided on a weekly basis, covering required topics. Participant will state understanding/return demonstration of topics presented.  Learning Barriers/Preferences:  Learning Barriers/Preferences - 10/27/21 1247       Learning Barriers/Preferences   Learning Barriers Sight    Learning Preferences None             Education Topics: Risk Factor Reduction:  -Group instruction that is supported by a PowerPoint presentation. Instructor discusses the definition of a risk factor, different risk factors for pulmonary disease, and how the heart and lungs work together.     Nutrition for Pulmonary Patient:  -Group instruction provided by PowerPoint slides, verbal discussion, and written materials to support subject matter. The instructor gives an explanation and review of healthy diet recommendations, which includes a discussion on weight management, recommendations for fruit and vegetable consumption, as well as protein, fluid, caffeine, fiber, sodium, sugar, and alcohol. Tips for eating when patients are short of breath are discussed.   Pursed Lip Breathing:  -Group instruction that is supported by demonstration and informational handouts. Instructor discusses the benefits of pursed lip and diaphragmatic breathing and detailed demonstration on how to preform both.     Oxygen Safety:  -Group instruction provided by PowerPoint, verbal discussion, and written material to support subject matter. There is an overview of "What is Oxygen" and "Why do we need it".  Instructor also reviews how to create a safe environment for oxygen use, the importance of using oxygen as prescribed, and the risks of noncompliance. There is a brief discussion on traveling with oxygen and resources the patient may utilize.   Oxygen Equipment:  -Group instruction provided by [facility] Staff utilizing handouts, written materials, and equipment  demonstrations.   Signs and Symptoms:  -Group instruction provided by written material and verbal discussion to support subject matter. Warning signs and symptoms of infection, stroke, and heart attack are reviewed and when to call the physician/911 reinforced. Tips for preventing the spread of infection discussed.   Advanced Directives:  -Group instruction provided by verbal instruction and written material to support subject matter. Instructor reviews Advanced Directive laws and proper instruction for filling out document.   Pulmonary Video:  -Group video education that reviews the importance of medication and oxygen compliance, exercise, good nutrition, pulmonary hygiene, and pursed lip and diaphragmatic breathing for the pulmonary patient.   Exercise for the Pulmonary Patient:  -Group instruction that is  supported by a PowerPoint presentation. Instructor discusses benefits of exercise, core components of exercise, frequency, duration, and intensity of an exercise routine, importance of utilizing pulse oximetry during exercise, safety while exercising, and options of places to exercise outside of rehab.     Pulmonary Medications:  -Verbally interactive group education provided by instructor with focus on inhaled medications and proper administration.   Anatomy and Physiology of the Respiratory System and Intimacy:  -Group instruction provided by PowerPoint, verbal discussion, and written material to support subject matter. Instructor reviews respiratory cycle and anatomical components of the respiratory system and their functions. Instructor also reviews differences in obstructive and restrictive respiratory diseases with examples of each. Intimacy, Sex, and Sexuality differences are reviewed with a discussion on how relationships can change when diagnosed with pulmonary disease. Common sexual concerns are reviewed.   MD DAY -A group question and answer session with a medical doctor  that allows participants to ask questions that relate to their pulmonary disease state.   OTHER EDUCATION -Group or individual verbal, written, or video instructions that support the educational goals of the pulmonary rehab program.   Holiday Eating Survival Tips:  -Group instruction provided by PowerPoint slides, verbal discussion, and written materials to support subject matter. The instructor gives patients tips, tricks, and techniques to help them not only survive but enjoy the holidays despite the onslaught of food that accompanies the holidays.   Knowledge Questionnaire Score:  Knowledge Questionnaire Score - 10/27/21 1050       Knowledge Questionnaire Score   Pre Score 17/18             Core Components/Risk Factors/Patient Goals at Admission:  Personal Goals and Risk Factors at Admission - 10/27/21 1050       Core Components/Risk Factors/Patient Goals on Admission    Weight Management Weight Gain    Improve shortness of breath with ADL's Yes    Intervention Provide education, individualized exercise plan and daily activity instruction to help decrease symptoms of SOB with activities of daily living.    Expected Outcomes Short Term: Improve cardiorespiratory fitness to achieve a reduction of symptoms when performing ADLs;Long Term: Be able to perform more ADLs without symptoms or delay the onset of symptoms             Core Components/Risk Factors/Patient Goals Review:   Goals and Risk Factor Review     Row Name 10/28/21 0937             Core Components/Risk Factors/Patient Goals Review   Personal Goals Review Weight Management/Obesity;Develop more efficient breathing techniques such as purse lipped breathing and diaphragmatic breathing and practicing self-pacing with activity.;Increase knowledge of respiratory medications and ability to use respiratory devices properly.;Improve shortness of breath with ADL's       Review Has not started the program yet, will  reevaluate in the next 30 days.       Expected Outcomes See admission goals.                Core Components/Risk Factors/Patient Goals at Discharge (Final Review):   Goals and Risk Factor Review - 10/28/21 0937       Core Components/Risk Factors/Patient Goals Review   Personal Goals Review Weight Management/Obesity;Develop more efficient breathing techniques such as purse lipped breathing and diaphragmatic breathing and practicing self-pacing with activity.;Increase knowledge of respiratory medications and ability to use respiratory devices properly.;Improve shortness of breath with ADL's    Review Has not started the program yet, will reevaluate  in the next 30 days.    Expected Outcomes See admission goals.             ITP Comments: Pt is scheduled to begin exercise next week. Recommend continued exercise, life style modification, education, and utilization of breathing techniques to increase stamina and strength, while also decreasing shortness of breath with exertion.  Dr. Rodman Pickle is Medical Director for Pulmonary Rehab at [facility].

## 2021-11-04 ENCOUNTER — Encounter (HOSPITAL_COMMUNITY)
Admission: RE | Admit: 2021-11-04 | Discharge: 2021-11-04 | Disposition: A | Discharge status: Home / Self Care | Payer: Medicare HMO | Source: Ambulatory Visit | Attending: Student | Admitting: Student

## 2021-11-04 VITALS — Wt 132.1 lb

## 2021-11-04 DIAGNOSIS — J449 Chronic obstructive pulmonary disease, unspecified: Secondary | ICD-10-CM

## 2021-11-04 DIAGNOSIS — Z7951 Long term (current) use of inhaled steroids: Secondary | ICD-10-CM | POA: Diagnosis not present

## 2021-11-04 NOTE — Progress Notes (Signed)
Daily Session Note  Patient Details  Name: Leslie Cardenas MRN: [id number] Date of Birth: 03/23/1941 Referring Provider:   Flowsheet Row Pulmonary Rehab Walk Test from 10/27/2021 in [facility]  Referring Provider Meier       Encounter Date: 11/04/2021  Check In:  Session Check In - 11/04/21 1418       Check-In   Supervising physician immediately available to respond to emergencies [facility] immediately available    Physician(s) Dr. Adhikari    Location [facility]    Staff Present Joan Behrens, RN, BSN;Lisa Hughes, RN;Olinty Richards, MS, ACSM CEP, Exercise Physiologist;Kaylee Davis, MS, ACSM-CEP, Exercise Physiologist    Virtual Visit No    Medication changes reported     No    Fall or balance concerns reported    No    Tobacco Cessation No Change    Warm-up and Cool-down Performed as group-led instruction    Resistance Training Performed Yes    VAD Patient? No    PAD/SET Patient? No      Pain Assessment   Currently in Pain? No/denies    Multiple Pain Sites No             Capillary Blood Glucose: No results found for this or any previous visit (from the past 24 hour(s)).   Exercise Prescription Changes - 11/04/21 1400       Response to Exercise   Blood Pressure (Admit) 140/68    Blood Pressure (Exercise) 144/70    Blood Pressure (Exit) 138/80    Heart Rate (Admit) 77 bpm    Heart Rate (Exercise) 89 bpm    Heart Rate (Exit) 82 bpm    Oxygen Saturation (Admit) 96 %    Oxygen Saturation (Exercise) 94 %    Oxygen Saturation (Exit) 99 %    Rating of Perceived Exertion (Exercise) 11    Perceived Dyspnea (Exercise) 1    Duration Progress to 30 minutes of  aerobic without signs/symptoms of physical distress    Intensity THRR unchanged      Resistance Training   Training Prescription Yes    Weight blue bands    Reps 10-15    Time 10 Minutes      NuStep   Level 2    SPM 70    Minutes 15    METs 2.6       Track   Laps 16    Minutes 15    METs 2.68             Social History   Tobacco Use  Smoking Status Former   Packs/day: 3.00   Years: 20.00   Pack years: 60.00   Types: Cigarettes   Quit date: 04/11/2000   Years since quitting: 21.5  Smokeless Tobacco Never    Goals Met:  Proper associated with RPD/PD & O2 Sat Exercise tolerated well No report of concerns or symptoms today Strength training completed today  Goals Unmet:  Not Applicable  Comments: Service time is from 1320 to 1432.    Dr. Jane Ellison is Medical Director for Pulmonary Rehab at [facility].  

## 2021-11-06 ENCOUNTER — Encounter (HOSPITAL_COMMUNITY)
Admission: RE | Admit: 2021-11-06 | Discharge: 2021-11-06 | Disposition: A | Discharge status: Home / Self Care | Payer: Medicare HMO | Source: Ambulatory Visit | Attending: Student | Admitting: Student

## 2021-11-06 VITALS — Wt 132.1 lb

## 2021-11-06 DIAGNOSIS — J449 Chronic obstructive pulmonary disease, unspecified: Secondary | ICD-10-CM | POA: Insufficient documentation

## 2021-11-06 NOTE — Progress Notes (Signed)
Leslie Cardenas 81 y.o. male Nutrition Note [place] is motivated to make lifestyle changes to aid with pulmonary rehab; he is motivated to gain weight/lean muscle mass. Patient has medical history of HTN, ASHD hx MI, TIA, thoracic aortic atherosclerosis, COPD, GERD, CKD3, hyperlipidemia, preDM, hx of right BKA. He reports skipping meals (breakfast) and eating fast food regularly. 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   Labs: triglycerides 328, LDL 56 Creat 1.28, GFR 56, A1c 5.9  RMR: 1279 (x1.3, 1663kcals) Protein: 81-101g (1.2-1.5g/kg ideal body weight)  Nutrition Diagnosis Increased energy expenditure related to increased energy requirements during COPD exacerbation as evidenced by BMI <20 and recent h/o wt loss.  Nutrition Intervention Pt's individual nutrition plan reviewed with pt. Benefits of adopting Heart Healthy diet discussed.  Continue client-centered nutrition education by RD, as part of interdisciplinary care.  Monitor/Evaluation: Patient reports motivation to make lifestyle changes for adherence to heart healthy diet recommendation and weight gain. We discussed strategies for weight gain including nutrition supplements, eating frequency, etc. We further discussed fiber intake, protein intake/sources, etc. Handouts/notes given. Patient amicable to RD suggestions and verbalizes understanding. Will follow-up as needed.   15 minutes spent in review of topics related to a heart healthy diet including sodium intake, blood sugar control, weight management, and fiber intake.  Goal(s) Eat three meals per day and add 2-3 snacks daily to aid with increasing overall calorie intake to support weight gain + lean muscle mass Include some sort of protein at all meals and snacks. Aim for 80-100g daily.  Include vegetables at lunch and dinner daily.  Pt to identify and limit food sources of saturated fat, trans fat, refined carbohydrates and  sodium Pt able to name foods that affect blood glucose. Continue to limit simple sugars, refined carbohydrates, sugary beverages, etc.  Pt to describe the benefit of including lean protein/plant proteins, fruits, vegetables, whole grains, nuts/seeds, and low-fat dairy products in a heart healthy meal plan.  Plan:  Will provide client-centered nutrition education as part of interdisciplinary care Monitor and evaluate progress toward nutrition goal with team.   Fate Caster [place], MS, RDN, LDN

## 2021-11-06 NOTE — Progress Notes (Signed)
Daily Session Note  Patient Details  Name: SHARIF RENDELL MRN: [id number] Date of Birth: 1941-02-22 Referring Provider:   April Manson Pulmonary Rehab Walk Test from 10/27/2021 in [place]  Referring Provider Meier       Encounter Date: 11/06/2021  Check In:  Session Check In - 11/06/21 1426       Check-In   Supervising physician immediately available to respond to emergencies [facility] immediately available    Physician(s) Dr. Karleen Hampshire    [facility]    Staff Present Rosebud Poles, RN, BSN;Lisa Ysidro Evert, Cathleen Fears, MS, ACSM-CEP, Exercise Physiologist    Virtual Visit No    Medication changes reported     No    Fall or balance concerns reported    No    Tobacco Cessation No Change    Warm-up and Cool-down Performed as group-led instruction    Resistance Training Performed Yes    VAD Patient? No    PAD/SET Patient? No      Pain Assessment   Currently in Pain? No/denies    Multiple Pain Sites No             Capillary Blood Glucose: No results found for this or any previous visit (from the past 24 hour(s)).    Social History   Tobacco Use  Smoking Status Former   Packs/day: 3.00   Years: 20.00   Pack years: 60.00   Types: Cigarettes   Quit date: 04/11/2000   Years since quitting: 21.5  Smokeless Tobacco Never    Goals Met:  Proper associated with RPD/PD & O2 Sat Exercise tolerated well No report of concerns or symptoms today Strength training completed today  Goals Unmet:  Not Applicable  Comments: Service time is from 1335 to [place]    Dr. Rodman Pickle is Medical Director for Pulmonary Rehab at [facility].

## 2021-11-11 ENCOUNTER — Encounter (HOSPITAL_COMMUNITY)
Admission: RE | Admit: 2021-11-11 | Discharge: 2021-11-11 | Disposition: A | Discharge status: Home / Self Care | Payer: Medicare HMO | Source: Ambulatory Visit | Attending: Student | Admitting: Student

## 2021-11-11 DIAGNOSIS — J449 Chronic obstructive pulmonary disease, unspecified: Secondary | ICD-10-CM

## 2021-11-11 NOTE — Progress Notes (Signed)
Daily Session Note  Patient Details  Name: Leslie Cardenas MRN: [id number] Date of Birth: 1940-09-07 Referring Provider:   April Manson Pulmonary Rehab Walk Test from 10/27/2021 in [place]  Referring Provider Meier       Encounter Date: 11/11/2021  Check In:  Session Check In - 11/11/21 1354       Check-In   Supervising physician immediately available to respond to emergencies [facility] immediately available    Physician(s) Dr. Karleen Hampshire    [facility]    Staff Present Rosebud Poles, RN, BSN;Lisa Ysidro Evert, Cathleen Fears, MS, ACSM-CEP, Exercise Physiologist    Virtual Visit No    Medication changes reported     No    Fall or balance concerns reported    No    Tobacco Cessation No Change    Warm-up and Cool-down Performed as group-led instruction    Resistance Training Performed Yes    VAD Patient? No    PAD/SET Patient? No      Pain Assessment   Currently in Pain? No/denies    Multiple Pain Sites No             Capillary Blood Glucose: No results found for this or any previous visit (from the past 24 hour(s)).    Social History   Tobacco Use  Smoking Status Former   Packs/day: 3.00   Years: 20.00   Pack years: 60.00   Types: Cigarettes   Quit date: 04/11/2000   Years since quitting: 21.6  Smokeless Tobacco Never    Goals Met:  Proper associated with RPD/PD & O2 Sat Exercise tolerated well No report of concerns or symptoms today Strength training completed today  Goals Unmet:  Not Applicable  Comments: Service time is from 1320 to 1443.    Dr. Rodman Pickle is Medical Director for Pulmonary Rehab at [facility].

## 2021-11-13 ENCOUNTER — Encounter (HOSPITAL_COMMUNITY)
Admission: RE | Admit: 2021-11-13 | Discharge: 2021-11-13 | Disposition: A | Discharge status: Home / Self Care | Payer: Medicare HMO | Source: Ambulatory Visit | Attending: Student | Admitting: Student

## 2021-11-13 VITALS — Wt 133.8 lb

## 2021-11-13 DIAGNOSIS — L814 Other melanin hyperpigmentation: Secondary | ICD-10-CM | POA: Diagnosis not present

## 2021-11-13 DIAGNOSIS — J449 Chronic obstructive pulmonary disease, unspecified: Secondary | ICD-10-CM | POA: Diagnosis not present

## 2021-11-13 DIAGNOSIS — D1801 Hemangioma of skin and subcutaneous tissue: Secondary | ICD-10-CM | POA: Diagnosis not present

## 2021-11-13 DIAGNOSIS — Z85828 Personal history of other malignant neoplasm of skin: Secondary | ICD-10-CM | POA: Diagnosis not present

## 2021-11-13 DIAGNOSIS — C44212 Basal cell carcinoma of skin of right ear and external auricular canal: Secondary | ICD-10-CM | POA: Diagnosis not present

## 2021-11-13 DIAGNOSIS — C44329 Squamous cell carcinoma of skin of other parts of face: Secondary | ICD-10-CM | POA: Diagnosis not present

## 2021-11-13 DIAGNOSIS — C4441 Basal cell carcinoma of skin of scalp and neck: Secondary | ICD-10-CM | POA: Diagnosis not present

## 2021-11-13 DIAGNOSIS — L570 Actinic keratosis: Secondary | ICD-10-CM | POA: Diagnosis not present

## 2021-11-13 NOTE — Progress Notes (Signed)
Daily Session Note  Patient Details  Name: SHERWIN HOLLINGSHED MRN: [id number] Date of Birth: Apr 06, 1941 Referring Provider:   April Manson Pulmonary Rehab Walk Test from 10/27/2021 in [place]  Referring Provider Meier       Encounter Date: 11/13/2021  Check In:  Session Check In - 11/13/21 1417       Check-In   Supervising physician immediately available to respond to emergencies [facility] immediately available    Physician(s) Dr. Cyndia Skeeters    [facility]    Staff Present Rosebud Poles, RN, BSN;Lisa Ysidro Evert, Cathleen Fears, MS, ACSM-CEP, Exercise Physiologist    Virtual Visit No    Medication changes reported     No    Fall or balance concerns reported    No    Tobacco Cessation No Change    Warm-up and Cool-down Performed as group-led instruction    Resistance Training Performed Yes    VAD Patient? No    PAD/SET Patient? No      Pain Assessment   Currently in Pain? No/denies    Multiple Pain Sites No             Capillary Blood Glucose: No results found for this or any previous visit (from the past 24 hour(s)).    Social History   Tobacco Use  Smoking Status Former   Packs/day: 3.00   Years: 20.00   Total pack years: 60.00   Types: Cigarettes   Quit date: 04/11/2000   Years since quitting: 21.6  Smokeless Tobacco Never    Goals Met:  Proper associated with RPD/PD & O2 Sat Exercise tolerated well No report of concerns or symptoms today Strength training completed today  Goals Unmet:  Not Applicable  Comments: Service time is from 1318 to 1444.    Dr. Rodman Pickle is Medical Director for Pulmonary Rehab at [facility].

## 2021-11-18 ENCOUNTER — Telehealth (HOSPITAL_COMMUNITY): Payer: Self-pay | Admitting: Internal Medicine

## 2021-11-18 ENCOUNTER — Encounter (HOSPITAL_COMMUNITY)
Admission: RE | Admit: 2021-11-18 | Discharge: 2021-11-18 | Disposition: A | Discharge status: Home / Self Care | Payer: Medicare HMO | Source: Ambulatory Visit | Attending: Student | Admitting: Student

## 2021-11-20 ENCOUNTER — Encounter (HOSPITAL_COMMUNITY): Payer: Medicare HMO

## 2021-11-25 ENCOUNTER — Telehealth (HOSPITAL_COMMUNITY): Payer: Self-pay | Admitting: *Deleted

## 2021-11-25 ENCOUNTER — Encounter (HOSPITAL_COMMUNITY): Payer: Medicare HMO

## 2021-11-25 NOTE — Telephone Encounter (Signed)
Called Leslie Cardenas to check on him since he called out sick last week. He states that he didn't feel like he was ready to come back today but , he plans to return to [place] on Thursday.

## 2021-11-26 NOTE — Progress Notes (Signed)
Pulmonary Individual Treatment Plan  Patient Details  Name: Leslie Cardenas MRN: [id number] Date of Birth: July 24, 1940 Referring Provider:   April Manson Pulmonary Rehab Walk Test from 10/27/2021 in [place]  Referring Provider Meier       Initial Encounter Date:  Flowsheet Row Pulmonary Rehab Walk Test from 10/27/2021 in [place]  Date 10/27/21       Visit Diagnosis: Stage 3 severe COPD by GOLD classification ([place])  Patient's Home Medications on Admission:   Current Outpatient Medications:    acetaminophen (TYLENOL) 325 MG tablet, Take 325 mg by mouth every 6 (six) hours as needed., Disp: , Rfl:    albuterol (PROVENTIL) (2.5 MG/3ML) 0.083% nebulizer solution, Inhale 1 vial by Nebulizer 4 x /day or every 4 hr to rescue Asthma, Disp: 1350 mL, Rfl: 3   albuterol (VENTOLIN HFA) 108 (90 Base) MCG/ACT inhaler, Inhale 2 puffs into the lungs every 4 (four) hours as needed for wheezing or shortness of breath. Make sure you rinse mouth out after each use., Disp: 18 g, Rfl: 2   cetirizine (ZYRTEC ALLERGY) 10 MG tablet, Take 1 tablet (10 mg total) by mouth daily., Disp: 90 tablet, Rfl: 1   ezetimibe (ZETIA) 10 MG tablet, Take 1 tablet (10 mg total) by mouth daily., Disp: 90 tablet, Rfl: 1   fluticasone (FLONASE) 50 MCG/ACT nasal spray, Place 1 spray into both nostrils daily., Disp: 16 g, Rfl: 3   Multiple Vitamins-Minerals (MULTIVITAMIN WITH MINERALS) tablet, Take 1 tablet by mouth daily., Disp: , Rfl:    predniSONE (DELTASONE) 2.5 MG tablet, Take 3 tablets daily for 2 weeks, then 2 tablets daily until you see [place] again, Disp: 98 tablet, Rfl: 0   Respiratory Therapy Supplies (NEBULIZER/TUBING/MOUTHPIECE) KIT, Disp one nebulizer machine, tubing set and mouthpiece kit for use 4 times daily, Disp: 1 kit, Rfl: 0   rosuvastatin (CRESTOR) 20 MG tablet, TAKE 1 TABLET BY MOUTH EVERY DAY FOR CHOLESTEROL, Disp: 90 tablet, Rfl: 1    Spacer/Aero-Holding Chambers DEVI, 2 puffs by Does not apply route 2 (two) times daily. Please use with MDI inhalers, Disp: 1 each, Rfl: 0   sulfamethoxazole-trimethoprim (BACTRIM DS) 800-160 MG tablet, Take 1 tablet 2 x /day with Meals for 3 weeks for Prostate  Infection (Patient not taking: Reported on 10/27/2021), Disp: 42 tablet, Rfl: 0   tadalafil (CIALIS) 20 MG tablet, Take 1/2 to 1 tablet every 2 to 3 days if needed for XXXX, Disp: 30 tablet, Rfl: 1   VITAMIN D PO, Take 5,000 Units by mouth daily., Disp: , Rfl:   Past Medical History: Past Medical History:  Diagnosis Date   Aortic atherosclerosis ([place]) 03/04/2018   Noted on CT Abd/pelvis   Cataract    Bilateral   Colon polyp    COPD (chronic obstructive pulmonary disease) ([place])    Coronary artery disease    Diverticulosis 07/16/2016   SIGMOID, NOTED ON COLONOSCOPY   Esophageal stenosis 02/24/2018   noted on endoscopy   Esophageal stricture    GERD (gastroesophageal reflux disease)    Grade I diastolic dysfunction 24/23/5361   noted on ECHO   History of hiatal hernia 02/24/2018   Small noted on endoscopy   Hydronephrosis    Hydronephrosis of right kidney 03/04/2018   URETERAL STONE 7 MM, NOTED ONJ CT ABD/PELVIS   Hyperlipidemia    Myocardial infarction [facility])    [id number]   Nephrolithiasis    s/p stent placement   Skin  cancer    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 NOTED ON CT ABD/PELVIS   Vitamin D deficiency    Wears dentures    upper    Tobacco Use: Social History   Tobacco Use  Smoking Status Former   Packs/day: 3.00   Years: 20.00   Total pack years: 60.00   Types: Cigarettes   Quit date: 04/11/2000   Years since quitting: 21.6  Smokeless Tobacco Never    Labs: Review Flowsheet  More data exists      Latest Ref Rng & Units 12/21/2020 01/09/2021 04/14/2021 06/24/2021  Labs for ITP Cardiac and Pulmonary Rehab  Cholestrol <200 mg/dL - 199  224  229    LDL (calc) mg/dL (calc) - 118  142  134   HDL-C > OR = 40 mg/dL - 46  46  52   Trlycerides <150 mg/dL - 231  219  277   Hemoglobin A1c <5.7 % of total Hgb - - 5.7  5.9   TCO2 22 - 32 mmol/L 25  - - -      09/30/2021  Labs for ITP Cardiac and Pulmonary Rehab  Cholestrol 197   LDL (calc) 110   HDL-C 43   Trlycerides 328   Hemoglobin A1c -  TCO2 -    Capillary Blood Glucose: Lab Results  Component Value Date   GLUCAP 125 (H) 12/21/2020     Pulmonary Assessment Scores:  Pulmonary Assessment Scores     Row Name 10/27/21 1059         ADL UCSD   ADL Phase Entry     SOB Score total 34       CAT Score   CAT Score 38       mMRC Score   mMRC Score 4             UCSD: Self-administered rating of dyspnea associated with activities of daily living (ADLs) 6-point scale (0 = "not at all" to 5 = "maximal or unable to do because of breathlessness")  Scoring Scores range from 0 to 120.  Minimally important difference is 5 units  CAT: CAT can identify the health impairment of COPD patients and is better correlated with disease progression.  CAT has a scoring range of zero to 40. The CAT score is classified into four groups of low (less than 10), medium (10 - 20), high (21-30) and very high (31-40) based on the impact level of disease on health status. A CAT score over 10 suggests significant symptoms.  A worsening CAT score could be explained by an exacerbation, poor medication adherence, poor inhaler technique, or progression of COPD or comorbid conditions.  CAT MCID is 2 points  mMRC: mMRC (Modified Medical Research Council) Dyspnea Scale is used to assess the degree of baseline functional disability in patients of respiratory disease due to dyspnea. No minimal important difference is established. A decrease in score of 1 point or greater is considered a positive change.   Pulmonary Function Assessment:  Pulmonary Function Assessment - 10/27/21 1055       Breath    Bilateral Breath Sounds Clear;Decreased    Shortness of Breath Yes;Limiting activity             Exercise Target Goals: Exercise Program Goal: Individual exercise prescription set using results from initial 6 min walk test and THRR while considering  patient's activity barriers and safety.   Exercise Prescription Goal:  Initial exercise prescription builds to 30-45 minutes a day of aerobic activity, 2-3 days per week.  Home exercise guidelines will be given to patient during program as part of exercise prescription that the participant will acknowledge.  Activity Barriers & Risk Stratification:  Activity Barriers & Cardiac Risk Stratification - 10/27/21 1052       Activity Barriers & Cardiac Risk Stratification   Activity Barriers Back Problems;Neck/Spine Problems;Joint Problems;Deconditioning;Muscular Weakness;Shortness of Breath             6 Minute Walk:  6 Minute Walk     Row Name 10/27/21 1240         6 Minute Walk   Phase Initial     Distance 1315 feet     Walk Time 6 minutes     # of Rest Breaks 0     MPH 2.49     METS 2.78     RPE 11     Perceived Dyspnea  1     VO2 Peak 9.73     Symptoms No     Resting HR 70 bpm     Resting BP 136/70     Resting Oxygen Saturation  97 %     Exercise Oxygen Saturation  during 6 min walk 96 %     Max Ex. HR 98 bpm     Max Ex. BP 132/60     2 Minute Post BP 124/60       Interval HR   1 Minute HR 79     2 Minute HR 84     3 Minute HR 84     4 Minute HR 83     5 Minute HR 86     6 Minute HR 98     2 Minute Post HR 69     Interval Heart Rate? Yes       Interval Oxygen   Interval Oxygen? Yes     Baseline Oxygen Saturation % 97 %     1 Minute Oxygen Saturation % 96 %     1 Minute Liters of Oxygen 0 L     2 Minute Oxygen Saturation % 97 %     2 Minute Liters of Oxygen 0 L     3 Minute Oxygen Saturation % 96 %     3 Minute Liters of Oxygen 0 L     4 Minute Oxygen Saturation % 96 %     4 Minute Liters of Oxygen 0  L     5 Minute Oxygen Saturation % 98 %     5 Minute Liters of Oxygen 0 L     6 Minute Oxygen Saturation % 97 %     6 Minute Liters of Oxygen 0 L     2 Minute Post Oxygen Saturation % 96 %     2 Minute Post Liters of Oxygen 0 L              Oxygen Initial Assessment:  Oxygen Initial Assessment - 10/27/21 1051       Home Oxygen   Home Oxygen Device None    Sleep Oxygen Prescription None    Home Exercise Oxygen Prescription None    Home Resting Oxygen Prescription None      Initial 6 min Walk   Oxygen Used None      Program Oxygen Prescription   Program Oxygen Prescription None      Intervention   Short Term Goals To learn and exhibit  compliance with exercise, home and travel O2 prescription;To learn and understand importance of monitoring SPO2 with pulse oximeter and demonstrate accurate use of the pulse oximeter.;To learn and understand importance of maintaining oxygen saturations>88%;To learn and demonstrate proper pursed lip breathing techniques or other breathing techniques. ;To learn and demonstrate proper use of respiratory medications    Long  Term Goals Exhibits compliance with exercise, home  and travel O2 prescription;Verbalizes importance of monitoring SPO2 with pulse oximeter and return demonstration;Maintenance of O2 saturations>88%;Exhibits proper breathing techniques, such as pursed lip breathing or other method taught during program session;Compliance with respiratory medication;Demonstrates proper use of MDI's             Oxygen Re-Evaluation:  Oxygen Re-Evaluation     Row Name 10/28/21 0941 11/18/21 0736           Program Oxygen Prescription   Program Oxygen Prescription None None        Home Oxygen   Home Oxygen Device None None      Sleep Oxygen Prescription None None      Home Exercise Oxygen Prescription None None      Home Resting Oxygen Prescription None None        Goals/Expected Outcomes   Short Term Goals To learn and exhibit  compliance with exercise, home and travel O2 prescription;To learn and understand importance of monitoring SPO2 with pulse oximeter and demonstrate accurate use of the pulse oximeter.;To learn and understand importance of maintaining oxygen saturations>88%;To learn and demonstrate proper pursed lip breathing techniques or other breathing techniques. ;To learn and demonstrate proper use of respiratory medications To learn and exhibit compliance with exercise, home and travel O2 prescription;To learn and understand importance of monitoring SPO2 with pulse oximeter and demonstrate accurate use of the pulse oximeter.;To learn and understand importance of maintaining oxygen saturations>88%;To learn and demonstrate proper pursed lip breathing techniques or other breathing techniques. ;To learn and demonstrate proper use of respiratory medications      Long  Term Goals Exhibits compliance with exercise, home  and travel O2 prescription;Verbalizes importance of monitoring SPO2 with pulse oximeter and return demonstration;Maintenance of O2 saturations>88%;Exhibits proper breathing techniques, such as pursed lip breathing or other method taught during program session;Compliance with respiratory medication;Demonstrates proper use of MDI's Exhibits compliance with exercise, home  and travel O2 prescription;Verbalizes importance of monitoring SPO2 with pulse oximeter and return demonstration;Maintenance of O2 saturations>88%;Exhibits proper breathing techniques, such as pursed lip breathing or other method taught during program session;Compliance with respiratory medication;Demonstrates proper use of MDI's      Goals/Expected Outcomes Compliance and understanding of oxygen saturation monitoring and breathing techniques to decrease shortness of breath. Compliance and understanding of oxygen saturation monitoring and breathing techniques to decrease shortness of breath.               Oxygen Discharge (Final Oxygen  Re-Evaluation):  Oxygen Re-Evaluation - 11/18/21 0736       Program Oxygen Prescription   Program Oxygen Prescription None      Home Oxygen   Home Oxygen Device None    Sleep Oxygen Prescription None    Home Exercise Oxygen Prescription None    Home Resting Oxygen Prescription None      Goals/Expected Outcomes   Short Term Goals To learn and exhibit compliance with exercise, home and travel O2 prescription;To learn and understand importance of monitoring SPO2 with pulse oximeter and demonstrate accurate use of the pulse oximeter.;To learn and understand importance of maintaining oxygen saturations>88%;To learn and  demonstrate proper pursed lip breathing techniques or other breathing techniques. ;To learn and demonstrate proper use of respiratory medications    Long  Term Goals Exhibits compliance with exercise, home  and travel O2 prescription;Verbalizes importance of monitoring SPO2 with pulse oximeter and return demonstration;Maintenance of O2 saturations>88%;Exhibits proper breathing techniques, such as pursed lip breathing or other method taught during program session;Compliance with respiratory medication;Demonstrates proper use of MDI's    Goals/Expected Outcomes Compliance and understanding of oxygen saturation monitoring and breathing techniques to decrease shortness of breath.             Initial Exercise Prescription:  Initial Exercise Prescription - 10/27/21 1200       Date of Initial Exercise RX and Referring Provider   Date 10/27/21    Referring Provider Meier    Expected Discharge Date 01/01/22      NuStep   Level 1    SPM 70    Minutes 15      Track   Minutes 15    METs 2.78      Prescription Details   Frequency (times per week) .2    Duration Progress to 30 minutes of continuous aerobic without signs/symptoms of physical distress      Intensity   THRR 40-80% of Max Heartrate 56-111    Ratings of Perceived Exertion 11-13    Perceived Dyspnea 0-4       Progression   Progression Continue to progress workloads to maintain intensity without signs/symptoms of physical distress.      Resistance Training   Training Prescription Yes    Weight blue bands    Reps 10-15             Perform Capillary Blood Glucose checks as needed.  Exercise Prescription Changes:   Exercise Prescription Changes     Row Name 11/04/21 1400 11/13/21 1506           Response to Exercise   Blood Pressure (Admit) 140/68 112/68      Blood Pressure (Exercise) 144/70 --      Blood Pressure (Exit) 138/80 134/70      Heart Rate (Admit) 77 bpm 78 bpm      Heart Rate (Exercise) 89 bpm 98 bpm      Heart Rate (Exit) 82 bpm 89 bpm      Oxygen Saturation (Admit) 96 % 95 %      Oxygen Saturation (Exercise) 94 % 95 %      Oxygen Saturation (Exit) 99 % 93 %      Rating of Perceived Exertion (Exercise) 11 11      Perceived Dyspnea (Exercise) 1 1      Duration Progress to 30 minutes of  aerobic without signs/symptoms of physical distress Continue with 30 min of aerobic exercise without signs/symptoms of physical distress.      Intensity THRR unchanged THRR unchanged        Resistance Training   Training Prescription Yes Yes      Weight blue bands blue bands      Reps 10-15 10-15      Time 10 Minutes 10 Minutes        Treadmill   MPH -- 2      Grade -- 0      Minutes -- 15      METs -- 2.53        NuStep   Level 2 5      SPM 70 70  Minutes 15 15      METs 2.6 2.9        Track   Laps 16 --      Minutes 15 --      METs 2.68 --               Exercise Comments:   Exercise Comments     Row Name 11/04/21 1449           Exercise Comments Pt completed his first day of exercise. Gloria exercised for 15 min on the Nustep and track. He averaged 2.6 METs at level 2 on the Nustep and 2.86 METs on the track. Discussed moving Amaris to the treadmill. He agreed with me.                Exercise Goals and Review:   Exercise Goals     Row Name  10/27/21 1248 10/28/21 0940 11/18/21 0736         Exercise Goals   Increase Physical Activity Yes Yes Yes     Intervention Provide advice, education, support and counseling about physical activity/exercise needs.;Develop an individualized exercise prescription for aerobic and resistive training based on initial evaluation findings, risk stratification, comorbidities and participant's personal goals. Provide advice, education, support and counseling about physical activity/exercise needs.;Develop an individualized exercise prescription for aerobic and resistive training based on initial evaluation findings, risk stratification, comorbidities and participant's personal goals. Provide advice, education, support and counseling about physical activity/exercise needs.;Develop an individualized exercise prescription for aerobic and resistive training based on initial evaluation findings, risk stratification, comorbidities and participant's personal goals.     Expected Outcomes Short Term: Attend rehab on a regular basis to increase amount of physical activity.;Long Term: Add in home exercise to make exercise part of routine and to increase amount of physical activity.;Long Term: Exercising regularly at least 3-5 days a week. Short Term: Attend rehab on a regular basis to increase amount of physical activity.;Long Term: Add in home exercise to make exercise part of routine and to increase amount of physical activity.;Long Term: Exercising regularly at least 3-5 days a week. Short Term: Attend rehab on a regular basis to increase amount of physical activity.;Long Term: Add in home exercise to make exercise part of routine and to increase amount of physical activity.;Long Term: Exercising regularly at least 3-5 days a week.     Increase Strength and Stamina Yes Yes Yes     Intervention Provide advice, education, support and counseling about physical activity/exercise needs.;Develop an individualized exercise  prescription for aerobic and resistive training based on initial evaluation findings, risk stratification, comorbidities and participant's personal goals. Provide advice, education, support and counseling about physical activity/exercise needs.;Develop an individualized exercise prescription for aerobic and resistive training based on initial evaluation findings, risk stratification, comorbidities and participant's personal goals. Provide advice, education, support and counseling about physical activity/exercise needs.;Develop an individualized exercise prescription for aerobic and resistive training based on initial evaluation findings, risk stratification, comorbidities and participant's personal goals.     Expected Outcomes Short Term: Increase workloads from initial exercise prescription for resistance, speed, and METs.;Short Term: Perform resistance training exercises routinely during rehab and add in resistance training at home;Long Term: Improve cardiorespiratory fitness, muscular endurance and strength as measured by increased METs and functional capacity (6MWT) Short Term: Increase workloads from initial exercise prescription for resistance, speed, and METs.;Short Term: Perform resistance training exercises routinely during rehab and add in resistance training at home;Long Term: Improve cardiorespiratory fitness, muscular endurance and  strength as measured by increased METs and functional capacity (6MWT) Short Term: Increase workloads from initial exercise prescription for resistance, speed, and METs.;Short Term: Perform resistance training exercises routinely during rehab and add in resistance training at home;Long Term: Improve cardiorespiratory fitness, muscular endurance and strength as measured by increased METs and functional capacity (6MWT)     Able to understand and use rate of perceived exertion (RPE) scale Yes Yes Yes     Intervention Provide education and explanation on how to use RPE scale  Provide education and explanation on how to use RPE scale Provide education and explanation on how to use RPE scale     Expected Outcomes Short Term: Able to use RPE daily in rehab to express subjective intensity level;Long Term:  Able to use RPE to guide intensity level when exercising independently Short Term: Able to use RPE daily in rehab to express subjective intensity level;Long Term:  Able to use RPE to guide intensity level when exercising independently Short Term: Able to use RPE daily in rehab to express subjective intensity level;Long Term:  Able to use RPE to guide intensity level when exercising independently     Able to understand and use Dyspnea scale Yes Yes Yes     Intervention Provide education and explanation on how to use Dyspnea scale Provide education and explanation on how to use Dyspnea scale Provide education and explanation on how to use Dyspnea scale     Expected Outcomes Short Term: Able to use Dyspnea scale daily in rehab to express subjective sense of shortness of breath during exertion;Long Term: Able to use Dyspnea scale to guide intensity level when exercising independently Short Term: Able to use Dyspnea scale daily in rehab to express subjective sense of shortness of breath during exertion;Long Term: Able to use Dyspnea scale to guide intensity level when exercising independently Short Term: Able to use Dyspnea scale daily in rehab to express subjective sense of shortness of breath during exertion;Long Term: Able to use Dyspnea scale to guide intensity level when exercising independently     Knowledge and understanding of Target Heart Rate Range (THRR) Yes Yes Yes     Intervention Provide education and explanation of THRR including how the numbers were predicted and where they are located for reference Provide education and explanation of THRR including how the numbers were predicted and where they are located for reference Provide education and explanation of THRR including  how the numbers were predicted and where they are located for reference     Expected Outcomes Short Term: Able to state/look up THRR;Long Term: Able to use THRR to govern intensity when exercising independently;Short Term: Able to use daily as guideline for intensity in rehab Short Term: Able to state/look up THRR;Long Term: Able to use THRR to govern intensity when exercising independently;Short Term: Able to use daily as guideline for intensity in rehab Short Term: Able to state/look up THRR;Long Term: Able to use THRR to govern intensity when exercising independently;Short Term: Able to use daily as guideline for intensity in rehab     Understanding of Exercise Prescription Yes Yes Yes     Intervention Provide education, explanation, and written materials on patient's individual exercise prescription Provide education, explanation, and written materials on patient's individual exercise prescription Provide education, explanation, and written materials on patient's individual exercise prescription     Expected Outcomes Short Term: Able to explain program exercise prescription;Long Term: Able to explain home exercise prescription to exercise independently Short Term: Able to explain  program exercise prescription;Long Term: Able to explain home exercise prescription to exercise independently Short Term: Able to explain program exercise prescription;Long Term: Able to explain home exercise prescription to exercise independently              Exercise Goals Re-Evaluation :  Exercise Goals Re-Evaluation     Row Name 10/28/21 0940 11/18/21 0737           Exercise Goal Re-Evaluation   Exercise Goals Review Increase Physical Activity;Increase Strength and Stamina;Able to understand and use rate of perceived exertion (RPE) scale;Able to understand and use Dyspnea scale;Knowledge and understanding of Target Heart Rate Range (THRR);Understanding of Exercise Prescription Increase Physical Activity;Increase  Strength and Stamina;Able to understand and use rate of perceived exertion (RPE) scale;Able to understand and use Dyspnea scale;Knowledge and understanding of Target Heart Rate Range (THRR);Understanding of Exercise Prescription      Comments Pt is scheduled to begin exercise next week. Will continue to monitor and progress as able. Jantz has completed 4 exercise sessions. He exercises for 15 min on the treadmill and Nustep. He averages 2.53 METs at 2.0 mph on the treadmill and 2.9 METs at level 5 on the Nustep. He performs the warmup and cooldown standing without limitations. Sion has progressed from track walking to the treadmill. He tolerates the treamill well as my goal is to increase his workload on the treadmill. Yates seems to enjoy rehab and wants to improve his functional capacity. Will continue to monitor and progress as able.      Expected Outcomes Through exercise at rehab and home, the patient will decrease shortness of breath with daily activities and feel confident in carrying out an exercise regimen at home. Through exercise at rehab and home, the patient will decrease shortness of breath with daily activities and feel confident in carrying out an exercise regimen at home.               Discharge Exercise Prescription (Final Exercise Prescription Changes):  Exercise Prescription Changes - 11/13/21 1506       Response to Exercise   Blood Pressure (Admit) 112/68    Blood Pressure (Exit) 134/70    Heart Rate (Admit) 78 bpm    Heart Rate (Exercise) 98 bpm    Heart Rate (Exit) 89 bpm    Oxygen Saturation (Admit) 95 %    Oxygen Saturation (Exercise) 95 %    Oxygen Saturation (Exit) 93 %    Rating of Perceived Exertion (Exercise) 11    Perceived Dyspnea (Exercise) 1    Duration Continue with 30 min of aerobic exercise without signs/symptoms of physical distress.    Intensity THRR unchanged      Resistance Training   Training Prescription Yes    Weight blue bands    Reps 10-15     Time 10 Minutes      Treadmill   MPH 2    Grade 0    Minutes 15    METs 2.53      NuStep   Level 5    SPM 70    Minutes 15    METs 2.9             Nutrition:  Target Goals: Understanding of nutrition guidelines, daily intake of sodium <1531m, cholesterol <2026m calories 30% from fat and 7% or less from saturated fats, daily to have 5 or more servings of fruits and vegetables.  Biometrics:    Nutrition Therapy Plan and Nutrition Goals:  Nutrition Therapy & Goals -  11/13/21 1531       Nutrition Therapy   Diet Heart Healthy diet    Drug/Food Interactions Statins/Certain Fruits    Protein (specify units) 81-101g (1.2-1.5g/kg ideal body weight)      Personal Nutrition Goals   Nutrition Goal Patient to understand strategies for weight gain of 0.5-2.0# per week.    Personal Goal #2 Patient to choose a variety of lean protein/plant protein, fruits, vegetables, whole grains, and dairy as part of heart healthy lifestyle    Comments Patient implemented [employer] Breakfast (220 calories, 13g protein) 1x/day. He is attempting to eat three meals per day and 2-3 snacks to aid with weight gain. Patient remains very motivated and excited about nutrition goals.      Intervention Plan   Intervention Prescribe, educate and counsel regarding individualized specific dietary modifications aiming towards targeted core components such as weight, hypertension, lipid management, diabetes, heart failure and other comorbidities.;Nutrition handout(s) given to patient.    Expected Outcomes Long Term Goal: Adherence to prescribed nutrition plan.;Short Term Goal: Understand basic principles of dietary content, such as calories, fat, sodium, cholesterol and nutrients.             Nutrition Assessments:  Nutrition Assessments - 11/06/21 1519       Rate Your Plate Scores   Pre Score 36            MEDIFICTS Score Key: ?70 Need to make dietary changes  40-70 Heart Healthy  Diet ? 40 Therapeutic Level Cholesterol Diet  Flowsheet Row PULMONARY REHAB CHRONIC OBSTRUCTIVE PULMONARY DISEASE from 11/06/2021 in [place]  Picture Your Plate Total Score on Admission 36      Picture Your Plate Scores: <85 Unhealthy dietary pattern with much room for improvement. 41-50 Dietary pattern unlikely to meet recommendations for good health and room for improvement. 51-60 More healthful dietary pattern, with some room for improvement.  >60 Healthy dietary pattern, although there may be some specific behaviors that could be improved.    Nutrition Goals Re-Evaluation:   Nutrition Goals Discharge (Final Nutrition Goals Re-Evaluation):   Psychosocial: Target Goals: Acknowledge presence or absence of significant depression and/or stress, maximize coping skills, provide positive support system. Participant is able to verbalize types and ability to use techniques and skills needed for reducing stress and depression.  Initial Review & Psychosocial Screening:  Initial Psych Review & Screening - 10/27/21 1049       Initial Review   Current issues with None Identified      Family Dynamics   Good Support System? Yes    Comments long-term friend      Barriers   Psychosocial barriers to participate in program There are no identifiable barriers or psychosocial needs.      Screening Interventions   Interventions Encouraged to exercise             Quality of Life Scores:  Scores of 19 and below usually indicate a poorer quality of life in these areas.  A difference of  2-3 points is a clinically meaningful difference.  A difference of 2-3 points in the total score of the Quality of Life Index has been associated with significant improvement in overall quality of life, self-image, physical symptoms, and general health in studies assessing change in quality of life.  PHQ-9: Review Flowsheet  More data exists      10/27/2021 06/23/2021  04/14/2021 06/05/2020 11/27/2019  Depression screen PHQ 2/9  Decreased Interest 0 0 0 0  0  Down, Depressed, Hopeless 0 0 1 0 0  PHQ - 2 Score 0 0 1 0 0  Altered sleeping 0 - - - -  Tired, decreased energy 0 - - - -  Change in appetite 0 - - - -  Feeling bad or failure about yourself  0 - - - -  Trouble concentrating 0 - - - -  Moving slowly or fidgety/restless 0 - - - -  Suicidal thoughts 0 - - - -  PHQ-9 Score 0 - - - -   Interpretation of Total Score  Total Score Depression Severity:  1-4 = Minimal depression, 5-9 = Mild depression, 10-14 = Moderate depression, 15-19 = Moderately severe depression, 20-27 = Severe depression   Psychosocial Evaluation and Intervention:  Psychosocial Evaluation - 10/27/21 1049       Psychosocial Evaluation & Interventions   Interventions Encouraged to exercise with the program and follow exercise prescription    Expected Outcomes For Maninder to participate in Pulmonary Rehab    Continue Psychosocial Services  Follow up required by counselor             Psychosocial Re-Evaluation:  Psychosocial Re-Evaluation     [place] Name 10/28/21 0933 11/18/21 0940           Psychosocial Re-Evaluation   Current issues with None Identified None Identified      Comments No changes in psychosocial assessment since orientation 10/27/2021 which was yesterday. Will evaluate every 30 days. Very positive attitude, outgoing, no signs of depression or concerns identified at this time.      Expected Outcomes For Beniah to continue to have no barriers or psychosocial concerns while participating in pulmonary rehab. For [place]on to continue to be free of barriers or psychosocial concerns while participating in pulmonary rehab.      Interventions Encouraged to attend Pulmonary Rehabilitation for the exercise --      Continue Psychosocial Services  No Follow up required No Follow up required               Psychosocial Discharge (Final Psychosocial Re-Evaluation):   Psychosocial Re-Evaluation - 11/18/21 0940       Psychosocial Re-Evaluation   Current issues with None Identified    Comments Very positive attitude, outgoing, no signs of depression or concerns identified at this time.    Expected Outcomes For Thedore to continue to be free of barriers or psychosocial concerns while participating in pulmonary rehab.    Continue Psychosocial Services  No Follow up required             Education: Education Goals: Education classes will be provided on a weekly basis, covering required topics. Participant will state understanding/return demonstration of topics presented.  Learning Barriers/Preferences:  Learning Barriers/Preferences - 10/27/21 1247       Learning Barriers/Preferences   Learning Barriers Sight    Learning Preferences None             Education Topics: Risk Factor Reduction:  -Group instruction that is supported by a PowerPoint presentation. Instructor discusses the definition of a risk factor, different risk factors for pulmonary disease, and how the heart and lungs work together.     Nutrition for Pulmonary Patient:  -Group instruction provided by PowerPoint slides, verbal discussion, and written materials to support subject matter. The instructor gives an explanation and review of healthy diet recommendations, which includes a discussion on weight management, recommendations for fruit and vegetable consumption, as well as protein, fluid, caffeine, fiber,  sodium, sugar, and alcohol. Tips for eating when patients are short of breath are discussed.   Pursed Lip Breathing:  -Group instruction that is supported by demonstration and informational handouts. Instructor discusses the benefits of pursed lip and diaphragmatic breathing and detailed demonstration on how to preform both.     Oxygen Safety:  -Group instruction provided by PowerPoint, verbal discussion, and written material to support subject matter. There is an overview  of "What is Oxygen" and "Why do we need it".  Instructor also reviews how to create a safe environment for oxygen use, the importance of using oxygen as prescribed, and the risks of noncompliance. There is a brief discussion on traveling with oxygen and resources the patient may utilize. Flowsheet Row PULMONARY REHAB CHRONIC OBSTRUCTIVE PULMONARY DISEASE from 11/13/2021 in [place]  Date 11/13/21  Educator Donnetta Simpers  Instruction Review Code 1- Verbalizes Understanding       Oxygen Equipment:  -Group instruction provided by [employer] Staff utilizing handouts, written materials, and Insurance underwriter. Flowsheet Row PULMONARY REHAB CHRONIC OBSTRUCTIVE PULMONARY DISEASE from 11/06/2021 in [place]  Date 11/06/21  Educator [place]ace [place]  Instruction Review Code 1- Verbalizes Understanding       Signs and Symptoms:  -Group instruction provided by written material and verbal discussion to support subject matter. Warning signs and symptoms of infection, stroke, and heart attack are reviewed and when to call the physician/911 reinforced. Tips for preventing the spread of infection discussed.   Advanced Directives:  -Group instruction provided by verbal instruction and written material to support subject matter. Instructor reviews Advanced Directive laws and proper instruction for filling out document.   Pulmonary Video:  -Group video education that reviews the importance of medication and oxygen compliance, exercise, good nutrition, pulmonary hygiene, and pursed lip and diaphragmatic breathing for the pulmonary patient.   Exercise for the Pulmonary Patient:  -Group instruction that is supported by a PowerPoint presentation. Instructor discusses benefits of exercise, core components of exercise, frequency, duration, and intensity of an exercise routine, importance of utilizing pulse oximetry during exercise, safety while exercising,  and options of places to exercise outside of rehab.     Pulmonary Medications:  -Verbally interactive group education provided by instructor with focus on inhaled medications and proper administration.   Anatomy and Physiology of the Respiratory System and Intimacy:  -Group instruction provided by PowerPoint, verbal discussion, and written material to support subject matter. Instructor reviews respiratory cycle and anatomical components of the respiratory system and their functions. Instructor also reviews differences in obstructive and restrictive respiratory diseases with examples of each. Intimacy, Sex, and Sexuality differences are reviewed with a discussion on how relationships can change when diagnosed with pulmonary disease. Common sexual concerns are reviewed.   MD DAY -A group question and answer session with a medical doctor that allows participants to ask questions that relate to their pulmonary disease state.   OTHER EDUCATION -Group or individual verbal, written, or video instructions that support the educational goals of the pulmonary rehab program.   Holiday Eating Survival Tips:  -Group instruction provided by PowerPoint slides, verbal discussion, and written materials to support subject matter. The instructor gives patients tips, tricks, and techniques to help them not only survive but enjoy the holidays despite the onslaught of food that accompanies the holidays.   Knowledge Questionnaire Score:  Knowledge Questionnaire Score - 10/27/21 1050       Knowledge Questionnaire Score   Pre Score 17/18  Core Components/Risk Factors/Patient Goals at Admission:  Personal Goals and Risk Factors at Admission - 10/27/21 1050       Core Components/Risk Factors/Patient Goals on Admission    Weight Management Weight Gain    Improve shortness of breath with ADL's Yes    Intervention Provide education, individualized exercise plan and daily activity instruction  to help decrease symptoms of SOB with activities of daily living.    Expected Outcomes Short Term: Improve cardiorespiratory fitness to achieve a reduction of symptoms when performing ADLs;Long Term: Be able to perform more ADLs without symptoms or delay the onset of symptoms             Core Components/Risk Factors/Patient Goals Review:   Goals and Risk Factor Review     Row Name 10/28/21 3606 11/18/21 0942           Core Components/Risk Factors/Patient Goals Review   Personal Goals Review Weight Management/Obesity;Develop more efficient breathing techniques such as purse lipped breathing and diaphragmatic breathing and practicing self-pacing with activity.;Increase knowledge of respiratory medications and ability to use respiratory devices properly.;Improve shortness of breath with ADL's Weight Management/Obesity;Improve shortness of breath with ADL's;Increase knowledge of respiratory medications and ability to use respiratory devices properly.;Develop more efficient breathing techniques such as purse lipped breathing and diaphragmatic breathing and practicing self-pacing with activity.      Review Has not started the program yet, will reevaluate in the next 30 days. Luisdavid has attended 4 exercise sessions, has gained 1 kg, he is underweight and trying to gain.  The dietician is working with him in this area, he is receptive to her ideas.  He is exercising @ 2.9 mets on the nustep and will be increased today to 2.2 mph and 1% incline on the treadmill.  He is high functioning.      Expected Outcomes See admission goals. See admission goals.               Core Components/Risk Factors/Patient Goals at Discharge (Final Review):   Goals and Risk Factor Review - 11/18/21 0942       Core Components/Risk Factors/Patient Goals Review   Personal Goals Review Weight Management/Obesity;Improve shortness of breath with ADL's;Increase knowledge of respiratory medications and ability to use  respiratory devices properly.;Develop more efficient breathing techniques such as purse lipped breathing and diaphragmatic breathing and practicing self-pacing with activity.    Review Rondrick has attended 4 exercise sessions, has gained 1 kg, he is underweight and trying to gain.  The dietician is working with him in this area, he is receptive to her ideas.  He is exercising @ 2.9 mets on the nustep and will be increased today to 2.2 mph and 1% incline on the treadmill.  He is high functioning.    Expected Outcomes See admission goals.             ITP Comments: Pt is making expected progress toward Pulmonary Rehab goals after completing 4 sessions. Recommend continued exercise, life style modification, education, and utilization of breathing techniques to increase stamina and strength, while also decreasing shortness of breath with exertion.  Dr. Rodman Pickle is Medical Director for Pulmonary Rehab at [facility]

## 2021-11-27 ENCOUNTER — Encounter (HOSPITAL_COMMUNITY): Payer: Medicare HMO

## 2021-11-28 ENCOUNTER — Other Ambulatory Visit: Payer: Self-pay | Admitting: Internal Medicine

## 2021-12-02 ENCOUNTER — Encounter (HOSPITAL_COMMUNITY): Payer: Medicare HMO

## 2021-12-02 ENCOUNTER — Telehealth (HOSPITAL_COMMUNITY): Payer: Self-pay

## 2021-12-04 ENCOUNTER — Encounter (HOSPITAL_COMMUNITY): Payer: Medicare HMO

## 2021-12-11 ENCOUNTER — Encounter (HOSPITAL_COMMUNITY): Payer: Medicare HMO

## 2021-12-11 NOTE — Addendum Note (Signed)
Encounter addended by: Deon Pilling, RN on: 12/11/2021 4:31 PM  Actions taken: Clinical Note Signed

## 2021-12-11 NOTE — Progress Notes (Signed)
Discharge Progress Report  Patient Details  Name: Leslie Cardenas MRN: [id number] Date of Birth: 1940/11/21 Referring Provider:   [facility] Walk Test from 10/27/2021 in [place]  Referring Provider Trion        Number of Visits: 4  Reason for Discharge:  Early Exit:  Lack of attendance  Smoking History:  Social History   Tobacco Use  Smoking Status Former   Packs/day: 3.00   Years: 20.00   Total pack years: 60.00   Types: Cigarettes   Quit date: 04/11/2000   Years since quitting: 21.6  Smokeless Tobacco Never    Diagnosis:  Stage 3 severe COPD by GOLD classification ([place])  ADL [facility]:  Pulmonary Assessment Scores     Row Name 10/27/21 1059         ADL [facility]SD   ADL Phase Entry     SOB Score total 34       CAT Score   CAT Score 38       mMRC Score   mMRC Score 4              Initial Exercise Prescription:  Initial Exercise Prescription - 10/27/21 1200       Date of Initial Exercise RX and Referring Provider   Date 10/27/21    Referring Provider Meier    Expected Discharge Date 01/01/22      NuStep   Level 1    SPM 70    Minutes 15      Track   Minutes 15    METs 2.78      Prescription Details   Frequency (times per week) .2    Duration Progress to 30 minutes of continuous aerobic without signs/symptoms of physical distress      Intensity   THRR 40-80% of Max Heartrate 56-111    Ratings of Perceived Exertion 11-13    Perceived Dyspnea 0-4      Progression   Progression Continue to progress workloads to maintain intensity without signs/symptoms of physical distress.      Resistance Training   Training Prescription Yes    Weight blue bands    Reps 10-15             Discharge Exercise Prescription (Final Exercise Prescription Changes):  Exercise Prescription Changes - 11/13/21 1506       Response to Exercise   Blood Pressure (Admit) 112/68    Blood Pressure (Exit) 134/70     Heart Rate (Admit) 78 bpm    Heart Rate (Exercise) 98 bpm    Heart Rate (Exit) 89 bpm    Oxygen Saturation (Admit) 95 %    Oxygen Saturation (Exercise) 95 %    Oxygen Saturation (Exit) 93 %    Rating of Perceived Exertion (Exercise) 11    Perceived Dyspnea (Exercise) 1    Duration Continue with 30 min of aerobic exercise without signs/symptoms of physical distress.    Intensity THRR unchanged      Resistance Training   Training Prescription Yes    Weight blue bands    Reps 10-15    Time 10 Minutes      Treadmill   MPH 2    Grade 0    Minutes 15    METs 2.53      NuStep   Level 5    SPM 70    Minutes 15    METs 2.9  Functional Capacity:  6 Minute Walk     Row Name 10/27/21 1240         6 Minute Walk   Phase Initial     Distance 1315 feet     Walk Time 6 minutes     # of Rest Breaks 0     MPH 2.49     METS 2.78     RPE 11     Perceived Dyspnea  1     VO2 Peak 9.73     Symptoms No     Resting HR 70 bpm     Resting BP 136/70     Resting Oxygen Saturation  97 %     Exercise Oxygen Saturation  during 6 min walk 96 %     Max Ex. HR 98 bpm     Max Ex. BP 132/60     2 Minute Post BP 124/60       Interval HR   1 Minute HR 79     2 Minute HR 84     3 Minute HR 84     4 Minute HR 83     5 Minute HR 86     6 Minute HR 98     2 Minute Post HR 69     Interval Heart Rate? Yes       Interval Oxygen   Interval Oxygen? Yes     Baseline Oxygen Saturation % 97 %     1 Minute Oxygen Saturation % 96 %     1 Minute Liters of Oxygen 0 L     2 Minute Oxygen Saturation % 97 %     2 Minute Liters of Oxygen 0 L     3 Minute Oxygen Saturation % 96 %     3 Minute Liters of Oxygen 0 L     4 Minute Oxygen Saturation % 96 %     4 Minute Liters of Oxygen 0 L     5 Minute Oxygen Saturation % 98 %     5 Minute Liters of Oxygen 0 L     6 Minute Oxygen Saturation % 97 %     6 Minute Liters of Oxygen 0 L     2 Minute Post Oxygen Saturation % 96 %     2  Minute Post Liters of Oxygen 0 L              Psychological, QOL, Others - Outcomes: PHQ 2/9:    10/27/2021   10:47 AM 06/23/2021   10:09 PM 04/14/2021    9:58 AM 06/05/2020   10:30 PM 11/27/2019    9:30 PM  Depression screen PHQ 2/9  Decreased Interest 0 0 0 0 0  Down, Depressed, Hopeless 0 0 1 0 0  PHQ - 2 Score 0 0 1 0 0  Altered sleeping 0      Tired, decreased energy 0      Change in appetite 0      Feeling bad or failure about yourself  0      Trouble concentrating 0      Moving slowly or fidgety/restless 0      Suicidal thoughts 0      PHQ-9 Score 0        Quality of Life:   Personal Goals: Goals established at orientation with interventions provided to work toward goal.  Personal Goals and Risk Factors at Admission - 10/27/21 1050  Core Components/Risk Factors/Patient Goals on Admission    Weight Management Weight Gain    Improve shortness of breath with ADL's Yes    Intervention Provide education, individualized exercise plan and daily activity instruction to help decrease symptoms of SOB with activities of daily living.    Expected Outcomes Short Term: Improve cardiorespiratory fitness to achieve a reduction of symptoms when performing ADLs;Long Term: Be able to perform more ADLs without symptoms or delay the onset of symptoms              Personal Goals Discharge:  Goals and Risk Factor Review     Row Name 10/28/21 8937 11/18/21 0942           Core Components/Risk Factors/Patient Goals Review   Personal Goals Review Weight Management/Obesity;Develop more efficient breathing techniques such as purse lipped breathing and diaphragmatic breathing and practicing self-pacing with activity.;Increase knowledge of respiratory medications and ability to use respiratory devices properly.;Improve shortness of breath with ADL's Weight Management/Obesity;Improve shortness of breath with ADL's;Increase knowledge of respiratory medications and ability to use  respiratory devices properly.;Develop more efficient breathing techniques such as purse lipped breathing and diaphragmatic breathing and practicing self-pacing with activity.      Review Has not started the program yet, will reevaluate in the next 30 days. Leslie Cardenas has attended 4 exercise sessions, has gained 1 kg, Leslie Cardenas is underweight and trying to gain.  The dietician is working with him in this area, Leslie Cardenas is receptive to her ideas.  Leslie Cardenas is exercising @ 2.9 mets on the nustep and will be increased today to 2.2 mph and 1% incline on the treadmill.  Leslie Cardenas is high functioning.      Expected Outcomes See admission goals. See admission goals.               Exercise Goals and Review:  Exercise Goals     Row Name 10/27/21 1248 10/28/21 0940 11/18/21 0736         Exercise Goals   Increase Physical Activity Yes Yes Yes     Intervention Provide advice, education, support and counseling about physical activity/exercise needs.;Develop an individualized exercise prescription for aerobic and resistive training based on initial evaluation findings, risk stratification, comorbidities and participant's personal goals. Provide advice, education, support and counseling about physical activity/exercise needs.;Develop an individualized exercise prescription for aerobic and resistive training based on initial evaluation findings, risk stratification, comorbidities and participant's personal goals. Provide advice, education, support and counseling about physical activity/exercise needs.;Develop an individualized exercise prescription for aerobic and resistive training based on initial evaluation findings, risk stratification, comorbidities and participant's personal goals.     Expected Outcomes Short Term: Attend rehab on a regular basis to increase amount of physical activity.;Long Term: Add in home exercise to make exercise part of routine and to increase amount of physical activity.;Long Term: Exercising regularly at least  3-5 days a week. Short Term: Attend rehab on a regular basis to increase amount of physical activity.;Long Term: Add in home exercise to make exercise part of routine and to increase amount of physical activity.;Long Term: Exercising regularly at least 3-5 days a week. Short Term: Attend rehab on a regular basis to increase amount of physical activity.;Long Term: Add in home exercise to make exercise part of routine and to increase amount of physical activity.;Long Term: Exercising regularly at least 3-5 days a week.     Increase Strength and Stamina Yes Yes Yes     Intervention Provide advice, education, support and counseling about  physical activity/exercise needs.;Develop an individualized exercise prescription for aerobic and resistive training based on initial evaluation findings, risk stratification, comorbidities and participant's personal goals. Provide advice, education, support and counseling about physical activity/exercise needs.;Develop an individualized exercise prescription for aerobic and resistive training based on initial evaluation findings, risk stratification, comorbidities and participant's personal goals. Provide advice, education, support and counseling about physical activity/exercise needs.;Develop an individualized exercise prescription for aerobic and resistive training based on initial evaluation findings, risk stratification, comorbidities and participant's personal goals.     Expected Outcomes Short Term: Increase workloads from initial exercise prescription for resistance, speed, and METs.;Short Term: Perform resistance training exercises routinely during rehab and add in resistance training at home;Long Term: Improve cardiorespiratory fitness, muscular endurance and strength as measured by increased METs and functional capacity (6MWT) Short Term: Increase workloads from initial exercise prescription for resistance, speed, and METs.;Short Term: Perform resistance training exercises  routinely during rehab and add in resistance training at home;Long Term: Improve cardiorespiratory fitness, muscular endurance and strength as measured by increased METs and functional capacity (6MWT) Short Term: Increase workloads from initial exercise prescription for resistance, speed, and METs.;Short Term: Perform resistance training exercises routinely during rehab and add in resistance training at home;Long Term: Improve cardiorespiratory fitness, muscular endurance and strength as measured by increased METs and functional capacity (6MWT)     Able to understand and use rate of perceived exertion (RPE) scale Yes Yes Yes     Intervention Provide education and explanation on how to use RPE scale Provide education and explanation on how to use RPE scale Provide education and explanation on how to use RPE scale     Expected Outcomes Short Term: Able to use RPE daily in rehab to express subjective intensity level;Long Term:  Able to use RPE to guide intensity level when exercising independently Short Term: Able to use RPE daily in rehab to express subjective intensity level;Long Term:  Able to use RPE to guide intensity level when exercising independently Short Term: Able to use RPE daily in rehab to express subjective intensity level;Long Term:  Able to use RPE to guide intensity level when exercising independently     Able to understand and use Dyspnea scale Yes Yes Yes     Intervention Provide education and explanation on how to use Dyspnea scale Provide education and explanation on how to use Dyspnea scale Provide education and explanation on how to use Dyspnea scale     Expected Outcomes Short Term: Able to use Dyspnea scale daily in rehab to express subjective sense of shortness of breath during exertion;Long Term: Able to use Dyspnea scale to guide intensity level when exercising independently Short Term: Able to use Dyspnea scale daily in rehab to express subjective sense of shortness of breath during  exertion;Long Term: Able to use Dyspnea scale to guide intensity level when exercising independently Short Term: Able to use Dyspnea scale daily in rehab to express subjective sense of shortness of breath during exertion;Long Term: Able to use Dyspnea scale to guide intensity level when exercising independently     Knowledge and understanding of Target Heart Rate Range (THRR) Yes Yes Yes     Intervention Provide education and explanation of THRR including how the numbers were predicted and where they are located for reference Provide education and explanation of THRR including how the numbers were predicted and where they are located for reference Provide education and explanation of THRR including how the numbers were predicted and where they are located for  reference     Expected Outcomes Short Term: Able to state/look up THRR;Long Term: Able to use THRR to govern intensity when exercising independently;Short Term: Able to use daily as guideline for intensity in rehab Short Term: Able to state/look up THRR;Long Term: Able to use THRR to govern intensity when exercising independently;Short Term: Able to use daily as guideline for intensity in rehab Short Term: Able to state/look up THRR;Long Term: Able to use THRR to govern intensity when exercising independently;Short Term: Able to use daily as guideline for intensity in rehab     Understanding of Exercise Prescription Yes Yes Yes     Intervention Provide education, explanation, and written materials on patient's individual exercise prescription Provide education, explanation, and written materials on patient's individual exercise prescription Provide education, explanation, and written materials on patient's individual exercise prescription     Expected Outcomes Short Term: Able to explain program exercise prescription;Long Term: Able to explain home exercise prescription to exercise independently Short Term: Able to explain program exercise  prescription;Long Term: Able to explain home exercise prescription to exercise independently Short Term: Able to explain program exercise prescription;Long Term: Able to explain home exercise prescription to exercise independently              Exercise Goals Re-Evaluation:  Exercise Goals Re-Evaluation     Row Name 10/28/21 0940 11/18/21 0737           Exercise Goal Re-Evaluation   Exercise Goals Review Increase Physical Activity;Increase Strength and Stamina;Able to understand and use rate of perceived exertion (RPE) scale;Able to understand and use Dyspnea scale;Knowledge and understanding of Target Heart Rate Range (THRR);Understanding of Exercise Prescription Increase Physical Activity;Increase Strength and Stamina;Able to understand and use rate of perceived exertion (RPE) scale;Able to understand and use Dyspnea scale;Knowledge and understanding of Target Heart Rate Range (THRR);Understanding of Exercise Prescription      Comments Pt is scheduled to begin exercise next week. Will continue to monitor and progress as able. Deondra has completed 4 exercise sessions. Leslie Cardenas exercises for 15 min on the treadmill and Nustep. Leslie Cardenas averages 2.53 METs at 2.0 mph on the treadmill and 2.9 METs at level 5 on the Nustep. Leslie Cardenas performs the warmup and cooldown standing without limitations. Carsin has progressed from track walking to the treadmill. Leslie Cardenas tolerates the treamill well as my goal is to increase his workload on the treadmill. Damarious seems to enjoy rehab and wants to improve his functional capacity. Will continue to monitor and progress as able.      Expected Outcomes Through exercise at rehab and home, the patient will decrease shortness of breath with daily activities and feel confident in carrying out an exercise regimen at home. Through exercise at rehab and home, the patient will decrease shortness of breath with daily activities and feel confident in carrying out an exercise regimen at home.                Nutrition & Weight - Outcomes:    Nutrition:  Nutrition Therapy & Goals - 11/13/21 1531       Nutrition Therapy   Diet Heart Healthy diet    Drug/Food Interactions Statins/Certain Fruits    Protein (specify units) 81-101g (1.2-1.5g/kg ideal body weight)      Personal Nutrition Goals   Nutrition Goal Patient to understand strategies for weight gain of 0.5-2.0# per week.    Personal Goal #2 Patient to choose a variety of lean protein/plant protein, fruits, vegetables, whole grains, and dairy as part  of heart healthy lifestyle    Comments Patient implemented [employer] (220 calories, 13g protein) 1x/day. Leslie Cardenas is attempting to eat three meals per day and 2-3 snacks to aid with weight gain. Patient remains very motivated and excited about nutrition goals.      Intervention Plan   Intervention Prescribe, educate and counsel regarding individualized specific dietary modifications aiming towards targeted core components such as weight, hypertension, lipid management, diabetes, heart failure and other comorbidities.;Nutrition handout(s) given to patient.    Expected Outcomes Long Term Goal: Adherence to prescribed nutrition plan.;Short Term Goal: Understand basic principles of dietary content, such as calories, fat, sodium, cholesterol and nutrients.             Nutrition Discharge:  Nutrition Assessments - 11/06/21 1519       Rate Your Plate Scores   Pre Score 36             Education Questionnaire Score:  Knowledge Questionnaire Score - 10/27/21 1050       Knowledge Questionnaire Score   Pre Score 17/18             Goals reviewed with patient; copy given to patient.

## 2021-12-11 NOTE — Progress Notes (Signed)
Called Ogle today since he did not attend [facility]. He answered the telephone when I called and as soon as I announced who I was, the phone call ended. We will discharge him from the program.

## 2021-12-11 NOTE — Progress Notes (Signed)
Called pt today 12/11/21. Leslie Cardenas answered the telephone. When I announced who I was and where I was from the phone call ended. We will discharge him today since 11/13/21 was the last day that he attended.I also mailed him a letter that we would be discharging him from the [facility] program.

## 2021-12-11 NOTE — Addendum Note (Signed)
Encounter addended by: Deon Pilling, RN on: 12/11/2021 4:03 PM  Actions taken: Clinical Note Signed, Episode resolved

## 2021-12-16 ENCOUNTER — Encounter (HOSPITAL_COMMUNITY): Payer: Medicare HMO

## 2021-12-18 ENCOUNTER — Encounter (HOSPITAL_COMMUNITY): Payer: Medicare HMO

## 2021-12-18 DIAGNOSIS — C44212 Basal cell carcinoma of skin of right ear and external auricular canal: Secondary | ICD-10-CM | POA: Diagnosis not present

## 2021-12-18 DIAGNOSIS — C44311 Basal cell carcinoma of skin of nose: Secondary | ICD-10-CM | POA: Diagnosis not present

## 2021-12-18 DIAGNOSIS — Z85828 Personal history of other malignant neoplasm of skin: Secondary | ICD-10-CM | POA: Diagnosis not present

## 2021-12-23 ENCOUNTER — Encounter (HOSPITAL_COMMUNITY): Payer: Medicare HMO

## 2021-12-25 ENCOUNTER — Encounter (HOSPITAL_COMMUNITY): Payer: Medicare HMO

## 2021-12-30 ENCOUNTER — Encounter: Payer: Self-pay | Admitting: Internal Medicine

## 2021-12-30 ENCOUNTER — Encounter (HOSPITAL_COMMUNITY): Payer: Medicare HMO

## 2021-12-30 NOTE — Patient Instructions (Signed)

## 2021-12-30 NOTE — Progress Notes (Unsigned)
Future Appointments  Date Time Provider Department  12/31/2021             6 mo ov  9:30 AM Unk Pinto, MD [facility]  01/13/2022  9:45 AM Maryjane Hurter, MD [facility]  04/13/2022              wellness 11:00 AM Alycia Rossetti, NP [facility]  06/29/2022              cpe 11:00 AM Unk Pinto, MD [facility]    History of Present Illness:      This very nice 81 y.o. DWM presents for  6 month follow up with HTN, HLD, COPD, Pre-Diabetes and Vitamin D Deficiency. 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  CXR in Feb 2020 showed Thoracic Aortic atherosclerosis. 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  He is on daily maintenance of nebulized bronchodilator. He endorses recent exacerbation of  mucopurulent sputum secretions.        Patient is treated for HTN circa 1980 & BPs  have been controlled at home. Today's BP is    140/76   .  Patient has hx/o a MI at age 19 in 57.  In 2013, he had a negative Lexiscan. 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   Patient has had no complaints of any cardiac type chest pain, palpitations, dyspnea / orthopnea / PND, dizziness, claudication, or dependent edema.       Hyperlipidemia is controlled with diet & Rosuvastatin /Ezetimibe . Patient denies myalgias or other med SE's. Last Lipids were not at goal:  Lab Results  Component Value Date   CHOL 197 09/30/2021   HDL 43 09/30/2021   LDLCALC 110 (H) 09/30/2021   TRIG 328 (H) 09/30/2021   CHOLHDL 4.6 09/30/2021    Also, the patient has history of PreDiabetes (A1c 5.9% /2011 & 6.4% /2016) and has had no symptoms of reactive hypoglycemia, diabetic polys, paresthesias or visual blurring.  Last A1c was not at goal:  Lab Results  Component Value Date   HGBA1C 5.9 (H) 06/24/2021                                                       Further, the patient also has history of Vitamin D Deficiency ("24" /2008) and supplements vitamin D without any suspected  side-effects. Last vitamin D was still low:  Lab Results  Component Value Date   VD25OH 34 09/30/2021      Current Outpatient Medications on File Prior to Visit  Medication Sig   albuterol (2.5 MG/3ML)  neb soln Inhale 1 vial by Nebulizer 4 x /day or every 4 hr to rescue Asthma   albuterol HFA  inhaler Inhale 2 puffs every 4 hours as needed   calcium carbonate  600 MG TABS  Take 1 tablet  2 times daily with a meal.   Ensure  Take 237 mLs  Drinks one with his meals   ezetimibe  10 MG tablet Take 1 tablet daily.   lidocaine 2 % solution Use as directed 15 mLsas needed for mouth pain.   loratadine 10 MG tablet TAKE 1 TABLET  DAILY   mirtazapine 15 MG tablet Take one tablet at bedtime   NITROSTAT 0.4 MG SL  tablet as needed for chest pain.   olmesartan-HCT 20-12.5 MG tab TAKE 1 TABLET DAILY    omeprazole  40 MG capsule TAKE 1 CAPSULE DAILY    oxybutynin XL  10 MG 24 hr  TAKE 1 TABLET DAILY    predniSONE 5 MG tablet TAKE 1 TABLET  TWICE A DAY    rosuvastatin  20 MG tablet TAKE 1 TABLET EVERY DAY    tadalafil   20 MG tablet Take 1/2 to 1 tablet every 2 to 3 days if needed for XXXX   tiZANidine  4 MG tablet TAKE 1 TABLET 3 X /DAY FOR BACK PAIN & MUSCLE SPASMS   VITAMIN D 5,000 Units  Take  daily.   No Known Allergies  PMHx:   Past Medical History:  Diagnosis Date   Aortic atherosclerosis ([place]) 03/04/2018   Noted on CT Abd/pelvis   Cataract    Bilateral   Colon polyp    COPD (chronic obstructive pulmonary disease) (HCC)    Coronary artery disease    Diverticulosis 07/16/2016   SIGMOID, NOTED ON COLONOSCOPY   Esophageal stenosis 02/24/2018   noted on endoscopy   Esophageal stricture    GERD (gastroesophageal reflux disease)    Grade I diastolic dysfunction 46/96/2952   noted on ECHO   History of hiatal hernia 02/24/2018   Small noted on endoscopy   Hydronephrosis    Hydronephrosis of right kidney 03/04/2018   URETERAL STONE 7 MM, NOTED ONJ CT ABD/PELVIS   Hyperlipidemia     Myocardial infarction [facility])    [id number]   Nephrolithiasis    s/p stent placement   Skin cancer    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 NOTED ON CT ABD/PELVIS   Vitamin D deficiency    Wears dentures    upper    Immunization History  Administered Date(s) Administered   Influenza Split 04/08/2012   Influenza, High Dose  03/04/2017   Influenza,inj,Quad PF 05/16/2015, 02/15/2018   Moderna Sars-Covid-2 Vacc 07/15/2019, 08/12/2019   Pneumococcal -13 07/25/2014   Pneumococcal -23 04/08/2012   Pneumococcal-23 06/06/2002   Td 06/30/2011   Tdap 10/21/2019   Zoster, Live 05/22/2008     Past Surgical History:  Procedure Laterality Date   CARDIAC CATHETERIZATION  1983   No intervention performed.    CATARACT EXTRACTION, BILATERAL     COLONOSCOPY     COLONOSCOPY W/ POLYPECTOMY  08/12/2010   Dr. Silvano Rusk   COLONOSCOPY W/ POLYPECTOMY  07/16/2016   CYSTOSCOPY W/ URETERAL STENT PLACEMENT Right 03/04/2018   Procedure: CYSTOSCOPY WITH RETROGRADE PYELOGRAM/URETERAL STENT PLACEMENT;  Surgeon: [name], MD;  Location: [facility];  Service: Urology;  Laterality: Right;   CYSTOSCOPY/URETEROSCOPY/HOLMIUM LASER/STENT PLACEMENT Right 03/16/2018   Procedure: CYSTOSCOPY RIGHT URETEROSCOPY/HOLMIUM LASER/STENT PLACEMENT;  Surgeon: [name], MD;  [facility];  Service: Urology;  Laterality: Right;   ESOPHAGOGASTRODUODENOSCOPY     INGUINAL HERNIA REPAIR     left   right leg amputation  05/14/1977   SHOULDER ARTHROSCOPY Left 2000   UPPER GASTROINTESTINAL ENDOSCOPY  WITH DILATION  02/24/2018    FHx:    Reviewed / unchanged  SHx:    Reviewed / unchanged   Systems Review:  Constitutional: Denies fever, chills, wt changes, headaches, insomnia, fatigue, night sweats, change in appetite. Eyes: Denies redness, blurred vision, diplopia, discharge, itchy, watery eyes.  ENT: Denies discharge, congestion, post  nasal drip, epistaxis, sore  throat, earache, hearing loss, dental pain, tinnitus, vertigo, sinus pain, snoring.  CV: Denies chest pain, palpitations, irregular heartbeat, syncope, dyspnea, diaphoresis, orthopnea, PND, claudication or edema. Respiratory: denies cough, dyspnea, DOE, pleurisy, hoarseness, laryngitis, wheezing.  Gastrointestinal: Denies dysphagia, odynophagia, heartburn, reflux, water brash, abdominal pain or cramps, nausea, vomiting, bloating, diarrhea, constipation, hematemesis, melena, hematochezia  or hemorrhoids. Genitourinary: Denies dysuria, frequency, urgency, nocturia, hesitancy, discharge, hematuria or flank pain. Musculoskeletal: Denies arthralgias, myalgias, stiffness, jt. swelling, pain, limping or strain/sprain.  Skin: Denies pruritus, rash, hives, warts, acne, eczema or change in skin lesion(s). Neuro: No weakness, tremor, incoordination, spasms, paresthesia or pain. Psychiatric: Denies confusion, memory loss or sensory loss. Endo: Denies change in weight, skin or hair change.  Heme/Lymph: No excessive bleeding, bruising or enlarged lymph nodes.  Physical Exam  There were no vitals taken for this visit.  Appears  well nourished, well groomed  and in no distress.  Eyes: PERRLA, EOMs, conjunctiva no swelling or erythema. Sinuses: No frontal/maxillary tenderness ENT/Mouth: EAC's clear, TM's nl w/o erythema, bulging. Nares clear w/o erythema, swelling, exudates. Oropharynx clear without erythema or exudates. Oral hygiene is good. Tongue normal, non obstructing. Hearing intact.  Neck: Supple. Thyroid not palpable. Car 2+/2+ without bruits, nodes or JVD. Chest: Respirations nl with BS clear & equal w/o rales, rhonchi, wheezing or stridor.  Cor: Heart sounds normal w/ regular rate and rhythm without sig. murmurs, gallops, clicks or rubs. Peripheral pulses normal and equal  without edema.  Abdomen: Soft & bowel sounds normal. Non-tender w/o guarding, rebound, hernias,  masses or organomegaly.  Lymphatics: Unremarkable.  Musculoskeletal: Rt BKA w/ prosthesis.  Sl limping gait.  Skin: Warm, dry without exposed rashes, lesions or ecchymosis apparent.  Neuro: Cranial nerves intact, reflexes equal bilaterally. Sensory-motor testing grossly intact. Tendon reflexes grossly intact.  Pysch: Alert & oriented x 3.  Insight and judgement nl & appropriate. No ideations.  Assessment and Plan:   1. Essential hypertension  - CBC with Differential/Platelet - COMPLETE METABOLIC PANEL WITH GFR - Magnesium - TSH  2. Hyperlipidemia, mixed  - Lipid panel - TSH  3. Vitamin D deficiency  - Hemoglobin A1c - Insulin, random  4. Abnormal glucose  - VITAMIN D 25 Hydroxy  5. S/P unilateral BKA (below knee amputation), right ([place])   6. Thoracic aortic atherosclerosis ([place]) by CXR on 07/29/2018  - Lipid panel  7. Medication management  - CBC with Differential/Platelet - COMPLETE METABOLIC PANEL WITH GFR - Magnesium - Lipid panel - TSH - Hemoglobin A1c - Insulin, random - VITAMIN D 25 Hydroxy          Discussed  regular exercise, BP monitoring, weight control to achieve/maintain BMI less than 25 and discussed med and SE's. Recommended labs to assess and monitor clinical status with further disposition pending results of labs.   Also treated AECB with Doxycycline & Decadron pulse Jeni Salles. I discussed the assessment and treatment plan with the patient. The patient was provided an opportunity to ask questions and all were answered. The patient agreed with the plan and demonstrated an understanding of the instructions.  I provided over 30 minutes of exam, counseling, chart review and  complex critical decision making.        The patient was advised to call back or seek an in-person evaluation if the symptoms worsen or if the condition fails to improve as anticipated.   Kirtland Bouchard, MD

## 2021-12-31 ENCOUNTER — Ambulatory Visit (INDEPENDENT_AMBULATORY_CARE_PROVIDER_SITE_OTHER): Payer: Medicare HMO | Admitting: Internal Medicine

## 2021-12-31 ENCOUNTER — Other Ambulatory Visit: Payer: Self-pay | Admitting: Internal Medicine

## 2021-12-31 ENCOUNTER — Encounter: Payer: Self-pay | Admitting: Internal Medicine

## 2021-12-31 VITALS — BP 140/76 | HR 49 | Temp 96.4°F | Resp 16 | Ht 68.0 in | Wt 131.4 lb

## 2021-12-31 DIAGNOSIS — I700 Atherosclerosis of aorta: Secondary | ICD-10-CM

## 2021-12-31 DIAGNOSIS — E782 Mixed hyperlipidemia: Secondary | ICD-10-CM | POA: Diagnosis not present

## 2021-12-31 DIAGNOSIS — Z89511 Acquired absence of right leg below knee: Secondary | ICD-10-CM | POA: Diagnosis not present

## 2021-12-31 DIAGNOSIS — I10 Essential (primary) hypertension: Secondary | ICD-10-CM

## 2021-12-31 DIAGNOSIS — E559 Vitamin D deficiency, unspecified: Secondary | ICD-10-CM

## 2021-12-31 DIAGNOSIS — N419 Inflammatory disease of prostate, unspecified: Secondary | ICD-10-CM

## 2021-12-31 DIAGNOSIS — R7309 Other abnormal glucose: Secondary | ICD-10-CM

## 2021-12-31 DIAGNOSIS — Z79899 Other long term (current) drug therapy: Secondary | ICD-10-CM | POA: Diagnosis not present

## 2021-12-31 MED ORDER — DOXYCYCLINE HYCLATE 100 MG PO CAPS: ORAL_CAPSULE | ORAL | 1 refills | Status: DC

## 2021-12-31 MED ORDER — DEXAMETHASONE 4 MG PO TABS: ORAL_TABLET | ORAL | 1 refills | Status: DC

## 2022-01-01 ENCOUNTER — Other Ambulatory Visit: Payer: Self-pay | Admitting: Internal Medicine

## 2022-01-01 ENCOUNTER — Encounter (HOSPITAL_COMMUNITY): Payer: Medicare HMO

## 2022-01-01 DIAGNOSIS — E782 Mixed hyperlipidemia: Secondary | ICD-10-CM

## 2022-01-01 LAB — CBC WITH DIFFERENTIAL/PLATELET
Absolute Monocytes: 757 cells/uL (ref 200–950)
Basophils Absolute: 87 cells/uL (ref 0–200)
Basophils Relative: 1 %
Eosinophils Absolute: 548 cells/uL — ABNORMAL HIGH (ref 15–500)
Eosinophils Relative: 6.3 %
HCT: 40.2 % (ref 38.5–50.0)
Hemoglobin: 13.6 g/dL (ref 13.2–17.1)
Lymphs Abs: 948 cells/uL (ref 850–3900)
MCH: 32.7 pg (ref 27.0–33.0)
MCHC: 33.8 g/dL (ref 32.0–36.0)
MCV: 96.6 fL (ref 80.0–100.0)
MPV: 9.2 fL (ref 7.5–12.5)
Monocytes Relative: 8.7 %
Neutro Abs: 6360 cells/uL (ref 1500–7800)
Neutrophils Relative %: 73.1 %
Platelets: 334 10*3/uL (ref 140–400)
RBC: 4.16 10*6/uL — ABNORMAL LOW (ref 4.20–5.80)
RDW: 13.1 % (ref 11.0–15.0)
Total Lymphocyte: 10.9 %
WBC: 8.7 10*3/uL (ref 3.8–10.8)

## 2022-01-01 LAB — COMPLETE METABOLIC PANEL WITH GFR
AG Ratio: 1.7 (calc) (ref 1.0–2.5)
ALT: 10 U/L (ref 9–46)
AST: 17 U/L (ref 10–35)
Albumin: 4 g/dL (ref 3.6–5.1)
Alkaline phosphatase (APISO): 71 U/L (ref 35–144)
BUN: 10 mg/dL (ref 7–25)
CO2: 31 mmol/L (ref 20–32)
Calcium: 10 mg/dL (ref 8.6–10.3)
Chloride: 101 mmol/L (ref 98–110)
Creat: 1.07 mg/dL (ref 0.70–1.22)
Globulin: 2.3 g/dL (calc) (ref 1.9–3.7)
Glucose, Bld: 88 mg/dL (ref 65–99)
Potassium: 4.6 mmol/L (ref 3.5–5.3)
Sodium: 140 mmol/L (ref 135–146)
Total Bilirubin: 0.4 mg/dL (ref 0.2–1.2)
Total Protein: 6.3 g/dL (ref 6.1–8.1)
eGFR: 69 mL/min/{1.73_m2} (ref >=60)

## 2022-01-01 LAB — INSULIN, RANDOM: Insulin: 6.9 u[IU]/mL

## 2022-01-01 LAB — VITAMIN D 25 HYDROXY (VIT D DEFICIENCY, FRACTURES): Vit D, 25-Hydroxy: 42 ng/mL (ref 30–100)

## 2022-01-01 LAB — LIPID PANEL
Cholesterol: 200 mg/dL — ABNORMAL HIGH (ref <200)
HDL: 46 mg/dL (ref >=40)
LDL Cholesterol (Calc): 122 mg/dL (calc) — ABNORMAL HIGH
Non-HDL Cholesterol (Calc): 154 mg/dL (calc) — ABNORMAL HIGH (ref <130)
Total CHOL/HDL Ratio: 4.3 (calc) (ref <5.0)
Triglycerides: 200 mg/dL — ABNORMAL HIGH (ref <150)

## 2022-01-01 LAB — HEMOGLOBIN A1C
Hgb A1c MFr Bld: 5.9 % of total Hgb — ABNORMAL HIGH (ref <5.7)
Mean Plasma Glucose: 123 mg/dL
eAG (mmol/L): 6.8 mmol/L

## 2022-01-01 LAB — TSH: TSH: 1.31 mIU/L (ref 0.40–4.50)

## 2022-01-01 LAB — MAGNESIUM: Magnesium: 1.8 mg/dL (ref 1.5–2.5)

## 2022-01-01 MED ORDER — EZETIMIBE 10 MG PO TABS: ORAL_TABLET | ORAL | 3 refills | Status: DC

## 2022-01-06 ENCOUNTER — Other Ambulatory Visit: Payer: Self-pay | Admitting: Internal Medicine

## 2022-01-09 ENCOUNTER — Telehealth: Payer: Self-pay | Admitting: Internal Medicine

## 2022-01-09 NOTE — Chronic Care Management (AMB) (Signed)
  Chronic Care Management   Outreach Note  01/09/2022 Name: Leslie Cardenas MRN: [id number] DOB: 24-Sep-1940  Referred by: Unk Pinto, MD Reason for referral : No chief complaint on file.   An unsuccessful telephone outreach was attempted today. The patient was referred to the pharmacist for assistance with care management and care coordination.   Follow Up Plan:   Tatjana Dellinger Upstream Scheduler

## 2022-01-11 NOTE — Progress Notes (Signed)
Synopsis: Referred for COPD by Unk Pinto, MD  Subjective:   PATIENT ID: Leslie Cardenas GENDER: male DOB: 30-Nov-1940, MRN: [id number]  Chief Complaint  Patient presents with   Follow-up    Pt c/o chest congestion and increased cough for about a wk. He is coughing up white sputum. Breathing has been worse and has had some wheezing.    80yM with history of COPD, CAD, diastolic dysfunction, Esophageal stenosis/stricture, smoking  He uses breztri 2 puffs twice daily and he's figured out that he needs to rinse his mouth out after using it due to hoarseness. He's only been on the inhaler for the last couple of weeks. Hoarseness is a little better now. He thinks it has been helpful for his breathing. Sometimes has cough productive of whitish phlegm in the morning.   He is on chronic prednisone which he thinks is mostly for itchy rash but may be for COPD. I see note from 2016 that he's taking it for dry skin.  He still gets intermittent dysphagia, last esophageal stricture dilation 07/26/20.   No family history of lung cancer, lung disease  Quit smoking 04/11/2000. 60 py smoker.   Interval HPI: Tried flonase/zyrtec for unilateral congestion. Dexa scan ordered last visit and pulm rehab. Recent AECOPD given doxy and dex taper. Taking both with some but limited improvement. Mostly bothered by his productive cough, chest congestion,. Does still have some component of sinus congestion, PND.   Otherwise pertinent review of systems is negative.   Past Medical History:  Diagnosis Date   Aortic atherosclerosis ([place]) 03/04/2018   Noted on CT Abd/pelvis   Cataract    Bilateral   Colon polyp    COPD (chronic obstructive pulmonary disease) ([place])    Coronary artery disease    Diverticulosis 07/16/2016   SIGMOID, NOTED ON COLONOSCOPY   Esophageal stenosis 02/24/2018   noted on endoscopy   Esophageal stricture    GERD (gastroesophageal reflux disease)    Grade I diastolic dysfunction  40/98/1191   noted on ECHO   History of hiatal hernia 02/24/2018   Small noted on endoscopy   Hydronephrosis    Hydronephrosis of right kidney 03/04/2018   URETERAL STONE 7 MM, NOTED ONJ CT ABD/PELVIS   Hyperlipidemia    Myocardial infarction [facility])    [id number]   Nephrolithiasis    s/p stent placement   Skin cancer    Traumatic amputation of right leg ([place])    Ureteral stone 03/04/2018   URETERAL STONE 7 MM. NOTED ON CT ABD/PELVIS   Vitamin D deficiency    Wears dentures    upper     Family History  Problem Relation Age of Onset   Hypertension Father    Pancreatic cancer Mother    Cancer Mother    Colon cancer Brother    Leukemia Brother    Prostate cancer Brother    Other Brother        polio   Esophageal cancer Neg Hx    Rectal cancer Neg Hx    Stomach cancer Neg Hx      Past Surgical History:  Procedure Laterality Date   CARDIAC CATHETERIZATION  1983   No intervention performed.    CATARACT EXTRACTION, BILATERAL     COLONOSCOPY     COLONOSCOPY W/ POLYPECTOMY  08/12/2010   Dr. Silvano Rusk   COLONOSCOPY W/ POLYPECTOMY  07/16/2016   CYSTOSCOPY W/ URETERAL STENT PLACEMENT Right 03/04/2018   Procedure: CYSTOSCOPY WITH RETROGRADE PYELOGRAM/URETERAL STENT PLACEMENT;  Surgeon:  [name], MD;  Location: [facility];  Service: Urology;  Laterality: Right;   CYSTOSCOPY/URETEROSCOPY/HOLMIUM LASER/STENT PLACEMENT Right 03/16/2018   Procedure: CYSTOSCOPY RIGHT URETEROSCOPY/HOLMIUM LASER/STENT PLACEMENT;  Surgeon: [name], MD;  Location: [facility];  Service: Urology;  Laterality: Right;   ESOPHAGOGASTRODUODENOSCOPY     INGUINAL HERNIA REPAIR     left   right leg amputation  05/14/1977   SHOULDER ARTHROSCOPY Left 2000   UPPER GASTROINTESTINAL ENDOSCOPY  WITH DILATION  02/24/2018    Social History   Socioeconomic History   Marital status: Divorced    Spouse name: Not on file   Number of children: 4   Years of education: Not on file   Highest  education level: 11th grade  Occupational History   Occupation: retired    Fish farm manager: RETIRED  Tobacco Use   Smoking status: Former    Packs/day: 3.00    Years: 20.00    Total pack years: 60.00    Types: Cigarettes    Quit date: 04/11/2000    Years since quitting: 21.7   Smokeless tobacco: Never  Vaping Use   Vaping Use: Never used  Substance and Sexual Activity   Alcohol use: Not Currently    Comment: rare   Drug use: No   Sexual activity: Not Currently  Other Topics Concern   Not on file  [employer] 4 yrs + 2 reserve   [employer]-65 then to [employer]   Has 4 children he is divorced   Former smoker rare alcohol no drugs   Lives in [place], [place].    Social Determinants of Health   Financial Resource Strain: Not on file  Food Insecurity: Not on file  Transportation Needs: Not on file  Physical Activity: Not on file  Stress: Not on file  Social Connections: Not on file  Intimate Partner Violence: Not on file     No Known Allergies   Outpatient Medications Prior to Visit  Medication Sig Dispense Refill   acetaminophen (TYLENOL) 325 MG tablet Take 325 mg by mouth every 6 (six) hours as needed.     albuterol (PROVENTIL) (2.5 MG/3ML) 0.083% nebulizer solution Inhale 1 vial by Nebulizer 4 x /day or every 4 hr to rescue Asthma 1350 mL 3   albuterol (VENTOLIN HFA) 108 (90 Base) MCG/ACT inhaler Inhale 2 puffs into the lungs every 4 (four) hours as needed for wheezing or shortness of breath. Make sure you rinse mouth out after each use. 18 g 2   cetirizine (ZYRTEC ALLERGY) 10 MG tablet Take 1 tablet (10 mg total) by mouth daily. 90 tablet 1   dexamethasone (DECADRON) 4 MG tablet Take 1 tab 3 x day - 3 days, then 2 x day - 3 days, then 1 tab daily 20 tablet 1   doxycycline (VIBRAMYCIN) 100 MG capsule Take 1 capsule 2 x /day with meals for Infection 30 capsule 1   ezetimibe (ZETIA) 10 MG tablet Take 1 tablet Daily for Cholesterol 90 tablet 3    fluticasone (FLONASE) 50 MCG/ACT nasal spray Place 1 spray into both nostrils daily. 16 g 3   Multiple Vitamins-Minerals (MULTIVITAMIN WITH MINERALS) tablet Take 1 tablet by mouth daily.     predniSONE (DELTASONE) 5 MG tablet TAKE 1 TABLET BY MOUTH TWICE DAILY AS DIRECTED 180 tablet 1   Respiratory Therapy Supplies (NEBULIZER/TUBING/MOUTHPIECE) KIT Disp one nebulizer machine, tubing set and mouthpiece kit for use 4 times daily  1 kit 0   rosuvastatin (CRESTOR) 20 MG tablet TAKE 1 TABLET BY MOUTH EVERY DAY FOR CHOLESTEROL 90 tablet 1   Spacer/Aero-Holding Chambers DEVI 2 puffs by Does not apply route 2 (two) times daily. Please use with MDI inhalers 1 each 0   tadalafil (CIALIS) 20 MG tablet Take 1/2 to 1 tablet every 2 to 3 days if needed for XXXX 30 tablet 1   VITAMIN D PO Take 5,000 Units by mouth daily.     No facility-administered medications prior to visit.       Objective:   Physical Exam:  General appearance: 81 y.o., male, NAD, conversant, thin Eyes: anicteric sclerae; PERRL, tracking appropriately HENT: NCAT; MMM. Deviated septum with narrow passage on left.  Neck: Trachea midline; no lymphadenopathy, no JVD Lungs: Diminished bilaterally, no crackles, no wheeze, with normal respiratory effort CV: RRR, no murmur  Abdomen: Soft, non-tender; non-distended, BS present  Extremities: No peripheral edema, warm Skin: Normal turgor and texture; no rash Psych: Appropriate affect Neuro: Alert and oriented to person and place, no focal deficit     Vitals:   01/13/22 0940  BP: 126/74  Pulse: 84  Temp: 97.7 F (36.5 C)  TempSrc: Oral  SpO2: 97%  Weight: 132 lb 12.8 oz (60.2 kg)  Height: _0  (1.727 m)     97% on RA BMI Readings from Last 3 Encounters:  01/13/22 20.19 kg/m  12/31/21 19.98 kg/m  11/18/21 20.35 kg/m   Wt Readings from Last 3 Encounters:  01/13/22 132 lb 12.8 oz (60.2 kg)  12/31/21 131 lb 6.4 oz (59.6 kg)  11/18/21 133 lb 13.1 oz (60.7 kg)      CBC    Component Value Date/Time   WBC 8.7 12/31/2021 1006   RBC 4.16 (L) 12/31/2021 1006   HGB 13.6 12/31/2021 1006   HCT 40.2 12/31/2021 1006   PLT 334 12/31/2021 1006   MCV 96.6 12/31/2021 1006   MCH 32.7 12/31/2021 1006   MCHC 33.8 12/31/2021 1006   RDW 13.1 12/31/2021 1006   LYMPHSABS 948 12/31/2021 1006   MONOABS 0.7 12/21/2020 1546   EOSABS 548 (H) 12/31/2021 1006   BASOSABS 87 12/31/2021 1006    Eos 500-600   Chest Imaging: LDCT CHest 2016 reviewed by me with diffuse bronchial wall thickening, emphysema  CXR 2020 reviewed by me clear lungs  CXR 01/13/22 reviewed by me with coarsened interstitium which could be suggestive of chronic bronchitic change, awaiting final read.  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  # Rhinitis # Deviated septum # History of sinus surgery  Plan: - guaifenesin 600 BID while having chest congestion and start flutter valve 10 slow but firm puffs twice daily after each breztri treatment - Breztri 2 puffs twice daily with spacer, reviewed in ideal use, rinse mouth afterward - finish steroid taper, doxy - if still doing poorly next visit then consider starting nucala for eosinophilic, steroid dependent asthma - pulmonary rehab referral placed last visit - bone density scan ordered last visit - flonase, zyrtec for rhinitis, if unimproved then refer to ENT  RTC 6 weeks  Hortencia Conradi  Verlee Monte, [facility] 01/13/2022 9:45 AM

## 2022-01-13 ENCOUNTER — Ambulatory Visit (INDEPENDENT_AMBULATORY_CARE_PROVIDER_SITE_OTHER): Payer: Medicare HMO

## 2022-01-13 ENCOUNTER — Ambulatory Visit: Payer: Medicare HMO | Admitting: Student

## 2022-01-13 ENCOUNTER — Encounter: Payer: Self-pay | Admitting: Student

## 2022-01-13 VITALS — BP 126/74 | HR 84 | Temp 97.7°F | Ht 68.0 in | Wt 132.8 lb

## 2022-01-13 DIAGNOSIS — J449 Chronic obstructive pulmonary disease, unspecified: Secondary | ICD-10-CM

## 2022-01-13 DIAGNOSIS — M47814 Spondylosis without myelopathy or radiculopathy, thoracic region: Secondary | ICD-10-CM | POA: Diagnosis not present

## 2022-01-13 DIAGNOSIS — R0989 Other specified symptoms and signs involving the circulatory and respiratory systems: Secondary | ICD-10-CM

## 2022-01-13 NOTE — Patient Instructions (Signed)
-   guaifenesin (mucinex) 600 mg twice daily when you have chest congestion like this - I'll be in touch after looking at x ray to let you know if we need another antibiotic - breztri 2 puffs twice daily with spacer - THEN flutter valve 10 slow but firm puffs twice daily until chest congestion clears up - flonase 1 spray each nostril after clearing nose out following shower, continue zyrtec as well for sinus congestion - see you in 6 weeks or sooner if need be!

## 2022-01-14 ENCOUNTER — Telehealth: Payer: Self-pay | Admitting: Internal Medicine

## 2022-01-14 NOTE — Progress Notes (Signed)
  Chronic Care Management   Note  01/14/2022 Name: Leslie Cardenas MRN: [id number] DOB: [birth year]  Leslie Cardenas is a 81 y.o. year old male who is a primary care patient of Unk Pinto, MD. I reached out to [place] by phone today in response to a referral sent by Leslie Cardenas's PCP, Unk Pinto, MD.   Leslie Cardenas was given information about Chronic Care Management services today including:  CCM service includes personalized support from designated clinical staff supervised by his physician, including individualized plan of care and coordination with other care providers 24/7 contact phone numbers for assistance for urgent and routine care needs. Service will only be billed when office clinical staff spend 20 minutes or more in a month to coordinate care. Only one practitioner may furnish and bill the service in a calendar month. The patient may stop CCM services at any time (effective at the end of the month) by phone call to the office staff.   Patient agreed to services and verbal consent obtained.   Follow up plan:   Tatjana Secretary/administrator

## 2022-02-10 ENCOUNTER — Ambulatory Visit: Payer: Medicare HMO | Admitting: Student

## 2022-02-10 NOTE — Progress Notes (Deleted)
Synopsis: Referred for COPD by Unk Pinto, MD  Subjective:   PATIENT ID: Leslie Cardenas GENDER: male DOB: Oct 26, 1940, MRN: [id number]  No chief complaint on file.  81yM with history of COPD, CAD, diastolic dysfunction, Esophageal stenosis/stricture, smoking  He uses breztri 2 puffs twice daily and he's figured out that he needs to rinse his mouth out after using it due to hoarseness. He's only been on the inhaler for the last couple of weeks. Hoarseness is a little better now. He thinks it has been helpful for his breathing. Sometimes has cough productive of whitish phlegm in the morning.   He is on chronic prednisone which he thinks is mostly for itchy rash but may be for COPD. I see note from 2016 that he's taking it for dry skin.  He still gets intermittent dysphagia, last esophageal stricture dilation 07/26/20.   No family history of lung cancer, lung disease  Quit smoking 04/11/2000. 60 py smoker.   Interval HPI: Seen after recent AECOPD at last visit, added fmucinex, flutter valve after breztri, flonase, zyrtec for rhinitis.  Otherwise pertinent review of systems is negative.   Past Medical History:  Diagnosis Date   Aortic atherosclerosis ([place]) 03/04/2018   Noted on CT Abd/pelvis   Cataract    Bilateral   Colon polyp    COPD (chronic obstructive pulmonary disease) ([place])    Coronary artery disease    Diverticulosis 07/16/2016   SIGMOID, NOTED ON COLONOSCOPY   Esophageal stenosis 02/24/2018   noted on endoscopy   Esophageal stricture    GERD (gastroesophageal reflux disease)    Grade I diastolic dysfunction 35/36/1443   noted on ECHO   History of hiatal hernia 02/24/2018   Small noted on endoscopy   Hydronephrosis    Hydronephrosis of right kidney 03/04/2018   URETERAL STONE 7 MM, NOTED ONJ CT ABD/PELVIS   Hyperlipidemia    Myocardial infarction [facility])    [id number]   Nephrolithiasis    s/p stent placement   Skin cancer    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 NOTED ON CT ABD/PELVIS   Vitamin D deficiency    Wears dentures    upper     Family History  Problem Relation Age of Onset   Hypertension Father    Pancreatic cancer Mother    Cancer Mother    Colon cancer Brother    Leukemia Brother    Prostate cancer Brother    Other Brother        polio   Esophageal cancer Neg Hx    Rectal cancer Neg Hx    Stomach cancer Neg Hx      Past Surgical History:  Procedure Laterality Date   CARDIAC CATHETERIZATION  1983   No intervention performed.    CATARACT EXTRACTION, BILATERAL     COLONOSCOPY     COLONOSCOPY W/ POLYPECTOMY  08/12/2010   Dr. Silvano Rusk   COLONOSCOPY W/ POLYPECTOMY  07/16/2016   CYSTOSCOPY W/ URETERAL STENT PLACEMENT Right 03/04/2018   Procedure: CYSTOSCOPY WITH RETROGRADE PYELOGRAM/URETERAL STENT PLACEMENT;  Surgeon: [name], MD;  Location: [facility];  Service: Urology;  Laterality: Right;   CYSTOSCOPY/URETEROSCOPY/HOLMIUM LASER/STENT PLACEMENT Right 03/16/2018   Procedure: CYSTOSCOPY RIGHT URETEROSCOPY/HOLMIUM LASER/STENT PLACEMENT;  Surgeon: [name], MD;  Location: [facility];  Service: Urology;  Laterality: Right;   ESOPHAGOGASTRODUODENOSCOPY     INGUINAL HERNIA REPAIR  left   right leg amputation  05/14/1977   SHOULDER ARTHROSCOPY Left 2000   UPPER GASTROINTESTINAL ENDOSCOPY  WITH DILATION  02/24/2018    Social History   Socioeconomic History   Marital status: Divorced    Spouse name: Not on file   Number of children: 4   Years of education: Not on file   Highest education level: 11th grade  Occupational History   Occupation: retired    Fish farm manager: RETIRED  Tobacco Use   Smoking status: Former    Packs/day: 3.00    Years: 20.00    Total pack years: 60.00    Types: Cigarettes    Quit date: 04/11/2000    Years since quitting: 21.8   Smokeless tobacco: Never  Vaping Use   Vaping Use: Never used  Substance and  Sexual Activity   Alcohol use: Not Currently    Comment: rare   Drug use: No   Sexual activity: Not Currently  Other Topics Concern   Not on file  [place] 4 yrs + [employer]   [employer] 61-65 then to [employer]   Has 4 children he is divorced   Former smoker rare alcohol no drugs   Lives in [place], [place].    Social Determinants of Health   Financial Resource Strain: Not on file  Food Insecurity: Not on file  Transportation Needs: Not on file  Physical Activity: Not on file  Stress: Not on file  Social Connections: Not on file  Intimate Partner Violence: Not on file     No Known Allergies   Outpatient Medications Prior to Visit  Medication Sig Dispense Refill   acetaminophen (TYLENOL) 325 MG tablet Take 325 mg by mouth every 6 (six) hours as needed.     albuterol (PROVENTIL) (2.5 MG/3ML) 0.083% nebulizer solution Inhale 1 vial by Nebulizer 4 x /day or every 4 hr to rescue Asthma 1350 mL 3   albuterol (VENTOLIN HFA) 108 (90 Base) MCG/ACT inhaler Inhale 2 puffs into the lungs every 4 (four) hours as needed for wheezing or shortness of breath. Make sure you rinse mouth out after each use. 18 g 2   cetirizine (ZYRTEC ALLERGY) 10 MG tablet Take 1 tablet (10 mg total) by mouth daily. 90 tablet 1   dexamethasone (DECADRON) 4 MG tablet Take 1 tab 3 x day - 3 days, then 2 x day - 3 days, then 1 tab daily 20 tablet 1   doxycycline (VIBRAMYCIN) 100 MG capsule Take 1 capsule 2 x /day with meals for Infection 30 capsule 1   ezetimibe (ZETIA) 10 MG tablet Take 1 tablet Daily for Cholesterol 90 tablet 3   fluticasone (FLONASE) 50 MCG/ACT nasal spray Place 1 spray into both nostrils daily. 16 g 3   Multiple Vitamins-Minerals (MULTIVITAMIN WITH MINERALS) tablet Take 1 tablet by mouth daily.     predniSONE (DELTASONE) 5 MG tablet TAKE 1 TABLET BY MOUTH TWICE DAILY AS DIRECTED 180 tablet 1   Respiratory Therapy Supplies (NEBULIZER/TUBING/MOUTHPIECE) KIT Disp one  nebulizer machine, tubing set and mouthpiece kit for use 4 times daily 1 kit 0   rosuvastatin (CRESTOR) 20 MG tablet TAKE 1 TABLET BY MOUTH EVERY DAY FOR CHOLESTEROL 90 tablet 1   Spacer/Aero-Holding Chambers DEVI 2 puffs by Does not apply route 2 (two) times daily. Please use with MDI inhalers 1 each 0   tadalafil (CIALIS) 20 MG tablet Take 1/2 to 1 tablet every 2 to 3 days  if needed for XXXX 30 tablet 1   VITAMIN D PO Take 5,000 Units by mouth daily.     No facility-administered medications prior to visit.       Objective:   Physical Exam:  General appearance: 81 y.o., male, NAD, conversant, thin Eyes: anicteric sclerae; PERRL, tracking appropriately HENT: NCAT; MMM. Deviated septum with narrow passage on left.  Neck: Trachea midline; no lymphadenopathy, no JVD Lungs: Diminished bilaterally, no crackles, no wheeze, with normal respiratory effort CV: RRR, no murmur  Abdomen: Soft, non-tender; non-distended, BS present  Extremities: No peripheral edema, warm Skin: Normal turgor and texture; no rash Psych: Appropriate affect Neuro: Alert and oriented to person and place, no focal deficit     There were no vitals filed for this visit.      on RA BMI Readings from Last 3 Encounters:  01/13/22 20.19 kg/m  12/31/21 19.98 kg/m  11/18/21 20.35 kg/m   Wt Readings from Last 3 Encounters:  01/13/22 132 lb 12.8 oz (60.2 kg)  12/31/21 131 lb 6.4 oz (59.6 kg)  11/18/21 133 lb 13.1 oz (60.7 kg)     CBC    Component Value Date/Time   WBC 8.7 12/31/2021 1006   RBC 4.16 (L) 12/31/2021 1006   HGB 13.6 12/31/2021 1006   HCT 40.2 12/31/2021 1006   PLT 334 12/31/2021 1006   MCV 96.6 12/31/2021 1006   MCH 32.7 12/31/2021 1006   MCHC 33.8 12/31/2021 1006   RDW 13.1 12/31/2021 1006   LYMPHSABS 948 12/31/2021 1006   MONOABS 0.7 12/21/2020 1546   EOSABS 548 (H) 12/31/2021 1006   BASOSABS 87 12/31/2021 1006    Eos 500-600   Chest Imaging: LDCT CHest 2016 reviewed by me  with diffuse bronchial wall thickening, emphysema  CXR 2020 reviewed by me clear lungs  CXR 01/13/22 reviewed by me with coarsened interstitium which could be suggestive of chronic bronchitic change  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  # Rhinitis # Deviated septum # History of sinus surgery  Plan: - guaifenesin 600 BID while having chest congestion and start flutter valve 10 slow but firm puffs twice daily after each breztri treatment - Breztri 2 puffs twice daily with spacer, reviewed in ideal use, rinse mouth afterward - finish steroid taper, doxy - if still doing poorly next visit then consider starting nucala for eosinophilic, steroid dependent asthma - pulmonary rehab referral placed last visit - bone density scan ordered last visit - flonase, zyrtec for rhinitis, if unimproved then refer to ENT  RTC 6 weeks  Maryjane Hurter, MD [facility] 02/10/2022 11:14 AM

## 2022-02-11 ENCOUNTER — Telehealth: Payer: Self-pay

## 2022-02-11 NOTE — Telephone Encounter (Signed)
LM-02/11/22-Chart Prep started, Reviewing OV, Consults, Hospital visits, Labs and medication changes. Chart prep completed.   Total time spent: 48 min.

## 2022-02-12 NOTE — Telephone Encounter (Signed)
LM-02/12/22-Care plan complete for CP visit on 9/12.  Total time spent: 14 min.

## 2022-02-12 NOTE — Telephone Encounter (Signed)
LM-02/12/22-Called pt. And confirmed CP initial visit for 9/12 at [facility] questions completed.  Total time spent: 5 min.

## 2022-02-17 ENCOUNTER — Ambulatory Visit: Payer: Medicare HMO | Admitting: Pharmacy Technician

## 2022-02-17 DIAGNOSIS — N1831 Chronic kidney disease, stage 3a: Secondary | ICD-10-CM

## 2022-02-17 DIAGNOSIS — J449 Chronic obstructive pulmonary disease, unspecified: Secondary | ICD-10-CM

## 2022-02-17 DIAGNOSIS — I10 Essential (primary) hypertension: Secondary | ICD-10-CM

## 2022-02-17 DIAGNOSIS — E782 Mixed hyperlipidemia: Secondary | ICD-10-CM

## 2022-02-17 DIAGNOSIS — Z79899 Other long term (current) drug therapy: Secondary | ICD-10-CM

## 2022-02-17 NOTE — Telephone Encounter (Signed)
LM-02/17/22-Updated pts. Chart prep for CP visit today and completed Medicare cost analysis.  Total time spent: 15 min.

## 2022-02-17 NOTE — Telephone Encounter (Signed)
LM-02/17/22-Called [facility] and r/s pt. Appointment to 9/14 at 2:15PM. Called [facility] to r/s pt. And was informed a referral from pts. Pulmonologist is required to schedule. Called pt. And informed him I scheduled his pulmonology visit for 9/14 at 2:15PM. Pt. Verbalized understanding and agreed. Advised pt. To request a new referral for pulmonary rehab during his visit with Dr. Verlee Monte per pulmonary rehab clinical nurse navigator [place]. Pt. Verbalized understanding and agreed. Set task to call patient in two weeks to ensure he attends pulmonary visit and got an visit with pulmonary rehab.  Total time spent: 23 min.

## 2022-02-17 NOTE — Progress Notes (Deleted)
Synopsis: Referred for COPD by Unk Pinto, MD  Subjective:   PATIENT ID: Alger Memos GENDER: male DOB: Oct 26, 1940, MRN: [id number]  No chief complaint on file.  81yM with history of COPD, CAD, diastolic dysfunction, Esophageal stenosis/stricture, smoking  He uses breztri 2 puffs twice daily and he's figured out that he needs to rinse his mouth out after using it due to hoarseness. He's only been on the inhaler for the last couple of weeks. Hoarseness is a little better now. He thinks it has been helpful for his breathing. Sometimes has cough productive of whitish phlegm in the morning.   He is on chronic prednisone which he thinks is mostly for itchy rash but may be for COPD. I see note from 2016 that he's taking it for dry skin.  He still gets intermittent dysphagia, last esophageal stricture dilation 07/26/20.   No family history of lung cancer, lung disease  Quit smoking 04/11/2000. 60 py smoker.   Interval HPI: Seen after recent AECOPD at last visit, added fmucinex, flutter valve after breztri, flonase, zyrtec for rhinitis.  Otherwise pertinent review of systems is negative.   Past Medical History:  Diagnosis Date   Aortic atherosclerosis ([place]) 03/04/2018   Noted on CT Abd/pelvis   Cataract    Bilateral   Colon polyp    COPD (chronic obstructive pulmonary disease) ([place])    Coronary artery disease    Diverticulosis 07/16/2016   SIGMOID, NOTED ON COLONOSCOPY   Esophageal stenosis 02/24/2018   noted on endoscopy   Esophageal stricture    GERD (gastroesophageal reflux disease)    Grade I diastolic dysfunction 35/36/1443   noted on ECHO   History of hiatal hernia 02/24/2018   Small noted on endoscopy   Hydronephrosis    Hydronephrosis of right kidney 03/04/2018   URETERAL STONE 7 MM, NOTED ONJ CT ABD/PELVIS   Hyperlipidemia    Myocardial infarction [facility])    [id number]   Nephrolithiasis    s/p stent placement   Skin cancer    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 NOTED ON CT ABD/PELVIS   Vitamin D deficiency    Wears dentures    upper     Family History  Problem Relation Age of Onset   Hypertension Father    Pancreatic cancer Mother    Cancer Mother    Colon cancer Brother    Leukemia Brother    Prostate cancer Brother    Other Brother        polio   Esophageal cancer Neg Hx    Rectal cancer Neg Hx    Stomach cancer Neg Hx      Past Surgical History:  Procedure Laterality Date   CARDIAC CATHETERIZATION  1983   No intervention performed.    CATARACT EXTRACTION, BILATERAL     COLONOSCOPY     COLONOSCOPY W/ POLYPECTOMY  08/12/2010   Dr. Silvano Rusk   COLONOSCOPY W/ POLYPECTOMY  07/16/2016   CYSTOSCOPY W/ URETERAL STENT PLACEMENT Right 03/04/2018   Procedure: CYSTOSCOPY WITH RETROGRADE PYELOGRAM/URETERAL STENT PLACEMENT;  Surgeon: [name], MD;  Location: [facility];  Service: Urology;  Laterality: Right;   CYSTOSCOPY/URETEROSCOPY/HOLMIUM LASER/STENT PLACEMENT Right 03/16/2018   Procedure: CYSTOSCOPY RIGHT URETEROSCOPY/HOLMIUM LASER/STENT PLACEMENT;  Surgeon: [name], MD;  Location: [facility];  Service: Urology;  Laterality: Right;   ESOPHAGOGASTRODUODENOSCOPY     INGUINAL HERNIA REPAIR  left   right leg amputation  05/14/1977   SHOULDER ARTHROSCOPY Left 2000   UPPER GASTROINTESTINAL ENDOSCOPY  WITH DILATION  02/24/2018    Social History   Socioeconomic History   Marital status: Divorced    Spouse name: Not on file   Number of children: 4   Years of education: Not on file   Highest education level: 11th grade  Occupational History   Occupation: retired    Fish farm manager: RETIRED  Tobacco Use   Smoking status: Former    Packs/day: 3.00    Years: 20.00    Total pack years: 60.00    Types: Cigarettes    Quit date: 04/11/2000    Years since quitting: 21.8   Smokeless tobacco: Never  Vaping Use   Vaping Use: Never used  Substance and  Sexual Activity   Alcohol use: Not Currently    Comment: rare   Drug use: No   Sexual activity: Not Currently  Other Topics Concern   Not on file  [place] 4 yrs + [employer]   [employer] 61-65 then to [employer]   Has 4 children he is divorced   Former smoker rare alcohol no drugs   Lives in [place], [place].    Social Determinants of Health   Financial Resource Strain: Not on file  Food Insecurity: Not on file  Transportation Needs: Not on file  Physical Activity: Not on file  Stress: Not on file  Social Connections: Not on file  Intimate Partner Violence: Not on file     No Known Allergies   Outpatient Medications Prior to Visit  Medication Sig Dispense Refill   acetaminophen (TYLENOL) 325 MG tablet Take 325 mg by mouth every 6 (six) hours as needed.     albuterol (PROVENTIL) (2.5 MG/3ML) 0.083% nebulizer solution Inhale 1 vial by Nebulizer 4 x /day or every 4 hr to rescue Asthma 1350 mL 3   albuterol (VENTOLIN HFA) 108 (90 Base) MCG/ACT inhaler Inhale 2 puffs into the lungs every 4 (four) hours as needed for wheezing or shortness of breath. Make sure you rinse mouth out after each use. 18 g 2   Budeson-Glycopyrrol-Formoterol (BREZTRI AEROSPHERE) 160-9-4.8 MCG/ACT AERO Inhale into the lungs.     cetirizine (ZYRTEC ALLERGY) 10 MG tablet Take 1 tablet (10 mg total) by mouth daily. 90 tablet 1   ezetimibe (ZETIA) 10 MG tablet Take 1 tablet Daily for Cholesterol 90 tablet 3   loratadine (CLARITIN) 10 MG tablet Take 10 mg by mouth daily.     predniSONE (DELTASONE) 5 MG tablet TAKE 1 TABLET BY MOUTH TWICE DAILY AS DIRECTED 180 tablet 1   Respiratory Therapy Supplies (NEBULIZER/TUBING/MOUTHPIECE) KIT Disp one nebulizer machine, tubing set and mouthpiece kit for use 4 times daily 1 kit 0   rosuvastatin (CRESTOR) 20 MG tablet TAKE 1 TABLET BY MOUTH EVERY DAY FOR CHOLESTEROL (Patient not taking: Reported on 02/17/2022) 90 tablet 1   Spacer/Aero-Holding  Chambers DEVI 2 puffs by Does not apply route 2 (two) times daily. Please use with MDI inhalers 1 each 0   VITAMIN D PO Take 5,000 Units by mouth daily.     No facility-administered medications prior to visit.       Objective:   Physical Exam:  General appearance: 81 y.o., male, NAD, conversant, thin Eyes: anicteric sclerae; PERRL, tracking appropriately HENT: NCAT; MMM. Deviated septum with narrow passage on left.  Neck: Trachea midline; no lymphadenopathy, no JVD Lungs:  Diminished bilaterally, no crackles, no wheeze, with normal respiratory effort CV: RRR, no murmur  Abdomen: Soft, non-tender; non-distended, BS present  Extremities: No peripheral edema, warm Skin: Normal turgor and texture; no rash Psych: Appropriate affect Neuro: Alert and oriented to person and place, no focal deficit     There were no vitals filed for this visit.      on RA BMI Readings from Last 3 Encounters:  01/13/22 20.19 kg/m  12/31/21 19.98 kg/m  11/18/21 20.35 kg/m   Wt Readings from Last 3 Encounters:  01/13/22 132 lb 12.8 oz (60.2 kg)  12/31/21 131 lb 6.4 oz (59.6 kg)  11/18/21 133 lb 13.1 oz (60.7 kg)     CBC    Component Value Date/Time   WBC 8.7 12/31/2021 1006   RBC 4.16 (L) 12/31/2021 1006   HGB 13.6 12/31/2021 1006   HCT 40.2 12/31/2021 1006   PLT 334 12/31/2021 1006   MCV 96.6 12/31/2021 1006   MCH 32.7 12/31/2021 1006   MCHC 33.8 12/31/2021 1006   RDW 13.1 12/31/2021 1006   LYMPHSABS 948 12/31/2021 1006   MONOABS 0.7 12/21/2020 1546   EOSABS 548 (H) 12/31/2021 1006   BASOSABS 87 12/31/2021 1006    Eos 500-600   Chest Imaging: LDCT CHest 2016 reviewed by me with diffuse bronchial wall thickening, emphysema  CXR 2020 reviewed by me clear lungs  CXR 01/13/22 reviewed by me with coarsened interstitium which could be suggestive of chronic bronchitic change  Pulmonary Functions Testing Results:     No data to display          PFT 2014 reviewed by me with  severe obstruction, moderately reduced dlco, ++BD response  ONO 06/11/21 saturation low was 88%   Echocardiogram:   TTE 2022 with mildly elevated RVSP      Assessment & Plan:   # 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  # Rhinitis # Deviated septum # History of sinus surgery  Plan: - guaifenesin 600 BID while having chest congestion and start flutter valve 10 slow but firm puffs twice daily after each breztri treatment - Breztri 2 puffs twice daily with spacer, reviewed in ideal use, rinse mouth afterward - finish steroid taper, doxy - if still doing poorly next visit then consider starting nucala for eosinophilic, steroid dependent asthma - pulmonary rehab referral placed last visit - bone density scan ordered last visit - flonase, zyrtec for rhinitis, if unimproved then refer to ENT  RTC 6 weeks  Maryjane Hurter, MD [facility] 02/17/2022 6:16 PM

## 2022-02-17 NOTE — Telephone Encounter (Signed)
LM-02/17/22-CP initial visit notes downloaded, edited, and uploaded to pts. [email or web address] to CP for review.  Total time spent: 4 min.

## 2022-02-19 ENCOUNTER — Ambulatory Visit: Payer: Medicare HMO | Admitting: Student

## 2022-02-20 NOTE — Progress Notes (Signed)
Initial Pharmacist Visit (CCM)  Leslie Cardenas,Leslie Cardenas  76 years, Male  DOB: 1940/08/17  M: [phone]  __________________________________________________  Patient scheduled for CCM visit with the clinical pharmacist.  Patient is referred for CCM by their PCP and Clinical Pharmacist is under general PCP supervision.: At least 2 of these conditions are expected to last 12 months or longer and patient is at significant risk for acute exacerbations and/or functional decline.  Patient has consented to participation in [place] program. Visit Type: Clinic visit Date of Upcoming Visit: 02/17/2022 Patient Chart Prep [facility])  Patient's Chronic Conditions: Hypertension (HTN), Gastroesophageal Reflux Disease (GERD), Chronic Obstructive Pulmonary Disease (COPD), Chronic Kidney Disease (CKD), Hyperlipidemia/Dyslipidemia (HLD), Diabetes (DM), Sinus bradycardia, TIA, Vitamin D deficiency  Summary for PCP:  1. LDL above goal of 70 and not on treatment. PCP please consider statin therapy with hx of TIA and MI. Counseled on dietary modifications. 2. Follow up on Pulmonary Rehab 3. Patient on chronic steroids and has not had DEXA scan.  Doctor and Hospital Visits Were there PCP Visits in last 6 months?: Yes PCP Visits details:  -09/30/21-Tonya Cranford, NP- Pt. presented for f/u. BP 148/82 HR 83. No medication changes noted. -12/31/21-DrMelford Aase- Pt. presented for f/u. BP 140/76 HR 49. STARTED Dexamethasone 4 MG Take 1 tab 3 x day - 3 days, then 2 x day - 3 days, then 1 tab daily Doxycycline Hyclate 100 MG Take 1 capsule 2 x /day with meals for Infection STOPPED predniSONE 2.5 MG Take 3 tablets daily for 2 weeks, then 2 tablets daily until you see [place] again Sulfamethoxazole-Trimethoprim 800-160 MG Take 1 tablet 2 x /day with Meals for 3 weeks for Prostate  Infection  Were there Specialist Visits in last 6 months?: Yes Specialist Visits details: 01/13/22-Meier, Hortencia Conradi, MD (Pulmonary Disease)-Pt. presented for f/u  chest congestion. BP 126/74 HR 84. No medication changes noted. 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 STARTED guaifenesin 600 BID  start flutter valve 10 slow but firm puffs twice daily after each Breztri treatment Breztri 2 puffs twice daily with spacer flonase 1 spray each nostril after clearing nose out following shower  Was there a Hospital Visit in last 30 days?: No Were there other Hospital Visits in last 6 months?: No  Subjective Information Visit Completed on: 02/17/2022 Did patient bring medications to appointment?: Yes Subjective: Very sweet gentleman presenting in person for CCM initial visit. He brings in only 3 medications today (Zetia, Loratadine, and prednisone). He stays busy doing handyman work. He eats out alot and does not follow any specific dietary modifications. He states he is staying hydrated and drinks plenty of fluids throughout the day. He has 4 children and is divorced. Lifestyle habits: Diet: No formal diet Exercise: No formal exercise, but does stay active with yardwork, housework, and handyman work Tobacco: Former. Quit in 2001 Alcohol: None Caffeine: 3-4 cuos of coffee daily Recreational drugs: None What is the patient's sleep pattern?: No sleep issues How many hours per night does patient typically sleep?: 7+  SDOH: Accountable Health Communities Health-Related Social Needs Screening Tool (BloggerBowl.es) SDOH questions were documented and reviewed (EMR or [employer]) within the past 12 months or since hospitalization?: No What is your living situation today? (ref #1): I have a steady place to live Think about the place you live. Do you have problems with any of the following? (ref #2): None of the above Within the past 12 months, you worried that your food  would run out before you got money to buy more (ref #3): Never true Within  the past 12 months, the food you bought just didn't last and you didn't have money to get more (ref #4): Never true In the past 12 months, has lack of reliable transportation kept you from medical appointments, meetings, work or from getting things needed for daily living? (ref #5): No In the past 12 months, has the electric, gas, oil, or water company threatened to shut off services in your home? (ref #6): No How often does anyone, including family and friends, physically hurt you? (ref #7): Never (1) How often does anyone, including family and friends, insult or talk down to you? (ref #8): Never (1) How often does anyone, including friends and family, threaten you with harm? (ref #9): Never (1) How often does anyone, including family and friends, scream or curse at you? (ref #10): Never (1)  Medication Adherence Does the [facility] have access to medication refill history?: Yes Medication adherence rates for STAR metric medications:  Ezetimibe '10mg'$ - 12/04/20 (90DS), 01/01/22 (90DS) Rosuvastatin '20mg'$ - 04/17/20 (90DS), 04/01/21 (no DS listed)  Medication adherence rates for non-STAR metric medications:  Prednisone '5mg'$ - 08/29/21 (90DS), 01/06/22 (90DS)  Factors that may affect medication adherence?: Lack of understanding / insight of condition, Perceived lack of benefit of therapy Name and location of [facility] Current Rx insurance plan: [employer] Are meds synced by current pharmacy?: No Are meds delivered by current pharmacy?: No - delivery not available Would patient benefit from direct intervention of clinical lead in dispensing process to optimize clinical outcomes?: No Are [facility] pharmacy services available where patient lives?: Yes Is patient disadvantaged to use [facility]?: Yes Does patient experience delays in picking up medications due to transportation concerns (getting to pharmacy)?: No Medication organization: Bottles We discussed: Use of adherence aids (pillbox, alarms),  Taking medications at the same time each day Assessment:: Potentially Non-adherent  Hypertension (HTN) Most Recent BP: 126/74 Most Recent HR: 84 taken on: 01/13/2022 Care Gap: Need BP documented or last BP 140/90 or higher: Addressed Assessed today?: Yes BP today is: 122/72 Heart Rate is: 80 Goal: <130/80 mmHG Is Patient checking BP at home?: No Has patient experienced hypotension, dizziness, falls or bradycardia?: No We discussed: DASH diet:  following a diet emphasizing fruits and vegetables and low-fat dairy products along with whole grains, fish, poultry, and nuts. Reducing red meats and sugars., Reducing the amount of salt intake to '1500mg'$ /per day., Proper Home BP Measurement, Hypertension pathophysiology, complications, treatment goals, and management with patient for 10-15 minutes, Contacting PCP office for signs and symptoms of high or low blood pressure (hypotension, dizziness, falls, headaches, edema) Assessment:: Controlled Drug: None  Hyperlipidemia/Dyslipidemia (HLD) Last Lipid panel on: 12/31/2021 TC (Goal<200): 200 LDL: 122 HDL (Goal>40): 46 TG (Goal<150): 200 ASCVD 10-year risk?is:: N/A due to Age > 108 Assessed today?: Yes LDL Goal: <70 We discussed: Complications of hyperlipidemia and prevention methods with patient for 5-10 minutes, How a diet high in fruits/vegetables/nuts/whole grains/beans may help to reduce your cholesterol. Increasing soluble fiber intake.  Avoiding sugary foods and trans fat, limiting carbohydrates, and reducing portion sizes. Recommended increasing intake of healthy fats into their diet Assessment:: Uncontrolled Drug: Ezetimibe '10mg'$ - 1 tab QD Pharmacist Assessment: Appropriate, Query Effectiveness Plan/Follow up: PCP consider initiating a statin. Counseled on dietary modifications.  Diabetes (DM) Most recent A1C: 5.9 taken on: 12/31/2021 Previous A1C: 5.9 taken on: 06/24/2021 Most Recent GFR: 69 taken on: 12/31/2021 Type:  Pre-Diabetes/Impaired Fasting Glucose  Assessed today?: No Drug: None  Chronic Obstructive Pulmonary Disease (COPD) Most recent FEV1/FVC: Unknown Most recent FEV1: Unknown Most recent Eosinophils: 548 taken on: 01/13/2022 Assessed today?: Yes Gold group: B (high sx, < 2 exacerbations / yr) Exacerbations in past year with hospitalization: No Is patient currently Smoking or Vaping?: No Did patient smoke/vape before?: Yes Home oxygen therapy: No Frequency of SABA/SAMA use: Multiple times per day Completing CAT Assessment today?: Yes How often are you coughing?: 3 How much mucus are you feeling in your chest?: 2 How much chest tightness are you having?: 2 How is your breath when you walk up a hill or flight of stairs?: 3 How are your limitations to doing activities at home?: 2 How would you rate your confidence in leaving your home due to your lung condition?: 0 (I am confident leaving my home despite my lung condition) How are you sleeping?: 1 How is your energy level?: 1 CAT Score: 15 We discussed: Inhaler technique, Plan for acute exacerbations, Proper use of a spacer with prescribed HFA inhaler, COPD pathophysiology, treatment goals, management, identifying signs of acute exacerbations and exacerbation prevention methods with patient for 10-15 minutes., Rinsing mouth after using inhaled corticosteroid Assessment:: Uncontrolled Drug: Albuterol- 2 puffs every 4 hours PRN Pharmacist Assessment: Appropriate, Effective, Safe, Accessible Drug: Breztri-2 puffs daily Pharmacist Assessment: Appropriate, Effective, Safe, Accessible Plan/Follow up: Follow up on referral for Pulmonary Rehab  GERD Assessed today?: No Drug: None  Chronic kidney disease (CKD) Most Recent GFR: 69 taken on: 12/31/2021 Previous GFR: 56 taken on: 09/30/2021 Most recent microalbumin ratio: 22 tested on: 06/24/2021 Assessed today?: Yes CKD Stage: Stage 2 (GFR 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 ml/min) Albuminuria Stage: A1  (<30) Contributing factors for developing CKD: HTN Is Patient taking statin medication: No Is patient taking ACEi / ARB?: No Renal dose adjustments recommended?: No We discussed: Limiting dietary sodium intake to less than 2000 mg / day, Maintaining blood pressure control, Maintaining blood glucose control, Avoidance of nephrotoxic drugs (NSAIDs) Assessment:: Controlled Drug: None  Preventative Health Care Gap: Colorectal cancer screening: Patient excluded from population (Age > 26, hx of colorectal cancer or colectomy, hospice services) Care Gap: Annual Wellness Visit (AWV): Addressed Immunizations needed: Zoster Additional diet counseling points. We discussed: key components of the DASH diet, aiming to consume at least 8 cups of water day  Intervention Details Pharmacist Interventions discussed: Yes Started Therapy: Untreated medical condition, Preventative therapy Education: Lifestyle modifications  Next CCM Follow Up: 6 months Next AWV: 04/13/22 Next PCP Visit: 04/13/22  CPP Prep: 47mn CPP OV: 327m CPP Doc: 2043mCPP Care Plan: 6mi79mAttestation Statement:: CCM Services:  This encounter meets complex CCM services and moderate to high medical decision making.  Prior to outreach and patient consent for Chronic Care Management, I referred this patient for services after reviewing the nominated patient list or from a personal encounter with the patient.  I have personally reviewed this encounter including the documentation in this note and have collaborated with the care management provider regarding care management and care coordination activities to include development and update of the comprehensive care plan I am certifying that I agree with the content of this note and encounter as supervising physician.  AverMarda StalkerarmD Clinical Pharmacist AverNaida [email or web address] [phone]

## 2022-03-02 NOTE — Telephone Encounter (Signed)
LM-03/02/22-Called pt. To inquire why he missed his appointment with pulmonology. Pt. Informed me he forgot and requested I r/s visit. Informed pt. I would call and r/s. Called [facility] and rescheduled pt. To see Dr. Dallas Schimke on 10/12 at 10:15AM. Called pt. And had him write down appointment date/time/address and provided pt. [facility] phone # at [phone]. Advised pt. To call if he has any questions or concerns. PT. Verbalized understanding and agreed. Rescheduled task for 10/11 to ensure pt. Does not miss appointment and for a reminder.   Total time spent: 17 min.

## 2022-03-03 DIAGNOSIS — H02105 Unspecified ectropion of left lower eyelid: Secondary | ICD-10-CM | POA: Diagnosis not present

## 2022-03-03 DIAGNOSIS — Z961 Presence of intraocular lens: Secondary | ICD-10-CM | POA: Diagnosis not present

## 2022-03-05 ENCOUNTER — Telehealth: Payer: Self-pay

## 2022-03-05 NOTE — Telephone Encounter (Signed)
LM-03/05/22-Chart review started. Reviewing OV, Consults, Hospital visits, Labs and medication changes.  Chart review completed for COPD review call. Will call pt. Near due date of task (03/19/22).  Total time spent: 8 min.

## 2022-03-07 DIAGNOSIS — E782 Mixed hyperlipidemia: Secondary | ICD-10-CM

## 2022-03-07 DIAGNOSIS — I10 Essential (primary) hypertension: Secondary | ICD-10-CM

## 2022-03-07 DIAGNOSIS — N1831 Chronic kidney disease, stage 3a: Secondary | ICD-10-CM

## 2022-03-07 DIAGNOSIS — J449 Chronic obstructive pulmonary disease, unspecified: Secondary | ICD-10-CM

## 2022-03-11 ENCOUNTER — Encounter: Payer: Self-pay | Admitting: Internal Medicine

## 2022-03-16 ENCOUNTER — Telehealth: Payer: Self-pay

## 2022-03-16 NOTE — Telephone Encounter (Signed)
LM-03/16/22-Called pt. To remind him of his pulmonologist visit scheduled for 10/12 at 10:15AM and asked pt. To arrive 15 minutes early for visit. Pt. Verbalized understanding and agreed. Task reassigned to Thursday 10/12 to ensure pt. Attended visit.   Total time spent: 4 min.

## 2022-03-17 ENCOUNTER — Encounter: Payer: Self-pay | Admitting: Internal Medicine

## 2022-03-17 NOTE — Telephone Encounter (Signed)
LM-03/17/22-Chart review started for general review call. Reviewing OV, Consults, Hospital visits, Labs and medication changes.

## 2022-03-17 NOTE — Progress Notes (Signed)
Synopsis: Referred for COPD by Unk Pinto, MD  Subjective:   PATIENT ID: Leslie Cardenas GENDER: male DOB: 1940-08-20, MRN: [id number]  Chief Complaint  Patient presents with   Follow-up   81yM with history of COPD, CAD, diastolic dysfunction, Esophageal stenosis/stricture, smoking  He uses breztri 2 puffs twice daily and he's figured out that he needs to rinse his mouth out after using it due to hoarseness. He's only been on the inhaler for the last couple of weeks. Hoarseness is a little better now. He thinks it has been helpful for his breathing. Sometimes has cough productive of whitish phlegm in the morning.   He is on chronic prednisone which he thinks is mostly for itchy rash but may be for COPD. I see note from 2016 that he's taking it for dry skin.  He still gets intermittent dysphagia, last esophageal stricture dilation 07/26/20.   No family history of lung cancer, lung disease  Quit smoking 04/11/2000. 60 py smoker.   Interval HPI: Seen after recent AECOPD at last visit, added mucinex, flutter valve after breztri, flonase, zyrtec for rhinitis.  Has stayed off steroids since last visit.   Otherwise pertinent review of systems is negative.   Past Medical History:  Diagnosis Date   Aortic atherosclerosis ([place]) 03/04/2018   Noted on CT Abd/pelvis   Cataract    Bilateral   Colon polyp    COPD (chronic obstructive pulmonary disease) ([place])    Coronary artery disease    Diverticulosis 07/16/2016   SIGMOID, NOTED ON COLONOSCOPY   Esophageal stenosis 02/24/2018   noted on endoscopy   Esophageal stricture    GERD (gastroesophageal reflux disease)    Grade I diastolic dysfunction 38/17/7116   noted on ECHO   History of hiatal hernia 02/24/2018   Small noted on endoscopy   Hydronephrosis    Hydronephrosis of right kidney 03/04/2018   URETERAL STONE 7 MM, NOTED ONJ CT ABD/PELVIS   Hyperlipidemia    Myocardial infarction [facility])    [id number]   Nephrolithiasis     s/p stent placement   Skin cancer    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 NOTED ON CT ABD/PELVIS   Vitamin D deficiency    Wears dentures    upper     Family History  Problem Relation Age of Onset   Hypertension Father    Pancreatic cancer Mother    Cancer Mother    Colon cancer Brother    Leukemia Brother    Prostate cancer Brother    Other Brother        polio   Esophageal cancer Neg Hx    Rectal cancer Neg Hx    Stomach cancer Neg Hx      Past Surgical History:  Procedure Laterality Date   CARDIAC CATHETERIZATION  1983   No intervention performed.    CATARACT EXTRACTION, BILATERAL     COLONOSCOPY     COLONOSCOPY W/ POLYPECTOMY  08/12/2010   Dr. Silvano Rusk   COLONOSCOPY W/ POLYPECTOMY  07/16/2016   CYSTOSCOPY W/ URETERAL STENT PLACEMENT Right 03/04/2018   Procedure: CYSTOSCOPY WITH RETROGRADE PYELOGRAM/URETERAL STENT PLACEMENT;  Surgeon: [name], MD;  Location: [place];  Service: Urology;  Laterality: Right;   CYSTOSCOPY/URETEROSCOPY/HOLMIUM LASER/STENT PLACEMENT Right 03/16/2018   Procedure: CYSTOSCOPY RIGHT URETEROSCOPY/HOLMIUM LASER/STENT PLACEMENT;  Surgeon: [name], MD;  Location[facility];  Service: Urology;  Laterality: Right;  ESOPHAGOGASTRODUODENOSCOPY     INGUINAL HERNIA REPAIR     left   right leg amputation  05/14/1977   SHOULDER ARTHROSCOPY Left 2000   UPPER GASTROINTESTINAL ENDOSCOPY  WITH DILATION  02/24/2018    Social History   Socioeconomic History   Marital status: Divorced    Spouse name: Not on file   Number of children: 4   Years of education: Not on file   Highest education level: 11th grade  Occupational History   Occupation: retired    Fish farm manager: RETIRED  Tobacco Use   Smoking status: Former    Packs/day: 3.00    Years: 20.00    Total pack years: 60.00    Types: Cigarettes    Quit date: 04/11/2000    Years since quitting: 21.9   Smokeless  tobacco: Never  Vaping Use   Vaping Use: Never used  Substance and Sexual Activity   Alcohol use: Not Currently    Comment: rare   Drug use: No   Sexual activity: Not Currently  Other Topics Concern   Not on file  [place] 4 yrs + 2 reserve   [employer] 62-65 then to [employer]   Has 4 children he is divorced   Former smoker rare alcohol no drugs   Lives in [place], [place].    Social Determinants of Health   Financial Resource Strain: Not on file  Food Insecurity: Not on file  Transportation Needs: Not on file  Physical Activity: Not on file  Stress: Not on file  Social Connections: Not on file  Intimate Partner Violence: Not on file     No Known Allergies   Outpatient Medications Prior to Visit  Medication Sig Dispense Refill   albuterol (PROVENTIL) (2.5 MG/3ML) 0.083% nebulizer solution Inhale 1 vial by Nebulizer 4 x /day or every 4 hr to rescue Asthma 1350 mL 3   albuterol (VENTOLIN HFA) 108 (90 Base) MCG/ACT inhaler Inhale 2 puffs into the lungs every 4 (four) hours as needed for wheezing or shortness of breath. Make sure you rinse mouth out after each use. 18 g 2   Budeson-Glycopyrrol-Formoterol (BREZTRI AEROSPHERE) 160-9-4.8 MCG/ACT AERO Inhale into the lungs.     cetirizine (ZYRTEC ALLERGY) 10 MG tablet Take 1 tablet (10 mg total) by mouth daily. 90 tablet 1   loratadine (CLARITIN) 10 MG tablet Take 10 mg by mouth daily.     predniSONE (DELTASONE) 5 MG tablet TAKE 1 TABLET BY MOUTH TWICE DAILY AS DIRECTED 180 tablet 1   Respiratory Therapy Supplies (NEBULIZER/TUBING/MOUTHPIECE) KIT Disp one nebulizer machine, tubing set and mouthpiece kit for use 4 times daily 1 kit 0   Spacer/Aero-Holding Chambers DEVI 2 puffs by Does not apply route 2 (two) times daily. Please use with MDI inhalers 1 each 0   acetaminophen (TYLENOL) 325 MG tablet Take 325 mg by mouth every 6 (six) hours as needed.     ezetimibe (ZETIA) 10 MG tablet Take 1 tablet Daily for  Cholesterol 90 tablet 3   rosuvastatin (CRESTOR) 20 MG tablet TAKE 1 TABLET BY MOUTH EVERY DAY FOR CHOLESTEROL (Patient not taking: Reported on 02/17/2022) 90 tablet 1   VITAMIN D PO Take 5,000 Units by mouth daily.     No facility-administered medications prior to visit.       Objective:   Physical Exam:  General appearance: 81 y.o., male, NAD, conversant, thin Eyes: anicteric sclerae; PERRL, tracking appropriately HENT: NCAT; MMM. Deviated septum with narrow  passage on left.  Neck: Trachea midline; no lymphadenopathy, no JVD Lungs: rhonchorous bl, with normal respiratory effort CV: RRR, no murmur  Abdomen: Soft, non-tender; non-distended, BS present  Extremities: No peripheral edema, warm Skin: Normal turgor and texture; no rash Psych: Appropriate affect Neuro: Alert and oriented to person and place, no focal deficit     Vitals:   03/19/22 1037  BP: (!) 140/80  Pulse: (!) 58  SpO2: 95%  Weight: 134 lb 6.4 oz (61 kg)  Height: _0  (1.727 m)      95% on RA BMI Readings from Last 3 Encounters:  03/19/22 20.44 kg/m  01/13/22 20.19 kg/m  12/31/21 19.98 kg/m   Wt Readings from Last 3 Encounters:  03/19/22 134 lb 6.4 oz (61 kg)  01/13/22 132 lb 12.8 oz (60.2 kg)  12/31/21 131 lb 6.4 oz (59.6 kg)     CBC    Component Value Date/Time   WBC 8.7 12/31/2021 1006   RBC 4.16 (L) 12/31/2021 1006   HGB 13.6 12/31/2021 1006   HCT 40.2 12/31/2021 1006   PLT 334 12/31/2021 1006   MCV 96.6 12/31/2021 1006   MCH 32.7 12/31/2021 1006   MCHC 33.8 12/31/2021 1006   RDW 13.1 12/31/2021 1006   LYMPHSABS 948 12/31/2021 1006   MONOABS 0.7 12/21/2020 1546   EOSABS 548 (H) 12/31/2021 1006   BASOSABS 87 12/31/2021 1006    Eos 500-600   Chest Imaging: LDCT CHest 2016 reviewed by me with diffuse bronchial wall thickening, emphysema  CXR 2020 reviewed by me clear lungs  CXR 01/13/22 reviewed by me with coarsened interstitium which could be suggestive of chronic  bronchitic change  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  RTC 6 weeks  Maryjane Hurter, MD [facility] 03/19/2022 10:55 AM

## 2022-03-19 ENCOUNTER — Ambulatory Visit (INDEPENDENT_AMBULATORY_CARE_PROVIDER_SITE_OTHER): Payer: Medicare HMO | Admitting: Student

## 2022-03-19 ENCOUNTER — Encounter: Payer: Self-pay | Admitting: Student

## 2022-03-19 VITALS — BP 140/80 | HR 58 | Ht 68.0 in | Wt 134.4 lb

## 2022-03-19 DIAGNOSIS — R0989 Other specified symptoms and signs involving the circulatory and respiratory systems: Secondary | ICD-10-CM

## 2022-03-19 DIAGNOSIS — Z23 Encounter for immunization: Secondary | ICD-10-CM | POA: Diagnosis not present

## 2022-03-19 MED ORDER — FEXOFENADINE HCL 180 MG PO TABS
180.0000 mg | ORAL_TABLET | Freq: Every day | ORAL | 3 refills | Status: DC
Start: 2022-03-19 — End: 2022-04-28

## 2022-03-19 MED ORDER — GUAIFENESIN ER 600 MG PO TB12: 600.0000 mg | ORAL_TABLET | Freq: Two times a day (BID) | ORAL | 3 refills | Status: DC

## 2022-03-19 NOTE — Patient Instructions (Addendum)
-   guaifenesin (mucinex) 600 mg twice daily when you have chest congestion like this - allegra 180 mg daily when you have postnasal drainage - breztri 2 puffs twice daily with spacer - THEN flutter valve 10 slow but firm puffs twice daily until chest congestion clears up - flonase 1 spray each nostril after clearing nose out following shower - if no better after a few weeks send me a message or call and we can discuss starting dupixent for eosinophilic COPD/asthma and chronic rhinitis - see you in 3 months or sooner if need be!

## 2022-03-20 NOTE — Telephone Encounter (Signed)
Lm-03/20/22-Called this super sweet pt. To complete COPD review call. Completed CAT and pts. Score was 19. Discussed how to identify signs of exacerbation, and discussed ways to help manage his COPD-maintain healthy weight, eat small meals at a time, hydrate, complete tasks/housework slowly to avoid exhaustion. Pt. Feels his COPD is managed well at the moment. Pt. Reported he is most winded while walking up hill./stairs. Pt. Reported that his COPD does not affect his sleep, and doesn't really effect him leaving the house. Pt. Reported his pulmonology visit yesterday went well, and that he was told it may take some time to notice a difference with his Breztri inhaler. Ensured pt. Has no copay barriers, he informed me he has none at the moment. Provided pt. Contact number and encouraged him to call if he has any questions/concerns. PT. Asked me to have his prednisone refilled. Informed pt. I will call PCP on Monday as they are closed at 12PM on Fridays. Pt. Verbalized understanding and agreed.   Total time spent: 21 min.

## 2022-03-20 NOTE — Telephone Encounter (Signed)
LM-03/20/22-Reviewed patients chart to ensure he attended his pulmonologist appointment with Maryjane Hurter, MD.. Reviewed and took some notes from pulm visit notes to use when calling pt. For COPD review call. Notes from OV listed below- He uses breztri 2 puffs twice daily and he's figured out that he needs to rinse his mouth out after using it due to hoarseness. He's only been on the inhaler for the last couple of weeks. Hoarseness is a little better now. He thinks it has been helpful for his breathing. 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

## 2022-03-20 NOTE — Telephone Encounter (Signed)
ADDENDUM TO 10/10 TE ENCOUNTER  TOTAL TIME SPENT: 10 MIN.

## 2022-04-07 DIAGNOSIS — J449 Chronic obstructive pulmonary disease, unspecified: Secondary | ICD-10-CM | POA: Diagnosis not present

## 2022-04-07 DIAGNOSIS — N1831 Chronic kidney disease, stage 3a: Secondary | ICD-10-CM | POA: Diagnosis not present

## 2022-04-07 DIAGNOSIS — I10 Essential (primary) hypertension: Secondary | ICD-10-CM | POA: Diagnosis not present

## 2022-04-07 DIAGNOSIS — E782 Mixed hyperlipidemia: Secondary | ICD-10-CM | POA: Diagnosis not present

## 2022-04-08 DIAGNOSIS — H04522 Eversion of left lacrimal punctum: Secondary | ICD-10-CM | POA: Diagnosis not present

## 2022-04-08 DIAGNOSIS — H57811 Brow ptosis, right: Secondary | ICD-10-CM | POA: Diagnosis not present

## 2022-04-08 DIAGNOSIS — H02135 Senile ectropion of left lower eyelid: Secondary | ICD-10-CM | POA: Diagnosis not present

## 2022-04-08 DIAGNOSIS — H04123 Dry eye syndrome of bilateral lacrimal glands: Secondary | ICD-10-CM | POA: Diagnosis not present

## 2022-04-08 DIAGNOSIS — H0279 Other degenerative disorders of eyelid and periocular area: Secondary | ICD-10-CM | POA: Diagnosis not present

## 2022-04-08 DIAGNOSIS — H53481 Generalized contraction of visual field, right eye: Secondary | ICD-10-CM | POA: Diagnosis not present

## 2022-04-08 DIAGNOSIS — H16212 Exposure keratoconjunctivitis, left eye: Secondary | ICD-10-CM | POA: Diagnosis not present

## 2022-04-08 DIAGNOSIS — H53482 Generalized contraction of visual field, left eye: Secondary | ICD-10-CM | POA: Diagnosis not present

## 2022-04-08 DIAGNOSIS — H53483 Generalized contraction of visual field, bilateral: Secondary | ICD-10-CM | POA: Diagnosis not present

## 2022-04-10 NOTE — Progress Notes (Deleted)
MEDICARE ANNUAL WELLNESS VISIT AND 3 MONTH FOLLOW UP  Assessment:   Leslie Cardenas was seen today for follow-up and medicare wellness.  Diagnoses and all orders for this visit:  Medicare annual wellness visit, subsequent Yearly  Essential hypertension Continue current medications: Olmesartan / HCTZ 20/12.65m Monitor blood pressure at home; call if consistently over 130/80 Continue DASH diet.   Reminder to go to the ER if any CP, SOB, nausea, dizziness, severe HA, changes vision/speech, left arm numbness and tingling and jaw pain. -     CBC with Differential/Platelet -     COMPLETE METABOLIC PANEL WITH GFR  Hyperlipidemia, mixed Continue medications: Rosuvastatin 234mdaily Discussed dietary and exercise modifications Low fat diet -     Lipid panel  Thoracic Aortic Atherosclerosis Continue to control BP, Cholesterol and follow low saturated fat diet, monitor  Abnormal glucose Discussed dietary and exercise modifications A1c  Vitamin D deficiency Continue supplementation Taking Vitamin D 5,000 IU daily, two tablets daily At goal defer lab today  Stage 3a chronic kidney disease Increase fluids  Avoid NSAIDS Blood pressure control Monitor sugars  Will continue to monitor  Gastroesophageal reflux disease without esophagitis Doing well at this time Continue: prilosec 402maily Diet discussed Monitor for triggers Avoid food with high acid content Avoid excessive cafeine Increase water intake  S/P unilateral BKA (below knee amputation), right (HCC) Doing well at this time  Chronic obstructive pulmonary disease, unspecified COPD type (HCC) Continue inhalers as needed and monitor Increased episodes of shortness of breath with activity. Will have pt return for walk test for suupplemental oxygen.  Has quit smoking x 1 year  BPH with obstruction/lower urinary tract symptoms Doing well at this time Continue to monitor  Malignant Melanoma Continue to follow with dermatology,  Dr. WhiElvera LennoxMI 20 Discussed dietary needs and regular eating patterns as well as exercise modifications  Medication management Continued  Flu vaccine Need High dose flu vaccine given  Over 40 minutes of face to face interview, exam, counseling, chart review, and critical decision making was performed.  Future Appointments  Date Time Provider [place] Plains1/11/2021 11:00 AM WilAlycia RossettiP [facility] None  07/15/2022  3:00 PM McKUnk PintoD [facility] None  10/15/2022 10:00 AM WilAlycia RossettiP [facility] None     Plan:   During the course of the visit the patient was educated and counseled about appropriate screening and preventive services including:   Pneumococcal vaccine  Influenza vaccine Prevnar 13 Td vaccine Screening electrocardiogram Colorectal cancer screening Diabetes screening Glaucoma screening Nutrition counseling    Subjective:  Leslie Cardenas a 81 20o. male who presents for Medicare Annual Wellness Visit and 3 month follow up for HTN,HLD, prediabetes, CKDIII, COPD GERD and vitamin D Def.   Pt has noticed his shortness of breath is worsening with walking.  He will feel like he can not get a breath and has associated dizziness at time. He is having the episodes much more frequently. He is interested in home O2 for supplementation when he has these episodes, has had to go to urgent care in the past for this.  BMI is There is no height or weight on file to calculate BMI., he has been working on diet and exercise. Wt Readings from Last 3 Encounters:  03/19/22 134 lb 6.4 oz (61 kg)  01/13/22 132 lb 12.8 oz (60.2 kg)  12/31/21 131 lb 6.4 oz (59.6 kg)    His blood pressure has been controlled at home,  today their BP is   BP Readings from Last 3 Encounters:  03/19/22 (!) 140/80  01/13/22 126/74  12/31/21 140/76    He does workout by means of yard and housework. He denies chest pain, dizziness.  He is on cholesterol medication Zetia  67m and Crestor 20 mg QD and denies myalgias. His cholesterol is not at goal. The cholesterol last visit was:   Lab Results  Component Value Date   CHOL 231 (H) 05/22/2019   HDL 44 05/22/2019   LDLCALC 147 (H) 05/22/2019   TRIG 261 (H) 05/22/2019   CHOLHDL 5.3 (H) 05/22/2019    He has 3 areas of melanoma on each era and right cheek,is going back on the 23rd for further removal of cheek melanoma with dermatology.   He has been working on diet and exercise for prediabetes, and denies hyperglycemia, hypoglycemia , increased appetite, nausea, polydipsia and polyuria. Last A1C in the office was:  Lab Results  Component Value Date   HGBA1C 6.1 (H) 05/22/2019   Last GFR Lab Results  Component Value Date   GFRNONAA 48 (L) 05/22/2019     Lab Results  Component Value Date   GFRAA 55 (L) 05/22/2019   Patient is on Vitamin D supplement.   Lab Results  Component Value Date   VD25OH 70 05/22/2019      Medication Review:  Current Outpatient Medications (Endocrine & Metabolic):    predniSONE (DELTASONE) 5 MG tablet, TAKE 1 TABLET BY MOUTH TWICE DAILY AS DIRECTED  Current Outpatient Medications (Cardiovascular):    ezetimibe (ZETIA) 10 MG tablet, Take 1 tablet Daily for Cholesterol   rosuvastatin (CRESTOR) 20 MG tablet, TAKE 1 TABLET BY MOUTH EVERY DAY FOR CHOLESTEROL (Patient not taking: Reported on 02/17/2022)  Current Outpatient Medications (Respiratory):    albuterol (PROVENTIL) (2.5 MG/3ML) 0.083% nebulizer solution, Inhale 1 vial by Nebulizer 4 x /day or every 4 hr to rescue Asthma   albuterol (VENTOLIN HFA) 108 (90 Base) MCG/ACT inhaler, Inhale 2 puffs into the lungs every 4 (four) hours as needed for wheezing or shortness of breath. Make sure you rinse mouth out after each use.   Budeson-Glycopyrrol-Formoterol (BREZTRI AEROSPHERE) 160-9-4.8 MCG/ACT AERO, Inhale into the lungs.   fexofenadine (ALLEGRA ALLERGY) 180 MG tablet, Take 1 tablet (180 mg total) by mouth daily.    guaiFENesin (MUCINEX) 600 MG 12 hr tablet, Take 1 tablet (600 mg total) by mouth 2 (two) times daily.   loratadine (CLARITIN) 10 MG tablet, Take 10 mg by mouth daily.  Current Outpatient Medications (Analgesics):    acetaminophen (TYLENOL) 325 MG tablet, Take 325 mg by mouth every 6 (six) hours as needed.   Current Outpatient Medications (Other):    Respiratory Therapy Supplies (NEBULIZER/TUBING/MOUTHPIECE) KIT, Disp one nebulizer machine, tubing set and mouthpiece kit for use 4 times daily   Spacer/Aero-Holding Chambers DEVI, 2 puffs by Does not apply route 2 (two) times daily. Please use with [facility]I inhalers   VITAMIN D PO, Take 5,000 Units by mouth daily.  Allergies: No Known Allergies  Current Problems (verified) has Hyperlipidemia, mixed; COPD (chronic obstructive pulmonary disease) ([place]; Sinus bradycardia; History of colonic polyps; Essential hypertension; Prediabetes; Vitamin D deficiency; Medication management; Hx of right BKA ([place]; ASHD hx/o MI; GERD; Medicare annual wellness visit, initial; Subacute osteomyelitis of right tibia ([place]; S/P unilateral BKA (below knee amputation), right ([place]; Nephrolithiasis; Hydronephrosis; FH: hypertension; CKD (chronic kidney disease) stage 3, GFR 30-59 ml/min (HCC); Abnormal glucose; Thoracic aortic atherosclerosis ([place]/2018; Blurred vision;  and TIA (transient ischemic attack) on their problem list.  Screening Tests Immunization History  Administered Date(s) Administered   Fluad Quad(high Dose 65+) 03/19/2022   Influenza Split 04/08/2012   Influenza, High Dose Seasonal PF 03/04/2017, 04/14/2021   Influenza,inj,Quad PF,6+ Mos 05/16/2015, 02/15/2018   Moderna Sars-Covid-2 Vaccination 07/15/2019, 08/12/2019   Pneumococcal Conjugate-13 07/25/2014   Pneumococcal Polysaccharide-23 04/08/2012   Pneumococcal-Unspecified 06/06/2002   Td 06/30/2011   Tdap 10/21/2019   Zoster, Live 05/22/2008    Preventative care: Last colonoscopy:  2018  Prior vaccinations: TD or Tdap: 10/21/19  Influenza:04/14/21 Pneumococcal: 2013 Prevnar13: 2016 Shingles/Zostavax: 2009  Names of Other Physician/Practitioners you currently use: 1. [facility] here for primary care 2.Eye Exam [place] 2021 3. Dentist, has dentures.  Fit well, no issues.  Patient Care Team: Unk Pinto, MD as PCP - General (Internal Medicine) Luberta Mutter, MD as Consulting Physician (Ophthalmology) Gatha Mayer, MD as Consulting Physician (Gastroenterology) Brand Males, MD as Consulting Physician (Pulmonary Disease) Carleene Mains, [facility] as Pharmacist (Pharmacist)  Surgical: He  has a past surgical history that includes right leg amputation (05/14/1977); Cardiac catheterization (7619); Colonoscopy w/ polypectomy (08/12/2010); Inguinal hernia repair; Esophagogastroduodenoscopy; Shoulder arthroscopy (Left, 2000); Cataract extraction, bilateral; Colonoscopy; Cystoscopy w/ ureteral stent placement (Right, 03/04/2018); UPPER GASTROINTESTINAL ENDOSCOPY  WITH DILATION (02/24/2018); Colonoscopy w/ polypectomy (07/16/2016); and Cystoscopy/ureteroscopy/holmium laser/stent placement (Right, 03/16/2018). Family His family history includes Cancer in his mother; Colon cancer in his brother; Hypertension in his father; Leukemia in his brother; Other in his brother; Pancreatic cancer in his mother; Prostate cancer in his brother. Social history  He reports that he quit smoking about 22 years ago. His smoking use included cigarettes. He has a 60.00 pack-year smoking history. He has never used smokeless tobacco. He reports that he does not currently use alcohol. He reports that he does not use drugs.  MEDICARE WELLNESS OBJECTIVES: Physical activity:   Cardiac risk factors:   Depression/mood screen:      10/27/2021   10:47 AM  Depression screen PHQ 2/9  Decreased Interest 0  Down, Depressed, Hopeless 0  PHQ - 2 Score 0  Altered  sleeping 0  Tired, decreased energy 0  Change in appetite 0  Feeling bad or failure about yourself  0  Trouble concentrating 0  Moving slowly or fidgety/restless 0  Suicidal thoughts 0  PHQ-9 Score 0    ADLs:     06/23/2021   10:10 PM 04/14/2021    9:58 AM  In your present state of health, do you have any difficulty performing the following activities:  Hearing?  0  Vision? 0 0  Difficulty concentrating or making decisions? 0 0  Walking or climbing stairs? 0 1  Comment  shortness of breath and fatigue  Dressing or bathing? 0 0  Doing errands, shopping? 0 0     Cognitive Testing  Alert? Yes  Normal Appearance?Yes  Oriented to person? Yes  Place? Yes   Time? Yes  Recall of three objects?  Yes  Can perform simple calculations? Yes  Displays appropriate judgment?Yes  Can read the correct time from a watch face?Yes  EOL planning:     Objective:   There were no vitals filed for this visit.  There is no height or weight on file to calculate BMI.  General appearance: alert, no distress, WD/WN, male HEENT: normocephalic, sclerae anicteric, TMs pearly, nares patent, no discharge or erythema, pharynx normal Oral cavity: MMM, no lesions Neck: supple, no lymphadenopathy, no thyromegaly,  no masses Heart: RRR, normal S1, S2, no murmurs Lungs: Rhonchi noted in all lung fields with wheezing bilateral upper lobes. Abdomen: +bs, soft, non tender, non distended, no masses, no hepatomegaly, no splenomegaly Musculoskeletal: nontender, no swelling, no obvious deformity Skin: excision of melanoma of upper ear bilaterally and right face healing Extremities: no edema, no cyanosis, no clubbing. R BK prosthetic no skin breakdown of R BKA area Pulses: 2+ symmetric, upper and lower extremities, normal cap refill Neurological: alert, oriented x 3, CN2-12 intact, strength normal upper extremities and lower extremities, sensation normal throughout, DTRs 2+ throughout, no cerebellar signs, gait  normal Psychiatric: normal affect, behavior normal, pleasant   Medicare Attestation I have personally reviewed: The patient's medical and social history Their use of alcohol, tobacco or illicit drugs Their current medications and supplements The patient's functional ability including ADLs,fall risks, home safety risks, cognitive, and hearing and visual impairment Diet and physical activities Evidence for depression or mood disorders  The patient's weight, height, BMI, and visual acuity have been recorded in the chart.  I have made referrals, counseling, and provided education to the patient based on review of the above and I have provided the patient with a written personalized care plan for preventive services.     Magda Bernheim ANP-C  [facility]  04/10/2022

## 2022-04-13 ENCOUNTER — Ambulatory Visit: Payer: Medicare HMO | Admitting: Nurse Practitioner

## 2022-04-13 DIAGNOSIS — K219 Gastro-esophageal reflux disease without esophagitis: Secondary | ICD-10-CM

## 2022-04-13 DIAGNOSIS — Z0001 Encounter for general adult medical examination with abnormal findings: Secondary | ICD-10-CM

## 2022-04-13 DIAGNOSIS — Z79899 Other long term (current) drug therapy: Secondary | ICD-10-CM

## 2022-04-13 DIAGNOSIS — I10 Essential (primary) hypertension: Secondary | ICD-10-CM

## 2022-04-13 DIAGNOSIS — E782 Mixed hyperlipidemia: Secondary | ICD-10-CM

## 2022-04-13 DIAGNOSIS — N138 Other obstructive and reflux uropathy: Secondary | ICD-10-CM

## 2022-04-13 DIAGNOSIS — N1831 Chronic kidney disease, stage 3a: Secondary | ICD-10-CM

## 2022-04-13 DIAGNOSIS — Z89511 Acquired absence of right leg below knee: Secondary | ICD-10-CM

## 2022-04-13 DIAGNOSIS — Z6820 Body mass index (BMI) 20.0-20.9, adult: Secondary | ICD-10-CM

## 2022-04-13 DIAGNOSIS — I700 Atherosclerosis of aorta: Secondary | ICD-10-CM

## 2022-04-13 DIAGNOSIS — J449 Chronic obstructive pulmonary disease, unspecified: Secondary | ICD-10-CM

## 2022-04-13 DIAGNOSIS — R7309 Other abnormal glucose: Secondary | ICD-10-CM

## 2022-04-13 DIAGNOSIS — C439 Malignant melanoma of skin, unspecified: Secondary | ICD-10-CM

## 2022-04-20 ENCOUNTER — Telehealth: Payer: Self-pay

## 2022-04-20 NOTE — Telephone Encounter (Signed)
LM-04/20/22-Called patient and completed a General Review Call.  General Review (HC)  Chart Review Have there been any documented new, changed, or discontinued medications since last visit?  (Include name, dose, frequency, date): No  Has there been any documented recent hospitalizations or ED visits since last visit with Clinical Lead?: No  Adherence Review Does the [facility] have access to medication refill data?: Yes Adherence rates for STAR metric medications: Ezetimibe / 90DS / 01/01/22 & 04/03/22 Rosuvastatin / 90DS / 04/17/20 & 04/01/21 Adherence rates for medications indicated for disease state being reviewed: Ezetimibe / 90DS / 01/01/22 & 04/03/22 Rosuvastatin / 90DS / 04/17/20 & 04/01/21 Does the patient have >5 day gap between last estimated fill dates for any of the above medications or other medication gaps?: Yes Reasons for medication gaps: Breztri / 30DS / 06/03/21 & 07/07/21-Pt. stated he uses as needed Prednisone / 90DS / 08/29/21 & 01/06/22-Pt. ran out Ezetimibe / 90DS / 01/01/22 & 04/03/22-Pt. stated he tries to take it every morning, and occasionally misses a dose Rosuvastatin / 90DS / 04/17/20 & 04/01/21-Pt. stated he occasionally misses a dose  Disease State Questions Able to connect with the Patient?: Yes Did patient have any problems with their health recently?: No Have you had any admissions or emergency room visits or worsening of your condition(s) since last visit?: No Have you had any visits with new specialists or providers since your last visit?: Yes Explain:: eye doctor had eyelids on the 28th.  Have you had any new health care problem(s) since your last visit?: No Have you run out of any of your medications since you last spoke with your clinical lead?: Yes What caused you to run out of your medication?: Prednisone- Pt. stated he ran out and needed to call and get it refilled. Connected pt. to PCP to ask for a refill.  Are there any medications you are  not taking as prescribed?: No Are you having any issues or side effects with your medications?: No Do you have any other health concerns or questions you want to discuss with your Clinical lead  before your next visit?: No Are there any health concerns that you feel we can do a better job addressing?: No Any falls since last visit?: No Any increased or uncontrolled pain since last visit?: No Next visit Type:: Phone Visit with:: HC Date:: 04/20/2022 Time:: 11AM Additional Details: Right direct brow ptosis repair, left lower lid ectropion repair, left irrigation of tear duct surgery on 11/28.  Total time spent: 14 min.

## 2022-04-27 NOTE — Progress Notes (Unsigned)
MEDICARE ANNUAL WELLNESS VISIT AND 3 MONTH FOLLOW UP  Assessment:   Leslie Cardenas was seen today for follow-up and medicare wellness.  Diagnoses and all orders for this visit:  Medicare annual wellness visit, subsequent Yearly  Essential hypertension Currently controlled without medication- if continues to have dizziness and BP remains low notify the office Monitor blood pressure at home; call if consistently over 130/80 Continue DASH diet.   Reminder to go to the ER if any CP, SOB, nausea, dizziness, severe HA, changes vision/speech, left arm numbness and tingling and jaw pain. -     CBC with Differential/Platelet -     COMPLETE METABOLIC PANEL WITH GFR  Hyperlipidemia, mixed/ Statin myopathy Continue medications:Zetia 10 mg, unable to tolerate Rosuvastatin due to myalgias Discussed dietary and exercise modifications Low fat diet -     Lipid panel  Thoracic Aortic Atherosclerosis Continue to control BP, Cholesterol and follow low saturated fat diet, monitor  Abnormal glucose Discussed dietary and exercise modifications A1c  Lid lag bilaterally Has blepharoplasty bilaterally scheduled next week  Vitamin D deficiency Continue supplementation Taking Vitamin D 5,000 IU daily, two tablets daily At goal defer lab today  Stage 3a chronic kidney disease Increase fluids  Avoid NSAIDS Blood pressure control Monitor sugars  Will continue to monitor - CBC, CMP  Gastroesophageal reflux 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 MVerlee Montepulmonology  BPH with obstruction/lower urinary tract symptoms Doing well at this  time Continue to monitor  Malignant Melanoma(HCC) Continue to follow with dermatology, Dr. WElvera Lennox BMI 20 Discussed dietary needs and regular eating patterns as well as exercise modifications  Medication management Magnesium    Over 40 minutes of face to face interview, exam, counseling, chart review, and critical decision making was performed.  Future Appointments  Date Time Provider [place] 07/15/2022  3:00 PM MUnk Pinto MD [facility] None     Plan:   During the course of the visit the patient was educated and counseled about appropriate screening and preventive services including:   Pneumococcal vaccine  Influenza vaccine Prevnar 13 Td vaccine Screening electrocardiogram Colorectal cancer screening Diabetes screening Glaucoma screening Nutrition counseling    Subjective:  HFLOYDE DINGLEYis a 81y.o. male who presents for Medicare Annual Wellness Visit and 3 month follow up for HTN,HLD, prediabetes, CKDIII, COPD GERD and vitamin D Def.   He is having blepharoplasty bilaterally next week.   He is following with Dr. MVerlee Monte pulmonologist for Class 3 severe COPD.  Currently on Breztri daily and Albuterol PRN. He has been on Prednisone 5 mg BID but has not had filled for several months and symptoms have worsened. Does continue to have dyspnea on exertion. No oxygen use yet.   BMI is Body mass index is 20.56 kg/m., he has been working on diet and exercise. He has been eating better.  Wt Readings from Last 3 Encounters:  04/28/22 135 lb 3.2 oz (61.3 kg)  03/19/22 134 lb 6.4 oz (61 kg)  01/13/22 132 lb 12.8 oz (60.2 kg)    His blood pressure has been controlled at home, today their BP is BP: 118/70 He will occasionally get dizziness with  position change.  BP Readings from Last 3 Encounters:  04/28/22 118/70  03/19/22 (!) 140/80  01/13/22 126/74    He does workout by means of yard and housework. He denies chest pain, dizziness.  He is on  cholesterol medication Zetia 86m a, not taking Rosuvastatin gave myalgias)His cholesterol is not at goal. The cholesterol last visit was:   Lab Results  Component Value Date   CHOL 200 (H) 12/31/2021   HDL 46 12/31/2021   LDLCALC 122 (H) 12/31/2021   TRIG 200 (H) 12/31/2021   CHOLHDL 4.3 12/31/2021     He had 3 areas of melanoma on each ear and right cheek which have been removed.  Continues to follow with dermatology.Next appt 05/19/22  He has been working on diet and exercise for prediabetes, and denies hyperglycemia, hypoglycemia , increased appetite, nausea, polydipsia and polyuria. Last A1C in the office was:  Lab Results  Component Value Date   HGBA1C 5.9 (H) 12/31/2021     Last GFR Lab Results  Component Value Date   EGFR 69 12/31/2021    Patient is on Vitamin D supplement.   Lab Results  Component Value Date   VD25OH 42 12/31/2021          Medication Review:  Current Outpatient Medications (Endocrine & Metabolic):    predniSONE (DELTASONE) 5 MG tablet, Take 1 tablet daily as directed for COPD  Current Outpatient Medications (Cardiovascular):    ezetimibe (ZETIA) 10 MG tablet, Take 1 tablet Daily for Cholesterol   nitroGLYCERIN (NITROSTAT) 0.4 MG SL tablet, 1 TAB UNDER TONGUE EVERY 5 MIN UP TO 3 DOSES AS NEEDED FOR CHEST PAIN   rosuvastatin (CRESTOR) 20 MG tablet, TAKE 1 TABLET BY MOUTH EVERY DAY FOR CHOLESTEROL (Patient not taking: Reported on 02/17/2022)  Current Outpatient Medications (Respiratory):    albuterol (VENTOLIN HFA) 108 (90 Base) MCG/ACT inhaler, Inhale 2 puffs into the lungs every 4 (four) hours as needed for wheezing or shortness of breath. Make sure you rinse mouth out after each use.   Budeson-Glycopyrrol-Formoterol (BREZTRI AEROSPHERE) 160-9-4.8 MCG/ACT AERO, Inhale into the lungs.   guaiFENesin (MUCINEX) 600 MG 12 hr tablet, Take 1 tablet (600 mg total) by mouth 2 (two) times daily.   loratadine (CLARITIN) 10 MG tablet, Take 10 mg by mouth  daily.   albuterol (PROVENTIL) (2.5 MG/3ML) 0.083% nebulizer solution, Inhale 1 vial by Nebulizer 4 x /day or every 4 hr to rescue Asthma (Patient not taking: Reported on 04/28/2022)   fexofenadine (ALLEGRA ALLERGY) 180 MG tablet, Take 1 tablet (180 mg total) by mouth daily. (Patient not taking: Reported on 04/28/2022)   fluticasone (FLONASE) 50 MCG/ACT nasal spray, SPRAY 1 SPRAY INTO BOTH NOSTRILS DAILY.  Current Outpatient Medications (Analgesics):    acetaminophen (TYLENOL) 325 MG tablet, Take 325 mg by mouth every 6 (six) hours as needed.   Current Outpatient Medications (Other):    VITAMIN D PO, Take 5,000 Units by mouth daily.   Multiple Vitamin (MULTIVITAMIN) tablet, Take 1 tablet by mouth daily.   Respiratory Therapy Supplies (NEBULIZER/TUBING/MOUTHPIECE) KIT, Disp one nebulizer machine, tubing set and mouthpiece kit for use 4 times daily   Spacer/Aero-Holding Chambers DEVI, 2 puffs by Does not apply route 2 (two) times daily. 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  Screening Tests Immunization History  Administered Date(s) Administered   Fluad Quad(high Dose 65+) 03/19/2022   Influenza Split 04/08/2012   Influenza, High Dose Seasonal PF 03/04/2017, 04/14/2021   Influenza,inj,Quad PF,6+  Mos 05/16/2015, 02/15/2018   Moderna Sars-Covid-2 Vaccination 07/15/2019, 08/12/2019   Pneumococcal Conjugate-13 07/25/2014   Pneumococcal Polysaccharide-23 04/08/2012   Pneumococcal-Unspecified 06/06/2002   Td 06/30/2011   Tdap 10/21/2019   Zoster, Live 05/22/2008   Health Maintenance  Topic Date Due   COVID-19 Vaccine (3 - Moderna risk series) 05/14/2022 (Originally 09/09/2019)   Zoster Vaccines- Shingrix (1 of 2) 07/29/2022 (Originally 12/07/1959)   Medicare Annual Wellness (AWV)  04/29/2023   Pneumonia Vaccine 58+ Years old  Completed   INFLUENZA VACCINE  Completed   HPV VACCINES  Aged Out     Names of Other Physician/Practitioners you currently use: 1. [facility] here for primary care 2.Eye Exam [place] 2023 3. Dentist, has dentures.  Fit well, no issues.  Patient Care Team: Leslie Pinto, MD as PCP - General (Internal Medicine) Luberta Mutter, MD as Consulting Physician (Ophthalmology) Gatha Mayer, MD as Consulting Physician (Gastroenterology) Brand Males, MD as Consulting Physician (Pulmonary Disease) Carleene Mains, [facility] as Pharmacist (Pharmacist)  Surgical: He  has a past surgical history that includes right leg amputation (05/14/1977); Cardiac catheterization (3536); Colonoscopy w/ polypectomy (08/12/2010); Inguinal hernia repair; Esophagogastroduodenoscopy; Shoulder arthroscopy (Left, 2000); Cataract extraction, bilateral; Colonoscopy; Cystoscopy w/ ureteral stent placement (Right, 03/04/2018); UPPER GASTROINTESTINAL ENDOSCOPY  WITH DILATION (02/24/2018); Colonoscopy w/ polypectomy (07/16/2016); and Cystoscopy/ureteroscopy/holmium laser/stent placement (Right, 03/16/2018). Family His family history includes Cancer in his mother; Colon cancer in his brother; Hypertension in his father; Leukemia in his brother; Other in his brother; Pancreatic cancer in his mother; Prostate cancer in his brother. Social history  He reports  that he quit smoking about 22 years ago. His smoking use included cigarettes. He has a 60.00 pack-year smoking history. He has never used smokeless tobacco. He reports that he does not currently use alcohol. He reports that he does not use drugs.  MEDICARE WELLNESS OBJECTIVES: Physical activity: Current Exercise Habits: Home exercise routine, Type of exercise: walking, Time (Minutes): 30, Frequency (Times/Week): 3, Weekly Exercise (Minutes/Week): 90, Exercise limited by: respiratory conditions(s) Cardiac risk factors: Cardiac Risk Factors include: advanced age (>74mn, >>6women);dyslipidemia;hypertension;male gender;sedentary lifestyle;smoking/ tobacco exposure Depression/mood screen:      04/28/2022   12:07 PM  Depression screen PHQ 2/9  Decreased Interest 0  Down, Depressed, Hopeless 0  PHQ - 2 Score 0    ADLs:     04/28/2022   12:08 PM 06/23/2021   10:10 PM  In your present state of health, do you have any difficulty performing the following activities:  Hearing? 0   Vision? 0 0  Difficulty concentrating or making decisions? 0 0  Walking or climbing stairs? 1 0  Dressing or bathing? 0 0  Doing errands, shopping? 0 0     Cognitive Testing  Alert? Yes  Normal Appearance?Yes  Oriented to person? Yes  Place? Yes   Time? Yes  Recall of three objects?  Yes  Can perform simple calculations? Yes  Displays appropriate judgment?Yes  Can read the  correct time from a watch face?Yes  EOL planning: Does Patient Have a Medical Advance Directive?: No Would patient like information on creating a medical advance directive?: No - Patient declined   Objective:   Today's Vitals   04/28/22 1136  BP: 118/70  Pulse: 88  Temp: (!) 97.5 F (36.4 C)  SpO2: 94%  Weight: 135 lb 3.2 oz (61.3 kg)  Height: _0  (1.727 m)    Body mass index is 20.56 kg/m.  General appearance: alert, no distress, WD/WN, male HEENT: normocephalic, sclerae anicteric, TMs pearly, nares patent, no discharge  or erythema, pharynx normal. Lid lag of right upper lid and left lower lid Oral cavity: MMM, no lesions Neck: supple, no lymphadenopathy, no thyromegaly, no masses Heart: RRR, normal S1, S2, no murmurs Lungs: Rhonchi noted in all lung fields with wheezing bilateral upper lobes. Abdomen: +bs, soft, non tender, non distended, no masses, no hepatomegaly, no splenomegaly Musculoskeletal: nontender, no swelling, no obvious deformity Skin: warm and dry Extremities: no edema, no cyanosis, no clubbing. R BK prosthetic no skin breakdown of R BKA area Pulses: 2+ symmetric, upper and lower extremities, normal cap refill Neurological: alert, oriented x 3, CN2-12 intact, strength normal upper extremities and lower extremities, sensation normal throughout, DTRs 2+ throughout, no cerebellar signs, gait normal Psychiatric: normal affect, behavior normal, pleasant   Medicare Attestation I have personally reviewed: The patient's medical and social history Their use of alcohol, tobacco or illicit drugs Their current medications and supplements The patient's functional ability including ADLs,fall risks, home safety risks, cognitive, and hearing and visual impairment Diet and physical activities Evidence for depression or mood disorders  The patient's weight, height, BMI, and visual acuity have been recorded in the chart.  I have made referrals, counseling, and provided education to the patient based on review of the above and I have provided the patient with a written personalized care plan for preventive services.     Magda Bernheim ANP-C  [facility]  04/28/2022

## 2022-04-28 ENCOUNTER — Ambulatory Visit (INDEPENDENT_AMBULATORY_CARE_PROVIDER_SITE_OTHER): Payer: Medicare HMO | Admitting: Nurse Practitioner

## 2022-04-28 ENCOUNTER — Encounter: Payer: Self-pay | Admitting: Nurse Practitioner

## 2022-04-28 VITALS — BP 118/70 | HR 88 | Temp 97.5°F | Ht 68.0 in | Wt 135.2 lb

## 2022-04-28 DIAGNOSIS — K219 Gastro-esophageal reflux disease without esophagitis: Secondary | ICD-10-CM | POA: Diagnosis not present

## 2022-04-28 DIAGNOSIS — Z79899 Other long term (current) drug therapy: Secondary | ICD-10-CM | POA: Diagnosis not present

## 2022-04-28 DIAGNOSIS — I10 Essential (primary) hypertension: Secondary | ICD-10-CM | POA: Diagnosis not present

## 2022-04-28 DIAGNOSIS — N1831 Chronic kidney disease, stage 3a: Secondary | ICD-10-CM | POA: Diagnosis not present

## 2022-04-28 DIAGNOSIS — Z89511 Acquired absence of right leg below knee: Secondary | ICD-10-CM

## 2022-04-28 DIAGNOSIS — C439 Malignant melanoma of skin, unspecified: Secondary | ICD-10-CM | POA: Diagnosis not present

## 2022-04-28 DIAGNOSIS — N401 Enlarged prostate with lower urinary tract symptoms: Secondary | ICD-10-CM

## 2022-04-28 DIAGNOSIS — N138 Other obstructive and reflux uropathy: Secondary | ICD-10-CM

## 2022-04-28 DIAGNOSIS — E782 Mixed hyperlipidemia: Secondary | ICD-10-CM | POA: Diagnosis not present

## 2022-04-28 DIAGNOSIS — I700 Atherosclerosis of aorta: Secondary | ICD-10-CM | POA: Diagnosis not present

## 2022-04-28 DIAGNOSIS — T466X5A Adverse effect of antihyperlipidemic and antiarteriosclerotic drugs, initial encounter: Secondary | ICD-10-CM

## 2022-04-28 DIAGNOSIS — R6889 Other general symptoms and signs: Secondary | ICD-10-CM | POA: Diagnosis not present

## 2022-04-28 DIAGNOSIS — Z0001 Encounter for general adult medical examination with abnormal findings: Secondary | ICD-10-CM | POA: Diagnosis not present

## 2022-04-28 DIAGNOSIS — G720 Drug-induced myopathy: Secondary | ICD-10-CM

## 2022-04-28 DIAGNOSIS — R7309 Other abnormal glucose: Secondary | ICD-10-CM | POA: Diagnosis not present

## 2022-04-28 DIAGNOSIS — J449 Chronic obstructive pulmonary disease, unspecified: Secondary | ICD-10-CM

## 2022-04-28 DIAGNOSIS — H02539 Eyelid retraction unspecified eye, unspecified lid: Secondary | ICD-10-CM

## 2022-04-28 DIAGNOSIS — Z6820 Body mass index (BMI) 20.0-20.9, adult: Secondary | ICD-10-CM

## 2022-04-28 MED ORDER — PREDNISONE 5 MG PO TABS: ORAL_TABLET | ORAL | 1 refills | Status: DC

## 2022-04-28 NOTE — Patient Instructions (Signed)

## 2022-04-29 LAB — CBC WITH DIFFERENTIAL/PLATELET
Absolute Monocytes: 125 cells/uL — ABNORMAL LOW (ref 200–950)
Basophils Absolute: 114 cells/uL (ref 0–200)
Basophils Relative: 1.1 %
Eosinophils Absolute: 0 cells/uL — ABNORMAL LOW (ref 15–500)
Eosinophils Relative: 0 %
HCT: 43.5 % (ref 38.5–50.0)
Hemoglobin: 15 g/dL (ref 13.2–17.1)
Lymphs Abs: 333 cells/uL — ABNORMAL LOW (ref 850–3900)
MCH: 33 pg (ref 27.0–33.0)
MCHC: 34.5 g/dL (ref 32.0–36.0)
MCV: 95.6 fL (ref 80.0–100.0)
MPV: 9.1 fL (ref 7.5–12.5)
Monocytes Relative: 1.2 %
Neutro Abs: 9828 cells/uL — ABNORMAL HIGH (ref 1500–7800)
Neutrophils Relative %: 94.5 %
Platelets: 529 10*3/uL — ABNORMAL HIGH (ref 140–400)
RBC: 4.55 10*6/uL (ref 4.20–5.80)
RDW: 13.1 % (ref 11.0–15.0)
Total Lymphocyte: 3.2 %
WBC: 10.4 10*3/uL (ref 3.8–10.8)

## 2022-04-29 LAB — LIPID PANEL
Cholesterol: 203 mg/dL — ABNORMAL HIGH (ref <200)
HDL: 52 mg/dL (ref >=40)
LDL Cholesterol (Calc): 129 mg/dL (calc) — ABNORMAL HIGH
Non-HDL Cholesterol (Calc): 151 mg/dL (calc) — ABNORMAL HIGH (ref <130)
Total CHOL/HDL Ratio: 3.9 (calc) (ref <5.0)
Triglycerides: 109 mg/dL (ref <150)

## 2022-04-29 LAB — COMPLETE METABOLIC PANEL WITH GFR
AG Ratio: 1.4 (calc) (ref 1.0–2.5)
ALT: 11 U/L (ref 9–46)
AST: 16 U/L (ref 10–35)
Albumin: 4.2 g/dL (ref 3.6–5.1)
Alkaline phosphatase (APISO): 75 U/L (ref 35–144)
BUN: 13 mg/dL (ref 7–25)
CO2: 27 mmol/L (ref 20–32)
Calcium: 10 mg/dL (ref 8.6–10.3)
Chloride: 101 mmol/L (ref 98–110)
Creat: 1.18 mg/dL (ref 0.70–1.22)
Globulin: 3 g/dL (calc) (ref 1.9–3.7)
Glucose, Bld: 150 mg/dL — ABNORMAL HIGH (ref 65–99)
Potassium: 4.9 mmol/L (ref 3.5–5.3)
Sodium: 137 mmol/L (ref 135–146)
Total Bilirubin: 0.3 mg/dL (ref 0.2–1.2)
Total Protein: 7.2 g/dL (ref 6.1–8.1)
eGFR: 62 mL/min/{1.73_m2} (ref >=60)

## 2022-04-29 LAB — MAGNESIUM: Magnesium: 2 mg/dL (ref 1.5–2.5)

## 2022-04-29 LAB — HEMOGLOBIN A1C
Hgb A1c MFr Bld: 6.1 % of total Hgb — ABNORMAL HIGH (ref <5.7)
Mean Plasma Glucose: 128 mg/dL
eAG (mmol/L): 7.1 mmol/L

## 2022-05-05 DIAGNOSIS — H04123 Dry eye syndrome of bilateral lacrimal glands: Secondary | ICD-10-CM | POA: Diagnosis not present

## 2022-05-05 DIAGNOSIS — H04222 Epiphora due to insufficient drainage, left lacrimal gland: Secondary | ICD-10-CM | POA: Diagnosis not present

## 2022-05-05 DIAGNOSIS — H16212 Exposure keratoconjunctivitis, left eye: Secondary | ICD-10-CM | POA: Diagnosis not present

## 2022-05-05 DIAGNOSIS — H04522 Eversion of left lacrimal punctum: Secondary | ICD-10-CM | POA: Diagnosis not present

## 2022-05-05 DIAGNOSIS — H57811 Brow ptosis, right: Secondary | ICD-10-CM | POA: Diagnosis not present

## 2022-05-05 DIAGNOSIS — H0279 Other degenerative disorders of eyelid and periocular area: Secondary | ICD-10-CM | POA: Diagnosis not present

## 2022-05-05 DIAGNOSIS — H02135 Senile ectropion of left lower eyelid: Secondary | ICD-10-CM | POA: Diagnosis not present

## 2022-05-05 DIAGNOSIS — H53481 Generalized contraction of visual field, right eye: Secondary | ICD-10-CM | POA: Diagnosis not present

## 2022-05-15 DIAGNOSIS — L570 Actinic keratosis: Secondary | ICD-10-CM | POA: Diagnosis not present

## 2022-05-15 DIAGNOSIS — Z85828 Personal history of other malignant neoplasm of skin: Secondary | ICD-10-CM | POA: Diagnosis not present

## 2022-05-15 DIAGNOSIS — L281 Prurigo nodularis: Secondary | ICD-10-CM | POA: Diagnosis not present

## 2022-05-15 DIAGNOSIS — L814 Other melanin hyperpigmentation: Secondary | ICD-10-CM | POA: Diagnosis not present

## 2022-05-25 ENCOUNTER — Encounter: Payer: Self-pay | Admitting: Internal Medicine

## 2022-06-10 ENCOUNTER — Observation Stay (HOSPITAL_COMMUNITY)
Admission: EM | Admit: 2022-06-10 | Discharge: 2022-06-12 | Disposition: A | Discharge status: Home Health | Payer: Medicare HMO | Attending: Family Medicine | Admitting: Family Medicine

## 2022-06-10 DIAGNOSIS — R29810 Facial weakness: Secondary | ICD-10-CM | POA: Diagnosis not present

## 2022-06-10 DIAGNOSIS — E782 Mixed hyperlipidemia: Secondary | ICD-10-CM | POA: Diagnosis present

## 2022-06-10 DIAGNOSIS — J449 Chronic obstructive pulmonary disease, unspecified: Secondary | ICD-10-CM | POA: Diagnosis not present

## 2022-06-10 DIAGNOSIS — Z85828 Personal history of other malignant neoplasm of skin: Secondary | ICD-10-CM | POA: Insufficient documentation

## 2022-06-10 DIAGNOSIS — Z87891 Personal history of nicotine dependence: Secondary | ICD-10-CM | POA: Diagnosis not present

## 2022-06-10 DIAGNOSIS — R200 Anesthesia of skin: Secondary | ICD-10-CM | POA: Diagnosis present

## 2022-06-10 DIAGNOSIS — I10 Essential (primary) hypertension: Secondary | ICD-10-CM | POA: Insufficient documentation

## 2022-06-10 DIAGNOSIS — I2510 Atherosclerotic heart disease of native coronary artery without angina pectoris: Secondary | ICD-10-CM | POA: Diagnosis not present

## 2022-06-10 DIAGNOSIS — G459 Transient cerebral ischemic attack, unspecified: Secondary | ICD-10-CM | POA: Diagnosis not present

## 2022-06-10 DIAGNOSIS — R001 Bradycardia, unspecified: Secondary | ICD-10-CM | POA: Diagnosis not present

## 2022-06-10 DIAGNOSIS — Z79899 Other long term (current) drug therapy: Secondary | ICD-10-CM | POA: Diagnosis not present

## 2022-06-11 ENCOUNTER — Observation Stay (HOSPITAL_COMMUNITY): Payer: Medicare HMO

## 2022-06-11 ENCOUNTER — Emergency Department (HOSPITAL_COMMUNITY): Payer: Medicare HMO

## 2022-06-11 ENCOUNTER — Other Ambulatory Visit (HOSPITAL_COMMUNITY): Payer: Self-pay | Admitting: *Deleted

## 2022-06-11 ENCOUNTER — Observation Stay (HOSPITAL_BASED_OUTPATIENT_CLINIC_OR_DEPARTMENT_OTHER): Payer: Medicare HMO

## 2022-06-11 DIAGNOSIS — R531 Weakness: Secondary | ICD-10-CM | POA: Diagnosis not present

## 2022-06-11 DIAGNOSIS — J449 Chronic obstructive pulmonary disease, unspecified: Secondary | ICD-10-CM | POA: Diagnosis not present

## 2022-06-11 DIAGNOSIS — G459 Transient cerebral ischemic attack, unspecified: Secondary | ICD-10-CM

## 2022-06-11 DIAGNOSIS — I10 Essential (primary) hypertension: Secondary | ICD-10-CM

## 2022-06-11 DIAGNOSIS — Z8673 Personal history of transient ischemic attack (TIA), and cerebral infarction without residual deficits: Secondary | ICD-10-CM | POA: Diagnosis not present

## 2022-06-11 DIAGNOSIS — I6622 Occlusion and stenosis of left posterior cerebral artery: Secondary | ICD-10-CM | POA: Diagnosis not present

## 2022-06-11 DIAGNOSIS — I6523 Occlusion and stenosis of bilateral carotid arteries: Secondary | ICD-10-CM | POA: Diagnosis not present

## 2022-06-11 DIAGNOSIS — E782 Mixed hyperlipidemia: Secondary | ICD-10-CM

## 2022-06-11 DIAGNOSIS — R29810 Facial weakness: Secondary | ICD-10-CM | POA: Diagnosis not present

## 2022-06-11 DIAGNOSIS — I771 Stricture of artery: Secondary | ICD-10-CM | POA: Diagnosis not present

## 2022-06-11 DIAGNOSIS — R200 Anesthesia of skin: Secondary | ICD-10-CM | POA: Diagnosis not present

## 2022-06-11 DIAGNOSIS — G9389 Other specified disorders of brain: Secondary | ICD-10-CM | POA: Diagnosis not present

## 2022-06-11 LAB — COMPREHENSIVE METABOLIC PANEL
ALT: 13 U/L (ref 0–44)
ALT: 14 U/L (ref 0–44)
AST: 18 U/L (ref 15–41)
AST: 22 U/L (ref 15–41)
Albumin: 3.4 g/dL — ABNORMAL LOW (ref 3.5–5.0)
Albumin: 3.9 g/dL (ref 3.5–5.0)
Alkaline Phosphatase: 55 U/L (ref 38–126)
Alkaline Phosphatase: 63 U/L (ref 38–126)
Anion gap: 7 (ref 5–15)
Anion gap: 8 (ref 5–15)
BUN: 15 mg/dL (ref 8–23)
BUN: 17 mg/dL (ref 8–23)
CO2: 26 mmol/L (ref 22–32)
CO2: 27 mmol/L (ref 22–32)
Calcium: 8.9 mg/dL (ref 8.9–10.3)
Calcium: 9.1 mg/dL (ref 8.9–10.3)
Chloride: 103 mmol/L (ref 98–111)
Chloride: 103 mmol/L (ref 98–111)
Creatinine, Ser: 1.17 mg/dL (ref 0.61–1.24)
Creatinine, Ser: 1.19 mg/dL (ref 0.61–1.24)
GFR, Estimated: >60 mL/min (ref >60)
GFR, Estimated: >60 mL/min (ref >60)
Glucose, Bld: 107 mg/dL — ABNORMAL HIGH (ref 70–99)
Glucose, Bld: 90 mg/dL (ref 70–99)
Potassium: 3.7 mmol/L (ref 3.5–5.1)
Potassium: 3.8 mmol/L (ref 3.5–5.1)
Sodium: 136 mmol/L (ref 135–145)
Sodium: 138 mmol/L (ref 135–145)
Total Bilirubin: 0.5 mg/dL (ref 0.3–1.2)
Total Bilirubin: 0.6 mg/dL (ref 0.3–1.2)
Total Protein: 6 g/dL — ABNORMAL LOW (ref 6.5–8.1)
Total Protein: 6.8 g/dL (ref 6.5–8.1)

## 2022-06-11 LAB — LIPID PANEL
Cholesterol: 226 mg/dL — ABNORMAL HIGH (ref 0–200)
Cholesterol: 201 mg/dL — ABNORMAL HIGH (ref 0–200)
HDL: 49 mg/dL (ref >40)
HDL: 39 mg/dL — ABNORMAL LOW (ref >40)
LDL Cholesterol: 143 mg/dL — ABNORMAL HIGH (ref 0–99)
LDL Cholesterol: 104 mg/dL — ABNORMAL HIGH (ref 0–99)
Total CHOL/HDL Ratio: 4.6 RATIO
Total CHOL/HDL Ratio: 5.2 RATIO
Triglycerides: 171 mg/dL — ABNORMAL HIGH (ref <150)
Triglycerides: 288 mg/dL — ABNORMAL HIGH (ref <150)
VLDL: 34 mg/dL (ref 0–40)
VLDL: 58 mg/dL — ABNORMAL HIGH (ref 0–40)

## 2022-06-11 LAB — RAPID URINE DRUG SCREEN, HOSP PERFORMED
Amphetamines: NOT DETECTED
Barbiturates: NOT DETECTED
Benzodiazepines: NOT DETECTED
Cocaine: NOT DETECTED
Opiates: NOT DETECTED
Tetrahydrocannabinol: NOT DETECTED

## 2022-06-11 LAB — CBC
HCT: 39.9 % (ref 39.0–52.0)
HCT: 41.3 % (ref 39.0–52.0)
Hemoglobin: 13.1 g/dL (ref 13.0–17.0)
Hemoglobin: 13.7 g/dL (ref 13.0–17.0)
MCH: 32.9 pg (ref 26.0–34.0)
MCH: 32.9 pg (ref 26.0–34.0)
MCHC: 32.8 g/dL (ref 30.0–36.0)
MCHC: 33.2 g/dL (ref 30.0–36.0)
MCV: 100.3 fL — ABNORMAL HIGH (ref 80.0–100.0)
MCV: 99.3 fL (ref 80.0–100.0)
Platelets: 334 10*3/uL (ref 150–400)
Platelets: 384 10*3/uL (ref 150–400)
RBC: 3.98 MIL/uL — ABNORMAL LOW (ref 4.22–5.81)
RBC: 4.16 MIL/uL — ABNORMAL LOW (ref 4.22–5.81)
RDW: 13.8 % (ref 11.5–15.5)
RDW: 13.8 % (ref 11.5–15.5)
WBC: 6 10*3/uL (ref 4.0–10.5)
WBC: 6.8 10*3/uL (ref 4.0–10.5)
nRBC: 0 % (ref 0.0–0.2)
nRBC: 0 % (ref 0.0–0.2)

## 2022-06-11 LAB — PROTIME-INR
INR: 1 (ref 0.8–1.2)
Prothrombin Time: 12.8 seconds (ref 11.4–15.2)

## 2022-06-11 LAB — APTT: aPTT: 36 seconds (ref 24–36)

## 2022-06-11 LAB — DIFFERENTIAL
Abs Immature Granulocytes: 0.06 10*3/uL (ref 0.00–0.07)
Basophils Absolute: 0.1 10*3/uL (ref 0.0–0.1)
Basophils Relative: 1 %
Eosinophils Absolute: 0.4 10*3/uL (ref 0.0–0.5)
Eosinophils Relative: 5 %
Immature Granulocytes: 1 %
Lymphocytes Relative: 20 %
Lymphs Abs: 1.4 10*3/uL (ref 0.7–4.0)
Monocytes Absolute: 0.8 10*3/uL (ref 0.1–1.0)
Monocytes Relative: 12 %
Neutro Abs: 4.1 10*3/uL (ref 1.7–7.7)
Neutrophils Relative %: 61 %

## 2022-06-11 LAB — URINALYSIS, ROUTINE W REFLEX MICROSCOPIC
Bacteria, UA: NONE SEEN
Bilirubin Urine: NEGATIVE
Glucose, UA: NEGATIVE mg/dL
Ketones, ur: NEGATIVE mg/dL
Leukocytes,Ua: NEGATIVE
Nitrite: NEGATIVE
Protein, ur: NEGATIVE mg/dL
Specific Gravity, Urine: 1.011 (ref 1.005–1.030)
pH: 6 (ref 5.0–8.0)

## 2022-06-11 LAB — TSH: TSH: 1.759 u[IU]/mL (ref 0.350–4.500)

## 2022-06-11 LAB — ECHOCARDIOGRAM COMPLETE
Area-P 1/2: 1.89 cm2
Height: 70 in
S' Lateral: 2.1 cm
Weight: 2080 oz

## 2022-06-11 LAB — ETHANOL: Alcohol, Ethyl (B): <10 mg/dL (ref <10)

## 2022-06-11 MED ORDER — ASPIRIN 81 MG PO TBEC
81.0000 mg | DELAYED_RELEASE_TABLET | Freq: Every day | ORAL | Status: DC
  Filled 2022-06-11: qty 1

## 2022-06-11 MED ORDER — IOHEXOL 350 MG/ML SOLN
100.0000 mL | Freq: Once | INTRAVENOUS | Status: AC | PRN
  Administered 2022-06-11: 75 mL via INTRAVENOUS

## 2022-06-11 MED ORDER — ACETAMINOPHEN 650 MG RE SUPP: 650.0000 mg | RECTAL | Status: DC | PRN

## 2022-06-11 MED ORDER — ACETAMINOPHEN 325 MG PO TABS: 650.0000 mg | ORAL_TABLET | ORAL | Status: DC | PRN

## 2022-06-11 MED ORDER — ASPIRIN 81 MG PO TBEC
81.0000 mg | DELAYED_RELEASE_TABLET | Freq: Every day | ORAL | Status: DC
  Administered 2022-06-12: 81 mg via ORAL
  Filled 2022-06-11: qty 1

## 2022-06-11 MED ORDER — ALPRAZOLAM 0.5 MG PO TABS
0.5000 mg | ORAL_TABLET | Freq: Once | ORAL | Status: AC
  Administered 2022-06-11: 0.5 mg via ORAL
  Filled 2022-06-11: qty 1

## 2022-06-11 MED ORDER — BUDESONIDE 0.25 MG/2ML IN SUSP
0.2500 mg | Freq: Two times a day (BID) | RESPIRATORY_TRACT | Status: DC
  Administered 2022-06-11 – 2022-06-12 (×2): 0.25 mg via RESPIRATORY_TRACT
  Filled 2022-06-11 (×2): qty 2

## 2022-06-11 MED ORDER — KETOROLAC TROMETHAMINE 30 MG/ML IJ SOLN
15.0000 mg | Freq: Once | INTRAMUSCULAR | Status: AC
  Administered 2022-06-11: 15 mg via INTRAVENOUS
  Filled 2022-06-11: qty 1

## 2022-06-11 MED ORDER — EZETIMIBE 10 MG PO TABS
10.0000 mg | ORAL_TABLET | Freq: Every day | ORAL | Status: DC
  Administered 2022-06-11 – 2022-06-12 (×2): 10 mg via ORAL
  Filled 2022-06-11 (×2): qty 1

## 2022-06-11 MED ORDER — BUDESONIDE 180 MCG/ACT IN AEPB: 1.0000 | INHALATION_SPRAY | Freq: Two times a day (BID) | RESPIRATORY_TRACT | Status: DC

## 2022-06-11 MED ORDER — HEPARIN SODIUM (PORCINE) 5000 UNIT/ML IJ SOLN
5000.0000 [IU] | Freq: Three times a day (TID) | INTRAMUSCULAR | Status: DC
  Administered 2022-06-11 – 2022-06-12 (×2): 5000 [IU] via SUBCUTANEOUS
  Filled 2022-06-11 (×2): qty 1

## 2022-06-11 MED ORDER — ATORVASTATIN CALCIUM 40 MG PO TABS
80.0000 mg | ORAL_TABLET | Freq: Every day | ORAL | Status: DC
  Administered 2022-06-11 – 2022-06-12 (×2): 80 mg via ORAL
  Filled 2022-06-11 (×2): qty 2

## 2022-06-11 MED ORDER — SENNOSIDES-DOCUSATE SODIUM 8.6-50 MG PO TABS: 1.0000 | ORAL_TABLET | Freq: Every evening | ORAL | Status: DC | PRN

## 2022-06-11 MED ORDER — ACETAMINOPHEN 160 MG/5ML PO SOLN: 650.0000 mg | ORAL | Status: DC | PRN

## 2022-06-11 MED ORDER — ASPIRIN 81 MG PO CHEW
324.0000 mg | CHEWABLE_TABLET | Freq: Once | ORAL | Status: AC
  Administered 2022-06-11: 324 mg via ORAL
  Filled 2022-06-11: qty 4

## 2022-06-11 MED ORDER — ONDANSETRON HCL 4 MG/2ML IJ SOLN: 4.0000 mg | Freq: Four times a day (QID) | INTRAMUSCULAR | Status: DC | PRN

## 2022-06-11 MED ORDER — STROKE: EARLY STAGES OF RECOVERY BOOK
Freq: Once | Status: AC
  Filled 2022-06-11: qty 1

## 2022-06-11 NOTE — ED Notes (Signed)
Patient transferred to hospital bed at this time

## 2022-06-11 NOTE — ED Notes (Signed)
Pt states no needs at this time.

## 2022-06-11 NOTE — Assessment & Plan Note (Signed)
-   Lipid panel pending -No PTA meds for this problem

## 2022-06-11 NOTE — ED Provider Notes (Signed)
[facility] Provider Note   CSN: [id number] Arrival date & time: 06/10/22  2345     History  Chief Complaint  Patient presents with   Numbness    Leslie Cardenas is a 82 y.o. male.  Patient presents to the emergency department for evaluation of strokelike symptoms.  Patient reports that he was driving when he had sudden onset of feeling like the left side of his face was drawing up and numbness of the left arm.  Patient reports that symptoms lasted for approximately an hour and now have completely resolved.  Does have a history of TIA.  He denies antiplatelet therapy.       Home Medications Prior to Admission medications   Medication Sig Start Date End Date Taking? Authorizing Provider  acetaminophen (TYLENOL) 325 MG tablet Take 325 mg by mouth every 6 (six) hours as needed.    [provider]  albuterol (VENTOLIN HFA) 108 (90 Base) MCG/ACT inhaler Inhale 2 puffs into the lungs every 4 (four) hours as needed for wheezing or shortness of breath. Make sure you rinse mouth out after each use. 08/29/19   Garnet Sierras, NP  Budeson-Glycopyrrol-Formoterol (BREZTRI AEROSPHERE) 160-9-4.8 MCG/ACT AERO Inhale into the lungs.    [provider]  ezetimibe (ZETIA) 10 MG tablet Take 1 tablet Daily for Cholesterol 01/01/22   Unk Pinto, MD  fluticasone (FLONASE) 50 MCG/ACT nasal spray SPRAY 1 SPRAY INTO BOTH NOSTRILS DAILY.    [provider]  guaiFENesin (MUCINEX) 600 MG 12 hr tablet Take 1 tablet (600 mg total) by mouth 2 (two) times daily. 03/19/22   Maryjane Hurter, MD  loratadine (CLARITIN) 10 MG tablet Take 10 mg by mouth daily.    [provider]  Multiple Vitamin (MULTIVITAMIN) tablet Take 1 tablet by mouth daily.    [provider]  nitroGLYCERIN (NITROSTAT) 0.4 MG SL tablet 1 TAB UNDER TONGUE EVERY 5 MIN UP TO 3 DOSES AS NEEDED FOR CHEST PAIN    [provider]  predniSONE (DELTASONE) 5 MG tablet Take 1  tablet daily as directed for COPD 04/28/22   Alycia Rossetti, NP  Respiratory Therapy Supplies (NEBULIZER/TUBING/MOUTHPIECE) KIT Disp one nebulizer machine, tubing set and mouthpiece kit for use 4 times daily 07/18/20   Unk Pinto, MD  Spacer/Aero-Holding Chambers DEVI 2 puffs by Does not apply route 2 (two) times daily. Please use with MDI inhalers 07/04/21   Maryjane Hurter, MD  VITAMIN D PO Take 5,000 Units by mouth daily.    [provider]      Allergies    Patient has no known allergies.    Review of Systems   Review of Systems  Physical Exam Updated Vital Signs BP (!) 152/84   Pulse (!) 50   Temp 97.7 F (36.5 C) (Oral)   Resp 18   Ht _0  (1.778 m)   Wt 59 kg   SpO2 98%   BMI 18.65 kg/m  Physical Exam Vitals and nursing note reviewed.  Constitutional:      General: He is not in acute distress.    Appearance: He is well-developed.  HENT:     Head: Normocephalic and atraumatic.     Mouth/Throat:     Mouth: Mucous membranes are moist.  Eyes:     General: Vision grossly intact. Gaze aligned appropriately.     Extraocular Movements: Extraocular movements intact.     Conjunctiva/sclera: Conjunctivae normal.  Cardiovascular:     Rate and Rhythm:  Normal rate and regular rhythm.     Pulses: Normal pulses.     Heart sounds: Normal heart sounds, S1 normal and S2 normal. No murmur heard.    No friction rub. No gallop.  Pulmonary:     Effort: Pulmonary effort is normal. No respiratory distress.     Breath sounds: Normal breath sounds.  Abdominal:     Palpations: Abdomen is soft.     Tenderness: There is no abdominal tenderness. There is no guarding or rebound.     Hernia: No hernia is present.  Musculoskeletal:        General: No swelling.     Cervical back: Full passive range of motion without pain, normal range of motion and neck supple. No pain with movement, spinous process tenderness or muscular tenderness. Normal range of motion.     Right  lower leg: No edema.     Left lower leg: No edema.  Skin:    General: Skin is warm and dry.     Capillary Refill: Capillary refill takes less than 2 seconds.     Findings: No ecchymosis, erythema, lesion or wound.  Neurological:     Mental Status: He is alert and oriented to person, place, and time.     GCS: GCS eye subscore is 4. GCS verbal subscore is 5. GCS motor subscore is 6.     Cranial Nerves: Cranial nerves 2-12 are intact.     Sensory: Sensation is intact.     Motor: Motor function is intact. No weakness or abnormal muscle tone.     Coordination: Coordination is intact.  Psychiatric:        Mood and Affect: Mood normal.        Speech: Speech normal.        Behavior: Behavior normal.     ED Results / Procedures / Treatments   Labs (all labs ordered are listed, but only abnormal results are displayed) Labs Reviewed  CBC - Abnormal; Notable for the following components:      Result Value   RBC 4.16 (*)    All other components within normal limits  URINALYSIS, ROUTINE W REFLEX MICROSCOPIC - Abnormal; Notable for the following components:   Hgb urine dipstick SMALL (*)    All other components within normal limits  ETHANOL  PROTIME-INR  APTT  DIFFERENTIAL  COMPREHENSIVE METABOLIC PANEL  RAPID URINE DRUG SCREEN, HOSP PERFORMED    EKG None  Radiology CT HEAD WO CONTRAST  Result Date: 06/11/2022 CLINICAL DATA:  Left-sided facial droop. EXAM: CT HEAD WITHOUT CONTRAST TECHNIQUE: Contiguous axial images were obtained from the base of the skull through the vertex without intravenous contrast. RADIATION DOSE REDUCTION: This exam was performed according to the departmental dose-optimization program which includes automated exposure control, adjustment of the mA and/or kV according to patient size and/or use of iterative reconstruction technique. COMPARISON:  December 21, 2020 FINDINGS: Brain: There is mild to moderate severity cerebral atrophy with widening of the extra-axial spaces  and ventricular dilatation. There are areas of decreased attenuation within the white matter tracts of the supratentorial brain, consistent with microvascular disease changes. 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 Vascular: No hyperdense vessel or unexpected calcification. Skull: Normal. Negative for fracture or focal lesion. Sinuses/Orbits: No acute finding. Other: None. IMPRESSION: 1. No acute intracranial abnormality. 2. 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 Electronically Signed   By: Virgina Norfolk M.D.   On: 06/11/2022 00:37  Procedures Procedures    Medications Ordered in ED Medications - No data to display  ED Course/ Medical Decision Making/ A&P     ABCD2 Score: 6                     Medical Decision Making Amount and/or Complexity of Data Reviewed External Data Reviewed: labs, radiology, ECG and notes. Labs: ordered. Decision-making details documented in ED Course. Radiology: ordered and independent interpretation performed. Decision-making details documented in ED Course. ECG/medicine tests: ordered and independent interpretation performed. Decision-making details documented in ED Course.   Patient presents to the ER for evaluation of what sounds like a classic TIA.  Patient had numbness, drooping of the left side of his face with left upper arm involvement.  Symptoms lasted approximately an hour and then resolved.  Patient has an ABCD 2 score of 6.  His symptoms are completely resolved at time of my evaluation.  Therefore no code stroke, no consideration for thrombolytics.    CT of head is unremarkable.  Patient does report a prior TIA.  Reviewing the records reveals that this was in July 2022.  His workup at that time included carotid and echo.  He has declined antiplatelet therapy.  Teleneuro evaluation appreciated.  Recommendation will be for inpatient workup including carotid studies, echo,  MRI.        Final Clinical Impression(s) / ED Diagnoses Final diagnoses:  TIA (transient ischemic attack)    Rx / DC Orders ED Discharge Orders     None         Jaidon Ellery, Gwenyth Allegra, MD 06/11/22 [phone]

## 2022-06-11 NOTE — Assessment & Plan Note (Signed)
-   With left face and left upper extremity deficits - Completely back to baseline - CT head shows no acute changes - MRI brain in the a.m. - Echo in the a.m. - Lipid panel in the a.m., TSH in the a.m. - Aspirin 81 mg daily - ST/OT/PT eval and treat  -Continue to monitor

## 2022-06-11 NOTE — Assessment & Plan Note (Signed)
-   Continue budesonide - No clinical signs of exacerbation at this time

## 2022-06-11 NOTE — Consult Note (Signed)
[facility] TeleNeurology Consult Services  Stat Consult  Patient Name:   Leslie Cardenas, Leslie Cardenas Date of Birth:   02/05/1941 Identification Number:   MRN - [id number] Date of Service:   06/11/2022 01:44:31  Diagnosis:       G45.9 - Transient cerebral ischemic attack, unspecified  Impression Patient presented with transient left sided weakness. Head CT shows no acute process. No thrombolytics due to lack of disabling symptoms and outside of treatment time window. Would recommend ASA for secondary prevention. Would benefit from inpatient evaluation for stroke.   Recommendations: Our recommendations are outlined below.  Diagnostic Studies : MRI head without contrast Please order  Laboratory Studies : Lipid panel Please order Hemoglobin A1c Please order TSH Please order  Antithrombotic Medication : Aspirin 81 mg PO daily Please order Statins for LDL goal less than 70 Please order  Nursing Recommendations : IV Fluids, avoid dextrose containing fluids, Maintain euglycemiaNeuro checks q4 hrs x 24 hrs and then per shiftHead of bed 30 degreesContinue with Telemetry  Consultations : Recommend Speech therapy if failed dysphagia screenPhysical therapy/Occupational therapy  DVT Prophylaxis : SCDs, Pneumatic CompressionLovenox or LMW Heparin   ----------------------------------------------------------------------------------------------------    Metrics: [facility] Notification Time: 06/11/2022 01:42:15 Stamp Time: 06/11/2022 01:44:31 Callback Response Time: 06/11/2022 01:44:55  Primary Provider Notified of Diagnostic Impression and Management Plan on: 06/11/2022 02:01:53   CT HEAD: As Per Radiologist CT Head Showed No Acute Hemorrhage or Acute Core Infarct    ----------------------------------------------------------------------------------------------------  Chief Complaint: Left facial droop.  History of Present Illness: Patient is a 82 year old  Male. Past Medical history of TIA, CAD, COPD, Hypertension presents to the ED with sudden onset of left facial droop and numbness to left arm starting at 1800 lasting one hour and resolved.    Past Medical History:      Hypertension      Hyperlipidemia      Stroke  Medications:  No Anticoagulant use  No Antiplatelet use Reviewed EMR for current medications  Allergies:  Reviewed  Social History: Smoking: No  Family History:  There is no family history of premature cerebrovascular disease pertinent to this consultation  ROS : 14 Points Review of Systems was performed and was negative except mentioned in HPI.  Past Surgical History: There Is No Surgical History Contributory To Today's Visit    Examination: BP(152/84), Pulse(50), 1A: Level of Consciousness - Alert; keenly responsive + 0 1B: Ask Month and Age - Both Questions Right + 0 1C: Blink Eyes & Squeeze Hands - Performs Both Tasks + 0 2: Test Horizontal Extraocular Movements - Normal + 0 3: Test Visual Fields - No Visual Loss + 0 4: Test Facial Palsy (Use Grimace if Obtunded) - Normal symmetry + 0 5A: Test Left Arm Motor Drift - No Drift for 10 Seconds + 0 5B: Test Right Arm Motor Drift - No Drift for 10 Seconds + 0 6A: Test Left Leg Motor Drift - No Drift for 5 Seconds + 0 6B: Test Right Leg Motor Drift - No Drift for 5 Seconds + 0 7: Test Limb Ataxia (FNF/Heel-Shin) - No Ataxia + 0 8: Test Sensation - Normal; No sensory loss + 0 9: Test Language/Aphasia - Normal; No aphasia + 0 10: Test Dysarthria - Normal + 0 11: Test Extinction/Inattention - No abnormality + 0  NIHSS Score: 0  Spoke with : Dr Betsey Holiday    Patient / Family was informed the Neurology Consult would occur via TeleHealth consult by way of interactive audio and video  telecommunications and consented to receiving care in this manner.  Patient is being evaluated for possible acute neurologic impairment and high probability of imminent or life -  threatening deterioration.I spent total of 30 minutes providing care to this patient, including time for face to face visit via telemedicine, review of medical records, imaging studies and discussion of findings with providers, the patient and / or family.   Dr Neldon Newport Mc-ONeil Leslie Cardenas   [facility] For Inpatient follow-up with [facility] physician please call [facility] [phone]. This is not an outpatient service. Post hospital discharge, please contact hospital directly.  Please do not communicate with [facility] physicians via secure chat. If you have any questions, Please contact [facility].

## 2022-06-11 NOTE — Consult Note (Addendum)
I connected with  Leslie Cardenas on 06/11/22 by a video enabled telemedicine application and verified that I am speaking with the correct person using two identifiers.   I discussed the limitations of evaluation and management by telemedicine. The patient expressed understanding and agreed to proceed.  Location of patient: [facility] Additional physician: [facility]  Neurology Consultation Reason for Consult: stroke Referring Physician: Dr Carlynn Purl  CC: Left-sided numbness  History is obtained from: Patient, chart review  HPI: Leslie Cardenas is a 82 y.o. male with past medical history of hypertension, coronary artery disease, TIA who presented with sudden onset of left facial droop and numbness to left arm.  Patient states yesterday around 6 PM he was driving and did not feel well.  He then noticed that he had left-sided facial droop and left-sided weakness/numbness which gradually resolved over the next few hours.  States he did have a headache described as bitemporal prior to the onset of symptoms.  States headache has persisted.  Denies any vision changes.  Of note, patient will presented in July 2022 with similar symptoms but also had blurred vision and headache at that time.  He was started on aspirin 81 mg daily but there was also concern that this could be migraine with aura.  Denies any headaches other than these 2 episodes.  States usually his blood pressure is normal.  Reports taking aspirin 81 mg daily.  Last known normal: 06/10/2022 at 1800 Event happened in the car No tPA as outside window No thrombectomy as no large vessel occlusion mRS 0  ROS: All other systems reviewed and negative except as noted in the HPI.   Past Medical History:  Diagnosis Date   Aortic atherosclerosis ([place]) 03/04/2018   Noted on CT Abd/pelvis   Cataract    Bilateral   Colon polyp    COPD (chronic obstructive pulmonary disease) ([place])    Coronary artery disease     Diverticulosis 07/16/2016   SIGMOID, NOTED ON COLONOSCOPY   Esophageal stenosis 02/24/2018   noted on endoscopy   Esophageal stricture    GERD (gastroesophageal reflux disease)    Grade I diastolic dysfunction 31/51/7616   noted on ECHO   History of hiatal hernia 02/24/2018   Small noted on endoscopy   Hydronephrosis    Hydronephrosis of right kidney 03/04/2018   URETERAL STONE 7 MM, NOTED ONJ CT ABD/PELVIS   Hyperlipidemia    Myocardial infarction [facility])    [id number]   Nephrolithiasis    s/p stent placement   Skin cancer    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)    Ureteral stone 03/04/2018   URETERAL STONE 7 MM. NOTED ON CT ABD/PELVIS   Vitamin D deficiency    Wears dentures    upper    Family History  Problem Relation Age of Onset   Hypertension Father    Pancreatic cancer Mother    Cancer Mother    Colon cancer Brother    Leukemia Brother    Prostate cancer Brother    Other Brother        polio   Esophageal cancer Neg Hx    Rectal cancer Neg Hx    Stomach cancer Neg Hx     Social History:  reports that he quit smoking about 22 years ago. His smoking use included cigarettes. He has a 60.00 pack-year smoking history. He has never used smokeless tobacco. He reports that he does not currently use alcohol. He reports that  he does not use drugs.  Exam: Current vital signs: BP (!) 149/92   Pulse 60   Temp 97.8 F (36.6 C) (Oral)   Resp 15   Ht '5\' 10"'$  (1.778 m)   Wt 59 kg   SpO2 97%   BMI 18.65 kg/m  Vital signs in last 24 hours: Temp:  [97.4 F (36.3 C)-98.4 F (36.9 C)] 97.8 F (36.6 C) (01/04 1217) Pulse Rate:  [40-60] 60 (01/04 1017) Resp:  [15-20] 15 (01/04 1017) BP: (138-183)/(66-92) 149/92 (01/04 1017) SpO2:  [95 %-98 %] 97 % (01/04 1017) Weight:  [59 kg] 59 kg (01/04 0013)   Physical Exam  Constitutional: Appears well-developed and well-nourished.  Psych: Affect appropriate to situation Eyes: No scleral injection Neuro: AOx3, no aphasia,  cranial nerves II to XII grossly intact, antigravity strength in upper extremities without drift, FTN intact bilaterally, sensation intact to light touch  I have reviewed labs in epic and the results pertinent to this consultation are: CBC:  Recent Labs  Lab 06/11/22 0006  WBC 6.8  NEUTROABS 4.1  HGB 13.7  HCT 41.3  MCV 99.3  PLT 409    Basic Metabolic Panel:  Lab Results  Component Value Date   NA 136 06/11/2022   K 3.7 06/11/2022   CO2 26 06/11/2022   GLUCOSE 90 06/11/2022   BUN 17 06/11/2022   CREATININE 1.19 06/11/2022   CALCIUM 9.1 06/11/2022   GFRNONAA >60 06/11/2022   GFRAA 72 12/05/2020   Lipid Panel:  Lab Results  Component Value Date   LDLCALC 143 (H) 06/11/2022   HgbA1c:  Lab Results  Component Value Date   HGBA1C 6.1 (H) 04/28/2022   Urine Drug Screen:     Component Value Date/Time   LABOPIA NONE DETECTED 06/11/2022 0121   COCAINSCRNUR NONE DETECTED 06/11/2022 0121   LABBENZ NONE DETECTED 06/11/2022 0121   AMPHETMU NONE DETECTED 06/11/2022 0121   THCU NONE DETECTED 06/11/2022 0121   LABBARB NONE DETECTED 06/11/2022 0121    Alcohol Level     Component Value Date/Time   ETH <10 06/11/2022 0006     I have reviewed the images obtained:  CT head without contrast 06/11/2022: No acute intracranial abnormality. 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  MRI brain without contrast 06/11/2022: No acute intracranial abnormality. Inferior left frontal lobe encephalomalacia.   ASSESSMENT/PLAN: 82 year old male with multiple comorbidities as noted above who presented with transient left facial droop and numbness of left arm which is since resolved.  TIA -Etiology: Small vessel disease versus embolic -Symptoms concerning for TIA versus migraine with aura.  Patient did have elevated blood pressure (per son it was 811 systolic when EMS checked it) and symptoms lasted for few hours.  Also patient does not have headaches  commonly and prior CTAs did show narrowing of  bilateral M1/M2  Therefore more likely a TIA then migraine with aura  Recommendations: -Will obtain CTA head and neck to look for intracranial stenosis -If there is intracranial stenosis, recommend aspirin 81 mg daily and Plavix 75 mg daily for 3 months followed by Plavix 75 mg daily -If intracranial stenosis has resolved, recommend switching to Plavix 75 mg daily -Recommend atorvastatin 40 mg daily -TTE ordered and pending.  If within normal limits recommend 30-day event monitor  -Recommend modification of stroke risk factors -Stroke education -PT/OT -Follow-up with neurology in 3 months Plan discussed with patient and son at bedside as well as Dr. Dwyane Dee via secure chat   Thank  you for allowing [place] to participate in the care of this patient. If you have any further questions, please contact  me or neurohospitalist.   Zeb Comfort [facility] neuro[facility]ist

## 2022-06-11 NOTE — Progress Notes (Signed)
*  PRELIMINARY RESULTS* Echocardiogram 2D Echocardiogram has been performed.  Leslie Cardenas 06/11/2022, 2:54 PM

## 2022-06-11 NOTE — TOC Initial Note (Signed)
Transition of Care [facility]) - Initial/Assessment Note    Patient Details  Name: Leslie Cardenas MRN: [id number] Date of Birth: September 08, 1940  [facility]) CM/SW Contact:    Iona Beard, [place] Phone Number: 06/11/2022, 4:07 PM  Clinical Narrative:                 CSW noted PT recommends HH at D/C for pt. CSW spoke with pt about recommendation. Pt states he does not feel this is needed and would like to continue to see how he does. CSW explained if he discharges home without the hospital setting it up and he changes his mind he can reach out to his PCP. Pt is understanding. TOC to follow.   Expected Discharge Plan: Home/Self Care Barriers to Discharge: Barriers Resolved   Patient Goals and CMS Choice Patient states their goals for this hospitalization and ongoing recovery are:: return home CMS Medicare.gov Compare Post Acute Care list provided to:: Patient Choice offered to / list presented to : Patient      Expected Discharge Plan and Services In-house Referral: Clinical Social Work Discharge Planning Services: CM Consult   Living arrangements for the past 2 months: Single Family Home                                      Prior Living Arrangements/Services Living arrangements for the past 2 months: Single Family Home Lives with:: Self Patient language and need for interpreter reviewed:: Yes Do you feel safe going back to the place where you live?: Yes      Need for Family Participation in Patient Care: Yes (Comment) Care giver support system in place?: Yes (comment)   Criminal Activity/Legal Involvement Pertinent to Current Situation/Hospitalization: No - Comment as needed  Activities of Daily Living      Permission Sought/Granted                  Emotional Assessment Appearance:: Appears stated age Attitude/Demeanor/Rapport: Engaged Affect (typically observed): Accepting Orientation: : Oriented to Self, Oriented to Place, Oriented to  Time,  Oriented to Situation Alcohol / Substance Use: Not Applicable Psych Involvement: No (comment)  Admission diagnosis:  TIA (transient ischemic attack) [G45.9] Patient Active Problem List   Diagnosis Date Noted   Blurred vision 12/21/2020   TIA (transient ischemic attack) 12/21/2020   Thoracic aortic atherosclerosis ([place]) by CXR on 07/29/2018 06/05/2020   FH: hypertension 04/27/2018   CKD (chronic kidney disease) stage 3, GFR 30-59 ml/min (HCC) 04/27/2018   Abnormal glucose 04/27/2018   Nephrolithiasis 03/04/2018   Hydronephrosis 03/04/2018   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   S/P unilateral BKA (below knee amputation), right ([place]) 09/22/2017   GERD 02/15/2015   Medicare annual wellness visit, initial 02/15/2015   ASHD hx/o MI 07/25/2014   Hx of right BKA ([place]) 01/15/2014   Essential hypertension 07/05/2013   Prediabetes 07/05/2013   Vitamin D deficiency 07/05/2013   Medication management 07/05/2013   History of colonic polyps 03/30/2012   Hyperlipidemia, mixed 11/13/2011   COPD (chronic obstructive pulmonary disease) ([place]) 11/13/2011   Sinus bradycardia 11/13/2011   PCP:  Unk Pinto, MD Pharmacy:   [id number]: [phone]: [phone] [id number] [place]  [place] [address] Phone: [phone] Fax: [place], [place] [address] Phone: [phone] Fax: [phone]     Social Determinants of Health (SDOH) Social History: SDOH Screenings   Alcohol Screen: Low Risk  (02/17/2018)  Depression (PHQ2-9): Low Risk  (04/28/2022)  Tobacco Use: Medium Risk (04/28/2022)   SDOH Interventions:     Readmission Risk Interventions     No data to display

## 2022-06-11 NOTE — ED Triage Notes (Signed)
Pt hx of TIA earlier this year, at 1800 today driving pt had left side fcial droop, numbness and tingling in right arm, became forgetful. Symptoms resolved by 2000. Pt currently A&O x4 and NIH is 0.

## 2022-06-11 NOTE — Care Plan (Signed)
This 82 years old male with PMH significant for COPD, coronary artery disease, GERD, grade 1 diastolic dysfunction, hyperlipidemia presented to the ED with chief complaints of left-sided numbness. Patient reports his symptoms lasted about 90 minutes,  they are completely resolved by the time patient arrived in the ED.  Patient was evaluated by teleneurology recommended stroke workup.  MRI negative for acute stroke. LDL 143,  Started on Lipitor 80 mg daily,  target LDL goal below 70.  Echocardiogram is pending.  Continued on aspirin and Lipitor.  Neurology is consulted.  Patient was seen and examined at bedside,  Patient was eating lunch , states he is doing better,  symptoms resolved.

## 2022-06-11 NOTE — H&P (Signed)
History and Physical    Patient: Leslie Cardenas LFY:[id number] DOB: 12-Aug-1940 DOA: 06/10/2022 DOS: the patient was seen and examined on 06/11/2022 PCP: Unk Pinto, MD  Patient coming from: Home  Chief Complaint:  Chief Complaint  Patient presents with   Numbness   HPI: Leslie Cardenas is a 82 y.o. male with medical history significant of COPD, coronary artery disease, GERD, grade 1 diastolic dysfunction, hyperlipidemia, and more presents to the ED with a chief complaint of left-sided numbness.  Patient reports that it started with his lips drooping and feeling numb.  Then he noticed his left upper extremity was feeling numb.  He reports his symptoms lasted about 90 minutes.  They are completely gone now.  He reports he had associated left shoulder pain as well and a cramp in his neck.  Patient reports only tried to talk to his family members on the phone he had dysarthria.  He denies ataxia but reports it would be hard to tell since he only has 1 real leg.  Patient reports dysphagia, but that is not particularly new.  He reports he has had that issue before.  He felt like there was a cramp in his neck that was causing his dysphagia.  Patient reports no particular left leg numbness or weakness.  All the symptoms have resolved.  Patient reports that during that time that he was having the symptoms he did have a pressure-like headache behind his eyes.  He has no other complaints at this time.  Patient does not smoke, does not drink, is vaccinated for COVID.  Patient is DNR. Review of Systems: As mentioned in the history of present illness. All other systems reviewed and are negative. Past Medical History:  Diagnosis Date   Aortic atherosclerosis ([place]) 03/04/2018   Noted on CT Abd/pelvis   Cataract    Bilateral   Colon polyp    COPD (chronic obstructive pulmonary disease) ([place])    Coronary artery disease    Diverticulosis 07/16/2016   SIGMOID, NOTED ON COLONOSCOPY   Esophageal stenosis  02/24/2018   noted on endoscopy   Esophageal stricture    GERD (gastroesophageal reflux disease)    Grade I diastolic dysfunction 85/27/7824   noted on ECHO   History of hiatal hernia 02/24/2018   Small noted on endoscopy   Hydronephrosis    Hydronephrosis of right kidney 03/04/2018   URETERAL STONE 7 MM, NOTED ONJ CT ABD/PELVIS   Hyperlipidemia    Myocardial infarction [facility])    [id number]   Nephrolithiasis    s/p stent placement   Skin cancer    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 NOTED ON CT ABD/PELVIS   Vitamin D deficiency    Wears dentures    upper   Past Surgical History:  Procedure Laterality Date   CARDIAC CATHETERIZATION  1983   No intervention performed.    CATARACT EXTRACTION, BILATERAL     COLONOSCOPY     COLONOSCOPY W/ POLYPECTOMY  08/12/2010   Dr. Silvano Rusk   COLONOSCOPY W/ POLYPECTOMY  07/16/2016   CYSTOSCOPY W/ URETERAL STENT PLACEMENT Right 03/04/2018   Procedure: CYSTOSCOPY WITH RETROGRADE PYELOGRAM/URETERAL STENT PLACEMENT;  Surgeon: [name], MD;  Location: [place];  Service: Urology;  Laterality: Right;   CYSTOSCOPY/URETEROSCOPY/HOLMIUM LASER/STENT PLACEMENT Right 03/16/2018   Procedure: CYSTOSCOPY RIGHT URETEROSCOPY/HOLMIUM LASER/STENT PLACEMENT;  Surgeon: [name], MD;  Location: [facility];  Service: Urology;  Laterality: Right;   ESOPHAGOGASTRODUODENOSCOPY     INGUINAL HERNIA REPAIR     left   right leg amputation  05/14/1977   SHOULDER ARTHROSCOPY Left 2000   UPPER GASTROINTESTINAL ENDOSCOPY  WITH DILATION  02/24/2018   Social History:  reports that he quit smoking about 22 years ago. His smoking use included cigarettes. He has a 60.00 pack-year smoking history. He has never used smokeless tobacco. He reports that he does not currently use alcohol. He reports that he does not use drugs.  No Known Allergies  Family History  Problem Relation Age of Onset    Hypertension Father    Pancreatic cancer Mother    Cancer Mother    Colon cancer Brother    Leukemia Brother    Prostate cancer Brother    Other Brother        polio   Esophageal cancer Neg Hx    Rectal cancer Neg Hx    Stomach cancer Neg Hx     Prior to Admission medications   Medication Sig Start Date End Date Taking? Authorizing Provider  acetaminophen (TYLENOL) 325 MG tablet Take 325 mg by mouth every 6 (six) hours as needed.    [provider]  albuterol (VENTOLIN HFA) 108 (90 Base) MCG/ACT inhaler Inhale 2 puffs into the lungs every 4 (four) hours as needed for wheezing or shortness of breath. Make sure you rinse mouth out after each use. 08/29/19   Garnet Sierras, NP  Budeson-Glycopyrrol-Formoterol (BREZTRI AEROSPHERE) 160-9-4.8 MCG/ACT AERO Inhale into the lungs.    [provider]  ezetimibe (ZETIA) 10 MG tablet Take 1 tablet Daily for Cholesterol 01/01/22   Unk Pinto, MD  fluticasone (FLONASE) 50 MCG/ACT nasal spray SPRAY 1 SPRAY INTO BOTH NOSTRILS DAILY.    [provider]  guaiFENesin (MUCINEX) 600 MG 12 hr tablet Take 1 tablet (600 mg total) by mouth 2 (two) times daily. 03/19/22   Maryjane Hurter, MD  loratadine (CLARITIN) 10 MG tablet Take 10 mg by mouth daily.    [provider]  Multiple Vitamin (MULTIVITAMIN) tablet Take 1 tablet by mouth daily.    [provider]  nitroGLYCERIN (NITROSTAT) 0.4 MG SL tablet 1 TAB UNDER TONGUE EVERY 5 MIN UP TO 3 DOSES AS NEEDED FOR CHEST PAIN    [provider]  predniSONE (DELTASONE) 5 MG tablet Take 1 tablet daily as directed for COPD 04/28/22   Alycia Rossetti, NP  Respiratory Therapy Supplies (NEBULIZER/TUBING/MOUTHPIECE) KIT Disp one nebulizer machine, tubing set and mouthpiece kit for use 4 times daily 07/18/20   Unk Pinto, MD  Spacer/Aero-Holding Chambers DEVI 2 puffs by Does not apply route 2 (two) times daily. Please use with MDI inhalers 07/04/21   Maryjane Hurter, MD  VITAMIN D PO Take 5,000 Units by mouth daily.    [provider]    Physical Exam: Vitals:   06/11/22 0100 06/11/22 0130 06/11/22 0430 06/11/22 0500  BP: (!) 152/84 (!) 166/67 (!) 143/66 (!) 153/76  Pulse: (!) 50 (!) 43 (!) 40 (!) 49  Resp: _0 Temp: 97.7 F (36.5 C) 97.7 F (36.5 C)    TempSrc: Oral     SpO2: 98% 97% 96% 95%  Weight:      Height:       1.  General: Patient lying supine in bed,  no acute distress   2. Psychiatric: Alert and oriented x 3, mood and behavior normal for situation, pleasant and cooperative with  exam   3. Neurologic: Speech and language are normal, face is symmetric, moves all 4 extremities voluntarily, at baseline without acute deficits on limited exam   4. HEENMT:  Head is atraumatic, normocephalic, pupils reactive to light, neck is supple, trachea is midline, mucous membranes are moist   5. Respiratory : Lungs are clear to auscultation bilaterally without wheezing, rhonchi, rales, no cyanosis, no increase in work of breathing or accessory muscle use   6. Cardiovascular : Heart rate normal, rhythm is regular, no murmurs, rubs or gallops, no peripheral edema, peripheral pulses palpated   7. Gastrointestinal:  Abdomen is soft, nondistended, nontender to palpation bowel sounds active, no masses or organomegaly palpated   8. Skin:  Skin is warm, dry and intact without rashes, acute lesions, or ulcers on limited exam   9.Musculoskeletal:  No acute deformities or trauma, no asymmetry in tone, no peripheral edema, peripheral pulses palpated, no tenderness to palpation in the extremities  Data Reviewed: In the ED Temp 97.4-97.7, heart rate 43-52, respiratory rate 17-20, blood pressure 152/67-183/84 saturating at 98% No leukocytosis, hemoglobin stable Chemistry is unremarkable UA is unremarkable UDS is unremarkable CT head shows no acute abnormality EKG shows a heart rate of 48, sinus bradycardia, QTc  414 Looks like patient also had strokelike symptoms in July 2022 at that time he declined antiplatelet therapy Neuro recommends MRI and labs Admission requested for further management of TIA  Assessment and Plan: * TIA (transient ischemic attack) - With left face and left upper extremity deficits - Completely back to baseline - CT head shows no acute changes - MRI brain in the a.m. - Echo in the a.m. - Lipid panel in the a.m., TSH in the a.m. - Aspirin 81 mg daily - ST/OT/PT eval and treat  -Continue to monitor  Essential hypertension - Prior to arrival medications listed for hypertension - BP in the ED as high as 183/84 - Permissive hypertension until neurology sees patient  COPD (chronic obstructive pulmonary disease) (HCC) - Continue budesonide - No clinical signs of exacerbation at this time  Hyperlipidemia, mixed - Lipid panel pending -No PTA meds for this problem      Advance Care Planning:   Code Status: DNR   Consults: Neurology  Family Communication: Son at bedside  Severity of Illness: The appropriate patient status for this patient is OBSERVATION. Observation status is judged to be reasonable and necessary in order to provide the required intensity of service to ensure the patient's safety. The patient's presenting symptoms, physical exam findings, and initial radiographic and laboratory data in the context of their medical condition is felt to place them at decreased risk for further clinical deterioration. Furthermore, it is anticipated that the patient will be medically stable for discharge from the hospital within 2 midnights of admission.   Author: Rolla Plate, DO 06/11/2022 6:45 AM  For on call review [email or web address].

## 2022-06-11 NOTE — ED Notes (Signed)
Pt transported to CT ?

## 2022-06-11 NOTE — Progress Notes (Signed)
  Transition of Care [facility]) Screening Note   Patient Details  Name: HAMLIN DEVINE Date of Birth: 11/27/1940   Transition of Care [facility]) CM/SW Contact:    Iona Beard, [place] Phone Number: 06/11/2022, 10:22 AM    Transition of Care Department [facility]) has reviewed patient and no TOC needs have been identified at this time. We will continue to monitor patient advancement through interdisciplinary progression rounds. If new patient transition needs arise, please place a TOC consult.

## 2022-06-11 NOTE — ED Notes (Signed)
Patient is resting comfortably. 

## 2022-06-11 NOTE — Assessment & Plan Note (Signed)
-   Prior to arrival medications listed for hypertension - BP in the ED as high as 183/84 - Permissive hypertension until neurology sees patient

## 2022-06-11 NOTE — Evaluation (Signed)
Physical Therapy Evaluation Patient Details Name: Leslie Cardenas MRN: [id number] DOB: 1940/10/06 Today's Date: 06/11/2022  History of Present Illness  Pt to ER with complaint of numbness which he states is gone at this time.  Clinical Impression  Pt mod I at home.  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  Pt states that he has been unsteady, especially going out to the car lately.  Pt will benefit from a RW as well as a HH referral for evaluation of home for safety.       Recommendations for follow up therapy are one component of a multi-disciplinary discharge planning process, led by the attending physician.  Recommendations may be updated based on patient status, additional functional criteria and insurance authorization.  Follow Up Recommendations Home health PT      Assistance Recommended at Discharge Set up Supervision/Assistance  Patient can return home with the following  A little help with walking and/or transfers    Equipment Recommendations Rolling walker (2 wheels)  Recommendations for Other Services   none   Functional Status Assessment   Pt is mod I in the hospital setting but will benefit from [facility] PT evaluation of the home and RW>     Precautions / Restrictions Precautions Precautions: None Restrictions Weight Bearing Restrictions: No      Mobility  Bed Mobility Overal bed mobility: Modified Independent                  Transfers Overall transfer level: Modified independent                      Ambulation/Gait Ambulation/Gait assistance: Modified independent (Device/Increase time) Gait Distance (Feet): 226 Feet Assistive device: None Gait Pattern/deviations: Step-through pattern                Pertinent Vitals/Pain Pain Assessment Pain Assessment: 0-10 Pain Score: 2  Pain Location: headache Pain Intervention(s): RN gave pain meds during session    Home Living Family/patient expects to be discharged to:: Private residence Living  Arrangements: Alone Available Help at Discharge: Family Type of Home: House Home Access: Stairs to enter   Technical brewer of Steps: 4   Home Layout: One level Home Equipment: Cane - single point      Prior Function Prior Level of Function : Independent/Modified Independent             Mobility Comments: mod I ADLs Comments: mod I        Extremity/Trunk Assessment        Lower Extremity Assessment Lower Extremity Assessment: Defer to PT evaluation       Communication   Communication: No difficulties  Cognition Arousal/Alertness: Awake/alert                                                 Exercises General Exercises - Lower Extremity Hip ABduction/ADduction:  (side step x10) Mini-Sqauts: 10 reps   Assessment/Plan    PT Assessment All further PT needs can be met in the next venue of care         PT Goals (Current goals can be found in the Care Plan section)  Acute Rehab PT Goals Patient Stated Goal: to go home Time For Goal Achievement: 06/12/22            AM-PAC PT "6 Clicks" Mobility  Outcome Measure Help needed turning from your back to your side while in a flat bed without using bedrails?: None Help needed moving from lying on your back to sitting on the side of a flat bed without using bedrails?: None Help needed moving to and from a bed to a chair (including a wheelchair)?: None Help needed standing up from a chair using your arms (e.g., wheelchair or bedside chair)?: None Help needed to walk in hospital room?: A Little Help needed climbing 3-5 steps with a railing? : A Lot 6 Click Score: 21    End of Session   Activity Tolerance: Patient tolerated treatment well Patient left: in bed   PT Visit Diagnosis: Unsteadiness on feet (R26.81)    Time: 2800-3491 PT Time Calculation (min) (ACUTE ONLY): 24 min   Charges:   PT Evaluation $PT Eval Low Complexity: [place], PT  CLT [phone]  06/11/2022, 3:15 PM

## 2022-06-12 ENCOUNTER — Telehealth: Payer: Self-pay | Admitting: *Deleted

## 2022-06-12 DIAGNOSIS — G459 Transient cerebral ischemic attack, unspecified: Secondary | ICD-10-CM | POA: Diagnosis not present

## 2022-06-12 LAB — HEMOGLOBIN A1C
Hgb A1c MFr Bld: 6 % — ABNORMAL HIGH (ref 4.8–5.6)
Mean Plasma Glucose: 126 mg/dL

## 2022-06-12 MED ORDER — CLOPIDOGREL BISULFATE 75 MG PO TABS: 75.0000 mg | ORAL_TABLET | Freq: Every day | ORAL | Status: DC

## 2022-06-12 MED ORDER — ASPIRIN 81 MG PO TBEC: 81.0000 mg | DELAYED_RELEASE_TABLET | Freq: Every day | ORAL | 0 refills | Status: DC

## 2022-06-12 MED ORDER — EZETIMIBE 10 MG PO TABS: 10.0000 mg | ORAL_TABLET | Freq: Every day | ORAL | 1 refills | Status: DC

## 2022-06-12 MED ORDER — ATORVASTATIN CALCIUM 80 MG PO TABS: 80.0000 mg | ORAL_TABLET | Freq: Every day | ORAL | 1 refills | Status: DC

## 2022-06-12 MED ORDER — ASPIRIN 81 MG PO TBEC: 81.0000 mg | DELAYED_RELEASE_TABLET | Freq: Every day | ORAL | 12 refills | Status: DC

## 2022-06-12 MED ORDER — CLOPIDOGREL BISULFATE 75 MG PO TABS: 75.0000 mg | ORAL_TABLET | Freq: Every day | ORAL | 2 refills | Status: DC

## 2022-06-12 NOTE — Evaluation (Addendum)
Occupational Therapy Evaluation Patient Details Name: Leslie Cardenas MRN: [id number] DOB: 12/20/1940 Today's Date: 06/12/2022   History of Present Illness Leslie Cardenas is a 82 y.o. male with medical history significant of COPD, coronary artery disease, GERD, grade 1 diastolic dysfunction, hyperlipidemia, and more presents to the ED with a chief complaint of left-sided numbness.  Patient reports that it started with his lips drooping and feeling numb.  Then he noticed his left upper extremity was feeling numb.  He reports his symptoms lasted about 90 minutes.  They are completely gone now.  He reports he had associated left shoulder pain as well and a cramp in his neck.  Patient reports only tried to talk to his family members on the phone he had dysarthria.  He denies ataxia but reports it would be hard to tell since he only has 1 real leg.  Patient reports dysphagia, but that is not particularly new.  He reports he has had that issue before.  He felt like there was a cramp in his neck that was causing his dysphagia.  Patient reports no particular left leg numbness or weakness.  All the symptoms have resolved.  Patient reports that during that time that he was having the symptoms he did have a pressure-like headache behind his eyes.  He has no other complaints at this time. (per MD)   Clinical Impression   Pt reports feeling back to normal. Pt was able to dress L LE seated at EOB and transfer to the toilet without difficulty. Mild leaning on the wall. Pt appears to be near baseline levels with good B UE strength and coordination. Pt is not recommended for further acute OT services and will be discharged to care of nursing staff for remaining length of stay.       Recommendations for follow up therapy are one component of a multi-disciplinary discharge planning process, led by the attending physician.  Recommendations may be updated based on patient status, additional functional criteria and insurance  authorization.   Follow Up Recommendations  No OT follow up     Assistance Recommended at Discharge PRN        Functional Status Assessment  Patient has not had a recent decline in their functional status        Precautions / Restrictions Precautions Precautions: None Restrictions Weight Bearing Restrictions: No      Mobility Bed Mobility Overal bed mobility: Independent                  Transfers Overall transfer level: Independent                 General transfer comment: ambulated to toilet and back      Balance Overall balance assessment: Mild deficits observed, not formally tested (Some leaning on wall.)                                         ADL either performed or assessed with clinical judgement   ADL Overall ADL's : Independent                                             Vision Baseline Vision/History: 1 Wears glasses Ability to See in Adequate Light: 0 Adequate Patient Visual Report: No change  from baseline Vision Assessment?: No apparent visual deficits                Pertinent Vitals/Pain Pain Assessment Pain Assessment: No/denies pain     Hand Dominance Left   Extremity/Trunk Assessment Upper Extremity Assessment Upper Extremity Assessment: Overall WFL for tasks assessed   Lower Extremity Assessment Lower Extremity Assessment: Defer to PT evaluation   Cervical / Trunk Assessment Cervical / Trunk Assessment: Normal   Communication Communication Communication: No difficulties   Cognition Arousal/Alertness: Awake/alert Behavior During Therapy: WFL for tasks assessed/performed Overall Cognitive Status: Within Functional Limits for tasks assessed                                                        Home Living Family/patient expects to be discharged to:: Private residence Living Arrangements: Alone Available Help at Discharge: Family;Available  PRN/intermittently Type of Home: House Home Access: Stairs to enter [employer] of Steps: 4   Home Layout: One level     Bathroom Shower/Tub: Occupational psychologist: Standard     Home Equipment: Cane - single point; shower seat.           Prior Functioning/Environment Prior Level of Function : Independent/Modified Independent             Mobility Comments: Hydrographic surveyor; drives ADLs Comments: Independent         AM-PAC OT "6 Clicks" Daily Activity     Outcome Measure Help from another person eating meals?: None Help from another person taking care of personal grooming?: None Help from another person toileting, which includes using toliet, bedpan, or urinal?: None Help from another person bathing (including washing, rinsing, drying)?: None Help from another person to put on and taking off regular upper body clothing?: None Help from another person to put on and taking off regular lower body clothing?: None 6 Click Score: 24   End of Session    Activity Tolerance: Patient tolerated treatment well Patient left: in bed;with call bell/phone within reach;with family/visitor present  OT Visit Diagnosis: Other symptoms and signs involving the nervous system (V74.734)                Time: 0370-9643 OT Time Calculation (min): 10 min Charges:  OT General Charges $OT Visit: 1 Visit OT Evaluation $OT Eval Low Complexity: 1 Low  Amali Uhls OT, MOT  [employer] 06/12/2022, 9:08 AM

## 2022-06-12 NOTE — Discharge Instructions (Signed)
Advised to follow-up with primary care physician in 1 week. Advised to take aspirin and Plavix daily for 3 months followed by Plavix only therapy. Advised to follow-up with neurology in 3 months. Advised to take Lipitor 80 mg daily.  Goal LDL below 70

## 2022-06-12 NOTE — Progress Notes (Signed)
SLP Cancellation Note  Patient Details Name: Leslie Cardenas MRN: [id number] DOB: 1941-05-07   Cancelled treatment:       Reason Eval/Treat Not Completed: SLP screened, no needs identified, will sign off. Pt's cognition, speech and language are functioning at or near baseline, thank you for this referral.  Cidney Kirkwood H. Roddie Mc, CCC-SLP Speech Language Pathologist    Wende Bushy 06/12/2022, 9:28 AM

## 2022-06-12 NOTE — Telephone Encounter (Signed)
Request for 30 day monitor for TIA by Dr. Dwyane Dee. Patient enrolled in [employer] and order placed.

## 2022-06-12 NOTE — Discharge Summary (Signed)
Physician Discharge Summary  Leslie Cardenas YQM:[id number] DOB: [birth year] DOA: 06/10/2022  PCP: Unk Pinto, MD  Admit date: 06/10/2022  Discharge date: 06/12/2022  Admitted From: Home. Disposition:  Home Health Services.  Recommendations for Outpatient Follow-up:  Follow up with PCP in 1-2 weeks. Please obtain BMP/CBC in one week. Advised to take aspirin and Plavix daily for 3 months followed by Plavix only therapy. Advised to follow-up with Neurology in 3 months. Advised to take Lipitor 80 mg daily.  Goal LDL below 70  Home Health:Home PT Equipment/Devices:None  Discharge Condition: Stable CODE STATUS:Full code Diet recommendation: Heart Healthy  Brief [facility] Course: This 82 years old male with PMH significant for COPD, coronary artery disease, GERD, grade 1 diastolic dysfunction, hyperlipidemia presented to the ED with chief complaints of left-sided numbness. Patient reports his symptoms lasted about 90 minutes,  they are completely resolved by the time patient arrived in the ED.  Patient was evaluated by teleneurology recommended stroke workup.  MRI negative for acute stroke. LDL 143,  Started on Lipitor 80 mg daily,  target LDL goal below 70.  Echocardiogram is unremarkable.  Continued on aspirin and Lipitor.  Neurology is consulted.  CTA shows moderate to severe focal stenosis in the distal petrous/proximal cavernous segment of left MCA which has progressed compared to 2022.  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 Neurology has reviewed the imaging recommended patient can be discharged on aspirin and Plavix for 3 months followed by Plavix only therapy.  Patient should follow-up with neurology in 3 months.  Event monitor was prescribed at the time of discharge.  Patient is being discharged home.  His symptoms has resolved.   Discharge Diagnoses:  Principal Problem:   TIA (transient ischemic attack) Active Problems:    Hyperlipidemia, mixed   COPD (chronic obstructive pulmonary disease) ([place])   Essential hypertension  Discharge Instructions: Advised to take aspirin and Plavix daily for 3 months followed by Plavix only therapy. Advised to follow-up with Neurology in 3 months. Advised to take Lipitor 80 mg daily.  Goal LDL below 70    Discharge Instructions     Call MD for:  difficulty breathing, headache or visual disturbances   Complete by: As directed    Call MD for:  persistant dizziness or light-headedness   Complete by: As directed    Diet - low sodium heart healthy   Complete by: As directed    Diet Carb Modified   Complete by: As directed    Discharge instructions   Complete by: As directed    Advised to follow-up with primary care physician in 1 week. Advised to take aspirin and Plavix daily for 3 months followed by Plavix only therapy. Advised to follow-up with neurology in 3 months. Advised to take Lipitor 80 mg daily.  Goal LDL below 70   Increase activity slowly   Complete by: As directed       Allergies as of 06/12/2022   No Known Allergies      Medication List     STOP taking these medications    acetaminophen 325 MG tablet Commonly known as: TYLENOL       TAKE these medications    albuterol 108 (90 Base) MCG/ACT inhaler Commonly known as: Ventolin HFA Inhale 2 puffs into the lungs every 4 (four) hours as needed for wheezing or shortness of breath. Make sure you rinse mouth out after each use.   aspirin EC 81  MG tablet Take 1 tablet (81 mg total) by mouth daily. Swallow whole. Start taking on: June 13, 2022   atorvastatin 80 MG tablet Commonly known as: LIPITOR Take 1 tablet (80 mg total) by mouth daily. Start taking on: June 13, 2022   Breztri Aerosphere 160-9-4.8 MCG/ACT Aero Generic drug: Budeson-Glycopyrrol-Formoterol Inhale 1 puff into the lungs daily.   clopidogrel 75 MG tablet Commonly known as: Plavix Take 1 tablet (75 mg total) by mouth  daily.   ezetimibe 10 MG tablet Commonly known as: Zetia Take 1 tablet (10 mg total) by mouth daily. Start taking on: June 13, 2022   fluticasone 50 MCG/ACT nasal spray Commonly known as: FLONASE Place 1 spray into both nostrils daily.   guaiFENesin 600 MG 12 hr tablet Commonly known as: MUCINEX Take 1 tablet (600 mg total) by mouth 2 (two) times daily.   loratadine 10 MG tablet Commonly known as: CLARITIN Take 10 mg by mouth daily.   multivitamin tablet Take 1 tablet by mouth daily.   Nebulizer/Tubing/Mouthpiece Kit Disp one nebulizer machine, tubing set and mouthpiece kit for use 4 times daily   neomycin-polymyxin b-dexamethasone 3.5-10000-0.1 Oint Commonly known as: MAXITROL Place 1 Application into both eyes in the morning and at bedtime.   nitroGLYCERIN 0.4 MG SL tablet Commonly known as: NITROSTAT Place 0.4 mg under the tongue every 5 (five) minutes as needed for chest pain.   predniSONE 5 MG tablet Commonly known as: DELTASONE Take 1 tablet daily as directed for COPD What changed:  how much to take how to take this when to take this   Spacer/Aero-Holding Dorise Bullion 2 puffs by Does not apply route 2 (two) times daily. Please use with MDI inhalers   VITAMIN D PO Take 5,000 Units by mouth daily.         Follow-up Information     Unk Pinto, MD Follow up in 1 week(s).   Specialty: Internal Medicine Contact information: [address] [place] [address] [phone]                No Known Allergies  Consultations: Neurology   Procedures/Studies: US Carotid Bilateral  Result Date: 06/12/2022 CLINICAL DATA:  Left-sided weakness EXAM: BILATERAL CAROTID DUPLEX ULTRASOUND TECHNIQUE: Pearline Cables scale imaging, color Doppler and duplex ultrasound were performed of bilateral carotid and vertebral arteries in the neck. COMPARISON:  CTA head and neck 12/21/2020 FINDINGS: Criteria: Quantification of carotid stenosis is based  on velocity parameters that correlate the residual internal carotid diameter with NASCET-based stenosis levels, using the diameter of the distal internal carotid lumen as the denominator for stenosis measurement. The following velocity measurements were obtained: RIGHT ICA: 68/17 cm/sec CCA: 97/35 cm/sec SYSTOLIC ICA/CCA RATIO:  1.2 ECA:  78 cm/sec LEFT ICA: 96/25 cm/sec CCA: 32/99 cm/sec SYSTOLIC ICA/CCA RATIO:  1.5 ECA:  65 cm/sec RIGHT CAROTID ARTERY: Mild smooth heterogeneous atherosclerotic plaque in the proximal internal carotid artery. By peak systolic velocity criteria, the estimated stenosis is less than 50%. RIGHT VERTEBRAL ARTERY:  Patent with normal antegrade flow. LEFT CAROTID ARTERY: Mild smooth heterogeneous atherosclerotic plaque in the proximal internal carotid artery. By peak systolic velocity criteria, the estimated stenosis is less than 50%. LEFT VERTEBRAL ARTERY:  Patent with normal antegrade flow. IMPRESSION: 1. Comparing across modalities to the CT arteriogram of the neck performed in July of 2022, no significant interval progression of mild bilateral carotid artery disease. 2. Mild (1-49%) stenosis proximal right internal carotid artery secondary to heterogenous atherosclerotic plaque. 3. Mild (  1-49%) stenosis proximal left internal carotid artery secondary to heterogenous atherosclerotic plaque. 4. Vertebral arteries remain patent with normal antegrade flow. Signed, Criselda Peaches, MD, [facility] Radiology Electronically Signed   By: Jacqulynn Cadet M.D.   On: 06/12/2022 05:57   CT ANGIO HEAD NECK W WO CM  Result Date: 06/11/2022 CLINICAL DATA:  TIA.  Left facial droop.  Numbness to left arm. EXAM: CT ANGIOGRAPHY HEAD AND NECK TECHNIQUE: Multidetector CT imaging of the head and neck was performed using the standard protocol during bolus administration of intravenous contrast. Multiplanar CT image reconstructions and MIPs were  obtained to evaluate the vascular anatomy. Carotid stenosis measurements (when applicable) are obtained utilizing NASCET criteria, using the distal internal carotid diameter as the denominator. RADIATION DOSE REDUCTION: This exam was performed according to the departmental dose-optimization program which includes automated exposure control, adjustment of the mA and/or kV according to patient size and/or use of iterative reconstruction technique. CONTRAST:  47m OMNIPAQUE IOHEXOL 350 MG/ML SOLN COMPARISON:  Same day Brain MRI, CTA head/neck 12/21/20 FINDINGS: CT HEAD FINDINGS Brain: No evidence of acute infarction, hemorrhage, hydrocephalus, extra-axial collection or mass lesion/mass effect. Sequela of mild chronic microvascular ischemic change. Vascular: No hyperdense vessel or unexpected calcification. Skull: Normal. Negative for fracture or focal lesion. Sinuses/Orbits: 2 bilateral lens replacement. No middle ear or mastoid effusion. Paranasal sinuses are clear. Other: None Review of the MIP images confirms the above findings CTA NECK FINDINGS Aortic arch: Standard branching. No aneurysm. There is at least moderate stenosis of the bilateral subclavian arteries with a focal region of severe stenosis of the left (series 10, image 282). Right carotid system: There is an apparent linear focal filling defect in the right common carotid artery (series 10, image 251), which is favored to be artifactual secondary to layering contrast material in the right internal jugular vein. There is mild atherosclerotic calcification at the right carotid bifurcation. Left carotid system: There is moderate to severe focal stenosis in the distal petrous/proximal cavernous segment of the left MCA (series 10, image 127). This has likely progressed compared to 2022. There is mild atherosclerotic calcification at the left carotid bifurcation. Vertebral arteries: V1 segment of the right vertebral artery is poorly visualized due to streak  artifact from venous contrast material, but there may be at least moderate stenosis in the proximal V1 segment (series 10, image 269). The rest of the right vertebral artery is normal in appearance. Skeleton: Negative. Other neck: Negative. Upper chest: Severe centrilobular and paraseptal emphysema. Review of the MIP images confirms the above findings CTA HEAD FINDINGS Anterior circulation: There is moderate narrowing in the distal M1 segment of the left MCA in the inferior division of the left MCA. There is also likely at least mild narrowing at the M1 M2 junction of the right MCA. Posterior circulation: There is moderate to severe focal stenosis of the P2 segment of the left PCA, new from prior exam There is moderate focal stenosis in the proximal aspect of the basilar artery, new from prior exam. Venous sinuses: As permitted by contrast timing, patent. Anatomic variants: Bilateral A2 segments arise from the left ACA. Review of the MIP images confirms the above findings IMPRESSION: Noncontrast CT head: 1. No hemorrhage or CT evidence of an acute infarct. Anterior circulation: 1. Moderate to severe focal stenosis in the distal petrous/proximal cavernous segment of the left MCA, which has progressed compared to 2022. 2. Moderate narrowing in the distal M1 segment of the  left MCA in the inferior division of the left MCA, and at least mild narrowing at the M1 M2 junction of the right MCA. Posterior circulation 1. Moderate stenosis in the proximal to mid segment of the basilar artery, new from prior exam. 2. Moderate to severe focal stenosis of the P2 segment of the left PCA, new from prior exam. 3. V1 segment of the right vertebral artery is poorly visualized due to streak artifact from venous contrast material, but there may be at least moderate stenosis in the proximal V1 segment. Other extracranial vasculature: 1. At least moderate stenosis of the bilateral subclavian arteries with a focal region of severe stenosis  of the left subclavian artery. These results will be called to the ordering clinician or representative by the Radiologist Assistant, and communication documented in the PACS or [employer]. Emphysema (ICD10-J43.9). Electronically Signed   By: Marin Roberts M.D.   On: 06/11/2022 18:25   ECHOCARDIOGRAM COMPLETE  Result Date: 06/11/2022    ECHOCARDIOGRAM REPORT   Patient Name:   Leslie Cardenas [facility] Date of Exam: 06/11/2022 Medical Rec #:  [id number]       Height:       70.0 in Accession #:    [id number]      Weight:       130.0 lb Date of Birth:  Jul 18, 1940        BSA:          1.738 m Patient Age:    42 years        BP:           154/74 mmHg Patient Gender: M               HR:           67 bpm. Exam Location:  [facility] Procedure: 2D Echo, Cardiac Doppler and Color Doppler Indications:    TIA G45.9  History:        Patient has prior history of Echocardiogram examinations, most                 recent 12/22/2020. COPD; Risk Factors:Hypertension and                 Dyslipidemia.  Sonographer:    Alvino Chapel RCS Referring Phys: [id number] ASIA B [place]  1. Left ventricular ejection fraction, by estimation, is 60 to 65%. The left ventricle has normal function. The left ventricle has no regional wall motion abnormalities. There is mild left ventricular hypertrophy. Left ventricular diastolic parameters were normal.  2. Right ventricular systolic function is normal. The right ventricular size is normal. Tricuspid regurgitation signal is inadequate for assessing PA pressure.  3. The mitral valve is grossly normal. No evidence of mitral valve regurgitation.  4. The aortic valve is tricuspid. Aortic valve regurgitation is mild. Aortic valve sclerosis/calcification is present, without any evidence of aortic stenosis.  5. The inferior vena cava is normal in size with greater than 50% respiratory variability, suggesting right atrial pressure of 3 mmHg. Comparison(s): Prior images reviewed side by side. LVEF  remains normal at 60-65%. FINDINGS  Left Ventricle: Left ventricular ejection fraction, by estimation, is 60 to 65%. The left ventricle has normal function. The left ventricle has no regional wall motion abnormalities. The left ventricular internal cavity size was normal in size. There is  mild left ventricular hypertrophy. Left ventricular diastolic parameters were normal. Right Ventricle: The right ventricular size is normal. No increase in right ventricular wall thickness. Right ventricular  systolic function is normal. Tricuspid regurgitation signal is inadequate for assessing PA pressure. Left Atrium: Left atrial size was normal in size. Right Atrium: Right atrial size was normal in size. Pericardium: There is no evidence of pericardial effusion. Mitral Valve: The mitral valve is grossly normal. No evidence of mitral valve regurgitation. Tricuspid Valve: The tricuspid valve is grossly normal. Tricuspid valve regurgitation is trivial. Aortic Valve: The aortic valve is tricuspid. There is mild aortic valve annular calcification. Aortic valve regurgitation is mild. Aortic valve sclerosis/calcification is present, without any evidence of aortic stenosis. Pulmonic Valve: The pulmonic valve was grossly normal. Pulmonic valve regurgitation is trivial. Aorta: The aortic root is normal in size and structure. Venous: The inferior vena cava is normal in size with greater than 50% respiratory variability, suggesting right atrial pressure of 3 mmHg. IAS/Shunts: The interatrial septum was not well visualized.  LEFT VENTRICLE PLAX 2D LVIDd:         3.50 cm   Diastology LVIDs:         2.10 cm   LV e' medial:    8.49 cm/s LV PW:         1.00 cm   LV E/e' medial:  6.5 LV IVS:        1.10 cm   LV e' lateral:   10.40 cm/s LVOT diam:     1.80 cm   LV E/e' lateral: 5.3 LV SV:         51 LV SV Index:   29 LVOT Area:     2.54 cm  RIGHT VENTRICLE RV S prime:     12.30 cm/s TAPSE (M-mode): 2.2 cm LEFT ATRIUM             Index         RIGHT ATRIUM           Index LA diam:        3.00 cm 1.73 cm/m   RA Area:     13.90 cm LA Vol (A2C):   27.5 ml 15.82 ml/m  RA Volume:   32.60 ml  18.76 ml/m LA Vol (A4C):   21.5 ml 12.37 ml/m LA Biplane Vol: 24.6 ml 14.15 ml/m  AORTIC VALVE LVOT Vmax:   108.00 cm/s LVOT Vmean:  63.400 cm/s LVOT VTI:    0.200 m  AORTA Ao Root diam: 3.10 cm MITRAL VALVE MV Area (PHT): 1.89 cm    SHUNTS MV Decel Time: 401 msec    Systemic VTI:  0.20 m MV E velocity: 55.30 cm/s  Systemic Diam: 1.80 cm MV A velocity: 84.00 cm/s MV E/A ratio:  0.66 Rozann Lesches MD Electronically signed by Rozann Lesches MD Signature Date/Time: 06/11/2022/4:24:36 PM    Final    MR BRAIN WO CONTRAST  Result Date: 06/11/2022 CLINICAL DATA:  Transient ischemic attack (TIA) EXAM: MRI HEAD WITHOUT CONTRAST TECHNIQUE: Multiplanar, multiecho pulse sequences of the brain and surrounding structures were obtained without intravenous contrast. COMPARISON:  CT head from the same day. FINDINGS: Brain: No acute infarction, hemorrhage, hydrocephalus, extra-axial collection or mass lesion. Patchy T2/FLAIR hyperintensity in the white matter, nonspecific but compatible with chronic microvascular ischemic disease. Inferior left frontal lobe encephalomalacia. Vascular: Major arterial flow voids maintained. Skull and upper cervical spine: Normal marrow signal. Sinuses/Orbits: Negative. Other: Small mastoid effusions. IMPRESSION: No acute intracranial abnormality. Electronically Signed   By: Margaretha Sheffield M.D.   On: 06/11/2022 09:55   CT HEAD WO CONTRAST  Result Date: 06/11/2022 CLINICAL DATA:  Left-sided facial droop.  EXAM: CT HEAD WITHOUT CONTRAST TECHNIQUE: Contiguous axial images were obtained from the base of the skull through the vertex without intravenous contrast. RADIATION DOSE REDUCTION: This exam was performed according to the departmental dose-optimization program which includes automated exposure control, adjustment of the mA and/or kV according  to patient size and/or use of iterative reconstruction technique. COMPARISON:  December 21, 2020 FINDINGS: Brain: There is mild to moderate severity cerebral atrophy with widening of the extra-axial spaces and ventricular dilatation. There are areas of decreased attenuation within the white matter tracts of the supratentorial brain, consistent with microvascular disease changes. 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 Vascular: No hyperdense vessel or unexpected calcification. Skull: Normal. Negative for fracture or focal lesion. Sinuses/Orbits: No acute finding. Other: None. IMPRESSION: 1. No acute intracranial abnormality. 2. 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 Electronically Signed   By: Virgina Norfolk M.D.   On: 06/11/2022 00:37     Subjective: Seen and examined at bedside.  Overnight events noted.  Patient report doing much better,  symptoms are resolved.  Workup is complete.  Patient wants to be discharged,  neurology agreed with the discharge plan.  Discharge Exam: Vitals:   06/12/22 0808 06/12/22 0818  BP: (!) 134/90   Pulse: 67   Resp: 18   Temp: 97.6 F (36.4 C)   SpO2: 95% 93%   Vitals:   06/11/22 2340 06/12/22 0416 06/12/22 0808 06/12/22 0818  BP: 114/78 (!) 140/76 (!) 134/90   Pulse: (!) 48 69 67   Resp: '18 20 18   '$ Temp: 98 F (36.7 C) 98 F (36.7 C) 97.6 F (36.4 C)   TempSrc: Oral Oral Oral   SpO2: 99% 94% 95% 93%  Weight:      Height:        General: Pt is alert, awake, not in acute distress Cardiovascular: RRR, S1/S2 +, no rubs, no gallops Respiratory: CTA bilaterally, no wheezing, no rhonchi Abdominal: Soft, NT, ND, bowel sounds + Extremities: no edema, no cyanosis    The results of significant diagnostics from this hospitalization (including imaging, microbiology, ancillary and laboratory) are listed below for reference.     Microbiology: No results found for this or any previous visit  (from the past 240 hour(s)).   Labs: BNP (last 3 results) No results for input(s): "BNP" in the last 8760 hours. Basic Metabolic Panel: Recent Labs  Lab 06/11/22 0006 06/11/22 1430  NA 136 138  K 3.7 3.8  CL 103 103  CO2 26 27  GLUCOSE 90 107*  BUN 17 15  CREATININE 1.19 1.17  CALCIUM 9.1 8.9   Liver Function Tests: Recent Labs  Lab 06/11/22 0006 06/11/22 1430  AST 22 18  ALT 14 13  ALKPHOS 63 55  BILITOT 0.6 0.5  PROT 6.8 6.0*  ALBUMIN 3.9 3.4*   No results for input(s): "LIPASE", "AMYLASE" in the last 168 hours. No results for input(s): "AMMONIA" in the last 168 hours. CBC: Recent Labs  Lab 06/11/22 0006 06/11/22 1430  WBC 6.8 6.0  NEUTROABS 4.1  --   HGB 13.7 13.1  HCT 41.3 39.9  MCV 99.3 100.3*  PLT 384 334   Cardiac Enzymes: No results for input(s): "CKTOTAL", "CKMB", "CKMBINDEX", "TROPONINI" in the last 168 hours. BNP: Invalid input(s): "POCBNP" CBG: No results for input(s): "GLUCAP" in the last 168 hours. D-Dimer No results for input(s): "DDIMER" in the last 72 hours. Hgb A1c Recent Labs    06/11/22 0006  HGBA1C 6.0*   Lipid Profile Recent Labs    06/11/22 0006 06/11/22 1430  CHOL 226* 201*  HDL 49 39*  LDLCALC 143* 104*  TRIG 171* 288*  CHOLHDL 4.6 5.2   Thyroid function studies Recent Labs    06/11/22 0006  TSH 1.759   Anemia work up No results for input(s): "VITAMINB12", "FOLATE", "FERRITIN", "TIBC", "IRON", "RETICCTPCT" in the last 72 hours. Urinalysis    Component Value Date/Time   COLORURINE YELLOW 06/11/2022 0121   APPEARANCEUR CLEAR 06/11/2022 0121   LABSPEC 1.011 06/11/2022 0121   PHURINE 6.0 06/11/2022 0121   GLUCOSEU NEGATIVE 06/11/2022 0121   HGBUR SMALL (A) 06/11/2022 0121   BILIRUBINUR NEGATIVE 06/11/2022 0121   KETONESUR NEGATIVE 06/11/2022 0121   PROTEINUR NEGATIVE 06/11/2022 0121   NITRITE NEGATIVE 06/11/2022 0121   LEUKOCYTESUR NEGATIVE 06/11/2022 0121   Sepsis Labs Recent Labs  Lab 06/11/22 0006  06/11/22 1430  WBC 6.8 6.0   Microbiology No results found for this or any previous visit (from the past 240 hour(s)).   Time coordinating discharge: Over 30 minutes  SIGNED:   Shawna Clamp, MD  [facility] 06/12/2022, 2:02 PM Pager

## 2022-06-12 NOTE — Progress Notes (Signed)
Patient has slept most of the night during this shift. Most recent vitals signs are T 98.0 P 49 RR 20 B/P 140/76 O2sat 94 on RA. Patient son is at the bedside. Patient did request medication for sleep and anxiety. This am on patient stated, " I slept better tonight".

## 2022-06-12 NOTE — Progress Notes (Signed)
Patient's heart rate is 48. Patient is not symptomatic. Patient and son state that this is the patient's baseline. MD notified. No new orders.

## 2022-06-12 NOTE — Care Management Obs Status (Signed)
[place] NOTIFICATION   Patient Details  Name: ABDON PETROSKY MRN: [id number] Date of Birth: 04-Dec-1940   Medicare Observation Status Notification Given:  Yes    Tommy Medal 06/12/2022, 10:54 AM

## 2022-06-17 ENCOUNTER — Encounter: Payer: Self-pay | Admitting: Nurse Practitioner

## 2022-06-17 ENCOUNTER — Ambulatory Visit (INDEPENDENT_AMBULATORY_CARE_PROVIDER_SITE_OTHER): Payer: Medicare HMO | Admitting: Nurse Practitioner

## 2022-06-17 VITALS — BP 154/80 | HR 83 | Temp 97.7°F | Ht 68.0 in | Wt 136.4 lb

## 2022-06-17 DIAGNOSIS — G459 Transient cerebral ischemic attack, unspecified: Secondary | ICD-10-CM

## 2022-06-17 DIAGNOSIS — F419 Anxiety disorder, unspecified: Secondary | ICD-10-CM

## 2022-06-17 DIAGNOSIS — J449 Chronic obstructive pulmonary disease, unspecified: Secondary | ICD-10-CM

## 2022-06-17 DIAGNOSIS — I10 Essential (primary) hypertension: Secondary | ICD-10-CM | POA: Diagnosis not present

## 2022-06-17 DIAGNOSIS — Z09 Encounter for follow-up examination after completed treatment for conditions other than malignant neoplasm: Secondary | ICD-10-CM | POA: Diagnosis not present

## 2022-06-17 DIAGNOSIS — Z79899 Other long term (current) drug therapy: Secondary | ICD-10-CM | POA: Diagnosis not present

## 2022-06-17 DIAGNOSIS — E782 Mixed hyperlipidemia: Secondary | ICD-10-CM | POA: Diagnosis not present

## 2022-06-17 MED ORDER — EZETIMIBE 10 MG PO TABS: 10.0000 mg | ORAL_TABLET | Freq: Every day | ORAL | 1 refills | Status: DC

## 2022-06-17 MED ORDER — BUSPIRONE HCL 5 MG PO TABS: ORAL_TABLET | ORAL | 0 refills | Status: DC

## 2022-06-17 NOTE — Progress Notes (Signed)
Hospital follow up  Assessment and Plan: Hospital visit follow up for:   Hospital discharge follow-up Reviewed discharge instructions in full including medication changes, diagnostics, labs, and future follow ups appointment. All questions and concerns addressed.   - CBC with Differential/Platelet - COMPLETE METABOLIC PANEL WITH GFR  TIA (transient ischemic attack) Continue Plavix, ASA, Atorvastatin, Start Zetia Discussed SE of long term anticoagulation, fall prevent, ED for any injury to head.  Follow up with Neurology   - CBC with Differential/Platelet - COMPLETE METABOLIC PANEL WITH GFR  Hyperlipidemia, mixed Continue Atorvastatin, Zetia Discussed lifestyle modifications. Recommended diet heavy in fruits and veggies, omega 3's. Decrease consumption of animal meats, cheeses, and dairy products. Remain active and exercise as tolerated. Continue to monitor.  Chronic obstructive pulmonary disease, unspecified COPD type ([place]) Continue Ventolin, Breztri, Nebulizer Continue to monitor  Essential hypertension Discussed DASH (Dietary Approaches to Stop Hypertension) DASH diet is lower in sodium than a typical American diet. Cut back on foods that are high in saturated fat, cholesterol, and trans fats. Eat more whole-grain foods, fish, poultry, and nuts Remain active and exercise as tolerated daily.  Monitor BP at home-Call if greater than 130/80.  Check CMP/CBC  - CBC with Differential/Platelet - COMPLETE METABOLIC PANEL WITH GFR  Medication management All medications discussed and reviewed in full. All questions and concerns regarding medications addressed.    - CBC with Differential/Platelet - COMPLETE METABOLIC PANEL WITH GFR  Anxiety Start Buspar as directed. Reviewed relaxation techniques.  Sleep hygiene. Recommended Cognitive Behavioral Therapy (CBT). Recommended mindfulness meditation and exercise.   Encouraged personality growth wand development through  coping techniques and problem-solving skills. Limit/Decrease/Monitor drug/alcohol intake.     All medications were reviewed with patient and fully reconciled. All questions answered fully, and patient and family members were encouraged to call the office with any further questions or concerns. Discussed goal to avoid readmission related to this diagnosis.   Over 40 minutes of exam, counseling, chart review, and complex, high/moderate level critical decision making was performed this visit.   Future Appointments  Date Time Provider [place]  07/29/2022 10:00 AM Unk Pinto, MD [facility] None     HPI 82 y.o.male presents for follow up for transition from recent hospitalization or [facility] stay. Admit date to the hospital was 06/10/22, patient was discharged from the hospital on 06/12/22 and our clinical staff contacted the office the day after discharge to set up a follow up appointment. The discharge summary, medications, and diagnostic test results were reviewed before meeting with the patient. The patient was admitted for TIA.  Patient reports that it started with his lips drooping and feeling numb.  Then he noticed his left upper extremity was feeling numb.  He reports his symptoms lasted about 90 minutes.  They are completely gone now.  He reports he had associated left shoulder pain as well and a cramp in his neck.  Patient reports only tried to talk to his family members on the phone he had dysarthria.  He denies ataxia but reports it would be hard to tell since he only has 1 real leg.  Patient reports dysphagia, but that is not particularly new.  He reports he has had that issue before.  He felt like there was a cramp in his neck that was causing his dysphagia.  Patient reports no particular left leg numbness or weakness.  All the symptoms have resolved.  Patient reports that during that time that he was having the symptoms he did have  a pressure-like headache behind his eyes.    He  was discharged on Plavix and Atorvastatin.  He has been taking as directed.  He has not been taking his Zetia, however, he will start.  Hospital visit has left him with some increase in anxiety.  He was provided Benzo in the hospital which he reports was effective.  Having trouble sleeping at night.    Overall he reports feeling well.  He has not followed up with Neurology.  He does not know of an upcoming appointment.   Home health is not involved.   Images while in the hospital: US Carotid Bilateral  Result Date: 06/12/2022 CLINICAL DATA:  Left-sided weakness EXAM: BILATERAL CAROTID DUPLEX ULTRASOUND TECHNIQUE: Pearline Cables scale imaging, color Doppler and duplex ultrasound were performed of bilateral carotid and vertebral arteries in the neck. COMPARISON:  CTA head and neck 12/21/2020 FINDINGS: Criteria: Quantification of carotid stenosis is based on velocity parameters that correlate the residual internal carotid diameter with NASCET-based stenosis levels, using the diameter of the distal internal carotid lumen as the denominator for stenosis measurement. The following velocity measurements were obtained: RIGHT ICA: 68/17 cm/sec CCA: 32/35 cm/sec SYSTOLIC ICA/CCA RATIO:  1.2 ECA:  78 cm/sec LEFT ICA: 96/25 cm/sec CCA: 57/32 cm/sec SYSTOLIC ICA/CCA RATIO:  1.5 ECA:  65 cm/sec RIGHT CAROTID ARTERY: Mild smooth heterogeneous atherosclerotic plaque in the proximal internal carotid artery. By peak systolic velocity criteria, the estimated stenosis is less than 50%. RIGHT VERTEBRAL ARTERY:  Patent with normal antegrade flow. LEFT CAROTID ARTERY: Mild smooth heterogeneous atherosclerotic plaque in the proximal internal carotid artery. By peak systolic velocity criteria, the estimated stenosis is less than 50%. LEFT VERTEBRAL ARTERY:  Patent with normal antegrade flow. IMPRESSION: 1. Comparing across modalities to the CT arteriogram of the neck performed in July of 2022, no significant interval progression of mild  bilateral carotid artery disease. 2. Mild (1-49%) stenosis proximal right internal carotid artery secondary to heterogenous atherosclerotic plaque. 3. Mild (1-49%) stenosis proximal left internal carotid artery secondary to heterogenous atherosclerotic plaque. 4. Vertebral arteries remain patent with normal antegrade flow. Signed, Criselda Peaches, MD, [facility] Electronically Signed   By: Jacqulynn Cadet M.D.   On: 06/12/2022 05:57   CT ANGIO HEAD NECK W WO CM  Result Date: 06/11/2022 CLINICAL DATA:  TIA.  Left facial droop.  Numbness to left arm. EXAM: CT ANGIOGRAPHY HEAD AND NECK TECHNIQUE: Multidetector CT imaging of the head and neck was performed using the standard protocol during bolus administration of intravenous contrast. Multiplanar CT image reconstructions and MIPs were obtained to evaluate the vascular anatomy. Carotid stenosis measurements (when applicable) are obtained utilizing NASCET criteria, using the distal internal carotid diameter as the denominator. RADIATION DOSE REDUCTION: This exam was performed according to the departmental dose-optimization program which includes automated exposure control, adjustment of the mA and/or kV according to patient size and/or use of iterative reconstruction technique. CONTRAST:  56m OMNIPAQUE IOHEXOL 350 MG/ML SOLN COMPARISON:  Same day Brain MRI, CTA head/neck 12/21/20 FINDINGS: CT HEAD FINDINGS Brain: No evidence of acute infarction, hemorrhage, hydrocephalus, extra-axial collection or mass lesion/mass effect. Sequela of mild chronic microvascular ischemic change. Vascular: No hyperdense vessel or unexpected calcification. Skull: Normal. Negative for fracture or focal lesion. Sinuses/Orbits: 2 bilateral lens replacement. No middle ear or mastoid effusion. Paranasal sinuses are clear. Other: None Review of the MIP images confirms the above findings CTA NECK FINDINGS Aortic arch: Standard  branching. No aneurysm. There is  at least moderate stenosis of the bilateral subclavian arteries with a focal region of severe stenosis of the left (series 10, image 282). Right carotid system: There is an apparent linear focal filling defect in the right common carotid artery (series 10, image 251), which is favored to be artifactual secondary to layering contrast material in the right internal jugular vein. There is mild atherosclerotic calcification at the right carotid bifurcation. Left carotid system: There is moderate to severe focal stenosis in the distal petrous/proximal cavernous segment of the left MCA (series 10, image 127). This has likely progressed compared to 2022. There is mild atherosclerotic calcification at the left carotid bifurcation. Vertebral arteries: V1 segment of the right vertebral artery is poorly visualized due to streak artifact from venous contrast material, but there may be at least moderate stenosis in the proximal V1 segment (series 10, image 269). The rest of the right vertebral artery is normal in appearance. Skeleton: Negative. Other neck: Negative. Upper chest: Severe centrilobular and paraseptal emphysema. Review of the MIP images confirms the above findings CTA HEAD FINDINGS Anterior circulation: There is moderate narrowing in the distal M1 segment of the left MCA in the inferior division of the left MCA. There is also likely at least mild narrowing at the M1 M2 junction of the right MCA. Posterior circulation: There is moderate to severe focal stenosis of the P2 segment of the left PCA, new from prior exam There is moderate focal stenosis in the proximal aspect of the basilar artery, new from prior exam. Venous sinuses: As permitted by contrast timing, patent. Anatomic variants: Bilateral A2 segments arise from the left ACA. Review of the MIP images confirms the above findings IMPRESSION: Noncontrast CT head: 1. No hemorrhage or CT evidence of an acute infarct. Anterior  circulation: 1. Moderate to severe focal stenosis in the distal petrous/proximal cavernous segment of the left MCA, which has progressed compared to 2022. 2. Moderate narrowing in the distal M1 segment of the left MCA in the inferior division of the left MCA, and at least mild narrowing at the M1 M2 junction of the right MCA. Posterior circulation 1. Moderate stenosis in the proximal to mid segment of the basilar artery, new from prior exam. 2. Moderate to severe focal stenosis of the P2 segment of the left PCA, new from prior exam. 3. V1 segment of the right vertebral artery is poorly visualized due to streak artifact from venous contrast material, but there may be at least moderate stenosis in the proximal V1 segment. Other extracranial vasculature: 1. At least moderate stenosis of the bilateral subclavian arteries with a focal region of severe stenosis of the left subclavian artery. These results will be called to the ordering clinician or representative by the Radiologist Assistant, and communication documented in the PACS or [employer]. Emphysema (ICD10-J43.9). Electronically Signed   By: Marin Roberts M.D.   On: 06/11/2022 18:25   ECHOCARDIOGRAM COMPLETE  Result Date: 06/11/2022    ECHOCARDIOGRAM REPORT   Patient Name:   Leslie Cardenas [facility] Date of Exam: 06/11/2022 Medical Rec #:  [id number]       Height:       70.0 in Accession #:    [id number]      Weight:       130.0 lb Date of Birth:  05/09/1941        BSA:          1.738 m Patient Age:    19 years  BP:           154/74 mmHg Patient Gender: M               HR:           67 bpm. Exam Location:  [facility] Procedure: 2D Echo, Cardiac Doppler and Color Doppler Indications:    TIA G45.9  History:        Patient has prior history of Echocardiogram examinations, most                 recent 12/22/2020. COPD; Risk Factors:Hypertension and                 Dyslipidemia.  Sonographer:    Alvino Chapel RCS Referring Phys: [id number] ASIA B [place]   1. Left ventricular ejection fraction, by estimation, is 60 to 65%. The left ventricle has normal function. The left ventricle has no regional wall motion abnormalities. There is mild left ventricular hypertrophy. Left ventricular diastolic parameters were normal.  2. Right ventricular systolic function is normal. The right ventricular size is normal. Tricuspid regurgitation signal is inadequate for assessing PA pressure.  3. The mitral valve is grossly normal. No evidence of mitral valve regurgitation.  4. The aortic valve is tricuspid. Aortic valve regurgitation is mild. Aortic valve sclerosis/calcification is present, without any evidence of aortic stenosis.  5. The inferior vena cava is normal in size with greater than 50% respiratory variability, suggesting right atrial pressure of 3 mmHg. Comparison(s): Prior images reviewed side by side. LVEF remains normal at 60-65%. FINDINGS  Left Ventricle: Left ventricular ejection fraction, by estimation, is 60 to 65%. The left ventricle has normal function. The left ventricle has no regional wall motion abnormalities. The left ventricular internal cavity size was normal in size. There is  mild left ventricular hypertrophy. Left ventricular diastolic parameters were normal. Right Ventricle: The right ventricular size is normal. No increase in right ventricular wall thickness. Right ventricular systolic function is normal. Tricuspid regurgitation signal is inadequate for assessing PA pressure. Left Atrium: Left atrial size was normal in size. Right Atrium: Right atrial size was normal in size. Pericardium: There is no evidence of pericardial effusion. Mitral Valve: The mitral valve is grossly normal. No evidence of mitral valve regurgitation. Tricuspid Valve: The tricuspid valve is grossly normal. Tricuspid valve regurgitation is trivial. Aortic Valve: The aortic valve is tricuspid. There is mild aortic valve annular calcification. Aortic valve regurgitation is mild.  Aortic valve sclerosis/calcification is present, without any evidence of aortic stenosis. Pulmonic Valve: The pulmonic valve was grossly normal. Pulmonic valve regurgitation is trivial. Aorta: The aortic root is normal in size and structure. Venous: The inferior vena cava is normal in size with greater than 50% respiratory variability, suggesting right atrial pressure of 3 mmHg. IAS/Shunts: The interatrial septum was not well visualized.  LEFT VENTRICLE PLAX 2D LVIDd:         3.50 cm   Diastology LVIDs:         2.10 cm   LV e' medial:    8.49 cm/s LV PW:         1.00 cm   LV E/e' medial:  6.5 LV IVS:        1.10 cm   LV e' lateral:   10.40 cm/s LVOT diam:     1.80 cm   LV E/e' lateral: 5.3 LV SV:         51 LV SV Index:   29 LVOT Area:  2.54 cm  RIGHT VENTRICLE RV S prime:     12.30 cm/s TAPSE (M-mode): 2.2 cm LEFT ATRIUM             Index        RIGHT ATRIUM           Index LA diam:        3.00 cm 1.73 cm/m   RA Area:     13.90 cm LA Vol (A2C):   27.5 ml 15.82 ml/m  RA Volume:   32.60 ml  18.76 ml/m LA Vol (A4C):   21.5 ml 12.37 ml/m LA Biplane Vol: 24.6 ml 14.15 ml/m  AORTIC VALVE LVOT Vmax:   108.00 cm/s LVOT Vmean:  63.400 cm/s LVOT VTI:    0.200 m  AORTA Ao Root diam: 3.10 cm MITRAL VALVE MV Area (PHT): 1.89 cm    SHUNTS MV Decel Time: 401 msec    Systemic VTI:  0.20 m MV E velocity: 55.30 cm/s  Systemic Diam: 1.80 cm MV A velocity: 84.00 cm/s MV E/A ratio:  0.66 Rozann Lesches MD Electronically signed by Rozann Lesches MD Signature Date/Time: 06/11/2022/4:24:36 PM    Final    MR BRAIN WO CONTRAST  Result Date: 06/11/2022 CLINICAL DATA:  Transient ischemic attack (TIA) EXAM: MRI HEAD WITHOUT CONTRAST TECHNIQUE: Multiplanar, multiecho pulse sequences of the brain and surrounding structures were obtained without intravenous contrast. COMPARISON:  CT head from the same day. FINDINGS: Brain: No acute infarction, hemorrhage, hydrocephalus, extra-axial collection or mass lesion. Patchy T2/FLAIR  hyperintensity in the white matter, nonspecific but compatible with chronic microvascular ischemic disease. Inferior left frontal lobe encephalomalacia. Vascular: Major arterial flow voids maintained. Skull and upper cervical spine: Normal marrow signal. Sinuses/Orbits: Negative. Other: Small mastoid effusions. IMPRESSION: No acute intracranial abnormality. Electronically Signed   By: Margaretha Sheffield M.D.   On: 06/11/2022 09:55   CT HEAD WO CONTRAST  Result Date: 06/11/2022 CLINICAL DATA:  Left-sided facial droop. EXAM: CT HEAD WITHOUT CONTRAST TECHNIQUE: Contiguous axial images were obtained from the base of the skull through the vertex without intravenous contrast. RADIATION DOSE REDUCTION: This exam was performed according to the departmental dose-optimization program which includes automated exposure control, adjustment of the mA and/or kV according to patient size and/or use of iterative reconstruction technique. COMPARISON:  December 21, 2020 FINDINGS: Brain: There is mild to moderate severity cerebral atrophy with widening of the extra-axial spaces and ventricular dilatation. There are areas of decreased attenuation within the white matter tracts of the supratentorial brain, consistent with microvascular disease changes. 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 Vascular: No hyperdense vessel or unexpected calcification. Skull: Normal. Negative for fracture or focal lesion. Sinuses/Orbits: No acute finding. Other: None. IMPRESSION: 1. No acute intracranial abnormality. 2. 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 Electronically Signed   By: Virgina Norfolk M.D.   On: 06/11/2022 00:37     Current Outpatient Medications (Endocrine & Metabolic):    predniSONE (DELTASONE) 5 MG tablet, Take 1 tablet daily as directed for COPD (Patient taking differently: Take 5 mg by mouth as needed. Take 1 tablet daily as directed for COPD)  Current  Outpatient Medications (Cardiovascular):    atorvastatin (LIPITOR) 80 MG tablet, Take 1 tablet (80 mg total) by mouth daily.   ezetimibe (ZETIA) 10 MG tablet, Take 1 tablet (10 mg total) by mouth daily.   nitroGLYCERIN (NITROSTAT) 0.4 MG SL tablet, Place 0.4 mg under the tongue every 5 (five) minutes as needed  for chest pain.  Current Outpatient Medications (Respiratory):    albuterol (VENTOLIN HFA) 108 (90 Base) MCG/ACT inhaler, Inhale 2 puffs into the lungs every 4 (four) hours as needed for wheezing or shortness of breath. Make sure you rinse mouth out after each use.   Budeson-Glycopyrrol-Formoterol (BREZTRI AEROSPHERE) 160-9-4.8 MCG/ACT AERO, Inhale 1 puff into the lungs daily.   fluticasone (FLONASE) 50 MCG/ACT nasal spray, Place 1 spray into both nostrils daily.   guaiFENesin (MUCINEX) 600 MG 12 hr tablet, Take 1 tablet (600 mg total) by mouth 2 (two) times daily.   loratadine (CLARITIN) 10 MG tablet, Take 10 mg by mouth daily.  Current Outpatient Medications (Analgesics):    aspirin EC 81 MG tablet, Take 1 tablet (81 mg total) by mouth daily. Swallow whole.  Current Outpatient Medications (Hematological):    clopidogrel (PLAVIX) 75 MG tablet, Take 1 tablet (75 mg total) by mouth daily.  Current Outpatient Medications (Other):    Multiple Vitamin (MULTIVITAMIN) tablet, Take 1 tablet by mouth daily.   neomycin-polymyxin b-dexamethasone (MAXITROL) 3.5-10000-0.1 OINT, Place 1 Application into both eyes in the morning and at bedtime.   Respiratory Therapy Supplies (NEBULIZER/TUBING/MOUTHPIECE) KIT, Disp one nebulizer machine, tubing set and mouthpiece kit for use 4 times daily   Spacer/Aero-Holding Chambers DEVI, 2 puffs by Does not apply route 2 (two) times daily. Please use with [facility]I inhalers   VITAMIN D PO, Take 5,000 Units by mouth daily.  Past Medical History:  Diagnosis Date   Aortic atherosclerosis ([place]) 03/04/2018   Noted on CT Abd/pelvis   Cataract    Bilateral   Colon polyp     COPD (chronic obstructive pulmonary disease) ([place])    Coronary artery disease    Diverticulosis 07/16/2016   SIGMOID, NOTED ON COLONOSCOPY   Esophageal stenosis 02/24/2018   noted on endoscopy   Esophageal stricture    GERD (gastroesophageal reflux disease)    Grade I diastolic dysfunction 26/71/2458   noted on ECHO   History of hiatal hernia 02/24/2018   Small noted on endoscopy   Hydronephrosis    Hydronephrosis of right kidney 03/04/2018   URETERAL STONE 7 MM, NOTED ONJ CT ABD/PELVIS   Hyperlipidemia    Myocardial infarction [facility])    [id number]   Nephrolithiasis    s/p stent placement   Skin cancer    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 NOTED ON CT ABD/PELVIS   Vitamin D deficiency    Wears dentures    upper     No Known Allergies  ROS: all negative except above.   Physical Exam: Filed Weights   06/17/22 1039  Weight: 136 lb 6.4 oz (61.9 kg)   BP (!) 154/80   Pulse 83   Temp 97.7 F (36.5 C)   Ht '5\' 8"'$  (1.727 m)   Wt 136 lb 6.4 oz (61.9 kg)   SpO2 92%   BMI 20.74 kg/m  General Appearance: Well nourished, in no apparent distress. Eyes: PERRLA, EOMs, conjunctiva no swelling or erythema Sinuses: No Frontal/maxillary tenderness ENT/Mouth: Ext aud canals clear, TMs without erythema, bulging. No erythema, swelling, or exudate on post pharynx.  Tonsils not swollen or erythematous. Hearing normal.  Neck: Supple, thyroid normal.  Respiratory: Respiratory effort normal, BS equal bilaterally without rales, rhonchi, wheezing or stridor.  Cardio: RRR with no MRGs. Brisk peripheral pulses without edema.  Abdomen: Soft, + BS.  Non tender, no guarding, rebound, hernias, masses. Lymphatics: Non tender  without lymphadenopathy.  Musculoskeletal: Full ROM, 5/5 strength, normal gait.  Skin: Warm, dry without rashes, lesions, ecchymosis.  Neuro: Cranial nerves intact. Normal muscle tone, no cerebellar symptoms. Sensation  intact.  Psych: Awake and oriented X 3, normal affect, Insight and Judgment appropriate.     Darrol Jump, NP 11:11 AM [facility]

## 2022-06-17 NOTE — Patient Instructions (Signed)
Ezetimibe Tablets What is this medication? EZETIMIBE (ez ET i mibe) treats high cholesterol. It works by reducing the amount of cholesterol absorbed from the food you eat. This decreases the amount of bad cholesterol (such as LDL) in your blood. Changes to diet and exercise are often combined with this medication. This medicine may be used for other purposes; ask your health care provider or pharmacist if you have questions. COMMON BRAND NAME(S): Zetia What should I tell my care team before I take this medication? They need to know if you have any of these conditions: Kidney disease Liver disease Muscle cramps, pain Muscle injury Thyroid disease An unusual or allergic reaction to ezetimibe, other medications, foods, dyes, or preservatives Pregnant or trying to get pregnant Breast-feeding How should I use this medication? Take this medication by mouth. Take it as directed on the prescription label at the same time every day. You can take it with or without food. If it upsets your stomach, take it with food. Keep taking it unless your care team tells you to stop. Take bile acid sequestrants at a different time of day than this medication. Take this medication 2 hours BEFORE or 4 hours AFTER bile acid sequestrants. Talk to your care team about the use of this medication in children. While it may be prescribed for children as young as 10 for selected conditions, precautions do apply. Overdosage: If you think you have taken too much of this medicine contact a poison control center or emergency room at once. NOTE: This medicine is only for you. Do not share this medicine with others. What if I miss a dose? If you miss a dose, take it as soon as you can. If it is almost time for your next dose, take only that dose. Do not take double or extra doses. What may interact with this medication? Do not take this medication with any of the following: Fenofibrate Gemfibrozil This medication may also  interact with the following: Antacids Cyclosporine Herbal medications like red yeast rice Other medications to lower cholesterol or triglycerides This list may not describe all possible interactions. Give your health care provider a list of all the medicines, herbs, non-prescription drugs, or dietary supplements you use. Also tell them if you smoke, drink alcohol, or use illegal drugs. Some items may interact with your medicine. What should I watch for while using this medication? Visit your care team for regular checks on your progress. Tell your care team if your symptoms do not start to get better or if they get worse. Your care team may tell you to stop taking this medication if you develop muscle problems. If your muscle problems do not go away after stopping this medication, contact your care team. Do not become pregnant while taking this medication. Women should inform their care team if they wish to become pregnant or think they might be pregnant. There is potential for serious harm to an unborn child. Talk to your care team for more information. Do not breast-feed an infant while taking this medication. Taking this medication is only part of a total heart healthy program. Your care team may give you a special diet to follow. Avoid alcohol. Avoid smoking. Ask your care team how much you should exercise. What side effects may I notice from receiving this medication? Side effects that you should report to your doctor or health care provider as soon as possible: Allergic reactions--skin rash, itching or hives, swelling of the face, lips, tongue, or throat Side  effects that usually do not require medical attention (report to your doctor or health care provider if they continue or are bothersome): Diarrhea Joint pain This list may not describe all possible side effects. Call your doctor for medical advice about side effects. You may report side effects to FDA at 1-800-FDA-1088. Where should I  keep my medication? Keep out of the reach of children and pets. Store at room temperature between 15 and 30 degrees C (59 and 86 degrees F). Protect from moisture. Get rid of any unused medication after the expiration date. NOTE: This sheet is a summary. It may not cover all possible information. If you have questions about this medicine, talk to your doctor, pharmacist, or health care provider.  2023 Elsevier/Gold Standard (2020-05-29 00:00:00)

## 2022-06-18 LAB — CBC WITH DIFFERENTIAL/PLATELET
Absolute Monocytes: 571 cells/uL (ref 200–950)
Basophils Absolute: 61 cells/uL (ref 0–200)
Basophils Relative: 0.6 %
Eosinophils Absolute: 10 cells/uL — ABNORMAL LOW (ref 15–500)
Eosinophils Relative: 0.1 %
HCT: 40.4 % (ref 38.5–50.0)
Hemoglobin: 14 g/dL (ref 13.2–17.1)
Lymphs Abs: 643 cells/uL — ABNORMAL LOW (ref 850–3900)
MCH: 33 pg (ref 27.0–33.0)
MCHC: 34.7 g/dL (ref 32.0–36.0)
MCV: 95.3 fL (ref 80.0–100.0)
MPV: 9.3 fL (ref 7.5–12.5)
Monocytes Relative: 5.6 %
Neutro Abs: 8915 cells/uL — ABNORMAL HIGH (ref 1500–7800)
Neutrophils Relative %: 87.4 %
Platelets: 383 10*3/uL (ref 140–400)
RBC: 4.24 10*6/uL (ref 4.20–5.80)
RDW: 13.1 % (ref 11.0–15.0)
Total Lymphocyte: 6.3 %
WBC: 10.2 10*3/uL (ref 3.8–10.8)

## 2022-06-18 LAB — COMPLETE METABOLIC PANEL WITH GFR
AG Ratio: 1.5 (calc) (ref 1.0–2.5)
ALT: 14 U/L (ref 9–46)
AST: 17 U/L (ref 10–35)
Albumin: 4.3 g/dL (ref 3.6–5.1)
Alkaline phosphatase (APISO): 68 U/L (ref 35–144)
BUN: 13 mg/dL (ref 7–25)
CO2: 26 mmol/L (ref 20–32)
Calcium: 10.7 mg/dL — ABNORMAL HIGH (ref 8.6–10.3)
Chloride: 103 mmol/L (ref 98–110)
Creat: 1.15 mg/dL (ref 0.70–1.22)
Globulin: 2.8 g/dL (calc) (ref 1.9–3.7)
Glucose, Bld: 92 mg/dL (ref 65–99)
Potassium: 4.6 mmol/L (ref 3.5–5.3)
Sodium: 139 mmol/L (ref 135–146)
Total Bilirubin: 0.4 mg/dL (ref 0.2–1.2)
Total Protein: 7.1 g/dL (ref 6.1–8.1)
eGFR: 64 mL/min/{1.73_m2} (ref >=60)

## 2022-06-24 ENCOUNTER — Encounter: Payer: Medicare HMO | Admitting: Adult Health

## 2022-06-29 ENCOUNTER — Encounter: Payer: Medicare HMO | Admitting: Internal Medicine

## 2022-07-15 ENCOUNTER — Encounter: Payer: Medicare HMO | Admitting: Internal Medicine

## 2022-07-18 NOTE — Progress Notes (Unsigned)
Synopsis: Referred for COPD by Unk Pinto, MD  Subjective:   PATIENT ID: Leslie Cardenas GENDER: male DOB: 1940/08/31, MRN: [id number]  No chief complaint on file.  82yM with history of COPD, CAD, diastolic dysfunction, Esophageal stenosis/stricture, smoking  He uses breztri 2 puffs twice daily and he's figured out that he needs to rinse his mouth out after using it due to hoarseness. He's only been on the inhaler for the last couple of weeks. Hoarseness is a little better now. He thinks it has been helpful for his breathing. Sometimes has cough productive of whitish phlegm in the morning.   He is on chronic prednisone which he thinks is mostly for itchy rash but may be for COPD. I see note from 2016 that he's taking it for dry skin.  He still gets intermittent dysphagia, last esophageal stricture dilation 07/26/20.   No family history of lung cancer, lung disease  Quit smoking 04/11/2000. 60 py smoker.   Interval HPI: Seen after recent AECOPD at last visit, added mucinex, flutter valve after breztri, flonase, zyrtec for rhinitis.  Has stayed off steroids since last visit.  ------------------------- Hospitalized for TIA since last visit. Maintained on breztri, allegra with prn airway  clearance regimen  Otherwise pertinent review of systems is negative.   Past Medical History:  Diagnosis Date   Aortic atherosclerosis ([place]) 03/04/2018   Noted on CT Abd/pelvis   Cataract    Bilateral   Colon polyp    COPD (chronic obstructive pulmonary disease) ([place])    Coronary artery disease    Diverticulosis 07/16/2016   SIGMOID, NOTED ON COLONOSCOPY   Esophageal stenosis 02/24/2018   noted on endoscopy   Esophageal stricture    GERD (gastroesophageal reflux disease)    Grade I diastolic dysfunction 0000000   noted on ECHO   History of hiatal hernia 02/24/2018   Small noted on endoscopy   Hydronephrosis    Hydronephrosis of right kidney 03/04/2018   URETERAL STONE 7 MM,  NOTED ONJ CT ABD/PELVIS   Hyperlipidemia    Myocardial infarction [facility])    [id number]   Nephrolithiasis    s/p stent placement   Skin cancer    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 NOTED ON CT ABD/PELVIS   Vitamin D deficiency    Wears dentures    upper     Family History  Problem Relation Age of Onset   Hypertension Father    Pancreatic cancer Mother    Cancer Mother    Colon cancer Brother    Leukemia Brother    Prostate cancer Brother    Other Brother        polio   Esophageal cancer Neg Hx    Rectal cancer Neg Hx    Stomach cancer Neg Hx      Past Surgical History:  Procedure Laterality Date   CARDIAC CATHETERIZATION  1983   No intervention performed.    CATARACT EXTRACTION, BILATERAL     COLONOSCOPY     COLONOSCOPY W/ POLYPECTOMY  08/12/2010   Dr. Silvano Rusk   COLONOSCOPY W/ POLYPECTOMY  07/16/2016   CYSTOSCOPY W/ URETERAL STENT PLACEMENT Right 03/04/2018   Procedure: CYSTOSCOPY WITH RETROGRADE PYELOGRAM/URETERAL STENT PLACEMENT;  Surgeon: [name], MD;  Location: [facility];  Service: Urology;  Laterality: Right;   CYSTOSCOPY/URETEROSCOPY/HOLMIUM LASER/STENT PLACEMENT Right 03/16/2018   Procedure: CYSTOSCOPY RIGHT URETEROSCOPY/HOLMIUM LASER/STENT PLACEMENT;  Surgeon: [name], MD;  Location: [facility];  Service: Urology;  Laterality: Right;   ESOPHAGOGASTRODUODENOSCOPY     INGUINAL HERNIA REPAIR     left   right leg amputation  05/14/1977   SHOULDER ARTHROSCOPY Left 2000   UPPER GASTROINTESTINAL ENDOSCOPY  WITH DILATION  02/24/2018    Social History   Socioeconomic History   Marital status: Divorced    Spouse name: Not on file   Number of children: 4   Years of education: Not on file   Highest education level: 11th grade  Occupational History   Occupation: retired    Fish farm manager: RETIRED  Tobacco Use   Smoking status: Former    Packs/day: 3.00    Years: 20.00    Total  pack years: 60.00    Types: Cigarettes    Quit date: 04/11/2000    Years since quitting: 22.2   Smokeless tobacco: Never  Vaping Use   Vaping Use: Never used  Substance and Sexual Activity   Alcohol use: Not Currently    Comment: rare   Drug use: No   Sexual activity: Not Currently  Other Topics Concern   Not on file  [employer] 4 yrs + 2 [employer]   [employer] 62-65 then to [employer]   Has 4 children he is divorced   Former smoker rare alcohol no drugs   Lives in [place], [place].    Social Determinants of Health   Financial Resource Strain: Not on file  Food Insecurity: Not on file  Transportation Needs: Not on file  Physical Activity: Not on file  Stress: Not on file  Social Connections: Not on file  Intimate Partner Violence: Not on file     No Known Allergies   Outpatient Medications Prior to Visit  Medication Sig Dispense Refill   albuterol (VENTOLIN HFA) 108 (90 Base) MCG/ACT inhaler Inhale 2 puffs into the lungs every 4 (four) hours as needed for wheezing or shortness of breath. Make sure you rinse mouth out after each use. 18 g 2   aspirin EC 81 MG tablet Take 1 tablet (81 mg total) by mouth daily. Swallow whole. 90 tablet 0   atorvastatin (LIPITOR) 80 MG tablet Take 1 tablet (80 mg total) by mouth daily. 30 tablet 1   Budeson-Glycopyrrol-Formoterol (BREZTRI AEROSPHERE) 160-9-4.8 MCG/ACT AERO Inhale 1 puff into the lungs daily.     busPIRone (BUSPAR) 5 MG tablet Take     1/2 to 1 tablet     3 x /day      for Anxiety 270 tablet 0   clopidogrel (PLAVIX) 75 MG tablet Take 1 tablet (75 mg total) by mouth daily. 30 tablet 2   ezetimibe (ZETIA) 10 MG tablet Take 1 tablet (10 mg total) by mouth daily. 30 tablet 1   fluticasone (FLONASE) 50 MCG/ACT nasal spray Place 1 spray into both nostrils daily.     guaiFENesin (MUCINEX) 600 MG 12 hr tablet Take 1 tablet (600 mg total) by mouth 2 (two) times daily. 60 tablet 3   loratadine (CLARITIN) 10 MG  tablet Take 10 mg by mouth daily.     Multiple Vitamin (MULTIVITAMIN) tablet Take 1 tablet by mouth daily.     neomycin-polymyxin b-dexamethasone (MAXITROL) 3.5-10000-0.1 OINT Place 1 Application into both eyes in the morning and at bedtime.     nitroGLYCERIN (NITROSTAT) 0.4 MG SL tablet Place 0.4 mg under the tongue every 5 (five) minutes as needed for chest pain.  predniSONE (DELTASONE) 5 MG tablet Take 1 tablet daily as directed for COPD (Patient taking differently: Take 5 mg by mouth as needed. Take 1 tablet daily as directed for COPD) 90 tablet 1   Respiratory Therapy Supplies (NEBULIZER/TUBING/MOUTHPIECE) KIT Disp one nebulizer machine, tubing set and mouthpiece kit for use 4 times daily 1 kit 0   Spacer/Aero-Holding Chambers DEVI 2 puffs by Does not apply route 2 (two) times daily. Please use with MDI inhalers 1 each 0   VITAMIN D PO Take 5,000 Units by mouth daily.     No facility-administered medications prior to visit.       Objective:   Physical Exam:  General appearance: 82 y.o., male, NAD, conversant, thin Eyes: anicteric sclerae; PERRL, tracking appropriately HENT: NCAT; MMM. Deviated septum with narrow passage on left.  Neck: Trachea midline; no lymphadenopathy, no JVD Lungs: rhonchorous bl, with normal respiratory effort CV: RRR, no murmur  Abdomen: Soft, non-tender; non-distended, BS present  Extremities: No peripheral edema, warm Skin: Normal turgor and texture; no rash Psych: Appropriate affect Neuro: Alert and oriented to person and place, no focal deficit     There were no vitals filed for this visit.       on RA BMI Readings from Last 3 Encounters:  06/17/22 20.74 kg/m  06/11/22 19.64 kg/m  04/28/22 20.56 kg/m   Wt Readings from Last 3 Encounters:  06/17/22 136 lb 6.4 oz (61.9 kg)  06/11/22 129 lb 3 oz (58.6 kg)  04/28/22 135 lb 3.2 oz (61.3 kg)     CBC    Component Value Date/Time   WBC 10.2 06/17/2022 0000   RBC 4.24 06/17/2022 0000    HGB 14.0 06/17/2022 0000   HCT 40.4 06/17/2022 0000   PLT 383 06/17/2022 0000   MCV 95.3 06/17/2022 0000   MCH 33.0 06/17/2022 0000   MCHC 34.7 06/17/2022 0000   RDW 13.1 06/17/2022 0000   LYMPHSABS 643 (L) 06/17/2022 0000   MONOABS 0.8 06/11/2022 0006   EOSABS 10 (L) 06/17/2022 0000   BASOSABS 61 06/17/2022 0000    Eos 500-600   Chest Imaging: LDCT CHest 2016 reviewed by me with diffuse bronchial wall thickening, emphysema  CXR 2020 reviewed by me clear lungs  CXR 01/13/22 reviewed by me with coarsened interstitium which could be suggestive of chronic bronchitic change  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  RTC 6 weeks  Maryjane Hurter, MD [facility] 07/18/2022 3:33 PM

## 2022-07-20 ENCOUNTER — Encounter: Payer: Self-pay | Admitting: Student

## 2022-07-20 ENCOUNTER — Ambulatory Visit: Payer: Medicare HMO | Admitting: Student

## 2022-07-20 VITALS — BP 124/74 | HR 85 | Temp 97.8°F | Ht 68.0 in | Wt 136.6 lb

## 2022-07-20 DIAGNOSIS — J4489 Other specified chronic obstructive pulmonary disease: Secondary | ICD-10-CM

## 2022-07-20 DIAGNOSIS — J329 Chronic sinusitis, unspecified: Secondary | ICD-10-CM | POA: Diagnosis not present

## 2022-07-20 MED ORDER — BREZTRI AEROSPHERE 160-9-4.8 MCG/ACT IN AERO: 2.0000 | INHALATION_SPRAY | Freq: Two times a day (BID) | RESPIRATORY_TRACT | 0 refills | Status: DC

## 2022-07-20 MED ORDER — BREZTRI AEROSPHERE 160-9-4.8 MCG/ACT IN AERO: 2.0000 | INHALATION_SPRAY | Freq: Two times a day (BID) | RESPIRATORY_TRACT | 11 refills | Status: DC

## 2022-07-20 NOTE — Addendum Note (Signed)
Addended by: Rosana Berger on: 07/20/2022 12:03 PM   Modules accepted: Orders

## 2022-07-20 NOTE — Patient Instructions (Signed)
-   guaifenesin (mucinex) 600 mg twice daily when you have chest congestion like this - allegra 180 mg daily when you have postnasal drainage - breztri 2 puffs twice daily with spacer - THEN flutter valve 10 slow but firm puffs twice daily until chest congestion clears up - flonase 1 spray each nostril after clearing nose out following shower - referral to ENT placed today - see you in 6 months or sooner if need be!

## 2022-07-28 ENCOUNTER — Encounter: Payer: Self-pay | Admitting: Internal Medicine

## 2022-07-28 NOTE — Progress Notes (Unsigned)
Future Appointments  Date Time Provider Department  07/29/2022 10:00 AM Unk Pinto, MD [facility]  08/13/2022  3:15 PM Alric Ran, MD [facility]  08/05/2023 10:00 AM Unk Pinto, MD [facility]    Annual  Screening/Preventative Visit  & Comprehensive Evaluation & Examination          This very nice 82 y.o. DWM presents for a Screening /Preventative Visit & comprehensive evaluation and management of multiple medical co-morbidities.  Patient has been followed for HTN, HLD, COPD,  Prediabetes and Vitamin D Deficiency. CXR in Feb 2020 showed Thoracic Aortic atherosclerosis.  Patient is on Bronchodilator therapy for his COPD.                                                     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        HTN predates since 51. Patient's BP has been controlled at home.  Today's BP  is at goal - 128/70 .   Patient has hx/o ASCAD with MI (1983). In July 2022, he was briefly hospitalized with a TIA with tingling of the LUE .  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        Patient's hyperlipidemia is not controlled with diet and Rosuvastatin /Ezetimibe . Patient denies myalgias or other medication SE's. Last lipids were not at goal :  Lab Results  Component Value Date   CHOL 201 (H) 06/11/2022   HDL 39 (L) 06/11/2022   LDLCALC 104 (H) 06/11/2022   TRIG 288 (H) 06/11/2022   CHOLHDL 5.2 06/11/2022         Patient has hx/o prediabetes (A1c 5.9% /2011 & 6.4% /2016) and patient denies reactive hypoglycemic symptoms, visual blurring, diabetic polys or paresthesias. Last A1c was near goal :   Lab Results  Component Value Date   HGBA1C 6.0 (H) 06/11/2022         Finally, patient has history of Vitamin D Deficiency ("24" /2008) and last vitamin D was still low :   Lab Results   Component Value Date   VD25OH 42 12/31/2021      Current Outpatient Medications  Medication Instructions   albuterol (VENTOLIN HFA) 108 (90 Base) MCG/ACT inhaler 2 puffs, Inhalation, Every 4 hours PRN, Make sure you rinse mouth out after each use.   aspirin EC 81 mg, Oral, Daily, Swallow whole.   atorvastatin (LIPITOR) 80 mg, Oral, Daily   BREZTRI AEROSPHERE) 160-9-4.8  2 puffs, Inhalation, 2 times daily   busPIRone ( 5 MG tablet Take     1/2 to 1 tablet     3 x /day      for Anxiety   clopidogrel (PLAVIX) 75 mg, Oral, Daily   ezetimibe (ZETIA) 10 mg, Oral, Daily   FLONASE nasal spray 1 spray, Each Nare, Daily   guaiFENesin (MUCINEX) 600 mg, Oral, 2 times daily   loratadine (CLARITIN) 10 mg, Oral, Daily   Multiple Vitamin (MULTIVITAMIN) tablet 1 tablet, Oral, Daily   neomycin-polymyxin b-dexamethasone (MAXITROL) [id number] OINT 1 Application, Both Eyes, 2 times daily   nitroGLYCERIN (NITROSTAT) 0.4 mg, Sublingual, Every 5 min PRN   Respiratory Therapy Supplies (  NEBULIZER/TUBING/MOUTHPIECE) KIT Disp one nebulizer machine, tubing set and mouthpiece kit for use 4 times daily   Spacer/Aero-Holding Chambers DEVI 2 puffs, Does not apply, 2 times daily, Please use with MDI inhalers   VITAMIN D PO 5,000 Units, Oral, Daily      Past Medical History:  Diagnosis Date   Aortic atherosclerosis ([place]) 03/04/2018   Noted on CT Abd/pelvis   Cataract    Bilateral   Colon polyp    COPD (chronic obstructive pulmonary disease) ([place])    Coronary artery disease    Diverticulosis 07/16/2016   SIGMOID, NOTED ON COLONOSCOPY   Esophageal stenosis 02/24/2018   noted on endoscopy   Esophageal stricture    GERD (gastroesophageal reflux disease)    Grade I diastolic dysfunction 0000000   noted on ECHO   History of hiatal hernia 02/24/2018   Small noted on endoscopy   Hydronephrosis    Hydronephrosis of right kidney 03/04/2018   URETERAL STONE 7 MM, NOTED ONJ CT ABD/PELVIS   Hyperlipidemia     Myocardial infarction [facility])    [id number]   Nephrolithiasis    s/p stent placement   Skin cancer    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 NOTED ON CT ABD/PELVIS   Vitamin D deficiency    Wears dentures    upper     Health Maintenance  Topic Date Due   COVID-19 Vaccine (3 - Moderna risk series) 09/09/2019   Zoster Vaccines- Shingrix (1 of 2) 07/15/2021 (Originally 12/07/1959)   TETANUS/TDAP  10/20/2029   Pneumonia Vaccine 13+ Years old  Completed   INFLUENZA VACCINE  Completed   HPV VACCINES  Aged Out     Immunization History  Administered Date(s) Administered   Influenza Split 04/08/2012   Influenza, High Dose  03/04/2017, 04/14/2021   Influenza,inj,Quad  05/16/2015, 02/15/2018   Moderna Sars-Covid-2 Vacc 07/15/2019, 08/12/2019   Pneumococcal -13 07/25/2014   Pneumococcal -23 04/08/2012   Pneumococcal-23 06/06/2002   Td 06/30/2011   Tdap 10/21/2019   Zoster, Live 05/22/2008    Last Colon - 07/16/2016 - Dr Carlean Purl - Recc No repeat due to age    Past Surgical History:  Procedure Laterality Date   [place]   No intervention performed.    CATARACT EXTRACTION, BILATERAL     COLONOSCOPY     COLONOSCOPY W/ POLYPECTOMY  08/12/2010   Dr. Silvano Rusk   COLONOSCOPY W/ POLYPECTOMY  07/16/2016   CYSTOSCOPY W/ URETERAL STENT PLACEMENT Right 03/04/2018   Procedure: CYSTOSCOPY WITH RETROGRADE PYELOGRAM/URETERAL STENT PLACEMENT;  Surgeon: [name], MD;  Location: [place];  Service: Urology;  Laterality: Right;   CYSTOSCOPY/URETEROSCOPY/HOLMIUM LASER/STENT PLACEMENT Right 03/16/2018   Procedure: CYSTOSCOPY RIGHT URETEROSCOPY/HOLMIUM LASER/STENT PLACEMENT;  Surgeon: [name], MD;  Location: [facility];  Service: Urology;  Laterality: Right;   ESOPHAGOGASTRODUODENOSCOPY     INGUINAL HERNIA REPAIR     left   right leg amputation  05/14/1977   SHOULDER ARTHROSCOPY Left 2000    UPPER GASTROINTESTINAL ENDOSCOPY  WITH DILATION  02/24/2018     Family History  Problem Relation Age of Onset   Hypertension Father    Pancreatic cancer Mother    Cancer Mother    Colon cancer Brother    Leukemia Brother    Prostate cancer Brother    Other Brother        polio   Esophageal cancer Neg Hx  Rectal cancer Neg Hx    Stomach cancer Neg Hx      Social History   Tobacco Use   Smoking status: Former    Packs/day: 3.00    Years: 20.00    Pack years: 60.00    Types: Cigarettes    Quit date: 04/11/2000    Years since quitting: 21.2   Smokeless tobacco: Never  Substance Use Topics   Alcohol use: Not Currently    Comment: rare   Drug use: No      ROS Constitutional: Denies fever, chills, weight loss/gain, headaches, insomnia,  night sweats or change in appetite. Does c/o fatigue. Eyes: Denies redness, blurred vision, diplopia, discharge, itchy or watery eyes.  ENT: Denies discharge, congestion, post nasal drip, epistaxis, sore throat, earache, hearing loss, dental pain, Tinnitus, Vertigo, Sinus pain or snoring.  Cardio: Denies chest pain, palpitations, irregular heartbeat, syncope, dyspnea, diaphoresis, orthopnea, PND, claudication or edema Respiratory: denies cough, dyspnea, DOE, pleurisy, hoarseness, laryngitis or wheezing.  Gastrointestinal: Denies dysphagia, heartburn, reflux, water brash, pain, cramps, nausea, vomiting, bloating, diarrhea, constipation, hematemesis, melena, hematochezia, jaundice or hemorrhoids Genitourinary: Denies dysuria, frequency, urgency, nocturia, hesitancy, discharge, hematuria or flank pain Musculoskeletal: Denies arthralgia, myalgia, stiffness, Jt. Swelling, pain, limp or strain/sprain. Denies Falls. Skin: Denies puritis, rash, hives, warts, acne, eczema or change in skin lesion Neuro: No weakness, tremor, incoordination, spasms, paresthesia or pain Psychiatric: Denies confusion, memory loss or sensory loss. Denies  Depression. Endocrine: Denies change in weight, skin, hair change, nocturia, and paresthesia, diabetic polys, visual blurring or hyper / hypo glycemic episodes.  Heme/Lymph: No excessive bleeding, bruising or enlarged lymph nodes.   Physical Exam  BP 128/70   Pulse 76   Temp 97.6 F (36.4 C)   Resp 16   Ht 5' 8"$  (1.727 m)   Wt 138 lb 6.4 oz (62.8 kg)   SpO2 95%   BMI 21.04 kg/m   General Appearance: Well nourished and well groomed and in no apparent distress.  Eyes: PERRLA, EOMs, conjunctiva no swelling or erythema, normal fundi and vessels. Sinuses: No frontal/maxillary tenderness ENT/Mouth: EACs patent / TMs  nl. Nares clear without erythema, swelling, mucoid exudates. Oral hygiene is good. No erythema, swelling, or exudate. Tongue normal, non-obstructing. Tonsils not swollen or erythematous. Hearing normal.  Neck: Supple, thyroid not palpable. No bruits, nodes or JVD. Respiratory: Respiratory effort normal.  BS equal and clear bilateral without rales, rhonci, wheezing or stridor. Cardio: Heart sounds are normal with regular rate and rhythm and no murmurs, rubs or gallops. Peripheral pulses are normal and equal bilaterally without edema. No aortic or femoral bruits. Chest: symmetric with normal excursions and percussion.  Abdomen: Soft, with Nl bowel sounds. Nontender, no guarding, rebound, hernias, masses, or organomegaly.  Lymphatics: Non tender without lymphadenopathy.  Musculoskeletal: Rt BKA prosthesis , otherwise FROM, joint stability, 5/5 strength, and normal gait. Skin: Warm and dry without rashes, lesions, cyanosis, clubbing or  ecchymosis.  Neuro: Cranial nerves intact, reflexes equal bilaterally. Normal muscle tone, no cerebellar symptoms. Sensation intact.  Pysch: Alert and oriented X 3 with normal affect, insight and judgment appropriate.   Assessment and Plan  1. Annual Preventative/Screening Exam    2. Essential hypertension  - EKG 12-Lead - [place], RETROPERITNL  ABD,  LTD - Urinalysis, Routine w reflex microscopic - Microalbumin / creatinine urine ratio - CBC with Differential/Platelet - COMPLETE METABOLIC PANEL WITH GFR - Magnesium - TSH  3. Hyperlipidemia, mixed  - EKG 12-Lead - [place], RETROPERITNL ABD,  LTD -  Lipid panel - TSH  4. Abnormal glucose  - EKG 12-Lead - [place], RETROPERITNL ABD,  LTD - Hemoglobin A1c - Insulin, random  5. Vitamin D deficiency  - VITAMIN D 25 Hydroxy  6. Thoracic aortic atherosclerosis ([place]) by CXR on 07/29/2018  - EKG 12-Lead - Lipid panel  7. ASHD (arteriosclerotic heart disease)  - EKG 12-Lead - Lipid panel  8. TIA (transient ischemic attack)  - Lipid panel  9. S/P unilateral BKA (below knee amputation), right ([place])   10. Gastroesophageal reflux disease without esophagitis  - CBC with Differential/Platelet  11. BPH with obstruction/lower urinary tract symptoms  - PSA  12. Prostate cancer screening  - PSA  13. Screening for colorectal cancer  - POC Hemoccult Bld/Stl   14. Screening for heart disease  - EKG 12-Lead  15. FH: hypertension  - EKG 12-Lead - [place], RETROPERITNL ABD,  LTD  16. Former smoker  - EKG 12-Lead - [place], RETROPERITNL ABD,  LTD  17. Screening for AAA (aortic abdominal aneurysm)  - [place], RETROPERITNL ABD,  LTD  18. Medication management  - Urinalysis, Routine w reflex microscopic - Microalbumin / creatinine urine ratio - CBC with Differential/Platelet - COMPLETE METABOLIC PANEL WITH GFR - Magnesium - Lipid panel - TSH - Hemoglobin A1c - Insulin, random - VITAMIN D 25 Hydroxy           Patient was counseled in prudent diet, weight control to achieve/maintain BMI less than 25, BP monitoring, regular exercise and medications as discussed.  Discussed med effects and SE's. Routine screening labs and tests as requested with regular follow-up as recommended. Over 40 minutes of exam, counseling, chart review and high complex critical decision making was  performed   Kirtland Bouchard, MD

## 2022-07-28 NOTE — Patient Instructions (Signed)

## 2022-07-29 ENCOUNTER — Ambulatory Visit (INDEPENDENT_AMBULATORY_CARE_PROVIDER_SITE_OTHER): Payer: Medicare HMO | Admitting: Internal Medicine

## 2022-07-29 ENCOUNTER — Encounter: Payer: Self-pay | Admitting: Internal Medicine

## 2022-07-29 VITALS — BP 128/70 | HR 76 | Temp 97.6°F | Resp 16 | Ht 68.0 in | Wt 138.4 lb

## 2022-07-29 DIAGNOSIS — Z87891 Personal history of nicotine dependence: Secondary | ICD-10-CM

## 2022-07-29 DIAGNOSIS — E782 Mixed hyperlipidemia: Secondary | ICD-10-CM

## 2022-07-29 DIAGNOSIS — R7309 Other abnormal glucose: Secondary | ICD-10-CM | POA: Diagnosis not present

## 2022-07-29 DIAGNOSIS — Z125 Encounter for screening for malignant neoplasm of prostate: Secondary | ICD-10-CM | POA: Diagnosis not present

## 2022-07-29 DIAGNOSIS — Z136 Encounter for screening for cardiovascular disorders: Secondary | ICD-10-CM

## 2022-07-29 DIAGNOSIS — Z0001 Encounter for general adult medical examination with abnormal findings: Secondary | ICD-10-CM

## 2022-07-29 DIAGNOSIS — I10 Essential (primary) hypertension: Secondary | ICD-10-CM | POA: Diagnosis not present

## 2022-07-29 DIAGNOSIS — N1831 Chronic kidney disease, stage 3a: Secondary | ICD-10-CM

## 2022-07-29 DIAGNOSIS — E559 Vitamin D deficiency, unspecified: Secondary | ICD-10-CM | POA: Diagnosis not present

## 2022-07-29 DIAGNOSIS — Z0000 Encounter for general adult medical examination without abnormal findings: Secondary | ICD-10-CM

## 2022-07-29 DIAGNOSIS — Z8249 Family history of ischemic heart disease and other diseases of the circulatory system: Secondary | ICD-10-CM

## 2022-07-29 DIAGNOSIS — Z79899 Other long term (current) drug therapy: Secondary | ICD-10-CM | POA: Diagnosis not present

## 2022-07-29 DIAGNOSIS — I2510 Atherosclerotic heart disease of native coronary artery without angina pectoris: Secondary | ICD-10-CM

## 2022-07-29 DIAGNOSIS — N401 Enlarged prostate with lower urinary tract symptoms: Secondary | ICD-10-CM

## 2022-07-29 DIAGNOSIS — I700 Atherosclerosis of aorta: Secondary | ICD-10-CM

## 2022-07-29 DIAGNOSIS — J449 Chronic obstructive pulmonary disease, unspecified: Secondary | ICD-10-CM

## 2022-07-29 DIAGNOSIS — Z89511 Acquired absence of right leg below knee: Secondary | ICD-10-CM

## 2022-07-29 DIAGNOSIS — Z1211 Encounter for screening for malignant neoplasm of colon: Secondary | ICD-10-CM

## 2022-07-30 LAB — CBC WITH DIFFERENTIAL/PLATELET
Absolute Monocytes: 1070 cells/uL — ABNORMAL HIGH (ref 200–950)
Basophils Absolute: 128 cells/uL (ref 0–200)
Basophils Relative: 1.2 %
Eosinophils Absolute: 203 cells/uL (ref 15–500)
Eosinophils Relative: 1.9 %
HCT: 38.6 % (ref 38.5–50.0)
Hemoglobin: 13.1 g/dL — ABNORMAL LOW (ref 13.2–17.1)
Lymphs Abs: 1070 cells/uL (ref 850–3900)
MCH: 33 pg (ref 27.0–33.0)
MCHC: 33.9 g/dL (ref 32.0–36.0)
MCV: 97.2 fL (ref 80.0–100.0)
MPV: 9 fL (ref 7.5–12.5)
Monocytes Relative: 10 %
Neutro Abs: 8228 cells/uL — ABNORMAL HIGH (ref 1500–7800)
Neutrophils Relative %: 76.9 %
Platelets: 356 10*3/uL (ref 140–400)
RBC: 3.97 10*6/uL — ABNORMAL LOW (ref 4.20–5.80)
RDW: 13.3 % (ref 11.0–15.0)
Total Lymphocyte: 10 %
WBC: 10.7 10*3/uL (ref 3.8–10.8)

## 2022-07-30 LAB — COMPLETE METABOLIC PANEL WITH GFR
AG Ratio: 1.8 (calc) (ref 1.0–2.5)
ALT: 15 U/L (ref 9–46)
AST: 17 U/L (ref 10–35)
Albumin: 3.8 g/dL (ref 3.6–5.1)
Alkaline phosphatase (APISO): 60 U/L (ref 35–144)
BUN: 19 mg/dL (ref 7–25)
CO2: 29 mmol/L (ref 20–32)
Calcium: 9.7 mg/dL (ref 8.6–10.3)
Chloride: 104 mmol/L (ref 98–110)
Creat: 1.18 mg/dL (ref 0.70–1.22)
Globulin: 2.1 g/dL (calc) (ref 1.9–3.7)
Glucose, Bld: 85 mg/dL (ref 65–99)
Potassium: 4.9 mmol/L (ref 3.5–5.3)
Sodium: 141 mmol/L (ref 135–146)
Total Bilirubin: 0.5 mg/dL (ref 0.2–1.2)
Total Protein: 5.9 g/dL — ABNORMAL LOW (ref 6.1–8.1)
eGFR: 62 mL/min/{1.73_m2} (ref >=60)

## 2022-07-30 LAB — INSULIN, RANDOM: Insulin: 5.8 u[IU]/mL

## 2022-07-30 LAB — URINALYSIS, ROUTINE W REFLEX MICROSCOPIC
Bilirubin Urine: NEGATIVE
Glucose, UA: NEGATIVE
Hgb urine dipstick: NEGATIVE
Ketones, ur: NEGATIVE
Leukocytes,Ua: NEGATIVE
Nitrite: NEGATIVE
Protein, ur: NEGATIVE
Specific Gravity, Urine: 1.016 (ref 1.001–1.035)
pH: 5.5 (ref 5.0–8.0)

## 2022-07-30 LAB — PTH, INTACT AND CALCIUM
Calcium: 9.7 mg/dL (ref 8.6–10.3)
PTH: 52 pg/mL (ref 16–77)

## 2022-07-30 LAB — MICROALBUMIN / CREATININE URINE RATIO
Creatinine, Urine: 94 mg/dL (ref 20–320)
Microalb Creat Ratio: 3 mcg/mg creat (ref <30)
Microalb, Ur: 0.3 mg/dL

## 2022-07-30 LAB — LIPID PANEL
Cholesterol: 114 mg/dL (ref <200)
HDL: 66 mg/dL (ref >=40)
LDL Cholesterol (Calc): 32 mg/dL (calc)
Non-HDL Cholesterol (Calc): 48 mg/dL (calc) (ref <130)
Total CHOL/HDL Ratio: 1.7 (calc) (ref <5.0)
Triglycerides: 79 mg/dL (ref <150)

## 2022-07-30 LAB — PSA: PSA: 0.76 ng/mL (ref <=4.00)

## 2022-07-30 LAB — TSH: TSH: 2.15 mIU/L (ref 0.40–4.50)

## 2022-07-30 LAB — HEMOGLOBIN A1C
Hgb A1c MFr Bld: 6.1 % of total Hgb — ABNORMAL HIGH (ref <5.7)
Mean Plasma Glucose: 128 mg/dL
eAG (mmol/L): 7.1 mmol/L

## 2022-07-30 LAB — MAGNESIUM: Magnesium: 1.9 mg/dL (ref 1.5–2.5)

## 2022-07-30 LAB — VITAMIN D 25 HYDROXY (VIT D DEFICIENCY, FRACTURES): Vit D, 25-Hydroxy: 37 ng/mL (ref 30–100)

## 2022-08-02 NOTE — Progress Notes (Signed)
<><><><><><><><><><><><><><><><><><><><><><><><><><><><><><><><><> <><><><><><><><><><><><><><><><><><><><><><><><><><><><><><><><><>  -  A1c = 6.1% Blood sugar and A1c are STILL elevated in                            the borderline and  early or pre-diabetes range which has the same   300% increased risk for   heart attack, stroke, cancer and alzheimer- type vascular dementia                                                                                                     as full blown diabetes.   But the good news is that diet, exercise with weight loss can                                                                                cure the early diabetes at this point.   -  It is very important that you work harder with diet by                                avoiding all foods that are white except chicken, fish & calliflower.  - Avoid white rice  (brown & wild rice is OK),   - Avoid white potatoes  (sweet potatoes in moderation is OK),   White bread or wheat bread or anything made out of   white flour like bagels, donuts, rolls, buns, biscuits, cakes,  - pastries, cookies, pizza crust, and pasta (made from  white flour & egg whites)   - vegetarian pasta or spinach or wheat pasta is OK.  - Multigrain breads like Arnold's, Pepperidge Farm or                                multigrain sandwich thins or high fiber breads like                                                        Eureka bread or "Dave's Killer" breads that are                                                                             4 to 5 grams  fiber per slice !  are best.    Diet, exercise and weight loss can reverse and cure diabetes in the early stages.    - Diet, exercise and weight loss is very important in the   control and prevention of complications of diabetes which                                                                     affects every system in your body, ie.   -Brain -  dementia/stroke,  - eyes - glaucoma/blindness,  - heart - heart attack/heart failure,  - kidneys - dialysis,  - stomach - gastric paralysis,  - intestines - malabsorption,  - nerves - severe painful neuritis,  - circulation - gangrene & loss of a leg(s)  - and finally  . . . . . . . . . . . . . . . . . .   - cancer and Alzheimers.  <><><><><><><><><><><><><><><><><><><><><><><><><><><><><><><><><>  - PSA - is low - No Prostate Cancer - Great !  <><><><><><><><><><><><><><><><><><><><><><><><><><><><><><><><><>  -  Chol = 114   &   LDL = 32    -   Both EXCELLENT   & low risk for Heart Attack   -  Recommend decrease your Atorvastatin /Lipitor 80 mg to 1/2 tablet  (= 40 mg )  /day  <><><><><><><><><><><><><><><><><><><><><><><><><><><><><><><><><>  -  Vitamin D = 37  - is extremely & Dangerously Low   - Vitamin D goal is between 70-100.   - Please INCREASE  your Vitamin D 5,000 u  to 2 capsules = 10,000 units  /day   - It is very important as a natural anti-inflammatory and helping the                                immune system protect against viral infections, like the Covid-19    helping hair, skin, and nails, as well as reducing stroke and heart attack risk.   - It helps your bones and helps with mood.  - It also decreases numerous cancer risks so please   -     take it as directed.   - Low Vit D is associated with a 200-300% higher risk for CANCER   and 200-300% higher risk for HEART   ATTACK  &  STROKE.    - It is also associated with higher death rate at younger ages,   autoimmune diseases like Rheumatoid arthritis, Lupus, Multiple Sclerosis.     - Also many other serious conditions, like depression, Alzheimer's  Dementia, infertility, muscle aches, fatigue, fibromyalgia    <><><><><><><><><><><><><><><><><><><><><><><><><><><><><><><><><> <><><><><><><><><><><><><><><><><><><><><><><><><><><><><><><><><> <><><><><><><><><><><><><><><><><><><><><><><><><><><><><><><><><>  - All Else - CBC - Kidneys - Electrolytes - Liver - Magnesium & Thyroid    - all  Normal / OK  <><><><><><><><><><><><><><><><><><><><><><><><><><><><><><><><><> <><><><><><><><><><><><><><><><><><><><><><><><><><><><><><><><><>

## 2022-08-03 NOTE — Progress Notes (Signed)
Patient is aware of lab results and medication instructions. -e welch

## 2022-08-12 ENCOUNTER — Telehealth: Payer: Self-pay

## 2022-08-12 NOTE — Progress Notes (Signed)
Patient: Leslie Cardenas DOB: 01/13/1941   HC chart review started. Reviewing office visits, consults, hospital visits, labs and medication changes. Chart review complete. Calling pt. to complete general review call.  General Review (HC) Chart Review Have there been any documented new, changed, or discontinued medications since last visit?  (Include name, dose, frequency, date): 06/17/22 [place], NP - Start: Buspirone 5 mg - 0.5-1 tab 3 times daily 04/28/22 Cranford, NP - Change: Albuterol Sulfate -->108 (90 Base) MCG/ACT - 2 puffs every 4 hours as needed. Prednisone '5mg'$  - 1 tab once daily. Stop: Fexofenadine '180mg'$ , Rosuvastatin '20mg'$  Has there been any documented recent hospitalizations or ED visits since last visit with Clinical Lead?: Yes Brief Summary (including Medication and/or Diagnosis changes):: 06/11/22-06/12/22 [facility] DX: TIA Start: Aspirin '81mg'$  Atorvastatin '80mg'$  Stop: Acetaminophen '325mg'$  Adherence Review Does [facility] have access to medication refill data?: No Disease State Questions Able to connect with the Patient?: Yes Did patient have any problems with their health recently?: No Have you had any admissions or emergency room visits or worsening of your condition(s) since last visit?: Yes Details of ED visit, hospital visit and/or worsening condition(s):: 06/11/22-06/12/22 [facility] DX: TIA Have you had any visits with new specialists or providers since your last visit?: No Have you had any new health care problem(s) since your last visit?: Yes New problem(s) reported:: Stroke 06/10/22 Have you run out of any of your medications since you last spoke with your clinical lead?: No Are there any medications you are not taking as prescribed?: No Are you having any issues or side effects with your medications?: No Do you have any other health concerns or questions you want to discuss with your Clinical lead  before your next visit?: No Are there any health concerns that you  feel we can do a better job addressing?: No Any falls since last visit?: No Any increased or uncontrolled pain since last visit?: No Next visit Type:: Phone Visit with:: CP Date:: 10/13/2022 Time:: 3-5 PM  Total time spent: 30 min

## 2022-08-13 ENCOUNTER — Ambulatory Visit: Payer: Medicare HMO | Admitting: Neurology

## 2022-08-13 ENCOUNTER — Encounter: Payer: Self-pay | Admitting: Neurology

## 2022-08-13 VITALS — BP 137/77 | HR 73 | Ht 70.0 in | Wt 138.6 lb

## 2022-08-13 DIAGNOSIS — G459 Transient cerebral ischemic attack, unspecified: Secondary | ICD-10-CM | POA: Diagnosis not present

## 2022-08-13 MED ORDER — CLOPIDOGREL BISULFATE 75 MG PO TABS: 75.0000 mg | ORAL_TABLET | Freq: Every day | ORAL | 11 refills | Status: DC

## 2022-08-13 NOTE — Progress Notes (Signed)
[facility]  PATIENT: Leslie Cardenas DOB: 11-18-1940  REQUESTING CLINICIAN: Darrol Jump, NP HISTORY FROM: Patient, ex-wife and chart review  REASON FOR VISIT: TIA    HISTORICAL  CHIEF COMPLAINT:  Chief Complaint  Patient presents with   New Patient (Initial Visit)    Rm 17. Patient is with ex-wife. Patient has had no symptoms or changes since.     HISTORY OF PRESENT ILLNESS:  This is a 82 year old gentleman past medical history of COPD, coronary artery disease, hypertension, hyperlipidemia, grade 1 diastolic dysfunction, right BKA who is presenting after being admitted to the hospital for TIA.  Patient reports waking up with left facial numbness and left hand numbness, at that time he also reported he was feeling tired and went to lay down.  Symptoms lasted about an hour, and a half. When son found him in the house, he convinced patient to go to the ED.  In the ED patient was admitted for TIA workup, MRI negative for any acute stroke and CT angiogram did not show any large vessel occlusion but did show narrowing in the small intracranial arteries.  He was recommended DAPT, aspirin and Plavix for total of 3 months and to continue with Plavix alone.  Since discharge from the hospital he has not had any additional symptoms.  He reports that he is doing much better, he is compliant with his medication, joined the gym and start working out.  He has not had any recurrent symptoms.  He also increase his hydration.  Currently no concerns.   Hospital Summary  This 82 years old male with PMH significant for COPD, coronary artery disease, GERD, grade 1 diastolic dysfunction, hyperlipidemia presented to the ED with chief complaints of left-sided numbness. Patient reports his symptoms lasted about 90 minutes,  they are completely resolved by the time patient arrived in the ED.  Patient was evaluated by teleneurology recommended stroke workup.  MRI negative for acute stroke. LDL  143,  Started on Lipitor 80 mg daily,  target LDL goal below 70.  Echocardiogram is unremarkable.  Continued on aspirin and Lipitor.  Neurology is consulted.  CTA shows moderate to severe focal stenosis in the distal petrous/proximal cavernous segment of left MCA which has progressed compared to 2022.  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 Neurology has reviewed the imaging recommended patient can be discharged on aspirin and Plavix for 3 months followed by Plavix only therapy.  Patient should follow-up with neurology in 3 months.  Event monitor was prescribed at the time of discharge.  Patient is being discharged home.  His symptoms has resolved.   OTHER MEDICAL CONDITIONS: COPD, Hypertension, Hyperlipidemia, CAD< GERD, Grade 1 diastolic dysfunction, Hyperlipidemia    REVIEW OF SYSTEMS: Full 14 system review of systems performed and negative with exception of: As noted in the HPI   ALLERGIES: No Known Allergies  HOME MEDICATIONS: Outpatient Medications Prior to Visit  Medication Sig Dispense Refill   albuterol (VENTOLIN HFA) 108 (90 Base) MCG/ACT inhaler Inhale 2 puffs into the lungs every 4 (four) hours as needed for wheezing or shortness of breath. Make sure you rinse mouth out after each use. 18 g 2   aspirin EC 81 MG tablet Take 1 tablet (81 mg total) by mouth daily. Swallow whole. 90 tablet 0   atorvastatin (LIPITOR) 80 MG tablet Take 1 tablet (80 mg total) by mouth daily. 30 tablet 1   Budeson-Glycopyrrol-Formoterol (BREZTRI AEROSPHERE)  160-9-4.8 MCG/ACT AERO Inhale 2 puffs into the lungs in the morning and at bedtime. 1 each 11   ezetimibe (ZETIA) 10 MG tablet Take 1 tablet (10 mg total) by mouth daily. 30 tablet 1   fluticasone (FLONASE) 50 MCG/ACT nasal spray Place 1 spray into both nostrils daily.     guaiFENesin (MUCINEX) 600 MG 12 hr tablet Take 1 tablet (600 mg total) by mouth 2 (two) times daily. 60 tablet 3   loratadine  (CLARITIN) 10 MG tablet Take 10 mg by mouth daily.     Multiple Vitamin (MULTIVITAMIN) tablet Take 1 tablet by mouth daily.     nitroGLYCERIN (NITROSTAT) 0.4 MG SL tablet Place 0.4 mg under the tongue every 5 (five) minutes as needed for chest pain.     Respiratory Therapy Supplies (NEBULIZER/TUBING/MOUTHPIECE) KIT Disp one nebulizer machine, tubing set and mouthpiece kit for use 4 times daily 1 kit 0   Spacer/Aero-Holding Chambers DEVI 2 puffs by Does not apply route 2 (two) times daily. Please use with MDI inhalers 1 each 0   VITAMIN D PO Take 5,000 Units by mouth daily.     busPIRone (BUSPAR) 5 MG tablet Take     1/2 to 1 tablet     3 x /day      for Anxiety (Patient not taking: Reported on 08/13/2022) 270 tablet 0   neomycin-polymyxin b-dexamethasone (MAXITROL) 3.5-10000-0.1 OINT Place 1 Application into both eyes in the morning and at bedtime. (Patient not taking: Reported on 07/29/2022)     clopidogrel (PLAVIX) 75 MG tablet Take 1 tablet (75 mg total) by mouth daily. (Patient not taking: Reported on 08/13/2022) 30 tablet 2   No facility-administered medications prior to visit.    PAST MEDICAL HISTORY: Past Medical History:  Diagnosis Date   Aortic atherosclerosis ([place]) 03/04/2018   Noted on CT Abd/pelvis   Cataract    Bilateral   Colon polyp    COPD (chronic obstructive pulmonary disease) ([place])    Coronary artery disease    Diverticulosis 07/16/2016   SIGMOID, NOTED ON COLONOSCOPY   Esophageal stenosis 02/24/2018   noted on endoscopy   Esophageal stricture    GERD (gastroesophageal reflux disease)    Grade I diastolic dysfunction 0000000   noted on ECHO   History of hiatal hernia 02/24/2018   Small noted on endoscopy   Hydronephrosis    Hydronephrosis of right kidney 03/04/2018   URETERAL STONE 7 MM, NOTED ONJ CT ABD/PELVIS   Hyperlipidemia    Myocardial infarction [facility])    [id number]   Nephrolithiasis    s/p stent placement   Skin cancer    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 NOTED ON CT ABD/PELVIS   Vitamin D deficiency    Wears dentures    upper    PAST SURGICAL HISTORY: Past Surgical History:  Procedure Laterality Date   CARDIAC CATHETERIZATION  1983   No intervention performed.    CATARACT EXTRACTION, BILATERAL     COLONOSCOPY     COLONOSCOPY W/ POLYPECTOMY  08/12/2010   Dr. Silvano Rusk   COLONOSCOPY W/ POLYPECTOMY  07/16/2016   CYSTOSCOPY W/ URETERAL STENT PLACEMENT Right 03/04/2018   Procedure: CYSTOSCOPY WITH RETROGRADE PYELOGRAM/URETERAL STENT PLACEMENT;  Surgeon: [name], MD;  Location: [place];  Service: Urology;  Laterality: Right;   CYSTOSCOPY/URETEROSCOPY/HOLMIUM LASER/STENT PLACEMENT Right 03/16/2018   Procedure: CYSTOSCOPY RIGHT URETEROSCOPY/HOLMIUM LASER/STENT PLACEMENT;  Surgeon: Marton Redwood III,  MD;  Location: [place];  Service: Urology;  Laterality: Right;   ESOPHAGOGASTRODUODENOSCOPY     INGUINAL HERNIA REPAIR     left   right leg amputation  05/14/1977   SHOULDER ARTHROSCOPY Left 2000   UPPER GASTROINTESTINAL ENDOSCOPY  WITH DILATION  02/24/2018    FAMILY HISTORY: Family History  Problem Relation Age of Onset   Hypertension Father    Pancreatic cancer Mother    Cancer Mother    Colon cancer Brother    Leukemia Brother    Prostate cancer Brother    Other Brother        polio   Esophageal cancer Neg Hx    Rectal cancer Neg Hx    Stomach cancer Neg Hx     SOCIAL HISTORY: Social History   Socioeconomic History   Marital status: Divorced    Spouse name: Not on file   Number of children: 4   Years of education: Not on file   Highest education level: 11th grade  Occupational History   Occupation: retired    Fish farm manager: RETIRED  Tobacco Use   Smoking status: Former    Packs/day: 3.00    Years: 20.00    Total pack years: 60.00    Types: Cigarettes    Quit date: 04/11/2000    Years since quitting: 22.3   Smokeless tobacco:  Never  Vaping Use   Vaping Use: Never used  Substance and Sexual Activity   Alcohol use: Not Currently    Comment: rare   Drug use: No   Sexual activity: Not Currently  Other Topics Concern   Not on file  [employer] 4 yrs + 2 [employer]   [employer] 62-65 then to [employer]   Has 4 children he is divorced   Former smoker rare alcohol no drugs   Lives in [place], [place].    Social Determinants of Health   Financial Resource Strain: Not on file  Food Insecurity: Not on file  Transportation Needs: Not on file  Physical Activity: Not on file  Stress: Not on file  Social Connections: Not on file  Intimate Partner Violence: Not on file    PHYSICAL EXAM  GENERAL EXAM/CONSTITUTIONAL: Vitals:  Vitals:   08/13/22 1522  BP: 137/77  Pulse: 73  Weight: 138 lb 9.6 oz (62.9 kg)  Height: '5\' 10"'$  (1.778 m)   Body mass index is 19.89 kg/m. Wt Readings from Last 3 Encounters:  08/13/22 138 lb 9.6 oz (62.9 kg)  07/29/22 138 lb 6.4 oz (62.8 kg)  07/20/22 136 lb 9.6 oz (62 kg)   Patient is in no distress; well developed, nourished and groomed; neck is supple  EYES: Visual fields full to confrontation, Extraocular movements intacts,   MUSCULOSKELETAL: Gait, strength, tone, movements noted in Neurologic exam below  NEUROLOGIC: MENTAL STATUS:     08/29/2019    3:50 PM  MMSE - Mini Mental State Exam  Orientation to time 5  Orientation to Place 5  Registration 3  Attention/ Calculation 5  Recall 3  Language- name 2 objects 2  Language- repeat 1  Language- follow 3 step command 3  Language- read & follow direction 1  Write a sentence 1  Copy design 1  Total score 30   awake, alert, oriented to person, place and time recent and remote memory intact normal attention and concentration language fluent, comprehension intact, naming intact fund of knowledge appropriate  CRANIAL NERVE:  2nd,  3rd, 4th, 6th - Visual fields full to confrontation,  extraocular muscles intact, no nystagmus 5th - facial sensation symmetric 7th - facial strength symmetric 8th - hearing intact 9th - palate elevates symmetrically, uvula midline 11th - shoulder shrug symmetric 12th - tongue protrusion midline  MOTOR:  normal bulk and tone, full strength in the BUE, BLE  SENSORY:  normal and symmetric to light touch  COORDINATION:  finger-nose-finger, fine finger movements normal  REFLEXES:  deep tendon reflexes present and symmetric  GAIT/STATION:  normal   DIAGNOSTIC DATA (LABS, IMAGING, TESTING) - I reviewed patient records, labs, notes, testing and imaging myself where available.  Lab Results  Component Value Date   WBC 10.7 07/29/2022   HGB 13.1 (L) 07/29/2022   HCT 38.6 07/29/2022   MCV 97.2 07/29/2022   PLT 356 07/29/2022      Component Value Date/Time   NA 141 07/29/2022 0956   K 4.9 07/29/2022 0956   CL 104 07/29/2022 0956   CO2 29 07/29/2022 0956   GLUCOSE 85 07/29/2022 0956   BUN 19 07/29/2022 0956   CREATININE 1.18 07/29/2022 0956   CALCIUM 9.7 07/29/2022 0956   CALCIUM 9.7 07/29/2022 0956   PROT 5.9 (L) 07/29/2022 0956   ALBUMIN 3.4 (L) 06/11/2022 1430   AST 17 07/29/2022 0956   ALT 15 07/29/2022 0956   ALKPHOS 55 06/11/2022 1430   BILITOT 0.5 07/29/2022 0956   GFRNONAA >60 06/11/2022 1430   GFRNONAA 62 12/05/2020 1118   GFRAA 72 12/05/2020 1118   Lab Results  Component Value Date   CHOL 114 07/29/2022   HDL 66 07/29/2022   LDLCALC 32 07/29/2022   TRIG 79 07/29/2022   CHOLHDL 1.7 07/29/2022   Lab Results  Component Value Date   HGBA1C 6.1 (H) 07/29/2022   Lab Results  Component Value Date   VITAMINB12 405 07/29/2018   Lab Results  Component Value Date   TSH 2.15 07/29/2022    MRI Brain 06/11/2022 No acute intracranial abnormality.   Noncontrast CT head: 1. No hemorrhage or CT evidence of an acute infarct.   CT Angiogram Anterior circulation: 1. Moderate to severe focal stenosis in the  distal petrous/proximal cavernous segment of the left MCA, which has progressed compared to 2022. 2. Moderate narrowing in the distal M1 segment of the left MCA in the inferior division of the left MCA, and at least mild narrowing at the M1 M2 junction of the right MCA.   Posterior circulation 1. Moderate stenosis in the proximal to mid segment of the basilar artery, new from prior exam. 2. Moderate to severe focal stenosis of the P2 segment of the left PCA, new from prior exam. 3. V1 segment of the right vertebral artery is poorly visualized due to streak artifact from venous contrast material, but there may be at least moderate stenosis in the proximal V1 segment.  Echocardiogram  1. Left ventricular ejection fraction, by estimation, is 60 to 65%. The left ventricle has normal function. The left ventricle has no regional wall motion abnormalities. There is mild left ventricular hypertrophy. Left ventricular diastolic parameters were normal.  2. Right ventricular systolic function is normal. The right ventricular size is normal. Tricuspid regurgitation signal is inadequate for assessing PA pressure.  3. The mitral valve is grossly normal. No evidence of mitral valve regurgitation.  4. The aortic valve is tricuspid. Aortic valve regurgitation is mild. Aortic valve sclerosis/calcification is present, without any evidence of aortic stenosis.  5. The inferior vena cava is  normal in size with greater than 50% respiratory variability, suggesting right atrial pressure of 3 mmHg.    ASSESSMENT AND PLAN  82 y.o. year old male with vascular risk factors including hypertension, hyperlipidemia, CAD, who is presenting after 1-1/2-hour episode of numbness of the left face and left arm consistent with TIA.  MRI negative for any acute stroke.  He is currently on DAPT, aspirin and Plavix and plan is for patient to continue DAPT for total of 24-month and to continue with Plavix thereafter.  He is on Lipitor and  Zetia and there is improvement of his LDL.  At the moment I have advised patient to continue his current medications, to increase exercise and hydration.  We also discussed good sleep routine and good diet.  I will see him in 1 year for follow-up or sooner if worse.  He voiced understanding.   1. TIA (transient ischemic attack)      Patient Instructions  Continue current medications including aspirin Plavix for total of 3 months Starting April 5 discontinue Aspirin and continue with Plavix Continue with physical activity Call 911 if you do have another event similar, any numbness or weakness Follow-up in 1 year or sooner if worse    No orders of the defined types were placed in this encounter.   Meds ordered this encounter  Medications   clopidogrel (PLAVIX) 75 MG tablet    Sig: Take 1 tablet (75 mg total) by mouth daily.    Dispense:  30 tablet    Refill:  11    Return in about 1 year (around 08/13/2023).    AAlric Ran MD 08/13/2022, 4:15 PM  [facility] [address] [place] [phone]

## 2022-08-13 NOTE — Patient Instructions (Signed)
Continue current medications including aspirin Plavix for total of 3 months Starting April 5 discontinue Aspirin and continue with Plavix Continue with physical activity Call 911 if you do have another event similar, any numbness or weakness Follow-up in 1 year or sooner if worse

## 2022-09-01 ENCOUNTER — Other Ambulatory Visit: Payer: Self-pay

## 2022-09-01 DIAGNOSIS — E782 Mixed hyperlipidemia: Secondary | ICD-10-CM

## 2022-09-01 MED ORDER — ATORVASTATIN CALCIUM 80 MG PO TABS: 80.0000 mg | ORAL_TABLET | Freq: Every day | ORAL | 1 refills | Status: DC

## 2022-09-10 ENCOUNTER — Telehealth: Payer: Self-pay

## 2022-09-10 DIAGNOSIS — J3489 Other specified disorders of nose and nasal sinuses: Secondary | ICD-10-CM | POA: Diagnosis not present

## 2022-09-10 DIAGNOSIS — J342 Deviated nasal septum: Secondary | ICD-10-CM | POA: Diagnosis not present

## 2022-09-10 DIAGNOSIS — M950 Acquired deformity of nose: Secondary | ICD-10-CM | POA: Diagnosis not present

## 2022-09-10 DIAGNOSIS — J34829 Nasal valve collapse, unspecified: Secondary | ICD-10-CM | POA: Insufficient documentation

## 2022-09-10 NOTE — Progress Notes (Signed)
Patient: Leslie Cardenas DOB: 03/25/1941  Per CRN, HC to call pt to follow up on whether patient has had any TIA symptoms. HC connected with pt and he is doing well. Pt is adhering to his medication regimen of Plavix daily. HC informed pt to call 911 (symptoms of numbness or weakness, slurred speech, blurred or lost vision, increased mental confusion) Pt states understanding and agrees. He is also taking Aspirin but stop date is 09/11/22, pt is aware and plans to stop medication tomorrow.  Total time spent: 6min

## 2022-09-12 ENCOUNTER — Other Ambulatory Visit: Payer: Self-pay | Admitting: Nurse Practitioner

## 2022-09-18 ENCOUNTER — Other Ambulatory Visit: Payer: Self-pay | Admitting: Student

## 2022-09-18 DIAGNOSIS — J301 Allergic rhinitis due to pollen: Secondary | ICD-10-CM

## 2022-09-24 ENCOUNTER — Other Ambulatory Visit: Payer: Self-pay | Admitting: Internal Medicine

## 2022-09-24 DIAGNOSIS — E782 Mixed hyperlipidemia: Secondary | ICD-10-CM

## 2022-09-30 ENCOUNTER — Encounter: Payer: Self-pay | Admitting: Nurse Practitioner

## 2022-09-30 ENCOUNTER — Ambulatory Visit (INDEPENDENT_AMBULATORY_CARE_PROVIDER_SITE_OTHER): Payer: Medicare HMO | Admitting: Nurse Practitioner

## 2022-09-30 VITALS — BP 130/80 | HR 96 | Temp 97.9°F | Ht 68.0 in | Wt 136.4 lb

## 2022-09-30 DIAGNOSIS — R0609 Other forms of dyspnea: Secondary | ICD-10-CM | POA: Diagnosis not present

## 2022-09-30 DIAGNOSIS — J449 Chronic obstructive pulmonary disease, unspecified: Secondary | ICD-10-CM

## 2022-09-30 DIAGNOSIS — Z79899 Other long term (current) drug therapy: Secondary | ICD-10-CM

## 2022-09-30 NOTE — Progress Notes (Signed)
Assessment and Plan:  Leslie Cardenas was seen today for an episodic visit.  Diagnoses and all order for this visit:  Stage 3 severe COPD by GOLD classification Oxygen ordered for PM/HS use. Continue Albuterol, Breztri, Flonase, Mucinex, Claritin, Prednisone Continue to follow with Pulmonology every 6 mo last seen 07/2022.  - For home use only DME oxygen  DOE (dyspnea on exertion)  - For home use only DME oxygen  Medication management Pulmonology following No medication changes at this time. All medications discussed and reviewed in full. All questions and concerns regarding medications addressed.     Notify office for further evaluation and treatment, questions or concerns if s/s fail to improve. The risks and benefits of my recommendations, as well as other treatment options were discussed with the patient today. Questions were answered.  Further disposition pending results of labs. Discussed med's effects and SE's.    Over 20 minutes of exam, counseling, chart review, and critical decision making was performed.   Future Appointments  Date Time Provider Department Center  11/26/2022  9:30 AM Raynelle Dick, NP [facility] None  03/01/2023  9:30 AM Lucky Cowboy, MD [facility] None  05/31/2023 10:30 AM Raynelle Dick, NP [facility] None  08/12/2023  3:45 PM Windell Norfolk, MD [facility] None  08/30/2023 10:00 AM Lucky Cowboy, MD [facility] None    ------------------------------------------------------------------------------------------------------------------   HPI BP 130/80   Pulse 96   Temp 97.9 F (36.6 C)   Ht  (1.727 m)   Wt 136 lb 6.4 oz (61.9 kg)   SpO2 96%   BMI 20.74 kg/m   82 y.o.male presents along side ex-girlfriend who currently oversees his medication needs, for evaluation of in home oxygen use at night when sleeping.    He follows with Pulmonology, Dr. 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  During that time his breathing had  decreased for several weeks d/t URI.  He has been using Breztri, chronic prednisone, mucinex, flonase zyrtec.    PFT 2014 reviewed with severe obstruction, moderately reduced dlco, ++BD response.  ONO 06/11/21 saturation low was 88% noted from encounter note.  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  He was asked to return if 3 months if needed.    States that over the last week he was visiting a friend who had home oxygen and he used while sleeping.  Does not remember the L amount he was using however, reports adequate and restful sleep during use.  He continues to have DOE.  Reports medication compliance.  Past Medical History:  Diagnosis Date   Aortic atherosclerosis 03/04/2018   Noted on CT Abd/pelvis   Cataract    Bilateral   Colon polyp    COPD (chronic obstructive pulmonary disease)    Coronary artery disease    Diverticulosis 07/16/2016   SIGMOID, NOTED ON COLONOSCOPY   Esophageal stenosis 02/24/2018   noted on endoscopy   Esophageal stricture    GERD (gastroesophageal reflux disease)    Grade I diastolic dysfunction 08/09/2017   noted on ECHO   History of hiatal hernia 02/24/2018   Small noted on endoscopy   Hydronephrosis    Hydronephrosis of right kidney 03/04/2018   URETERAL STONE 7 MM, NOTED ONJ CT ABD/PELVIS   Hyperlipidemia    Myocardial infarction    MI-1983   Nephrolithiasis    s/p stent placement   Skin cancer    Traumatic amputation of right leg    Ureteral stone  03/04/2018   URETERAL STONE 7 MM. NOTED ON CT ABD/PELVIS   Vitamin D deficiency    Wears dentures    upper     No Known Allergies  Current Outpatient Medications on File Prior to Visit  Medication Sig   albuterol (VENTOLIN HFA) 108 (90 Base) MCG/ACT inhaler Inhale 2 puffs into the lungs every 4 (four) hours as needed for wheezing or shortness of breath. Make sure you rinse mouth out after each use.   atorvastatin (LIPITOR) 80 MG tablet  TAKE 1 TABLET BY MOUTH EVERY DAY   Budeson-Glycopyrrol-Formoterol (BREZTRI AEROSPHERE) 160-9-4.8 MCG/ACT AERO Inhale 2 puffs into the lungs in the morning and at bedtime.   busPIRone (BUSPAR) 5 MG tablet TAKE 1/2 TO 1 TABLET 3 X /DAY FOR ANXIETY   clopidogrel (PLAVIX) 75 MG tablet Take 1 tablet (75 mg total) by mouth daily.   ezetimibe (ZETIA) 10 MG tablet Take 1 tablet (10 mg total) by mouth daily.   fluticasone (FLONASE) 50 MCG/ACT nasal spray Place 1 spray into both nostrils daily.   guaiFENesin (MUCINEX) 600 MG 12 hr tablet Take 1 tablet (600 mg total) by mouth 2 (two) times daily.   Multiple Vitamin (MULTIVITAMIN) tablet Take 1 tablet by mouth daily.   neomycin-polymyxin b-dexamethasone (MAXITROL) 3.5-10000-0.1 OINT Place 1 Application into both eyes in the morning and at bedtime.   nitroGLYCERIN (NITROSTAT) 0.4 MG SL tablet Place 0.4 mg under the tongue every 5 (five) minutes as needed for chest pain.   predniSONE (DELTASONE) 5 MG tablet Take 5 mg by mouth 2 (two) times daily with a meal.   Respiratory Therapy Supplies (NEBULIZER/TUBING/MOUTHPIECE) KIT Disp one nebulizer machine, tubing set and mouthpiece kit for use 4 times daily   Spacer/Aero-Holding Chambers DEVI 2 puffs by Does not apply route 2 (two) times daily. Please use with MDI inhalers   VITAMIN D PO Take 5,000 Units by mouth daily.   aspirin EC 81 MG tablet Take 1 tablet (81 mg total) by mouth daily. Swallow whole. (Patient not taking: Reported on 09/30/2022)   loratadine (CLARITIN) 10 MG tablet TAKE ONE PILL ONCE DAILY AS NEEDED FOR ITCHING (Patient not taking: Reported on 09/30/2022)   No current facility-administered medications on file prior to visit.    ROS: all negative except what is noted in the HPI.   Physical Exam:  BP 130/80   Pulse 96   Temp 97.9 F (36.6 C)   Ht  (1.727 m)   Wt 136 lb 6.4 oz (61.9 kg)   SpO2 96%   BMI 20.74 kg/m   General Appearance: NAD.  Awake, conversant and cooperative. Eyes:  PERRLA, EOMs intact.  Sclera white.  Conjunctiva without erythema. Sinuses: No frontal/maxillary tenderness.  No nasal discharge. Nares patent.  ENT/Mouth: Ext aud canals clear.  Bilateral TMs w/DOL and without erythema or bulging. Hearing intact.  Posterior pharynx without swelling or exudate.  Tonsils without swelling or erythema.  Neck: Supple.  No masses, nodules or thyromegaly. Respiratory: Effort is regular with non-labored breathing. Breath sounds are equal bilaterally without rales, rhonchi, wheezing or stridor.  Cardio: RRR with no MRGs. Brisk peripheral pulses without edema.  Abdomen: Active BS in all four quadrants.  Soft and non-tender without guarding, rebound tenderness, hernias or masses. Lymphatics: Non tender without lymphadenopathy.  Musculoskeletal: Full ROM, 5/5 strength, normal ambulation.  No clubbing or cyanosis. Skin: Appropriate color for ethnicity. Warm without rashes, lesions, ecchymosis, ulcers.  Neuro: CN II-XII grossly normal. Normal muscle tone without cerebellar symptoms  and intact sensation.   Psych: AO X 3,  appropriate mood and affect, insight and judgment.     Adela Glimpse, NP 9:26 PM [facility]

## 2022-09-30 NOTE — Patient Instructions (Signed)
Chronic Obstructive Pulmonary Disease  Chronic obstructive pulmonary disease (COPD) is a long-term (chronic) lung problem. When you have COPD, it is hard for air to get in and out of your lungs. Usually the condition gets worse over time, and your lungs will never return to normal. There are things you can do to keep yourself as healthy as possible. What are the causes? Smoking. This is the most common cause. Certain genes passed from parent to child (inherited). What increases the risk? Being exposed to secondhand smoke from cigarettes, pipes, or cigars. Being exposed to chemicals and other irritants, such as fumes and dust in the work environment. Having chronic lung conditions or infections. What are the signs or symptoms? Shortness of breath, especially during physical activity. A long-term cough with a large amount of thick mucus. Sometimes, the cough may not have any mucus (dry cough). Wheezing. Breathing quickly. Skin that looks gray or blue, especially in the fingers, toes, or lips. Feeling tired (fatigue). Weight loss. Chest tightness. Having infections often. Episodes when breathing symptoms become much worse (exacerbations). At the later stages of this disease, you may have swelling in the ankles, feet, or legs. How is this treated? Taking medicines. Quitting smoking, if you smoke. Rehabilitation. This includes steps to make your body work better. It may involve a team of specialists. Doing exercises. Making changes to your diet. Using oxygen. Lung surgery. Lung transplant. Comfort measures (palliative care). Follow these instructions at home: Medicines Take over-the-counter and prescription medicines only as told by your doctor. Talk to your doctor before taking any cough or allergy medicines. You may need to avoid medicines that cause your lungs to be dry. Lifestyle If you smoke, stop smoking. Smoking makes the problem worse. Do not smoke or use any products that  contain nicotine or tobacco. If you need help quitting, ask your doctor. Avoid being around things that make your breathing worse. This may include smoke, chemicals, and fumes. Stay active, but remember to rest as well. Learn and use tips on how to manage stress and control your breathing. Make sure you get enough sleep. Most adults need at least 7 hours of sleep every night. Eat healthy foods. Eat smaller meals more often. Rest before meals. Controlled breathing Learn and use tips on how to control your breathing as told by your doctor. Try: Breathing in (inhaling) through your nose for 1 second. Then, pucker your lips and breath out (exhale) through your lips for 2 seconds. Putting one hand on your belly (abdomen). Breathe in slowly through your nose for 1 second. Your hand on your belly should move out. Pucker your lips and breathe out slowly through your lips. Your hand on your belly should move in as you breathe out.  Controlled coughing Learn and use controlled coughing to clear mucus from your lungs. Follow these steps: Lean your head a little forward. Breathe in deeply. Try to hold your breath for 3 seconds. Keep your mouth slightly open while coughing 2 times. Spit any mucus out into a tissue. Rest and do the steps again 1 or 2 times as needed. General instructions Make sure you get all the shots (vaccines) that your doctor recommends. Ask your doctor about a flu shot and a pneumonia shot. Use oxygen therapy and pulmonary rehabilitation if told by your doctor. If you need home oxygen therapy, ask your doctor if you should buy a tool to measure your oxygen level (oximeter). Make a COPD action plan with your doctor. This helps you   to know what to do if you feel worse than usual. Manage any other conditions you have as told by your doctor. Avoid going outside when it is very hot, cold, or humid. Avoid people who have a sickness you can catch (contagious). Keep all follow-up  visits. Contact a doctor if: You cough up more mucus than usual. There is a change in the color or thickness of the mucus. It is harder to breathe than usual. Your breathing is faster than usual. You have trouble sleeping. You need to use your medicines more often than usual. You have trouble doing your normal activities such as getting dressed or walking around the house. Get help right away if: You have shortness of breath while resting. You have shortness of breath that stops you from: Being able to talk. Doing normal activities. Your chest hurts for longer than 5 minutes. Your skin color is more blue than usual. Your pulse oximeter shows that you have low oxygen for longer than 5 minutes. You have a fever. You feel too tired to breathe normally. These symptoms may represent a serious problem that is an emergency. Do not wait to see if the symptoms will go away. Get medical help right away. Call your local emergency services (911 in the [place] Do not drive yourself to the hospital. Summary Chronic obstructive pulmonary disease (COPD) is a long-term lung problem. The way your lungs work will never return to normal. Usually the condition gets worse over time. There are things you can do to keep yourself as healthy as possible. Take over-the-counter and prescription medicines only as told by your doctor. If you smoke, stop. Smoking makes the problem worse. This information is not intended to replace advice given to you by your health care provider. Make sure you discuss any questions you have with your health care provider. Document Revised: 04/02/2020 Document Reviewed: 04/02/2020 Elsevier Patient Education  2023 [employer] Inc.  

## 2022-10-05 ENCOUNTER — Other Ambulatory Visit: Payer: Self-pay | Admitting: Nurse Practitioner

## 2022-10-05 DIAGNOSIS — R0609 Other forms of dyspnea: Secondary | ICD-10-CM

## 2022-10-05 DIAGNOSIS — Z87891 Personal history of nicotine dependence: Secondary | ICD-10-CM

## 2022-10-05 DIAGNOSIS — J449 Chronic obstructive pulmonary disease, unspecified: Secondary | ICD-10-CM

## 2022-10-06 ENCOUNTER — Telehealth: Payer: Self-pay | Admitting: Neurology

## 2022-10-06 MED ORDER — CLOPIDOGREL BISULFATE 75 MG PO TABS: 75.0000 mg | ORAL_TABLET | Freq: Every day | ORAL | 11 refills | Status: DC

## 2022-10-06 NOTE — Progress Notes (Unsigned)
Assessment and Plan:  There are no diagnoses linked to this encounter.    Further disposition pending results of labs. Discussed med's effects and SE's.   Over 30 minutes of exam, counseling, chart review, and critical decision making was performed.   Future Appointments  Date Time Provider Department Center  10/07/2022  4:00 PM Raynelle Dick, NP [facility] None  11/26/2022  9:30 AM Raynelle Dick, NP [facility] None  03/01/2023  9:30 AM Lucky Cowboy, MD [facility] None  05/31/2023 10:30 AM Raynelle Dick, NP [facility] None  08/12/2023  3:45 PM Windell Norfolk, MD [facility] None  08/30/2023 10:00 AM Lucky Cowboy, MD [facility] None    ------------------------------------------------------------------------------------------------------------------   HPI There were no vitals taken for this visit. 82 y.o.male presents for  Past Medical History:  Diagnosis Date   Aortic atherosclerosis (HCC) 03/04/2018   Noted on CT Abd/pelvis   Cataract    Bilateral   Colon polyp    COPD (chronic obstructive pulmonary disease) (HCC)    Coronary artery disease    Diverticulosis 07/16/2016   SIGMOID, NOTED ON COLONOSCOPY   Esophageal stenosis 02/24/2018   noted on endoscopy   Esophageal stricture    GERD (gastroesophageal reflux disease)    Grade I diastolic dysfunction 08/09/2017   noted on ECHO   History of hiatal hernia 02/24/2018   Small noted on endoscopy   Hydronephrosis    Hydronephrosis of right kidney 03/04/2018   URETERAL STONE 7 MM, NOTED ONJ CT ABD/PELVIS   Hyperlipidemia    Myocardial infarction [facility])    [id number]   Nephrolithiasis    s/p stent placement   Skin cancer    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 NOTED ON CT ABD/PELVIS   Vitamin D deficiency    Wears dentures    upper     No Known Allergies  Current Outpatient Medications on File Prior to Visit  Medication Sig   albuterol (VENTOLIN HFA)  108 (90 Base) MCG/ACT inhaler Inhale 2 puffs into the lungs every 4 (four) hours as needed for wheezing or shortness of breath. Make sure you rinse mouth out after each use.   aspirin EC 81 MG tablet Take 1 tablet (81 mg total) by mouth daily. Swallow whole. (Patient not taking: Reported on 09/30/2022)   atorvastatin (LIPITOR) 80 MG tablet TAKE 1 TABLET BY MOUTH EVERY DAY   Budeson-Glycopyrrol-Formoterol (BREZTRI AEROSPHERE) 160-9-4.8 MCG/ACT AERO Inhale 2 puffs into the lungs in the morning and at bedtime.   busPIRone (BUSPAR) 5 MG tablet TAKE 1/2 TO 1 TABLET 3 X /DAY FOR ANXIETY   clopidogrel (PLAVIX) 75 MG tablet Take 1 tablet (75 mg total) by mouth daily.   ezetimibe (ZETIA) 10 MG tablet Take 1 tablet (10 mg total) by mouth daily.   fluticasone (FLONASE) 50 MCG/ACT nasal spray Place 1 spray into both nostrils daily.   guaiFENesin (MUCINEX) 600 MG 12 hr tablet Take 1 tablet (600 mg total) by mouth 2 (two) times daily.   loratadine (CLARITIN) 10 MG tablet TAKE ONE PILL ONCE DAILY AS NEEDED FOR ITCHING (Patient not taking: Reported on 09/30/2022)   Multiple Vitamin (MULTIVITAMIN) tablet Take 1 tablet by mouth daily.   neomycin-polymyxin b-dexamethasone (MAXITROL) 3.5-10000-0.1 OINT Place 1 Application into both eyes in the morning and at bedtime.   nitroGLYCERIN (NITROSTAT) 0.4 MG SL tablet Place 0.4 mg under the tongue every 5 (five) minutes as needed for chest pain.   predniSONE (  DELTASONE) 5 MG tablet Take 5 mg by mouth 2 (two) times daily with a meal.   Respiratory Therapy Supplies (NEBULIZER/TUBING/MOUTHPIECE) KIT Disp one nebulizer machine, tubing set and mouthpiece kit for use 4 times daily   Spacer/Aero-Holding Chambers DEVI 2 puffs by Does not apply route 2 (two) times daily. Please use with [facility]I inhalers   VITAMIN D PO Take 5,000 Units by mouth daily.   No current facility-administered medications on file prior to visit.    ROS: all negative except above.   Physical Exam:  There were  no vitals taken for this visit.  General Appearance: Well nourished, in no apparent distress. Eyes: PERRLA, EOMs, conjunctiva no swelling or erythema Sinuses: No Frontal/maxillary tenderness ENT/Mouth: Ext aud canals clear, TMs without erythema, bulging. No erythema, swelling, or exudate on post pharynx.  Tonsils not swollen or erythematous. Hearing normal.  Neck: Supple, thyroid normal.  Respiratory: Respiratory effort normal, BS equal bilaterally without rales, rhonchi, wheezing or stridor.  Cardio: RRR with no MRGs. Brisk peripheral pulses without edema.  Abdomen: Soft, + BS.  Non tender, no guarding, rebound, hernias, masses. Lymphatics: Non tender without lymphadenopathy.  Musculoskeletal: Full ROM, 5/5 strength, normal gait.  Skin: Warm, dry without rashes, lesions, ecchymosis.  Neuro: Cranial nerves intact. Normal muscle tone, no cerebellar symptoms. Sensation intact.  Psych: Awake and oriented X 3, normal affect, Insight and Judgment appropriate.     Raynelle Dick, NP 3:07 PM [facility]

## 2022-10-06 NOTE — Telephone Encounter (Signed)
Called spoke to anita who stated refill of plavix neeeded and sent. I found her and found anita under:

## 2022-10-06 NOTE — Telephone Encounter (Signed)
Synetta Fail called( I couldn't find her name on pt DPR). Stated she needs to know what medication pt is taking. She is requesting a call back from nurse.

## 2022-10-07 ENCOUNTER — Ambulatory Visit (INDEPENDENT_AMBULATORY_CARE_PROVIDER_SITE_OTHER): Payer: Medicare HMO | Admitting: Nurse Practitioner

## 2022-10-07 ENCOUNTER — Encounter: Payer: Self-pay | Admitting: Nurse Practitioner

## 2022-10-07 VITALS — BP 148/72 | HR 81 | Temp 97.7°F | Ht 68.0 in | Wt 136.4 lb

## 2022-10-07 DIAGNOSIS — J449 Chronic obstructive pulmonary disease, unspecified: Secondary | ICD-10-CM

## 2022-10-07 DIAGNOSIS — J40 Bronchitis, not specified as acute or chronic: Secondary | ICD-10-CM | POA: Diagnosis not present

## 2022-10-07 DIAGNOSIS — I10 Essential (primary) hypertension: Secondary | ICD-10-CM | POA: Diagnosis not present

## 2022-10-07 MED ORDER — PREDNISONE 20 MG PO TABS: ORAL_TABLET | ORAL | 0 refills | Status: AC

## 2022-10-07 MED ORDER — PROMETHAZINE-DM 6.25-15 MG/5ML PO SYRP: 5.0000 mL | ORAL_SOLUTION | Freq: Four times a day (QID) | ORAL | 1 refills | Status: DC | PRN

## 2022-10-07 MED ORDER — IPRATROPIUM-ALBUTEROL 0.5-2.5 (3) MG/3ML IN SOLN: 3.0000 mL | RESPIRATORY_TRACT | 0 refills | Status: DC | PRN

## 2022-10-07 MED ORDER — AMOXICILLIN-POT CLAVULANATE 875-125 MG PO TABS: 1.0000 | ORAL_TABLET | Freq: Two times a day (BID) | ORAL | 0 refills | Status: DC

## 2022-10-07 NOTE — Patient Instructions (Addendum)
Chest xray has been ordered at [facility] [address] [place] In M-F 8:30-3:45  Claritin and flonase daily, Mucinex as needed  Duo neb through nebulizer every 4 hours as needed for shortness of breath/cough  Augmentin 875/125 mg twice a day for 7 days  Prednisone 20 mg 3 tabs x 3 days, 2 tabs x 3 days and 1 tab x 5 days  Promethazine DM cough syrup every 6 hours as needed- will make you sleepy  If no improvement, spike fever or pulse oxygen level will not stay above 90% go to the er    Acute Bronchitis, Adult  Acute bronchitis is when air tubes in the lungs (bronchi) suddenly get swollen. The condition can make it hard for you to breathe. In adults, acute bronchitis usually goes away within 2 weeks. A cough caused by bronchitis may last up to 3 weeks. Smoking, allergies, and asthma can make the condition worse. What are the causes? Germs that cause cold and flu (viruses). The most common cause of this condition is the virus that causes the common cold. Bacteria. Substances that bother (irritate) the lungs, including: Smoke from cigarettes and other types of tobacco. Dust and pollen. Fumes from chemicals, gases, or burned fuel. Indoor or outdoor air pollution. What increases the risk? A weak body's defense system. This is also called the immune system. Any condition that affects your lungs and breathing, such as asthma. What are the signs or symptoms? A cough. Coughing up clear, yellow, or green mucus. Making high-pitched whistling sounds when you breathe, most often when you breathe out (wheezing). Runny or stuffy nose. Having too much mucus in your lungs (chest congestion). Shortness of breath. Body aches. A sore throat. How is this treated? Acute bronchitis may go away over time without treatment. Your doctor may tell you to: Drink more fluids. This will help thin your mucus so it is easier to cough up. Use a device that gets medicine into your lungs  (inhaler). Use a vaporizer or a humidifier. These are machines that add water to the air. This helps with coughing and poor breathing. Take a medicine that thins mucus and helps clear it from your lungs. Take a medicine that prevents or stops coughing. It is not common to take an antibiotic medicine for this condition. Follow these instructions at home:  Take over-the-counter and prescription medicines only as told by your doctor. Use an inhaler, vaporizer, or humidifier as told by your doctor. Take two teaspoons (10 mL) of honey at bedtime. This helps lessen your coughing at night. Drink enough fluid to keep your pee (urine) pale yellow. Do not smoke or use any products that contain nicotine or tobacco. If you need help quitting, ask your doctor. Get a lot of rest. Return to your normal activities when your doctor says that it is safe. Keep all follow-up visits. How is this prevented?  Wash your hands often with soap and water for at least 20 seconds. If you cannot use soap and water, use hand sanitizer. Avoid contact with people who have cold symptoms. Try not to touch your mouth, nose, or eyes with your hands. Avoid breathing in smoke or chemical fumes. Make sure to get the flu shot every year. Contact a doctor if: Your symptoms do not get better in 2 weeks. You have trouble coughing up the mucus. Your cough keeps you awake at night. You have a fever. Get help right away if: You cough up blood. You have chest pain.  You have very bad shortness of breath. You faint or keep feeling like you are going to faint. You have a very bad headache. Your fever or chills get worse. These symptoms may be an emergency. Get help right away. Call your local emergency services (911 in the [place] Do not wait to see if the symptoms will go away. Do not drive yourself to the hospital. Summary Acute bronchitis is when air tubes in the lungs (bronchi) suddenly get swollen. In adults, acute  bronchitis usually goes away within 2 weeks. Drink more fluids. This will help thin your mucus so it is easier to cough up. Take over-the-counter and prescription medicines only as told by your doctor. Contact a doctor if your symptoms do not improve after 2 weeks of treatment. This information is not intended to replace advice given to you by your health care provider. Make sure you discuss any questions you have with your health care provider. Document Revised: 09/25/2020 Document Reviewed: 09/25/2020 Elsevier Patient Education  2023 [employer].

## 2022-10-08 ENCOUNTER — Telehealth: Payer: Self-pay | Admitting: Nurse Practitioner

## 2022-10-08 ENCOUNTER — Ambulatory Visit
Admission: RE | Admit: 2022-10-08 | Discharge: 2022-10-08 | Disposition: A | Discharge status: Home / Self Care | Payer: Medicare HMO | Source: Ambulatory Visit | Attending: Nurse Practitioner | Admitting: Nurse Practitioner

## 2022-10-08 DIAGNOSIS — J40 Bronchitis, not specified as acute or chronic: Secondary | ICD-10-CM

## 2022-10-08 DIAGNOSIS — J439 Emphysema, unspecified: Secondary | ICD-10-CM | POA: Diagnosis not present

## 2022-10-08 DIAGNOSIS — R059 Cough, unspecified: Secondary | ICD-10-CM | POA: Diagnosis not present

## 2022-10-08 NOTE — Telephone Encounter (Signed)
Please call and determine which medications she has a question regarding

## 2022-10-08 NOTE — Telephone Encounter (Signed)
Wife was looking at a different date for x-ray.  Will let them know when we get the results from the x-ray done today.

## 2022-10-08 NOTE — Telephone Encounter (Signed)
Sent [employer] to [employer]. If provider note is insufficient  for Oximetry test and nocturnal oxygen, Tonya Cranford may need to refer back to pulmonary doctor as patient follows pulmonary for this issue.

## 2022-10-08 NOTE — Telephone Encounter (Signed)
Valetta Fuller, Renita Papa, Archie Patten, NP; Raynelle Dick, NP Nebulizer and Overnight Oximetry order was sent to [facility], via [facility]. I have advised patient he should receive both by delivery.

## 2022-10-08 NOTE — Telephone Encounter (Signed)
PER PATIENT'S WIFE, SHE HAS MEDICATION QUESTIONS. AT BOTTOM OF CHEST X-RAY IT TOLD HIM TO MAKE MEDICATIONS ADJUSTMENTS TO A CERTAIN MED. BUT SHE DOESN'T SEE IT ON HIS LIST. WOULD LIKE A CALL FROM NURSE

## 2022-10-12 ENCOUNTER — Telehealth: Payer: Self-pay | Admitting: Pharmacist

## 2022-10-12 NOTE — Progress Notes (Signed)
HC focus outreach prep started. Reviewing office visits, specialists visits, hospital visits, adherence, medications, and labs. Patient confirmed outreach for 10/13/2022. ( )

## 2022-10-13 ENCOUNTER — Telehealth: Payer: Self-pay | Admitting: *Deleted

## 2022-10-13 NOTE — Telephone Encounter (Signed)
Patient reports that his cough has improved since taking medications prescribed (Augmentin, Prednisone taper, Promethazine DM and Duonebs). He has an occasional productive cough. Phlegm slightly yellow tinged and white. He is taking his Duonebs 4x/day. He completed his CXR last week and awaiting results. Denies any more issues with flank pain. He says his new nebulizer machine is scheduled to be delivered on 5/14 along with his overnight oximetry. He says his appetite has increased since taking the Prednisone. He knows when/reasons to call the office. 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 Very positive attitude and appreciative of follow up.  CRN Chart Prep: 5 min CRN Documentation: 3 min CRN Phone Call: 14 min

## 2022-10-15 ENCOUNTER — Ambulatory Visit: Payer: Medicare HMO | Admitting: Nurse Practitioner

## 2022-10-16 ENCOUNTER — Other Ambulatory Visit: Payer: Self-pay

## 2022-10-20 DIAGNOSIS — J449 Chronic obstructive pulmonary disease, unspecified: Secondary | ICD-10-CM | POA: Diagnosis not present

## 2022-10-20 DIAGNOSIS — R0902 Hypoxemia: Secondary | ICD-10-CM | POA: Diagnosis not present

## 2022-10-21 ENCOUNTER — Other Ambulatory Visit: Payer: Self-pay

## 2022-10-21 ENCOUNTER — Emergency Department (HOSPITAL_COMMUNITY): Payer: Medicare HMO

## 2022-10-21 ENCOUNTER — Encounter (HOSPITAL_COMMUNITY): Payer: Self-pay

## 2022-10-21 ENCOUNTER — Telehealth: Payer: Self-pay

## 2022-10-21 ENCOUNTER — Observation Stay (HOSPITAL_COMMUNITY)
Admission: EM | Admit: 2022-10-21 | Discharge: 2022-10-23 | Disposition: A | Discharge status: Home / Self Care | Payer: Medicare HMO | Attending: Internal Medicine | Admitting: Internal Medicine

## 2022-10-21 DIAGNOSIS — Z87891 Personal history of nicotine dependence: Secondary | ICD-10-CM | POA: Insufficient documentation

## 2022-10-21 DIAGNOSIS — Z89511 Acquired absence of right leg below knee: Secondary | ICD-10-CM | POA: Insufficient documentation

## 2022-10-21 DIAGNOSIS — Z7902 Long term (current) use of antithrombotics/antiplatelets: Secondary | ICD-10-CM | POA: Diagnosis not present

## 2022-10-21 DIAGNOSIS — D72829 Elevated white blood cell count, unspecified: Secondary | ICD-10-CM | POA: Insufficient documentation

## 2022-10-21 DIAGNOSIS — Z1152 Encounter for screening for COVID-19: Secondary | ICD-10-CM | POA: Diagnosis not present

## 2022-10-21 DIAGNOSIS — J449 Chronic obstructive pulmonary disease, unspecified: Secondary | ICD-10-CM | POA: Diagnosis not present

## 2022-10-21 DIAGNOSIS — I2510 Atherosclerotic heart disease of native coronary artery without angina pectoris: Secondary | ICD-10-CM | POA: Insufficient documentation

## 2022-10-21 DIAGNOSIS — J441 Chronic obstructive pulmonary disease with (acute) exacerbation: Principal | ICD-10-CM | POA: Insufficient documentation

## 2022-10-21 DIAGNOSIS — Z79899 Other long term (current) drug therapy: Secondary | ICD-10-CM | POA: Diagnosis not present

## 2022-10-21 DIAGNOSIS — I129 Hypertensive chronic kidney disease with stage 1 through stage 4 chronic kidney disease, or unspecified chronic kidney disease: Secondary | ICD-10-CM | POA: Insufficient documentation

## 2022-10-21 DIAGNOSIS — Z85828 Personal history of other malignant neoplasm of skin: Secondary | ICD-10-CM | POA: Insufficient documentation

## 2022-10-21 DIAGNOSIS — R059 Cough, unspecified: Secondary | ICD-10-CM | POA: Diagnosis not present

## 2022-10-21 DIAGNOSIS — Z7952 Long term (current) use of systemic steroids: Secondary | ICD-10-CM | POA: Diagnosis not present

## 2022-10-21 DIAGNOSIS — K219 Gastro-esophageal reflux disease without esophagitis: Secondary | ICD-10-CM | POA: Diagnosis present

## 2022-10-21 DIAGNOSIS — Z8673 Personal history of transient ischemic attack (TIA), and cerebral infarction without residual deficits: Secondary | ICD-10-CM | POA: Insufficient documentation

## 2022-10-21 DIAGNOSIS — J439 Emphysema, unspecified: Secondary | ICD-10-CM | POA: Diagnosis not present

## 2022-10-21 DIAGNOSIS — R0602 Shortness of breath: Secondary | ICD-10-CM | POA: Diagnosis not present

## 2022-10-21 DIAGNOSIS — N182 Chronic kidney disease, stage 2 (mild): Secondary | ICD-10-CM | POA: Diagnosis present

## 2022-10-21 DIAGNOSIS — N183 Chronic kidney disease, stage 3 unspecified: Secondary | ICD-10-CM | POA: Diagnosis not present

## 2022-10-21 LAB — CBC WITH DIFFERENTIAL/PLATELET
Abs Immature Granulocytes: 0.59 10*3/uL — ABNORMAL HIGH (ref 0.00–0.07)
Basophils Absolute: 0.2 10*3/uL — ABNORMAL HIGH (ref 0.0–0.1)
Basophils Relative: 1 %
Eosinophils Absolute: 0.3 10*3/uL (ref 0.0–0.5)
Eosinophils Relative: 2 %
HCT: 41.8 % (ref 39.0–52.0)
Hemoglobin: 13.7 g/dL (ref 13.0–17.0)
Immature Granulocytes: 4 %
Lymphocytes Relative: 7 %
Lymphs Abs: 1.1 10*3/uL (ref 0.7–4.0)
MCH: 32.7 pg (ref 26.0–34.0)
MCHC: 32.8 g/dL (ref 30.0–36.0)
MCV: 99.8 fL (ref 80.0–100.0)
Monocytes Absolute: 1.3 10*3/uL — ABNORMAL HIGH (ref 0.1–1.0)
Monocytes Relative: 8 %
Neutro Abs: 12 10*3/uL — ABNORMAL HIGH (ref 1.7–7.7)
Neutrophils Relative %: 78 %
Platelets: 308 10*3/uL (ref 150–400)
RBC: 4.19 MIL/uL — ABNORMAL LOW (ref 4.22–5.81)
RDW: 14.2 % (ref 11.5–15.5)
WBC: 15.5 10*3/uL — ABNORMAL HIGH (ref 4.0–10.5)
nRBC: 0 % (ref 0.0–0.2)

## 2022-10-21 LAB — COMPREHENSIVE METABOLIC PANEL
ALT: 20 U/L (ref 0–44)
AST: 28 U/L (ref 15–41)
Albumin: 3.2 g/dL — ABNORMAL LOW (ref 3.5–5.0)
Alkaline Phosphatase: 68 U/L (ref 38–126)
Anion gap: 8 (ref 5–15)
BUN: 20 mg/dL (ref 8–23)
CO2: 26 mmol/L (ref 22–32)
Calcium: 9.5 mg/dL (ref 8.9–10.3)
Chloride: 102 mmol/L (ref 98–111)
Creatinine, Ser: 1.21 mg/dL (ref 0.61–1.24)
GFR, Estimated: 60 mL/min — ABNORMAL LOW (ref >60)
Glucose, Bld: 99 mg/dL (ref 70–99)
Potassium: 4.3 mmol/L (ref 3.5–5.1)
Sodium: 136 mmol/L (ref 135–145)
Total Bilirubin: 1 mg/dL (ref 0.3–1.2)
Total Protein: 5.8 g/dL — ABNORMAL LOW (ref 6.5–8.1)

## 2022-10-21 LAB — TROPONIN I (HIGH SENSITIVITY)
Troponin I (High Sensitivity): 9 ng/L (ref <18)
Troponin I (High Sensitivity): 9 ng/L (ref <18)

## 2022-10-21 LAB — SARS CORONAVIRUS 2 BY RT PCR: SARS Coronavirus 2 by RT PCR: NEGATIVE

## 2022-10-21 MED ORDER — ENOXAPARIN SODIUM 40 MG/0.4ML IJ SOSY
40.0000 mg | PREFILLED_SYRINGE | INTRAMUSCULAR | Status: DC
  Administered 2022-10-21 – 2022-10-22 (×2): 40 mg via SUBCUTANEOUS
  Filled 2022-10-21 (×2): qty 0.4

## 2022-10-21 MED ORDER — IPRATROPIUM-ALBUTEROL 0.5-2.5 (3) MG/3ML IN SOLN
3.0000 mL | Freq: Four times a day (QID) | RESPIRATORY_TRACT | Status: DC
  Administered 2022-10-21: 3 mL via RESPIRATORY_TRACT
  Filled 2022-10-21 (×2): qty 3

## 2022-10-21 MED ORDER — ALBUTEROL SULFATE HFA 108 (90 BASE) MCG/ACT IN AERS: 2.0000 | INHALATION_SPRAY | RESPIRATORY_TRACT | Status: DC | PRN

## 2022-10-21 MED ORDER — METHYLPREDNISOLONE SODIUM SUCC 40 MG IJ SOLR
40.0000 mg | Freq: Two times a day (BID) | INTRAMUSCULAR | Status: AC
  Administered 2022-10-21 – 2022-10-22 (×2): 40 mg via INTRAVENOUS
  Filled 2022-10-21 (×2): qty 1

## 2022-10-21 MED ORDER — CLOPIDOGREL BISULFATE 75 MG PO TABS
75.0000 mg | ORAL_TABLET | Freq: Every day | ORAL | Status: DC
  Administered 2022-10-21 – 2022-10-23 (×3): 75 mg via ORAL
  Filled 2022-10-21 (×3): qty 1

## 2022-10-21 MED ORDER — BUDESONIDE 0.5 MG/2ML IN SUSP
2.0000 mg | Freq: Two times a day (BID) | RESPIRATORY_TRACT | Status: DC
  Administered 2022-10-21: 2 mg via RESPIRATORY_TRACT
  Administered 2022-10-22: 0.5 mg via RESPIRATORY_TRACT
  Administered 2022-10-22: 2 mg via RESPIRATORY_TRACT
  Filled 2022-10-21 (×3): qty 8

## 2022-10-21 MED ORDER — FLUTICASONE FUROATE-VILANTEROL 200-25 MCG/ACT IN AEPB
1.0000 | INHALATION_SPRAY | Freq: Every day | RESPIRATORY_TRACT | Status: DC
  Administered 2022-10-22 – 2022-10-23 (×2): 1 via RESPIRATORY_TRACT
  Filled 2022-10-21 (×2): qty 28

## 2022-10-21 MED ORDER — ACETAMINOPHEN 325 MG PO TABS: 650.0000 mg | ORAL_TABLET | Freq: Four times a day (QID) | ORAL | Status: DC | PRN

## 2022-10-21 MED ORDER — EZETIMIBE 10 MG PO TABS
10.0000 mg | ORAL_TABLET | Freq: Every day | ORAL | Status: DC
  Administered 2022-10-21 – 2022-10-23 (×3): 10 mg via ORAL
  Filled 2022-10-21 (×3): qty 1

## 2022-10-21 MED ORDER — BUDESON-GLYCOPYRROL-FORMOTEROL 160-9-4.8 MCG/ACT IN AERO: 2.0000 | INHALATION_SPRAY | Freq: Two times a day (BID) | RESPIRATORY_TRACT | Status: DC

## 2022-10-21 MED ORDER — ATORVASTATIN CALCIUM 80 MG PO TABS
80.0000 mg | ORAL_TABLET | Freq: Every day | ORAL | Status: DC
  Administered 2022-10-21 – 2022-10-23 (×3): 80 mg via ORAL
  Filled 2022-10-21 (×3): qty 1

## 2022-10-21 MED ORDER — UMECLIDINIUM BROMIDE 62.5 MCG/ACT IN AEPB
1.0000 | INHALATION_SPRAY | Freq: Every day | RESPIRATORY_TRACT | Status: DC
  Administered 2022-10-22 – 2022-10-23 (×2): 1 via RESPIRATORY_TRACT
  Filled 2022-10-21 (×2): qty 7

## 2022-10-21 MED ORDER — PREDNISONE 20 MG PO TABS: 40.0000 mg | ORAL_TABLET | Freq: Every day | ORAL | Status: DC

## 2022-10-21 MED ORDER — DOXYCYCLINE HYCLATE 100 MG PO TABS
100.0000 mg | ORAL_TABLET | Freq: Two times a day (BID) | ORAL | Status: DC
  Administered 2022-10-21 – 2022-10-23 (×4): 100 mg via ORAL
  Filled 2022-10-21 (×4): qty 1

## 2022-10-21 MED ORDER — IPRATROPIUM-ALBUTEROL 0.5-2.5 (3) MG/3ML IN SOLN: 3.0000 mL | RESPIRATORY_TRACT | Status: DC | PRN

## 2022-10-21 MED ORDER — LORATADINE 10 MG PO TABS
10.0000 mg | ORAL_TABLET | Freq: Every day | ORAL | Status: DC
  Administered 2022-10-21 – 2022-10-23 (×3): 10 mg via ORAL
  Filled 2022-10-21 (×3): qty 1

## 2022-10-21 MED ORDER — FLUTICASONE FUROATE-VILANTEROL 200-25 MCG/ACT IN AEPB
1.0000 | INHALATION_SPRAY | Freq: Every day | RESPIRATORY_TRACT | Status: DC
  Filled 2022-10-21: qty 28

## 2022-10-21 MED ORDER — IPRATROPIUM-ALBUTEROL 0.5-2.5 (3) MG/3ML IN SOLN
3.0000 mL | Freq: Once | RESPIRATORY_TRACT | Status: AC
  Administered 2022-10-21: 3 mL via RESPIRATORY_TRACT
  Filled 2022-10-21: qty 3

## 2022-10-21 MED ORDER — BUSPIRONE HCL 5 MG PO TABS
5.0000 mg | ORAL_TABLET | Freq: Two times a day (BID) | ORAL | Status: DC
  Administered 2022-10-21 – 2022-10-23 (×4): 5 mg via ORAL
  Filled 2022-10-21 (×4): qty 1

## 2022-10-21 MED ORDER — IOHEXOL 350 MG/ML SOLN
75.0000 mL | Freq: Once | INTRAVENOUS | Status: AC | PRN
  Administered 2022-10-21: 75 mL via INTRAVENOUS

## 2022-10-21 MED ORDER — UMECLIDINIUM BROMIDE 62.5 MCG/ACT IN AEPB
1.0000 | INHALATION_SPRAY | Freq: Every day | RESPIRATORY_TRACT | Status: DC
  Filled 2022-10-21: qty 7

## 2022-10-21 MED ORDER — METHYLPREDNISOLONE SODIUM SUCC 125 MG IJ SOLR
125.0000 mg | Freq: Once | INTRAMUSCULAR | Status: AC
  Administered 2022-10-21: 125 mg via INTRAVENOUS
  Filled 2022-10-21: qty 2

## 2022-10-21 MED ORDER — FLUTICASONE PROPIONATE 50 MCG/ACT NA SUSP
1.0000 | Freq: Every day | NASAL | Status: DC
  Administered 2022-10-21 – 2022-10-23 (×3): 1 via NASAL
  Filled 2022-10-21 (×2): qty 16

## 2022-10-21 MED ORDER — ALBUTEROL SULFATE (2.5 MG/3ML) 0.083% IN NEBU: 2.5000 mg | INHALATION_SOLUTION | RESPIRATORY_TRACT | Status: DC | PRN

## 2022-10-21 MED ORDER — BENZONATATE 100 MG PO CAPS: 100.0000 mg | ORAL_CAPSULE | Freq: Three times a day (TID) | ORAL | Status: DC | PRN

## 2022-10-21 NOTE — ED Provider Notes (Signed)
Emergency Department Provider Note   I have reviewed the triage vital signs and the nursing notes.   HISTORY  Chief Complaint Shortness of Breath and Cough   HPI Leslie Cardenas is a 82 y.o. male COPD presents to the emergency department with persistent cough, chest discomfort, shortness of breath with exertion.  He is followed by pulmonology and earlier this month was started on Augmentin and steroid.  He has finished those medications but continues to have symptoms.  He documented O2 sats of 88% at home and was advised by his pulmonologist to be evaluated in the emergency department.  He did have some chest discomfort this morning which she thought was related to reflux and improved with Tums.  He also took a breathing treatment prior to arrival. No active CP. Unsure regarding fever. He is having some productive cough.    Past Medical History:  Diagnosis Date   Aortic atherosclerosis (HCC) 03/04/2018   Noted on CT Abd/pelvis   Cataract    Bilateral   Colon polyp    COPD (chronic obstructive pulmonary disease) (HCC)    Coronary artery disease    Diverticulosis 07/16/2016   SIGMOID, NOTED ON COLONOSCOPY   Esophageal stenosis 02/24/2018   noted on endoscopy   Esophageal stricture    GERD (gastroesophageal reflux disease)    Grade I diastolic dysfunction 08/09/2017   noted on ECHO   History of hiatal hernia 02/24/2018   Small noted on endoscopy   Hydronephrosis    Hydronephrosis of right kidney 03/04/2018   URETERAL STONE 7 MM, NOTED ONJ CT ABD/PELVIS   Hyperlipidemia    Myocardial infarction [facility])    [id number]   Nephrolithiasis    s/p stent placement   Skin cancer    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 NOTED ON CT ABD/PELVIS   Vitamin D deficiency    Wears dentures    upper    Review of Systems  Constitutional: No fever/chills Cardiovascular: Positive chest pain. Respiratory: Positive shortness of  breath. Gastrointestinal: No abdominal pain. Musculoskeletal: Negative for back pain. Skin: Negative for rash. Neurological: Negative for headaches.   ____________________________________________   PHYSICAL EXAM:  VITAL SIGNS: ED Triage Vitals  Enc Vitals Group     BP 10/21/22 1007 (!) 148/91     Pulse Rate 10/21/22 1007 79     Resp 10/21/22 1007 20     Temp 10/21/22 1007 97.7 F (36.5 C)     Temp Source 10/21/22 1007 Oral     SpO2 10/21/22 1007 97 %     Weight 10/21/22 1012 135 lb (61.2 kg)     Height 10/21/22 1012 5\' 8"  (1.727 m)   Constitutional: Alert and oriented. Well appearing and in no acute distress. Eyes: Conjunctivae are normal.  Head: Atraumatic. Nose: No congestion/rhinnorhea. Mouth/Throat: Mucous membranes are moist. Neck: No stridor.   Cardiovascular: Normal rate, regular rhythm. Good peripheral circulation. Grossly normal heart sounds.   Respiratory: Increase respiratory effort with exertion.  No retractions. Lungs with trace end-expiratory wheezing. No rales or rhonchi.  Gastrointestinal: Soft and nontender. No distention.  Musculoskeletal: No lower extremity tenderness nor edema. No gross deformities of extremities. Neurologic:  Normal speech and language.  Skin:  Skin is warm, dry and intact. No rash noted.   ____________________________________________   LABS (all labs ordered are listed, but only abnormal results are displayed)  Labs Reviewed  SARS CORONAVIRUS 2 BY RT PCR  CBC WITH DIFFERENTIAL/PLATELET  COMPREHENSIVE METABOLIC PANEL  TROPONIN I (HIGH SENSITIVITY)   ____________________________________________  EKG   EKG Interpretation  Date/Time:  Wednesday Oct 21 2022 10:16:18 EDT Ventricular Rate:  57 PR Interval:  122 QRS Duration: 109 QT Interval:  441 QTC Calculation: 430 R Axis:   89 Text Interpretation: Sinus arrhythmia Consider right ventricular hypertrophy Borderline T abnormalities, anterior leads Confirmed by Alona Bene  [phone]) on 10/21/2022 10:19:47 AM        ____________________________________________  RADIOLOGY  No results found.  ____________________________________________   PROCEDURES  Procedure(s) performed:   Procedures   ____________________________________________   INITIAL IMPRESSION / ASSESSMENT AND PLAN / ED COURSE  Pertinent labs & imaging results that were available during my care of the patient were reviewed by me and considered in my medical decision making (see chart for details).   This patient is Presenting for Evaluation of CP/SOB, which does require a range of treatment options, and is a complaint that involves a high risk of morbidity and mortality.  The Differential Diagnoses includes but is not exclusive to acute coronary syndrome, aortic dissection, pulmonary embolism, cardiac tamponade, community-acquired pneumonia, pericarditis, musculoskeletal chest wall pain, etc.   Critical Interventions-    Medications  ipratropium-albuterol (DUONEB) 0.5-2.5 (3) MG/3ML nebulizer solution 3 mL (has no administration in time range)    Reassessment after intervention: symptoms slightly improved.    I did obtain Additional Historical Information from family at bedside.   I decided to review pertinent External Data, and in summary patient followed by Pulmonology with telephone note from today advising ED evaluation for hypoxemia at home.   Clinical Laboratory Tests Ordered, included ***  Radiologic Tests Ordered, included CXR. I independently interpreted the images and agree with radiology interpretation.   Cardiac Monitor Tracing which shows NSR.    Social Determinants of Health Risk patient is not an active smoker.   Consult complete with  Medical Decision Making: Summary:  Patient presents emergency department for evaluation of chest pain with shortness of breath.  Fairly minimal wheezing on my initial exam but patient did use a breathing treatment prior to  arrival.  Also describing some atypical chest pain.  Plan for CT imaging of the chest to evaluate for occult infection and rule out PE.  He is not anticoagulated.  Seems most consistent with COPD exacerbation with hypoxemia at home.  Has completed recent course of steroid and antibiotic with no real improvement, however.   Reevaluation with update and discussion with   ***Considered admission***  Patient's presentation is most consistent with acute presentation with potential threat to life or bodily function.   Disposition:   ____________________________________________  FINAL CLINICAL IMPRESSION(S) / ED DIAGNOSES  Final diagnoses:  None     NEW OUTPATIENT MEDICATIONS STARTED DURING THIS VISIT:  New Prescriptions   No medications on file    Note:  This document was prepared using Dragon voice recognition software and may include unintentional dictation errors.  Alona Bene, MD, [facility] Emergency Medicine

## 2022-10-21 NOTE — Telephone Encounter (Signed)
Per Dr. Oneta Rack the patient was advises to go to his local emergency department for COPD exacerbation with O2 saturations of 88%. His ex-wife also reported a blood pressure of 151/91 with pulse rate of 49. The patient was contacted and advised to report to his local emergency department and gave verbal understanding to this staff member.

## 2022-10-21 NOTE — Plan of Care (Signed)
  Problem: Education: Goal: Knowledge of disease or condition will improve Outcome: Progressing   

## 2022-10-21 NOTE — ED Notes (Signed)
ED TO INPATIENT HANDOFF REPORT  ED Nurse Name and Phone #: Brinda Focht [phone]  S Name/Age/Gender Leslie Cardenas 82 y.o. male Room/Bed: 003C/003C  Code Status   Code Status: DNR  Home/SNF/Other Home Patient oriented to: self, place, time, and situation Is this baseline? Yes   Triage Complete: Triage complete  Chief Complaint COPD (chronic obstructive pulmonary disease) (HCC) [J44.9]  Triage Note PT reports sob and productive cough for about 3 weeks now. Pt completed a course of abx with no improvement. Pt AxOx4.    Allergies No Known Allergies  Level of Care/Admitting Diagnosis ED Disposition     ED Disposition  Admit   Condition  --   Comment  Hospital Area: [facility] [[id number]]  Level of Care: Med-Surg [16]  May place patient in observation at [facility] or [facility] if equivalent level of care is available:: No  Covid Evaluation: Asymptomatic - no recent exposure (last 10 days) testing not required  Diagnosis: COPD (chronic obstructive pulmonary disease) [facility]) [[id number]]  Admitting Physician: Emeline General [[id number]]  Attending Physician: Emeline General [[id number]]          B Medical/Surgery History Past Medical History:  Diagnosis Date   Aortic atherosclerosis (HCC) 03/04/2018   Noted on CT Abd/pelvis   Cataract    Bilateral   Colon polyp    COPD (chronic obstructive pulmonary disease) (HCC)    Coronary artery disease    Diverticulosis 07/16/2016   SIGMOID, NOTED ON COLONOSCOPY   Esophageal stenosis 02/24/2018   noted on endoscopy   Esophageal stricture    GERD (gastroesophageal reflux disease)    Grade I diastolic dysfunction 08/09/2017   noted on ECHO   History of hiatal hernia 02/24/2018   Small noted on endoscopy   Hydronephrosis    Hydronephrosis of right kidney 03/04/2018   URETERAL STONE 7 MM, NOTED ONJ CT ABD/PELVIS   Hyperlipidemia    Myocardial infarction [facility])    [id number]   Nephrolithiasis    s/p stent placement    Skin cancer    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 NOTED ON CT ABD/PELVIS   Vitamin D deficiency    Wears dentures    upper   Past Surgical History:  Procedure Laterality Date   CARDIAC CATHETERIZATION  1983   No intervention performed.    CATARACT EXTRACTION, BILATERAL     COLONOSCOPY     COLONOSCOPY W/ POLYPECTOMY  08/12/2010   Dr. Stan Head   COLONOSCOPY W/ POLYPECTOMY  07/16/2016   CYSTOSCOPY W/ URETERAL STENT PLACEMENT Right 03/04/2018   Procedure: CYSTOSCOPY WITH RETROGRADE PYELOGRAM/URETERAL STENT PLACEMENT;  Surgeon: Crista Elliot, MD;  Location: [facility] OR;  Service: Urology;  Laterality: Right;   CYSTOSCOPY/URETEROSCOPY/HOLMIUM LASER/STENT PLACEMENT Right 03/16/2018   Procedure: CYSTOSCOPY RIGHT URETEROSCOPY/HOLMIUM LASER/STENT PLACEMENT;  Surgeon: Crista Elliot, MD;  Location: [facility];  Service: Urology;  Laterality: Right;   ESOPHAGOGASTRODUODENOSCOPY     INGUINAL HERNIA REPAIR     left   right leg amputation  05/14/1977   SHOULDER ARTHROSCOPY Left 2000   UPPER GASTROINTESTINAL ENDOSCOPY  WITH DILATION  02/24/2018     A IV Location/Drains/Wounds Patient Lines/Drains/Airways Status     Active Line/Drains/Airways     Name Placement date Placement time Site Days   Peripheral IV 10/21/22 20 G Right Antecubital 10/21/22  1238  Antecubital  less than 1  Intake/Output Last 24 hours No intake or output data in the 24 hours ending 10/21/22 1628  Labs/Imaging Results for orders placed or performed during the hospital encounter of 10/21/22 (from the past 48 hour(s))  CBC with Differential     Status: Abnormal   Collection Time: 10/21/22 10:15 AM  Result Value Ref Range   WBC 15.5 (H) 4.0 - 10.5 K/uL   RBC 4.19 (L) 4.22 - 5.81 MIL/uL   Hemoglobin 13.7 13.0 - 17.0 g/dL   HCT 16.1 09.6 - 04.5 %   MCV 99.8 80.0 - 100.0 fL   MCH 32.7 26.0 - 34.0 pg   MCHC 32.8 30.0 - 36.0  g/dL   RDW 40.9 81.1 - 91.4 %   Platelets 308 150 - 400 K/uL   nRBC 0.0 0.0 - 0.2 %   Neutrophils Relative % 78 %   Neutro Abs 12.0 (H) 1.7 - 7.7 K/uL   Lymphocytes Relative 7 %   Lymphs Abs 1.1 0.7 - 4.0 K/uL   Monocytes Relative 8 %   Monocytes Absolute 1.3 (H) 0.1 - 1.0 K/uL   Eosinophils Relative 2 %   Eosinophils Absolute 0.3 0.0 - 0.5 K/uL   Basophils Relative 1 %   Basophils Absolute 0.2 (H) 0.0 - 0.1 K/uL   Immature Granulocytes 4 %   Abs Immature Granulocytes 0.59 (H) 0.00 - 0.07 K/uL    Comment: Performed at [facility], [address], [place], [place] [address]  Comprehensive metabolic panel     Status: Abnormal   Collection Time: 10/21/22 10:15 AM  Result Value Ref Range   Sodium 136 135 - 145 mmol/L   Potassium 4.3 3.5 - 5.1 mmol/L    Comment: HEMOLYSIS AT THIS LEVEL MAY AFFECT RESULT   Chloride 102 98 - 111 mmol/L   CO2 26 22 - 32 mmol/L   Glucose, Bld 99 70 - 99 mg/dL    Comment: Glucose reference range applies only to samples taken after fasting for at least 8 hours.   BUN 20 8 - 23 mg/dL   Creatinine, Ser 6.21 0.61 - 1.24 mg/dL   Calcium 9.5 8.9 - 30.8 mg/dL   Total Protein 5.8 (L) 6.5 - 8.1 g/dL   Albumin 3.2 (L) 3.5 - 5.0 g/dL   AST 28 15 - 41 U/L    Comment: HEMOLYSIS AT THIS LEVEL MAY AFFECT RESULT   ALT 20 0 - 44 U/L    Comment: HEMOLYSIS AT THIS LEVEL MAY AFFECT RESULT   Alkaline Phosphatase 68 38 - 126 U/L   Total Bilirubin 1.0 0.3 - 1.2 mg/dL    Comment: HEMOLYSIS AT THIS LEVEL MAY AFFECT RESULT   GFR, Estimated 60 (L) >60 mL/min    Comment: (NOTE) Calculated using the CKD-EPI Creatinine Equation (2021)    Anion gap 8 5 - 15    Comment: Performed at [facility], [address], [place], [place] [address]  Troponin I (High Sensitivity)     Status: None   Collection Time: 10/21/22 10:15 AM  Result Value Ref Range   Troponin I (High Sensitivity) 9 <18 ng/L    Comment: (NOTE) Elevated high sensitivity troponin I (hsTnI) values and  significant  changes across serial measurements may suggest ACS but many other  chronic and acute conditions are known to elevate hsTnI results.  Refer to the "Links" section for chest pain algorithms and additional  guidance. Performed at [facility], [address], [place], [place] [address]  SARS Coronavirus 2 by RT PCR (hospital order, performed in [facility] hospital lab) *cepheid single result test* Anterior Nasal Swab     Status: None   Collection Time: 10/21/22 10:25 AM   Specimen: Anterior Nasal Swab  Result Value Ref Range   SARS Coronavirus 2 by RT PCR NEGATIVE NEGATIVE    Comment: Performed at [facility], [address], [place], [place] [address]  Troponin I (High Sensitivity)     Status: None   Collection Time: 10/21/22  1:40 PM  Result Value Ref Range   Troponin I (High Sensitivity) 9 <18 ng/L    Comment: (NOTE) Elevated high sensitivity troponin I (hsTnI) values and significant  changes across serial measurements may suggest ACS but many other  chronic and acute conditions are known to elevate hsTnI results.  Refer to the "Links" section for chest pain algorithms and additional  guidance. Performed at [facility], [address] [place], [place] [address]    CT Angio Chest PE W and/or Wo Contrast  Result Date: 10/21/2022 CLINICAL DATA:  Pulmonary embolism (PE) suspected, high prob. Shortness of breath and productive cough for 3 weeks. EXAM: CT ANGIOGRAPHY CHEST WITH CONTRAST TECHNIQUE: Multidetector CT imaging of the chest was performed using the standard protocol during bolus administration of intravenous contrast. Multiplanar CT image reconstructions and MIPs were obtained to evaluate the vascular anatomy. RADIATION DOSE REDUCTION: This exam was performed according to the departmental dose-optimization program which includes automated exposure control, adjustment of the mA and/or kV according to patient size and/or use of iterative  reconstruction technique. CONTRAST:  75mL OMNIPAQUE IOHEXOL 350 MG/ML SOLN COMPARISON:  Chest CT 01/08/2015 FINDINGS: Cardiovascular: Pulmonary arterial opacification is adequate, and no definite emboli are identified within limitations of motion artifact which predominantly affects distal segmental and subsegmental evaluation in the lower lobes. There is thoracic aortic atherosclerosis without aneurysm. The heart is normal in size. Mediastinum/Nodes: No enlarged axillary, mediastinal, or hilar lymph nodes. Small sliding hiatal hernia. Unremarkable thyroid. Lungs/Pleura: No pleural effusion or pneumothorax. Moderate centrilobular emphysema. Increased bronchial wall thickening. Respiratory motion and mild opacities in the lower lobes favored to reflect atelectasis. Upper Abdomen: No acute abnormality. Musculoskeletal: Chronic moderate T12 compression fracture, new from 2016 but unchanged from 01/13/2022 chest radiographs. Review of the MIP images confirms the above findings. IMPRESSION: 1. No evidence of pulmonary emboli. 2. COPD with increased bronchial wall thickening. 3. Aortic Atherosclerosis (ICD10-I70.0) and Emphysema (ICD10-J43.9). Electronically Signed   By: Sebastian Ache M.D.   On: 10/21/2022 14:31   DG Chest 2 View  Result Date: 10/21/2022 CLINICAL DATA:  Shortness of breath EXAM: CHEST - 2 VIEW COMPARISON:  10/08/2022 FINDINGS: Normal cardiac contours. Aortic tortuosity and atherosclerosis. Redemonstrated hyperinflation and architectural distortion from emphysema. No focal pulmonary opacity. No pleural effusion or pneumothorax. No acute osseous abnormality. Chronic L1 compression deformity. IMPRESSION: No active cardiopulmonary disease. Electronically Signed   By: Wiliam Ke M.D.   On: 10/21/2022 11:14    Pending Labs Unresulted Labs (From admission, onward)     Start     Ordered   10/21/22 1548  Mycoplasma pneumoniae antibody, IgM  Once,   R        10/21/22 1547   10/21/22 1548  Legionella  Pneumophila Serogp 1 Ur Ag  Once,   R        10/21/22 1547   10/21/22 1540  Expectorated Sputum Assessment w Gram Stain, Rflx to Resp Cult  (COPD / Pneumonia / Cellulitis /  Lower Extremity Wound)  Once,   R        10/21/22 1539            Vitals/Pain Today's Vitals   10/21/22 1007 10/21/22 1012 10/21/22 1400 10/21/22 1445  BP: (!) 148/91  (!) 151/70 (!) 153/77  Pulse: 79  67 73  Resp: 20  (!) 24 16  Temp: 97.7 F (36.5 C)     TempSrc: Oral     SpO2: 97%  97% 98%  Weight:  135 lb (61.2 kg)    Height:  5\' 8"  (1.727 m)      Isolation Precautions No active isolations  Medications Medications  ipratropium-albuterol (DUONEB) 0.5-2.5 (3) MG/3ML nebulizer solution 3 mL (has no administration in time range)  acetaminophen (TYLENOL) tablet 650 mg (has no administration in time range)  atorvastatin (LIPITOR) tablet 80 mg (has no administration in time range)  ezetimibe (ZETIA) tablet 10 mg (has no administration in time range)  busPIRone (BUSPAR) tablet 5 mg (has no administration in time range)  clopidogrel (PLAVIX) tablet 75 mg (has no administration in time range)  Budeson-Glycopyrrol-Formoterol 160-9-4.8 MCG/ACT AERO 2 puff (has no administration in time range)  fluticasone (FLONASE) 50 MCG/ACT nasal spray 1 spray (has no administration in time range)  loratadine (CLARITIN) tablet 10 mg (has no administration in time range)  benzonatate (TESSALON) capsule 100 mg (has no administration in time range)  doxycycline (VIBRA-TABS) tablet 100 mg (has no administration in time range)  methylPREDNISolone sodium succinate (SOLU-MEDROL) 40 mg/mL injection 40 mg (has no administration in time range)    Followed by  predniSONE (DELTASONE) tablet 40 mg (has no administration in time range)  budesonide (PULMICORT) nebulizer solution 2 mg (has no administration in time range)  ipratropium-albuterol (DUONEB) 0.5-2.5 (3) MG/3ML nebulizer solution 3 mL (has no administration in time range)   enoxaparin (LOVENOX) injection 40 mg (has no administration in time range)  albuterol (PROVENTIL) (2.5 MG/3ML) 0.083% nebulizer solution 2.5 mg (has no administration in time range)  ipratropium-albuterol (DUONEB) 0.5-2.5 (3) MG/3ML nebulizer solution 3 mL (3 mLs Nebulization Given 10/21/22 1114)  iohexol (OMNIPAQUE) 350 MG/ML injection 75 mL (75 mLs Intravenous Contrast Given 10/21/22 1415)  methylPREDNISolone sodium succinate (SOLU-MEDROL) 125 mg/2 mL injection 125 mg (125 mg Intravenous Given 10/21/22 1532)    Mobility walks     Focused Assessments Pulmonary Assessment Handoff:  Lung sounds: Bilateral Breath Sounds: Clear O2 Device: Room Air      R Recommendations: See Admitting Provider Note  Report given to:   Additional Notes:

## 2022-10-21 NOTE — H&P (Signed)
History and Physical    Leslie Cardenas ZOX:[id number] DOB: March 26, 1941 DOA: 10/21/2022  PCP: Lucky Cowboy, MD (Confirm with patient/family/NH records and if not entered, this has to be entered at [facility] point of entry) Patient coming from: Home  I have personally briefly reviewed patient's old medical records in [facility]  Chief Complaint: Cough, SOB  HPI: Leslie Cardenas is a 82 y.o. male with medical history significant of COPD Gold stage III, stroke on Plavix, HTN, CAD, presented with persistent cough wheezing shortness of breath.  Patient first developed wheezing and productive cough with thick yellowish sputum 2 to 3 weeks ago went to see PCP, when he was diagnosed with " bronchitis and lung infection", for which patient was prescribed with 7 days course of Augmentin and 11 days course of tapering dose of prednisone which patient completed last Friday.  He reported partially relief of symptoms however over the weekend his symptoms getting worse with persistent yellowish sputum production, wheezing, cough, and worsening of exertional dyspnea.  Last 2 days even walking inside the house causing significant shortness of breath and the wife checked pulse ox at home was in the mid 80s and decided to bring him to the hospital.  Denies any chest pain, no fever or chills.  ED Course: Patient was found to be afebrile, none tachycardia, none hypoxic on room air.  However even short distance ambulation within his own room causing pulse ox to drop to mid 80s within 1 minute.  Checks x-ray showed no significant acute infiltrates. WBC 15, creatinine 1.2, CT angiogram negative for PE  Given 1 dose of IV Solu-Medrol 125 mg as well as multiple rounds of DuoNebs with no significant improvement  Review of Systems: As per HPI otherwise 14 point review of systems negative.    Past Medical History:  Diagnosis Date   Aortic atherosclerosis (HCC) 03/04/2018   Noted on CT Abd/pelvis   Cataract     Bilateral   Colon polyp    COPD (chronic obstructive pulmonary disease) (HCC)    Coronary artery disease    Diverticulosis 07/16/2016   SIGMOID, NOTED ON COLONOSCOPY   Esophageal stenosis 02/24/2018   noted on endoscopy   Esophageal stricture    GERD (gastroesophageal reflux disease)    Grade I diastolic dysfunction 08/09/2017   noted on ECHO   History of hiatal hernia 02/24/2018   Small noted on endoscopy   Hydronephrosis    Hydronephrosis of right kidney 03/04/2018   URETERAL STONE 7 MM, NOTED ONJ CT ABD/PELVIS   Hyperlipidemia    Myocardial infarction [facility])    [id number]   Nephrolithiasis    s/p stent placement   Skin cancer    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 NOTED ON CT ABD/PELVIS   Vitamin D deficiency    Wears dentures    upper    Past Surgical History:  Procedure Laterality Date   CARDIAC CATHETERIZATION  1983   No intervention performed.    CATARACT EXTRACTION, BILATERAL     COLONOSCOPY     COLONOSCOPY W/ POLYPECTOMY  08/12/2010   Dr. Stan Head   COLONOSCOPY W/ POLYPECTOMY  07/16/2016   CYSTOSCOPY W/ URETERAL STENT PLACEMENT Right 03/04/2018   Procedure: CYSTOSCOPY WITH RETROGRADE PYELOGRAM/URETERAL STENT PLACEMENT;  Surgeon: Crista Elliot, MD;  Location[facility] OR;  Service: Urology;  Laterality: Right;   CYSTOSCOPY/URETEROSCOPY/HOLMIUM LASER/STENT PLACEMENT Right 03/16/2018   Procedure: CYSTOSCOPY RIGHT URETEROSCOPY/HOLMIUM  LASER/STENT PLACEMENT;  Surgeon: Crista Elliot, MD;  Location: [facility];  Service: Urology;  Laterality: Right;   ESOPHAGOGASTRODUODENOSCOPY     INGUINAL HERNIA REPAIR     left   right leg amputation  05/14/1977   SHOULDER ARTHROSCOPY Left 2000   UPPER GASTROINTESTINAL ENDOSCOPY  WITH DILATION  02/24/2018     reports that he quit smoking about 22 years ago. His smoking use included cigarettes. He has a 60.00 pack-year smoking history. He has never used  smokeless tobacco. He reports that he does not currently use alcohol. He reports that he does not use drugs.  No Known Allergies  Family History  Problem Relation Age of Onset   Hypertension Father    Pancreatic cancer Mother    Cancer Mother    Colon cancer Brother    Leukemia Brother    Prostate cancer Brother    Other Brother        polio   Esophageal cancer Neg Hx    Rectal cancer Neg Hx    Stomach cancer Neg Hx      Prior to Admission medications   Medication Sig Start Date End Date Taking? Authorizing Provider  acetaminophen (TYLENOL) 500 MG tablet Take 1,500 mg by mouth every 6 (six) hours as needed for mild pain or moderate pain. Patient only uses when needed.   Yes [provider]  albuterol (VENTOLIN HFA) 108 (90 Base) MCG/ACT inhaler Inhale 2 puffs into the lungs every 4 (four) hours as needed for wheezing or shortness of breath. Make sure you rinse mouth out after each use. 08/29/19  Yes McClanahan, Kyra, NP  atorvastatin (LIPITOR) 80 MG tablet TAKE 1 TABLET BY MOUTH EVERY DAY 09/24/22  Yes Raynelle Dick, NP  clopidogrel (PLAVIX) 75 MG tablet Take 1 tablet (75 mg total) by mouth daily. 10/06/22 10/06/23 Yes Camara, Amalia Hailey, MD  ezetimibe (ZETIA) 10 MG tablet Take 1 tablet (10 mg total) by mouth daily. 06/17/22  Yes Cranford, Archie Patten, NP  fluticasone (FLONASE) 50 MCG/ACT nasal spray Place 1 spray into both nostrils daily.   Yes [provider]  ipratropium-albuterol (DUONEB) 0.5-2.5 (3) MG/3ML SOLN Take 3 mLs by nebulization every 4 (four) hours as needed. Max:6 doses per day Patient taking differently: Take 3 mLs by nebulization every 6 (six) hours as needed. Max:4 doses per day 10/07/22  Yes Raynelle Dick, NP  loratadine (CLARITIN) 10 MG tablet TAKE ONE PILL ONCE DAILY AS NEEDED FOR ITCHING Patient taking differently: Take 10 mg by mouth daily. 09/18/22  Yes Omar Person, MD  Multiple Vitamin (MULTIVITAMIN) tablet Take 1 tablet by mouth daily.   Yes  [provider]  predniSONE (DELTASONE) 5 MG tablet Take 5 mg by mouth 2 (two) times daily with a meal.   Yes [provider]  promethazine-dextromethorphan (PROMETHAZINE-DM) 6.25-15 MG/5ML syrup Take 5 mLs by mouth 4 (four) times daily as needed for cough. 10/07/22  Yes Raynelle Dick, NP  VITAMIN D PO Take 5,000 Units by mouth daily. Taking 10,000 units daily   Yes [provider]  Budeson-Glycopyrrol-Formoterol (BREZTRI AEROSPHERE) 160-9-4.8 MCG/ACT AERO Inhale 2 puffs into the lungs in the morning and at bedtime. 07/20/22   Omar Person, MD  busPIRone (BUSPAR) 5 MG tablet TAKE 1/2 TO 1 TABLET 3 X /DAY FOR ANXIETY 09/13/22   Adela Glimpse, NP  nitroGLYCERIN (NITROSTAT) 0.4 MG SL tablet Place 0.4 mg under the tongue every 5 (five) minutes as needed for chest pain.  [provider]  Respiratory Therapy Supplies (NEBULIZER/TUBING/MOUTHPIECE) KIT Disp one nebulizer machine, tubing set and mouthpiece kit for use 4 times daily 07/18/20   Lucky Cowboy, MD  Spacer/Aero-Holding Deretha Emory DEVI 2 puffs by Does not apply route 2 (two) times daily. Please use with MDI inhalers 07/04/21   Omar Person, MD    Physical Exam: Vitals:   10/21/22 1007 10/21/22 1012 10/21/22 1400 10/21/22 1445  BP: (!) 148/91  (!) 151/70 (!) 153/77  Pulse: 79  67 73  Resp: 20  (!) 24 16  Temp: 97.7 F (36.5 C)     TempSrc: Oral     SpO2: 97%  97% 98%  Weight:  61.2 kg    Height:  5\' 8"  (1.727 m)      Constitutional: NAD, calm, comfortable Vitals:   10/21/22 1007 10/21/22 1012 10/21/22 1400 10/21/22 1445  BP: (!) 148/91  (!) 151/70 (!) 153/77  Pulse: 79  67 73  Resp: 20  (!) 24 16  Temp: 97.7 F (36.5 C)     TempSrc: Oral     SpO2: 97%  97% 98%  Weight:  61.2 kg    Height:  5\' 8"  (1.727 m)     Eyes: PERRL, lids and conjunctivae normal ENMT: Mucous membranes are moist. Posterior pharynx clear of any exudate or lesions.Normal dentition.  Neck: normal, supple, no  masses, no thyromegaly Respiratory: Diminished breathing sound bilaterally, scattered wheezing, no crackles.  Increasing respiratory effort. No accessory muscle use.  Cardiovascular: Regular rate and rhythm, no murmurs / rubs / gallops. No extremity edema. 2+ pedal pulses. No carotid bruits.  Abdomen: no tenderness, no masses palpated. No hepatosplenomegaly. Bowel sounds positive.  Musculoskeletal: no clubbing / cyanosis. No joint deformity upper and lower extremities. Good ROM, no contractures. Normal muscle tone.  Skin: no rashes, lesions, ulcers. No induration Neurologic: CN 2-12 grossly intact. Sensation intact, DTR normal. Strength 5/5 in all 4.  Psychiatric: Normal judgment and insight. Alert and oriented x 3. Normal mood.     Labs on Admission: I have personally reviewed following labs and imaging studies  CBC: Recent Labs  Lab 10/21/22 1015  WBC 15.5*  NEUTROABS 12.0*  HGB 13.7  HCT 41.8  MCV 99.8  PLT 308   Basic Metabolic Panel: Recent Labs  Lab 10/21/22 1015  NA 136  K 4.3  CL 102  CO2 26  GLUCOSE 99  BUN 20  CREATININE 1.21  CALCIUM 9.5   GFR: Estimated Creatinine Clearance: 40.7 mL/min (by C-G formula based on SCr of 1.21 mg/dL). Liver Function Tests: Recent Labs  Lab 10/21/22 1015  AST 28  ALT 20  ALKPHOS 68  BILITOT 1.0  PROT 5.8*  ALBUMIN 3.2*   No results for input(s): "LIPASE", "AMYLASE" in the last 168 hours. No results for input(s): "AMMONIA" in the last 168 hours. Coagulation Profile: No results for input(s): "INR", "PROTIME" in the last 168 hours. Cardiac Enzymes: No results for input(s): "CKTOTAL", "CKMB", "CKMBINDEX", "TROPONINI" in the last 168 hours. BNP (last 3 results) No results for input(s): "PROBNP" in the last 8760 hours. HbA1C: No results for input(s): "HGBA1C" in the last 72 hours. CBG: No results for input(s): "GLUCAP" in the last 168 hours. Lipid Profile: No results for input(s): "CHOL", "HDL", "LDLCALC", "TRIG",  "CHOLHDL", "LDLDIRECT" in the last 72 hours. Thyroid Function Tests: No results for input(s): "TSH", "T4TOTAL", "FREET4", "T3FREE", "THYROIDAB" in the last 72 hours. Anemia Panel: No results for input(s): "VITAMINB12", "FOLATE", "FERRITIN", "TIBC", "IRON", "RETICCTPCT" in  the last 72 hours. Urine analysis:    Component Value Date/Time   COLORURINE YELLOW 07/29/2022 0956   APPEARANCEUR CLEAR 07/29/2022 0956   LABSPEC 1.016 07/29/2022 0956   PHURINE 5.5 07/29/2022 0956   GLUCOSEU NEGATIVE 07/29/2022 0956   HGBUR NEGATIVE 07/29/2022 0956   BILIRUBINUR NEGATIVE 06/11/2022 0121   KETONESUR NEGATIVE 07/29/2022 0956   PROTEINUR NEGATIVE 07/29/2022 0956   NITRITE NEGATIVE 07/29/2022 0956   LEUKOCYTESUR NEGATIVE 07/29/2022 0956    Radiological Exams on Admission: CT Angio Chest PE W and/or Wo Contrast  Result Date: 10/21/2022 CLINICAL DATA:  Pulmonary embolism (PE) suspected, high prob. Shortness of breath and productive cough for 3 weeks. EXAM: CT ANGIOGRAPHY CHEST WITH CONTRAST TECHNIQUE: Multidetector CT imaging of the chest was performed using the standard protocol during bolus administration of intravenous contrast. Multiplanar CT image reconstructions and MIPs were obtained to evaluate the vascular anatomy. RADIATION DOSE REDUCTION: This exam was performed according to the departmental dose-optimization program which includes automated exposure control, adjustment of the mA and/or kV according to patient size and/or use of iterative reconstruction technique. CONTRAST:  75mL OMNIPAQUE IOHEXOL 350 MG/ML SOLN COMPARISON:  Chest CT 01/08/2015 FINDINGS: Cardiovascular: Pulmonary arterial opacification is adequate, and no definite emboli are identified within limitations of motion artifact which predominantly affects distal segmental and subsegmental evaluation in the lower lobes. There is thoracic aortic atherosclerosis without aneurysm. The heart is normal in size. Mediastinum/Nodes: No enlarged  axillary, mediastinal, or hilar lymph nodes. Small sliding hiatal hernia. Unremarkable thyroid. Lungs/Pleura: No pleural effusion or pneumothorax. Moderate centrilobular emphysema. Increased bronchial wall thickening. Respiratory motion and mild opacities in the lower lobes favored to reflect atelectasis. Upper Abdomen: No acute abnormality. Musculoskeletal: Chronic moderate T12 compression fracture, new from 2016 but unchanged from 01/13/2022 chest radiographs. Review of the MIP images confirms the above findings. IMPRESSION: 1. No evidence of pulmonary emboli. 2. COPD with increased bronchial wall thickening. 3. Aortic Atherosclerosis (ICD10-I70.0) and Emphysema (ICD10-J43.9). Electronically Signed   By: Sebastian Ache M.D.   On: 10/21/2022 14:31   DG Chest 2 View  Result Date: 10/21/2022 CLINICAL DATA:  Shortness of breath EXAM: CHEST - 2 VIEW COMPARISON:  10/08/2022 FINDINGS: Normal cardiac contours. Aortic tortuosity and atherosclerosis. Redemonstrated hyperinflation and architectural distortion from emphysema. No focal pulmonary opacity. No pleural effusion or pneumothorax. No acute osseous abnormality. Chronic L1 compression deformity. IMPRESSION: No active cardiopulmonary disease. Electronically Signed   By: Wiliam Ke M.D.   On: 10/21/2022 11:14    EKG: Independently reviewed.  Sinus arrhythmia, no acute ST changes.  Assessment/Plan Principal Problem:   COPD (chronic obstructive pulmonary disease) (HCC) Active Problems:   GERD   CKD (chronic kidney disease) stage 3, GFR 30-59 ml/min (HCC)  (please populate well all problems here in Problem List. (For example, if patient is on BP meds at home and you resume or decide to hold them, it is a problem that needs to be her. Same for CAD, COPD, HLD and so on)  Acute COPD exacerbation, failed outpatient treatment -Escalate COPD treatment, IV Solu-Medrol x 2 then switch to p.o. prednisone for 5 days -Start doxycycline 100 mg twice daily, sputum  culture, atypical infection study -Incentive spirometry -Breathing treatment, DuoNebs, ICS and LABA -With a remote lung function test in 2014 showed FEV1 39%, indicating at least stage III COPD, will check ambulatory pulse ox for home oxygen evaluation in the morning. -CTA negative for  PE -Other Ddx, CHF unlikely given recent normal echocardiogram finding in January 2024  Leukocytosis -Probably secondary to recent steroid use.  Low suspicion for pneumonia. -ABX as above  Recent stroke -Off for 3 months dual antiplatelet therapy, on sole Plavix therapy -On statin  CAD -Stable  DVT prophylaxis: Lovenox Code Status: Full code Family Communication: Wife at bedside Disposition Plan: Expect less than 2 midnight hospital stay Consults called: None Admission status: MedSurg observation   Emeline General MD [facility] Pager [phone]  10/21/2022, 3:40 PM

## 2022-10-21 NOTE — ED Notes (Signed)
Patient returned from xray.

## 2022-10-21 NOTE — ED Notes (Signed)
Patient transported to x-ray. ?

## 2022-10-21 NOTE — ED Triage Notes (Signed)
PT reports sob and productive cough for about 3 weeks now. Pt completed a course of abx with no improvement. Pt AxOx4.

## 2022-10-22 DIAGNOSIS — J449 Chronic obstructive pulmonary disease, unspecified: Secondary | ICD-10-CM | POA: Diagnosis not present

## 2022-10-22 DIAGNOSIS — J441 Chronic obstructive pulmonary disease with (acute) exacerbation: Secondary | ICD-10-CM | POA: Diagnosis not present

## 2022-10-22 LAB — EXPECTORATED SPUTUM ASSESSMENT W GRAM STAIN, RFLX TO RESP C

## 2022-10-22 LAB — MYCOPLASMA PNEUMONIAE ANTIBODY, IGM: Mycoplasma pneumo IgM: <770 U/mL (ref 0–769)

## 2022-10-22 MED ORDER — IPRATROPIUM-ALBUTEROL 0.5-2.5 (3) MG/3ML IN SOLN
3.0000 mL | Freq: Three times a day (TID) | RESPIRATORY_TRACT | Status: DC
  Administered 2022-10-22 – 2022-10-23 (×4): 3 mL via RESPIRATORY_TRACT
  Filled 2022-10-22 (×4): qty 3

## 2022-10-22 MED ORDER — GUAIFENESIN ER 600 MG PO TB12
600.0000 mg | ORAL_TABLET | Freq: Two times a day (BID) | ORAL | Status: DC
  Administered 2022-10-22 – 2022-10-23 (×3): 600 mg via ORAL
  Filled 2022-10-22 (×3): qty 1

## 2022-10-22 MED ORDER — METHYLPREDNISOLONE SODIUM SUCC 40 MG IJ SOLR
40.0000 mg | Freq: Two times a day (BID) | INTRAMUSCULAR | Status: DC
  Administered 2022-10-22 – 2022-10-23 (×2): 40 mg via INTRAVENOUS
  Filled 2022-10-22 (×2): qty 1

## 2022-10-22 MED ORDER — HYDRALAZINE HCL 20 MG/ML IJ SOLN
10.0000 mg | Freq: Four times a day (QID) | INTRAMUSCULAR | Status: DC | PRN
  Filled 2022-10-22: qty 1

## 2022-10-22 NOTE — Progress Notes (Signed)
Overnight pulse ox study started at this time. Patient is on room air.

## 2022-10-22 NOTE — Evaluation (Signed)
Physical Therapy Evaluation Patient Details Name: Leslie Cardenas MRN: [id number] DOB: 1941-02-09 Today's Date: 10/22/2022  History of Present Illness  Pt is an 82 y/o M admitted on 10/21/22 after presenting with c/o persistent cough, wheezing & SOB. Pt is being treated for acute COPD exacerbation, failed OP treatment.  PMH: COPD stage 3, stroke on plavix, HTN, CAD, B cataracts, hiatal hernia, HLD, MI, RLE amputation, vitamin D deficiency  Clinical Impression  Pt seen for PT evaluation with pt agreeable to tx. Pt is a very pleasant man reporting prior to admission he was independent without AD with RLE prosthesis, driving, working & denies falls.  On this date pt is able to complete bed mobility & transfers independently, ambulate in hallway without AD with 1 mild LOB with pt able to correct without assistance. Pt able to maintain SpO2 >90% on room air throughout session. At this time, pt appears to be at baseline level of function & does not require acute PT services. PT to complete current orders, please re-consult if new needs arise.      Recommendations for follow up therapy are one component of a multi-disciplinary discharge planning process, led by the attending physician.  Recommendations may be updated based on patient status, additional functional criteria and insurance authorization.  Follow Up Recommendations       Assistance Recommended at Discharge PRN  Patient can return home with the following  Assistance with cooking/housework    Equipment Recommendations None recommended by PT  Recommendations for Other Services       Functional Status Assessment Patient has not had a recent decline in their functional status     Precautions / Restrictions Precautions Precautions: None Restrictions Weight Bearing Restrictions: No Other Position/Activity Restrictions: RLE prosthesis      Mobility  Bed Mobility Overal bed mobility: Independent                   Transfers Overall transfer level: Independent Equipment used: None               General transfer comment: STS from EOB    Ambulation/Gait Ambulation/Gait assistance: Modified independent (Device/Increase time) Gait Distance (Feet): 200 Feet Assistive device: None (RLE prosthesis)   Gait velocity: WNL     General Gait Details: slightly wider BOS  Stairs            Wheelchair Mobility    Modified Rankin (Stroke Patients Only)       Balance Overall balance assessment: Mild deficits observed, not formally tested Sitting-balance support: No upper extremity supported, Feet supported Sitting balance-Leahy Scale: Normal     Standing balance support: No upper extremity supported, During functional activity Standing balance-Leahy Scale: Good                               Pertinent Vitals/Pain      Home Living Family/patient expects to be discharged to:: Private residence Living Arrangements: Children Available Help at Discharge: Family;Available PRN/intermittently Type of Home: House Home Access: Stairs to enter Entrance Stairs-Rails: Right;Left;Can reach both Entrance Stairs-Number of Steps: 5   Home Layout: One level Home Equipment: Cane - single point Additional Comments: Pt lives with son who works during the day, has an ex-wife who is assisting him PRN    Prior Function Prior Level of Function : Independent/Modified Independent;Driving             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 ADLs Comments: Independent     Hand Dominance        Extremity/Trunk Assessment   Upper Extremity Assessment Upper Extremity Assessment: Overall WFL for tasks assessed    Lower Extremity Assessment Lower Extremity Assessment: Overall WFL for tasks assessed (RLE prosthesis already donned)    Cervical / Trunk Assessment Cervical / Trunk  Assessment: Normal  Communication   Communication: No difficulties  Cognition Arousal/Alertness: Awake/alert Behavior During Therapy: WFL for tasks assessed/performed Overall Cognitive Status: Within Functional Limits for tasks assessed                                 General Comments: very pleasant man        General Comments General comments (skin integrity, edema, etc.): Pt on room air with SPO2 >90% throughout session, no c/o SOB,  PT reiterates pursed lip breathing PRN, BP in LUE sitting EOB at beginning of session 155/74 mmHg 98, HR 96 bpm    Exercises     Assessment/Plan    PT Assessment Patient does not need any further PT services  PT Problem List         PT Treatment Interventions      PT Goals (Current goals can be found in the Care Plan section)  Acute Rehab PT Goals Patient Stated Goal: return home PT Goal Formulation: With patient Time For Goal Achievement: 11/05/22 Potential to Achieve Goals: Good    Frequency       Co-evaluation               AM-PAC PT "6 Clicks" Mobility  Outcome Measure Help needed turning from your back to your side while in a flat bed without using bedrails?: None Help needed moving from lying on your back to sitting on the side of a flat bed without using bedrails?: None Help needed moving to and from a bed to a chair (including a wheelchair)?: None Help needed standing up from a chair using your arms (e.g., wheelchair or bedside chair)?: None Help needed to walk in hospital room?: None Help needed climbing 3-5 steps with a railing? : None 6 Click Score: 24    End of Session   Activity Tolerance: Patient tolerated treatment well Patient left: in bed;with call bell/phone within reach        Time: 1017-1035 PT Time Calculation (min) (ACUTE ONLY): 18 min   Charges:   PT Evaluation $PT Eval Low Complexity: 1 Low          Aleda Grana, PT, DPT 10/22/22, 10:47 AM   Sandi Mariscal 10/22/2022, 10:45 AM

## 2022-10-22 NOTE — TOC Initial Note (Signed)
Transition of Care [facility]) - Initial/Assessment Note    Patient Details  Name: Leslie Cardenas MRN: [id number] Date of Birth: 06-Dec-1940  Transition of Care [facility]) CM/SW Contact:    Tom-Johnson, Hershal Coria, RN Phone Number: 10/22/2022, 3:30 PM  Clinical Narrative:                  CM spoke with patient at bedside about needs for post hospital transition.  Admitted for COPD, currently on room air, IV Solumedrol and Neb Tx. Does not use home O2.  From home with son, has four children. Independent with care prior to admission.  Has a cane, shower chair with back and grab bars at home. Patient states he is retired but works at the [employer] to keep himself busy. PCP is  Lucky Cowboy, MD and uses [facility] on [address]   No PT f/u noted. CM will continue to follow as patient progresses with care towards discharge.         Expected Discharge Plan: Home/Self Care Barriers to Discharge: Continued Medical Work up   Patient Goals and CMS Choice Patient states their goals for this hospitalization and ongoing recovery are:: To returnn home CMS Medicare.gov Compare Post Acute Care list provided to:: Patient Choice offered to / list presented to : NA      Expected Discharge Plan and Services   Discharge Planning Services: CM Consult   Living arrangements for the past 2 months: Single Family Home                 DME Arranged: N/A DME Agency: NA       HH Arranged: NA HH Agency: NA        Prior Living Arrangements/Services Living arrangements for the past 2 months: Single Family Home Lives with:: Adult Children (Son) Patient language and need for interpreter reviewed:: Yes Do you feel safe going back to the place where you live?: Yes      Need for Family Participation in Patient Care: Yes (Comment) Care giver support system in place?: Yes (comment) Current home services: DME (Cane, walker, shower chair with back, grab bars.) Criminal  Activity/Legal Involvement Pertinent to Current Situation/Hospitalization: No - Comment as needed  Activities of Daily Living Home Assistive Devices/Equipment: Prosthesis, Shower chair with back, Scales, Nebulizer, Hand-held shower hose, Grab bars in shower, Dentures (specify type) (upper dentures) ADL Screening (condition at time of admission) Patient's cognitive ability adequate to safely complete daily activities?: Yes Is the patient deaf or have difficulty hearing?: Yes Does the patient have difficulty seeing, even when wearing glasses/contacts?: No Does the patient have difficulty concentrating, remembering, or making decisions?: No Patient able to express need for assistance with ADLs?: No Does the patient have difficulty dressing or bathing?: No Independently performs ADLs?: Yes (appropriate for developmental age) Does the patient have difficulty walking or climbing stairs?: Yes Weakness of Legs: None Weakness of Arms/Hands: None  Permission Sought/Granted Permission sought to share information with : Case Manager, Family Supports Permission granted to share information with : Yes, Verbal Permission Granted              Emotional Assessment Appearance:: Appears stated age Attitude/Demeanor/Rapport: Engaged, Gracious Affect (typically observed): Accepting, Appropriate, Calm, Hopeful, Pleasant Orientation: : Oriented to Self, Oriented to Place, Oriented to  Time, Oriented to Situation Alcohol / Substance Use: Not Applicable Psych Involvement: No (comment)  Admission diagnosis:  COPD (chronic obstructive pulmonary disease) (HCC) [J44.9] COPD exacerbation (HCC) Kastor.Reeks.1]  Patient Active Problem List   Diagnosis Date Noted   Blurred vision 12/21/2020   TIA (transient ischemic attack) 12/21/2020   Thoracic aortic atherosclerosis (HCC) by CXR on 07/29/2018 06/05/2020   FH: hypertension 04/27/2018   CKD (chronic kidney disease) stage 3, GFR 30-59 ml/min (HCC) 04/27/2018    Abnormal glucose 04/27/2018   Nephrolithiasis 03/04/2018   Hydronephrosis 03/04/2018   Subacute osteomyelitis of right tibia (HCC) 09/22/2017   S/P unilateral BKA (below knee amputation), right (HCC) 09/22/2017   GERD 02/15/2015   Medicare annual wellness visit, initial 02/15/2015   ASHD hx/o MI 07/25/2014   Hx of right BKA (HCC) 01/15/2014   Essential hypertension 07/05/2013   Prediabetes 07/05/2013   Vitamin D deficiency 07/05/2013   Medication management 07/05/2013   History of colonic polyps 03/30/2012   Hyperlipidemia, mixed 11/13/2011   COPD (chronic obstructive pulmonary disease) (HCC) 11/13/2011   Sinus bradycardia 11/13/2011   PCP:  Lucky Cowboy, MD Pharmacy:   [id number] [place], [place] - [address] [place] [address] Phone: [phone] Fax: [phone]  [facility] [id number] - [address] [place] - [address] [place] [address] Phone: [phone] Fax: [phone]  [facility] - [place], [place] - [address] [place] [address] Phone: [phone] Fax: [phone]     Social Determinants of Health (SDOH) Social History: SDOH Screenings   Food Insecurity: No Food Insecurity (10/21/2022)  Housing: Low Risk  (10/21/2022)  Transportation Needs: No Transportation Needs (10/21/2022)  Utilities: Not At Risk (10/21/2022)  Alcohol Screen: Low Risk  (02/17/2018)  Depression (PHQ2-9): Low Risk  (04/28/2022)  Tobacco Use: Medium Risk (10/21/2022)   SDOH Interventions: Transportation Interventions: Intervention Not Indicated, Inpatient TOC, Patient Resources (Friends/Family)   Readmission Risk Interventions     No data to display

## 2022-10-22 NOTE — Plan of Care (Signed)

## 2022-10-22 NOTE — Care Management Obs Status (Signed)
MEDICARE OBSERVATION STATUS NOTIFICATION   Patient Details  Name: Leslie Cardenas MRN: [id number] Date of Birth: [birth year]   Medicare Observation Status Notification Given:  Yes    Tom-Johnson, Hershal Coria, RN 10/22/2022, 3:25 PM

## 2022-10-22 NOTE — Plan of Care (Signed)
  Problem: Education: Goal: Knowledge of disease or condition will improve Outcome: Completed/Met Goal: Knowledge of the prescribed therapeutic regimen will improve Outcome: Completed/Met Goal: Individualized Educational Video(s) Outcome: Not Applicable

## 2022-10-22 NOTE — Progress Notes (Signed)
PROGRESS NOTE  Leslie Cardenas  DOB: 01-04-1941  PCP: Lucky Cowboy, MD ZOX:[id number]  DOA: 10/21/2022  LOS: 0 days  Hospital Day: 2  Brief narrative: Leslie Cardenas is a 82 y.o. 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  Was seen by PCP and was treated with a 7-day course of Augmentin and tapering course of prednisone.  Patient initially felt relief but symptoms started to worsen again.  Over the course the last few days, patient has felt short of breath even on walking inside the house.  His wife noted that his oxygen saturation was low in the mid 80s and hence brought to the ED.  In the ED, patient was afebrile and dropped O2 sat to 80s on ambulation inside the room, requiring supplemental oxygen Labs with WBC count 15, creatinine 1.2 CT angio chest negative for PE.  It showed COPD with increased bronchial wall thickening. Patient was given IV Solu-Medrol, multiple rounds of DuoNebs without significant improvement Admitted to [facility]  Subjective: Patient was seen and examined this morning.  Pleasant elderly Caucasian male.  Propped up in bed.  Not short of breath at rest.  But coughs violently on deep breathing. Chart reviewed Overnight, afebrile, blood pressure elevated as high as 180s this morning  Assessment and plan: Acute COPD exacerbation Presented with 2 to 3 weeks of worsening shortness of breath not responding to outpatient course of antibiotics and prednisone.  CT chest showed increased bronchial wall thickening. Patient probably had some kind of viral/atypical bacterial infection causing flareup of bronchitis. On admission, was started on IV steroids and doxycycline 100 mg twice daily Sputum culture sent On exam today, patient  continues to have diminished air entry in both bases and coughs on deep breathing.  I will continue IV Solu-Medrol 40 mg twice daily for today. Continue bronchodilators, incentive spirometry Encourage ambulation check ambulatory oxygen requirement Patient also states that his oxygen level drops less than 80s at night.  Will obtain nocturnal pulse oximetry.   Leukocytosis Probably secondary to recent steroid use.   ABX as above Recent Labs  Lab 10/21/22 1015  WBC 15.5*   Recent stroke Was on DAPT for 3 months, currently on monotherapy with Plavix only.  Continue statin.   CAD History of MI HLD No other anginal symptoms other than shortness of breath. Troponin not elevated. Continue Plavix and statin Recent Labs    10/21/22 1015 10/21/22 1340  TROPONINIHS 9 9   Essential hypertension Does not seem to be on any blood pressure medicines at home.  Blood pressure running elevated this morning.  Started on IV hydralazine.  Anxiety/depression BuSpar 5 mg twice daily  Diverticulosis esophageal stricture  Nephrolithiasis  traumatic amputation of right leg   Mobility: Encourage ambulation  Goals of care   Code Status: DNR     DVT prophylaxis:  enoxaparin (LOVENOX) injection 40 mg Start: 10/21/22 1600   Antimicrobials: Doxycycline oral Fluid: None Consultants: None Family Communication: None at bedside  Status: Observation Level of care:  Med-Surg   Patient from: Home Anticipated d/c to: Hopefully home in 1 to 2 days Needs to continue in-hospital care:  Continues to be in respiratory distress, cough    Diet:  Diet Order  Diet Heart Room service appropriate? Yes; Fluid consistency: Thin  Diet effective now                   Scheduled Meds:  atorvastatin  80 mg Oral Daily   budesonide (PULMICORT) nebulizer solution  2 mg Nebulization Q12H   busPIRone  5 mg Oral BID   clopidogrel  75 mg Oral Daily   doxycycline  100 mg Oral Q12H    enoxaparin (LOVENOX) injection  40 mg Subcutaneous Q24H   ezetimibe  10 mg Oral Daily   fluticasone  1 spray Each Nare Daily   fluticasone furoate-vilanterol  1 puff Inhalation Daily   And   umeclidinium bromide  1 puff Inhalation Daily   ipratropium-albuterol  3 mL Nebulization TID   loratadine  10 mg Oral Daily   methylPREDNISolone (SOLU-MEDROL) injection  40 mg Intravenous Q12H   Followed by   Melene Muller ON 10/23/2022] predniSONE  40 mg Oral Q breakfast    PRN meds: acetaminophen, albuterol, benzonatate, hydrALAZINE, ipratropium-albuterol   Infusions:    Antimicrobials: Anti-infectives (From admission, onward)    Start     Dose/Rate Route Frequency Ordered Stop   10/21/22 1545  doxycycline (VIBRA-TABS) tablet 100 mg        100 mg Oral Every 12 hours 10/21/22 1539 10/26/22 0959       Nutritional status:  Body mass index is 19.33 kg/m.          Objective: Vitals:   10/22/22 0343 10/22/22 0837  BP: (!) 141/84 (!) 187/84  Pulse: 72 93  Resp: 18 20  Temp: 97.9 F (36.6 C) 98 F (36.7 C)  SpO2: 97% 95%    Intake/Output Summary (Last 24 hours) at 10/22/2022 0912 Last data filed at 10/21/2022 2129 Gross per 24 hour  Intake 0 ml  Output 400 ml  Net -400 ml   Filed Weights   10/21/22 1012 10/21/22 1727  Weight: 61.2 kg 61.1 kg   Weight change:  Body mass index is 19.33 kg/m.   Physical Exam: General exam: Pleasant, elderly Caucasian male. Skin: No rashes, lesions or ulcers. HEENT: Atraumatic, normocephalic, no obvious bleeding Lungs: Diminished air entry both bases, cough and deep breathing, CVS: Regular rate and rhythm, no murmur GI/Abd soft, nontender, nondistended, bowel sound present CNS: Alert, awake, oriented x 3 Psychiatry: Mood appropriate Extremities: No pedal edema, no calf tenderness  Data Review: I have personally reviewed the laboratory data and studies available.  F/u labs ordered Unresulted Labs (From admission, onward)     Start      Ordered   10/21/22 1548  Mycoplasma pneumoniae antibody, IgM  Once,   R        10/21/22 1547   10/21/22 1548  Legionella Pneumophila Serogp 1 Ur Ag  Once,   R        10/21/22 1547   10/21/22 1540  Expectorated Sputum Assessment w Gram Stain, Rflx to Resp Cult  (COPD / Pneumonia / Cellulitis / Lower Extremity Wound)  Once,   R        10/21/22 1539            Total time spent in review of labs and imaging, patient evaluation, formulation of plan, documentation and communication with family: 55 minutes  Signed, Lorin Glass, MD [facility] 10/22/2022

## 2022-10-23 DIAGNOSIS — J449 Chronic obstructive pulmonary disease, unspecified: Secondary | ICD-10-CM | POA: Diagnosis not present

## 2022-10-23 DIAGNOSIS — J441 Chronic obstructive pulmonary disease with (acute) exacerbation: Secondary | ICD-10-CM | POA: Diagnosis not present

## 2022-10-23 LAB — CULTURE, RESPIRATORY W GRAM STAIN

## 2022-10-23 LAB — LEGIONELLA PNEUMOPHILA SEROGP 1 UR AG: L. pneumophila Serogp 1 Ur Ag: NEGATIVE

## 2022-10-23 MED ORDER — DOXYCYCLINE HYCLATE 100 MG PO TABS: 100.0000 mg | ORAL_TABLET | Freq: Two times a day (BID) | ORAL | 0 refills | Status: AC

## 2022-10-23 MED ORDER — GUAIFENESIN ER 600 MG PO TB12: 600.0000 mg | ORAL_TABLET | Freq: Two times a day (BID) | ORAL | 0 refills | Status: AC

## 2022-10-23 MED ORDER — PREDNISONE 10 MG PO TABS: ORAL_TABLET | ORAL | 0 refills | Status: DC

## 2022-10-23 MED ORDER — PREDNISONE 20 MG PO TABS: 40.0000 mg | ORAL_TABLET | Freq: Every day | ORAL | Status: DC

## 2022-10-23 NOTE — Plan of Care (Signed)
  Problem: Respiratory: Goal: Ability to maintain a clear airway will improve Outcome: Adequate for Discharge Goal: Levels of oxygenation will improve Outcome: Adequate for Discharge Goal: Ability to maintain adequate ventilation will improve Outcome: Adequate for Discharge   Problem: Education: Goal: Knowledge of General Education information will improve Description: Including pain rating scale, medication(s)/side effects and non-pharmacologic comfort measures Outcome: Adequate for Discharge   Problem: Health Behavior/Discharge Planning: Goal: Ability to manage health-related needs will improve Outcome: Adequate for Discharge   Problem: Clinical Measurements: Goal: Ability to maintain clinical measurements within normal limits will improve Outcome: Adequate for Discharge Goal: Will remain free from infection Outcome: Adequate for Discharge Goal: Diagnostic test results will improve Outcome: Adequate for Discharge Goal: Respiratory complications will improve Outcome: Adequate for Discharge Goal: Cardiovascular complication will be avoided Outcome: Adequate for Discharge   Problem: Activity: Goal: Risk for activity intolerance will decrease Outcome: Adequate for Discharge   Problem: Nutrition: Goal: Adequate nutrition will be maintained Outcome: Adequate for Discharge   Problem: Coping: Goal: Level of anxiety will decrease Outcome: Adequate for Discharge   Problem: Elimination: Goal: Will not experience complications related to bowel motility Outcome: Adequate for Discharge Goal: Will not experience complications related to urinary retention Outcome: Adequate for Discharge   Problem: Pain Managment: Goal: General experience of comfort will improve Outcome: Adequate for Discharge   Problem: Safety: Goal: Ability to remain free from injury will improve Outcome: Adequate for Discharge   Problem: Skin Integrity: Goal: Risk for impaired skin integrity will  decrease Outcome: Adequate for Discharge

## 2022-10-23 NOTE — TOC Transition Note (Signed)
Transition of Care [facility]) - CM/SW Discharge Note   Patient Details  Name: Leslie Cardenas MRN: [id number] Date of Birth: January 26, 1941  Transition of Care [facility]) CM/SW Contact:  Tom-Johnson, Hershal Coria, RN Phone Number: 10/23/2022, 12:15 PM   Clinical Narrative:     Patient is scheduled for discharge today.  Outpatient f/u, hospital f/u and discharge instructions on AVS. No PT f/u noted, no TOC needs or recommendations noted. Ex-wife, Synetta Fail to transport at discharge.  No further TOC needs noted.       Final next level of care: Home/Self Care Barriers to Discharge: Barriers Resolved   Patient Goals and CMS Choice CMS Medicare.gov Compare Post Acute Care list provided to:: Patient Choice offered to / list presented to : NA  Discharge Placement                  Patient to be transferred to facility by: Ex-wife Name of family member notified: Synetta Fail    Discharge Plan and Services Additional resources added to the After Visit Summary for     Discharge Planning Services: CM Consult            DME Arranged: N/A DME Agency: NA       HH Arranged: NA HH Agency: NA        Social Determinants of Health (SDOH) Interventions SDOH Screenings   Food Insecurity: No Food Insecurity (10/21/2022)  Housing: Low Risk  (10/21/2022)  Transportation Needs: No Transportation Needs (10/21/2022)  Utilities: Not At Risk (10/21/2022)  Alcohol Screen: Low Risk  (02/17/2018)  Depression (PHQ2-9): Low Risk  (04/28/2022)  Tobacco Use: Medium Risk (10/21/2022)     Readmission Risk Interventions     No data to display

## 2022-10-23 NOTE — Progress Notes (Signed)
Dc'd via WC with Belenda Cruise, PCT to go home via private vehicle with wife to go home.

## 2022-10-23 NOTE — Discharge Summary (Signed)
Physician Discharge Summary  Leslie Cardenas ZOX:[id number] DOB: 1941-02-04 DOA: 10/21/2022  PCP: Lucky Cowboy, MD  Admit date: 10/21/2022 Discharge date: 10/23/2022  Admitted From: Home Discharge disposition: Home  Recommendations at discharge:  Continue 3 more days of oral doxycycline Complete tapering course of prednisone Mucinex for 2 weeks Continue bronchodilators as before Follow-up with your pulmonologist as an outpatient Follow-up with PCP for blood pressure monitoring and medicines as needed.   Brief narrative: Leslie Cardenas is a 82 y.o. male with PMH significant for HTN, HLD, CAD, stroke on Plavix, diverticulosis, esophageal stricture, nephrolithiasis. 5/15, patient presented to the ED with complaint of wheezing, shortness of breath, productive cough with yellow sputum for 2 to 3 weeks.  Was seen by PCP and was treated with a 7-day course of Augmentin and tapering course of prednisone.  Patient initially felt relief but symptoms started to worsen again.  Over the course the last few days, patient has felt short of breath even on walking inside the house.  His wife noted that his oxygen saturation was low in the mid 80s and hence brought to the ED.  In the ED, patient was afebrile and dropped O2 sat to 80s on ambulation inside the room, requiring supplemental oxygen Labs with WBC count 15, creatinine 1.2 CT angio chest negative for PE.  It showed COPD with increased bronchial wall thickening. Patient was given IV Solu-Medrol, multiple rounds of DuoNebs without significant improvement Admitted to [facility]  Subjective: Patient was seen and examined this morning.  Pleasant elderly Caucasian male.  Sitting up in recliner.  Not in distress.  Breathing better.  No wheezing.  Cough improving.  Nocturnal pulse oximetry done.  O2 sat did not drop less than 90%.  Patient feels ready to go home today.  Hospital course: Acute COPD exacerbation Presented with 2 to 3 weeks of worsening  shortness of breath not responding to outpatient course of antibiotics and prednisone.  CT chest showed increased bronchial wall thickening. Patient probably had some kind of viral/atypical bacterial infection causing flareup of bronchitis. On admission, was started on IV steroids and doxycycline 100 mg twice daily Sputum culture did not show any growth. On exam today, patient's respite status is better.  No wheezing.  No cough with deep breathing today.  Patient reports he is clearing up phlegm with Mucinex.  Feels ready to be discharged.   Continue doxycycline for 3 more days to complete 5-day course.  Discharge on tapering course of prednisone Continue Mucinex for 2 more weeks Continue bronchodilators as before He has been able to ambulate without supplemental oxygen.  Nocturnal pulse oximetry was unremarkable as well. Follows up with PCCM Dr. Izola Price as an outpatient   Leukocytosis Probably secondary to recent steroid use.   ABX as above Recent Labs  Lab 10/21/22 1015  WBC 15.5*   Recent stroke Was on DAPT for 3 months, currently on monotherapy with Plavix only.  Continue statin.   CAD History of MI HLD No other anginal symptoms other than shortness of breath. Troponin not elevated. Continue Plavix and statin Recent Labs    10/21/22 1015 10/21/22 1340  TROPONINIHS 9 9   Essential hypertension Does not seem to be on any blood pressure medicines at home.   Blood pressure running in 140s and 150s.  Patient believes it is because of the stress of breathing.  I have recommended him to follow-up with PCP as an outpatient postop monitoring and medicines as needed. Anxiety/depression BuSpar 5 mg twice  daily  Diverticulosis esophageal stricture  Nephrolithiasis   Mobility: Encourage ambulation  Goals of care   Code Status: DNR   Wounds:  -    Discharge Exam:   Vitals:   10/22/22 2220 10/23/22 0452 10/23/22 0757 10/23/22 0837  BP:  (!) 156/89  (!) 152/80  Pulse:   85  94  Resp:  20  19  Temp:  98.1 F (36.7 C)  98.1 F (36.7 C)  TempSrc:  Oral  Oral  SpO2: 99% 95% 94% 93%  Weight:      Height:        Body mass index is 19.33 kg/m.   General exam: Pleasant, elderly Caucasian male. Skin: No rashes, lesions or ulcers. HEENT: Atraumatic, normocephalic, no obvious bleeding Lungs: No crackles, wheezing.   CVS: Regular rate and rhythm, no murmur GI/Abd soft, nontender, nondistended, bowel sound present CNS: Alert, awake, oriented x 3 Psychiatry: Mood appropriate Extremities: No pedal edema, no calf tenderness  Follow ups:    Follow-up Information     Lucky Cowboy, MD Follow up.   Specialty: Internal Medicine Contact information: [address] [place] [address] [phone]                 Discharge Instructions:   Discharge Instructions     Call MD for:  difficulty breathing, headache or visual disturbances   Complete by: As directed    Call MD for:  extreme fatigue   Complete by: As directed    Call MD for:  hives   Complete by: As directed    Call MD for:  persistant dizziness or light-headedness   Complete by: As directed    Call MD for:  persistant nausea and vomiting   Complete by: As directed    Call MD for:  severe uncontrolled pain   Complete by: As directed    Call MD for:  temperature >100.4   Complete by: As directed    Diet general   Complete by: As directed    Discharge instructions   Complete by: As directed    Recommendations at discharge:   Continue 3 more days of oral doxycycline  Complete tapering course of prednisone  Mucinex for 2 weeks  Continue bronchodilators as before  Follow-up with your pulmonologist as an outpatient  Follow-up with PCP for blood pressure monitoring and medicines as needed.   General discharge instructions: Follow with Primary MD Lucky Cowboy, MD in 7 days  Please request your PCP  to go over your hospital tests, procedures,  radiology results at the follow up. Please get your medicines reviewed and adjusted.  Your PCP may decide to repeat certain labs or tests as needed. Do not drive, operate heavy machinery, perform activities at heights, swimming or participation in water activities or provide baby sitting services if your were admitted for syncope or siezures until you have seen by Primary MD or a Neurologist and advised to do so again. [place] Controlled Substance Reporting System database was reviewed. Do not drive, operate heavy machinery, perform activities at heights, swim, participate in water activities or provide baby-sitting services while on medications for pain, sleep and mood until your outpatient physician has reevaluated you and advised to do so again.  You are strongly recommended to comply with the dose, frequency and duration of prescribed medications. Activity: As tolerated with Full fall precautions use walker/cane & assistance as needed Avoid using any recreational substances like cigarette, tobacco, alcohol, or non-prescribed drug. If you  experience worsening of your admission symptoms, develop shortness of breath, life threatening emergency, suicidal or homicidal thoughts you must seek medical attention immediately by calling 911 or calling your MD immediately  if symptoms less severe. You must read complete instructions/literature along with all the possible adverse reactions/side effects for all the medicines you take and that have been prescribed to you. Take any new medicine only after you have completely understood and accepted all the possible adverse reactions/side effects.  Wear Seat belts while driving. You were cared for by a hospitalist during your hospital stay. If you have any questions about your discharge medications or the care you received while you were in the hospital after you are discharged, you can call the unit and ask to speak with the hospitalist or the covering physician.  Once you are discharged, your primary care physician will handle any further medical issues. Please note that NO REFILLS for any discharge medications will be authorized once you are discharged, as it is imperative that you return to your primary care physician (or establish a relationship with a primary care physician if you do not have one).   Increase activity slowly   Complete by: As directed        Discharge Medications:   Allergies as of 10/23/2022   No Known Allergies      Medication List     TAKE these medications    acetaminophen 500 MG tablet Commonly known as: TYLENOL Take 1,500 mg by mouth every 6 (six) hours as needed for mild pain or moderate pain. Patient only uses when needed.   albuterol 108 (90 Base) MCG/ACT inhaler Commonly known as: Ventolin HFA Inhale 2 puffs into the lungs every 4 (four) hours as needed for wheezing or shortness of breath. Make sure you rinse mouth out after each use.   atorvastatin 80 MG tablet Commonly known as: LIPITOR TAKE 1 TABLET BY MOUTH EVERY DAY   Breztri Aerosphere 160-9-4.8 MCG/ACT Aero Generic drug: Budeson-Glycopyrrol-Formoterol Inhale 2 puffs into the lungs in the morning and at bedtime.   busPIRone 5 MG tablet Commonly known as: BUSPAR TAKE 1/2 TO 1 TABLET 3 X /DAY FOR ANXIETY   clopidogrel 75 MG tablet Commonly known as: Plavix Take 1 tablet (75 mg total) by mouth daily.   doxycycline 100 MG tablet Commonly known as: VIBRA-TABS Take 1 tablet (100 mg total) by mouth every 12 (twelve) hours for 3 days.   ezetimibe 10 MG tablet Commonly known as: Zetia Take 1 tablet (10 mg total) by mouth daily.   fluticasone 50 MCG/ACT nasal spray Commonly known as: FLONASE Place 1 spray into both nostrils daily.   guaiFENesin 600 MG 12 hr tablet Commonly known as: MUCINEX Take 1 tablet (600 mg total) by mouth 2 (two) times daily for 14 days.   ipratropium-albuterol 0.5-2.5 (3) MG/3ML Soln Commonly known as: DUONEB Take 3  mLs by nebulization every 4 (four) hours as needed. Max:6 doses per day What changed:  when to take this additional instructions   loratadine 10 MG tablet Commonly known as: CLARITIN TAKE ONE PILL ONCE DAILY AS NEEDED FOR ITCHING What changed: See the new instructions.   multivitamin tablet Take 1 tablet by mouth daily.   Nebulizer/Tubing/Mouthpiece Kit Disp one nebulizer machine, tubing set and mouthpiece kit for use 4 times daily   nitroGLYCERIN 0.4 MG SL tablet Commonly known as: NITROSTAT Place 0.4 mg under the tongue every 5 (five) minutes as needed for chest pain.   predniSONE 10 MG  tablet Commonly known as: DELTASONE Take 4 tablets daily X 2 days, then, Take 3 tablets daily X 2 days, then, Take 2 tablets daily X 2 days, then, Take 1 tablets daily X 1 day. What changed:  medication strength how much to take how to take this when to take this additional instructions   promethazine-dextromethorphan 6.25-15 MG/5ML syrup Commonly known as: PROMETHAZINE-DM Take 5 mLs by mouth 4 (four) times daily as needed for cough.   Spacer/Aero-Holding Rudean Curt 2 puffs by Does not apply route 2 (two) times daily. Please use with MDI inhalers   VITAMIN D PO Take 5,000 Units by mouth daily. Taking 10,000 units daily         The results of significant diagnostics from this hospitalization (including imaging, microbiology, ancillary and laboratory) are listed below for reference.    Procedures and Diagnostic Studies:   CT Angio Chest PE W and/or Wo Contrast  Result Date: 10/21/2022 CLINICAL DATA:  Pulmonary embolism (PE) suspected, high prob. Shortness of breath and productive cough for 3 weeks. EXAM: CT ANGIOGRAPHY CHEST WITH CONTRAST TECHNIQUE: Multidetector CT imaging of the chest was performed using the standard protocol during bolus administration of intravenous contrast. Multiplanar CT image reconstructions and MIPs were obtained to evaluate the vascular anatomy.  RADIATION DOSE REDUCTION: This exam was performed according to the departmental dose-optimization program which includes automated exposure control, adjustment of the mA and/or kV according to patient size and/or use of iterative reconstruction technique. CONTRAST:  75mL OMNIPAQUE IOHEXOL 350 MG/ML SOLN COMPARISON:  Chest CT 01/08/2015 FINDINGS: Cardiovascular: Pulmonary arterial opacification is adequate, and no definite emboli are identified within limitations of motion artifact which predominantly affects distal segmental and subsegmental evaluation in the lower lobes. There is thoracic aortic atherosclerosis without aneurysm. The heart is normal in size. Mediastinum/Nodes: No enlarged axillary, mediastinal, or hilar lymph nodes. Small sliding hiatal hernia. Unremarkable thyroid. Lungs/Pleura: No pleural effusion or pneumothorax. Moderate centrilobular emphysema. Increased bronchial wall thickening. Respiratory motion and mild opacities in the lower lobes favored to reflect atelectasis. Upper Abdomen: No acute abnormality. Musculoskeletal: Chronic moderate T12 compression fracture, new from 2016 but unchanged from 01/13/2022 chest radiographs. Review of the MIP images confirms the above findings. IMPRESSION: 1. No evidence of pulmonary emboli. 2. COPD with increased bronchial wall thickening. 3. Aortic Atherosclerosis (ICD10-I70.0) and Emphysema (ICD10-J43.9). Electronically Signed   By: Sebastian Ache M.D.   On: 10/21/2022 14:31   DG Chest 2 View  Result Date: 10/21/2022 CLINICAL DATA:  Shortness of breath EXAM: CHEST - 2 VIEW COMPARISON:  10/08/2022 FINDINGS: Normal cardiac contours. Aortic tortuosity and atherosclerosis. Redemonstrated hyperinflation and architectural distortion from emphysema. No focal pulmonary opacity. No pleural effusion or pneumothorax. No acute osseous abnormality. Chronic L1 compression deformity. IMPRESSION: No active cardiopulmonary disease. Electronically Signed   By: Wiliam Ke  M.D.   On: 10/21/2022 11:14     Labs:   Basic Metabolic Panel: Recent Labs  Lab 10/21/22 1015  NA 136  K 4.3  CL 102  CO2 26  GLUCOSE 99  BUN 20  CREATININE 1.21  CALCIUM 9.5   GFR Estimated Creatinine Clearance: 40.7 mL/min (by C-G formula based on SCr of 1.21 mg/dL). Liver Function Tests: Recent Labs  Lab 10/21/22 1015  AST 28  ALT 20  ALKPHOS 68  BILITOT 1.0  PROT 5.8*  ALBUMIN 3.2*   No results for input(s): "LIPASE", "AMYLASE" in the last 168 hours. No results for input(s): "AMMONIA" in the last 168 hours. Coagulation profile No  results for input(s): "INR", "PROTIME" in the last 168 hours.  CBC: Recent Labs  Lab 10/21/22 1015  WBC 15.5*  NEUTROABS 12.0*  HGB 13.7  HCT 41.8  MCV 99.8  PLT 308   Cardiac Enzymes: No results for input(s): "CKTOTAL", "CKMB", "CKMBINDEX", "TROPONINI" in the last 168 hours. BNP: Invalid input(s): "POCBNP" CBG: No results for input(s): "GLUCAP" in the last 168 hours. D-Dimer No results for input(s): "DDIMER" in the last 72 hours. Hgb A1c No results for input(s): "HGBA1C" in the last 72 hours. Lipid Profile No results for input(s): "CHOL", "HDL", "LDLCALC", "TRIG", "CHOLHDL", "LDLDIRECT" in the last 72 hours. Thyroid function studies No results for input(s): "TSH", "T4TOTAL", "T3FREE", "THYROIDAB" in the last 72 hours.  Invalid input(s): "FREET3" Anemia work up No results for input(s): "VITAMINB12", "FOLATE", "FERRITIN", "TIBC", "IRON", "RETICCTPCT" in the last 72 hours. Microbiology Recent Results (from the past 240 hour(s))  SARS Coronavirus 2 by RT PCR (hospital order, performed in [facility] hospital lab) *cepheid single result test* Anterior Nasal Swab     Status: None   Collection Time: 10/21/22 10:25 AM   Specimen: Anterior Nasal Swab  Result Value Ref Range Status   SARS Coronavirus 2 by RT PCR NEGATIVE NEGATIVE Final    Comment: Performed at [facility], [address], [place], [place] [address]   Expectorated Sputum Assessment w Gram Stain, Rflx to Resp Cult     Status: None   Collection Time: 10/21/22  3:40 PM   Specimen: Expectorated Sputum  Result Value Ref Range Status   Specimen Description EXPECTORATED SPUTUM  Final   Special Requests NONE  Final   Sputum evaluation   Final    Sputum specimen not acceptable for testing.  Please recollect.   Gram Stain Report Called to,Read Back By and Verified With: RN TWilleen Cass [phone] @1511  FH Performed at [facility], [address], [place], [place] [address]    Report Status 10/22/2022 FINAL  Final  Expectorated Sputum Assessment w Gram Stain, Rflx to Resp Cult     Status: None   Collection Time: 10/22/22  6:27 PM   Specimen: Expectorated Sputum  Result Value Ref Range Status   Specimen Description EXPECTORATED SPUTUM  Final   Special Requests NONE  Final   Sputum evaluation   Final    THIS SPECIMEN IS ACCEPTABLE FOR SPUTUM CULTURE Performed at [facility], [address], [place], [place] [address]    Report Status 10/22/2022 FINAL  Final  Culture, Respiratory w Gram Stain     Status: None (Preliminary result)   Collection Time: 10/22/22  6:27 PM  Result Value Ref Range Status   Specimen Description EXPECTORATED SPUTUM  Final   Special Requests NONE Reflexed from [id number]  Final   Gram Stain   Final    FEW WBC PRESENT, PREDOMINANTLY PMN ABUNDANT GRAM NEGATIVE RODS FEW GRAM POSITIVE RODS FEW GRAM POSITIVE COCCI IN PAIRS Performed at [facility], [address], [place], [place] [address]    Culture PENDING  Incomplete   Report Status PENDING  Incomplete    Time coordinating discharge: 45 minutes  Signed: Inioluwa Baris  [facility] 10/23/2022, 11:24 AM

## 2022-10-24 LAB — CULTURE, RESPIRATORY W GRAM STAIN

## 2022-10-26 LAB — CULTURE, RESPIRATORY W GRAM STAIN

## 2022-10-28 ENCOUNTER — Other Ambulatory Visit: Payer: Self-pay | Admitting: Nurse Practitioner

## 2022-10-28 DIAGNOSIS — J40 Bronchitis, not specified as acute or chronic: Secondary | ICD-10-CM

## 2022-10-28 MED ORDER — CIPROFLOXACIN HCL 250 MG PO TABS: ORAL_TABLET | ORAL | 0 refills | Status: DC

## 2022-10-29 LAB — SUSCEPTIBILITY RESULT

## 2022-10-29 LAB — SUSCEPTIBILITY, AER + ANAEROB

## 2022-11-02 LAB — CULTURE, RESPIRATORY W GRAM STAIN

## 2022-11-04 ENCOUNTER — Ambulatory Visit (INDEPENDENT_AMBULATORY_CARE_PROVIDER_SITE_OTHER): Payer: Medicare HMO | Admitting: Nurse Practitioner

## 2022-11-04 ENCOUNTER — Encounter: Payer: Self-pay | Admitting: Nurse Practitioner

## 2022-11-04 VITALS — BP 120/72 | HR 88 | Temp 97.9°F | Ht 68.0 in | Wt 139.0 lb

## 2022-11-04 DIAGNOSIS — K1379 Other lesions of oral mucosa: Secondary | ICD-10-CM | POA: Diagnosis not present

## 2022-11-04 DIAGNOSIS — D72828 Other elevated white blood cell count: Secondary | ICD-10-CM

## 2022-11-04 DIAGNOSIS — Z09 Encounter for follow-up examination after completed treatment for conditions other than malignant neoplasm: Secondary | ICD-10-CM | POA: Diagnosis not present

## 2022-11-04 DIAGNOSIS — J441 Chronic obstructive pulmonary disease with (acute) exacerbation: Secondary | ICD-10-CM

## 2022-11-04 DIAGNOSIS — B370 Candidal stomatitis: Secondary | ICD-10-CM

## 2022-11-04 MED ORDER — NYSTATIN 100000 UNIT/ML MT SUSP
OROMUCOSAL | 0 refills | Status: DC
Start: 2022-11-04 — End: 2023-12-30

## 2022-11-04 NOTE — Progress Notes (Signed)
Hospital follow up  Assessment and Plan: Hospital visit follow up for:   1. Hospital discharge follow-up Reviewed discharge instructions in full including medication changes, diagnostics, labs, and future follow ups appointment. All questions and concerns addressed.   - CBC with Differential/Platelet - COMPLETE METABOLIC PANEL WITH GFR  2. COPD exacerbation (HCC) Resolved Continue inhalers as directed Continue to follow with Pulmonology  3. Other elevated white blood cell (WBC) count  - CBC with Differential/Platelet - COMPLETE METABOLIC PANEL WITH GFR  4. Mouth pain/thrush Secondary to inhaler use Reviewed and instructed inhaler use and importance of rinsing mouth after each use Start Nystatin Contact office if s/s fail to improve  - nystatin (MYCOSTATIN) 100000 UNIT/ML suspension; Swish 5 mL in the mouth and retain for as long as possible (several minutes) before swallowing.  Dispense: 60 mL; Refill: 0  All medications were reviewed with patient and family and fully reconciled. All questions answered fully, and patient and family members were encouraged to call the office with any further questions or concerns. Discussed goal to avoid readmission related to this diagnosis.  Over 35 minutes of exam, counseling, chart review, and complex, high/moderate level critical decision making was performed this visit.   Future Appointments  Date Time Provider Department Center  11/04/2022  2:30 PM Adela Glimpse, NP [facility] None  11/26/2022  9:30 AM Raynelle Dick, NP [facility] None  03/01/2023  9:30 AM Lucky Cowboy, MD [facility] None  05/31/2023 10:30 AM Raynelle Dick, NP [facility] None  08/12/2023  3:45 PM Windell Norfolk, MD [facility] None  08/30/2023 10:00 AM Lucky Cowboy, MD [facility] None     HPI 82 y.o.male presents with significant other for follow up for transition from recent hospitalization or SNIF stay. Admit date to the hospital was 10/21/22, patient was  discharged from the hospital on 10/23/22 and our clinical staff contacted the office the day after discharge to set up a follow up appointment. The discharge summary, medications, and diagnostic test results were reviewed before meeting with the patient. The patient was admitted for COPD Exacerbation.  Patient presented to the ED with complaints of wheezing, shortness of breath, productive cough with yellow sputum for 2 to 3 weeks. Was seen by PCP and was treated with a 7-day course of Augmentin and tapering course of prednisone. Patient initially felt relief but symptoms started to worsen again. Over the course the last few days, patient had felt short of breath even on walking inside the house. Noted that his oxygen saturation was low in the mid 80s and hence brought to the ED. In the ED, patient was afebrile and dropped O2 sat to 80s on ambulation inside the room, requiring supplemental oxygen Labs with WBC count 15, creatinine 1.2 CT angio chest negative for PE.  It showed COPD with increased bronchial wall thickening. Patient was given IV Solu-Medrol, multiple rounds of DuoNebs without significant improvement Admitted to [facility]  Noted to have some kind of viral/atypical bacterial infection causing flareup of bronchitis.  Suptum cx did not show any growth during the time of cx pending and abx was changed from doxycycline  in the outpatient setting to cover cx growth.  He has continued to take as directed and has x1 pill left.  He has completed tapering course of prednisone.  He is continuing to use Mucinex and inhalers as directed.  Has developed mouth pain, soreness and burning.  Trying to rinse his mouth out after each use.   Home health is not involved.  Images while in the hospital: CT Angio Chest PE W and/or Wo Contrast  Result Date: 10/21/2022 CLINICAL DATA:  Pulmonary embolism (PE) suspected, high prob. Shortness of breath and productive cough for 3 weeks. EXAM: CT ANGIOGRAPHY CHEST WITH  CONTRAST TECHNIQUE: Multidetector CT imaging of the chest was performed using the standard protocol during bolus administration of intravenous contrast. Multiplanar CT image reconstructions and MIPs were obtained to evaluate the vascular anatomy. RADIATION DOSE REDUCTION: This exam was performed according to the departmental dose-optimization program which includes automated exposure control, adjustment of the mA and/or kV according to patient size and/or use of iterative reconstruction technique. CONTRAST:  75mL OMNIPAQUE IOHEXOL 350 MG/ML SOLN COMPARISON:  Chest CT 01/08/2015 FINDINGS: Cardiovascular: Pulmonary arterial opacification is adequate, and no definite emboli are identified within limitations of motion artifact which predominantly affects distal segmental and subsegmental evaluation in the lower lobes. There is thoracic aortic atherosclerosis without aneurysm. The heart is normal in size. Mediastinum/Nodes: No enlarged axillary, mediastinal, or hilar lymph nodes. Small sliding hiatal hernia. Unremarkable thyroid. Lungs/Pleura: No pleural effusion or pneumothorax. Moderate centrilobular emphysema. Increased bronchial wall thickening. Respiratory motion and mild opacities in the lower lobes favored to reflect atelectasis. Upper Abdomen: No acute abnormality. Musculoskeletal: Chronic moderate T12 compression fracture, new from 2016 but unchanged from 01/13/2022 chest radiographs. Review of the MIP images confirms the above findings. IMPRESSION: 1. No evidence of pulmonary emboli. 2. COPD with increased bronchial wall thickening. 3. Aortic Atherosclerosis (ICD10-I70.0) and Emphysema (ICD10-J43.9). Electronically Signed   By: Sebastian Ache M.D.   On: 10/21/2022 14:31   DG Chest 2 View  Result Date: 10/21/2022 CLINICAL DATA:  Shortness of breath EXAM: CHEST - 2 VIEW COMPARISON:  10/08/2022 FINDINGS: Normal cardiac contours. Aortic tortuosity and atherosclerosis. Redemonstrated hyperinflation and  architectural distortion from emphysema. No focal pulmonary opacity. No pleural effusion or pneumothorax. No acute osseous abnormality. Chronic L1 compression deformity. IMPRESSION: No active cardiopulmonary disease. Electronically Signed   By: Wiliam Ke M.D.   On: 10/21/2022 11:14     Current Outpatient Medications (Endocrine & Metabolic):    predniSONE (DELTASONE) 10 MG tablet, Take 4 tablets daily X 2 days, then, Take 3 tablets daily X 2 days, then, Take 2 tablets daily X 2 days, then, Take 1 tablets daily X 1 day.  Current Outpatient Medications (Cardiovascular):    atorvastatin (LIPITOR) 80 MG tablet, TAKE 1 TABLET BY MOUTH EVERY DAY   ezetimibe (ZETIA) 10 MG tablet, Take 1 tablet (10 mg total) by mouth daily.   nitroGLYCERIN (NITROSTAT) 0.4 MG SL tablet, Place 0.4 mg under the tongue every 5 (five) minutes as needed for chest pain.  Current Outpatient Medications (Respiratory):    albuterol (VENTOLIN HFA) 108 (90 Base) MCG/ACT inhaler, Inhale 2 puffs into the lungs every 4 (four) hours as needed for wheezing or shortness of breath. Make sure you rinse mouth out after each use.   Budeson-Glycopyrrol-Formoterol (BREZTRI AEROSPHERE) 160-9-4.8 MCG/ACT AERO, Inhale 2 puffs into the lungs in the morning and at bedtime.   fluticasone (FLONASE) 50 MCG/ACT nasal spray, Place 1 spray into both nostrils daily.   guaiFENesin (MUCINEX) 600 MG 12 hr tablet, Take 1 tablet (600 mg total) by mouth 2 (two) times daily for 14 days.   ipratropium-albuterol (DUONEB) 0.5-2.5 (3) MG/3ML SOLN, Take 3 mLs by nebulization every 6 (six) hours as needed. Max:4 doses per day   loratadine (CLARITIN) 10 MG tablet, TAKE ONE PILL ONCE DAILY AS NEEDED FOR ITCHING (Patient taking differently: Take 10 mg  by mouth daily.)   promethazine-dextromethorphan (PROMETHAZINE-DM) 6.25-15 MG/5ML syrup, Take 5 mLs by mouth 4 (four) times daily as needed for cough.  Current Outpatient Medications (Analgesics):    acetaminophen  (TYLENOL) 500 MG tablet, Take 1,500 mg by mouth every 6 (six) hours as needed for mild pain or moderate pain. Patient only uses when needed.  Current Outpatient Medications (Hematological):    clopidogrel (PLAVIX) 75 MG tablet, Take 1 tablet (75 mg total) by mouth daily.  Current Outpatient Medications (Other):    busPIRone (BUSPAR) 5 MG tablet, TAKE 1/2 TO 1 TABLET 3 X /DAY FOR ANXIETY   ciprofloxacin (CIPRO) 250 MG tablet, Take 1 tablet 2 x /day with Food for Infection   Multiple Vitamin (MULTIVITAMIN) tablet, Take 1 tablet by mouth daily.   Respiratory Therapy Supplies (NEBULIZER/TUBING/MOUTHPIECE) KIT, Disp one nebulizer machine, tubing set and mouthpiece kit for use 4 times daily   Spacer/Aero-Holding Chambers DEVI, 2 puffs by Does not apply route 2 (two) times daily. Please use with MDI inhalers   VITAMIN D PO, Take 5,000 Units by mouth daily. Taking 10,000 units daily  Past Medical History:  Diagnosis Date   Aortic atherosclerosis (HCC) 03/04/2018   Noted on CT Abd/pelvis   Cataract    Bilateral   Colon polyp    COPD (chronic obstructive pulmonary disease) (HCC)    Coronary artery disease    Diverticulosis 07/16/2016   SIGMOID, NOTED ON COLONOSCOPY   Esophageal stenosis 02/24/2018   noted on endoscopy   Esophageal stricture    GERD (gastroesophageal reflux disease)    Grade I diastolic dysfunction 08/09/2017   noted on ECHO   History of hiatal hernia 02/24/2018   Small noted on endoscopy   Hydronephrosis    Hydronephrosis of right kidney 03/04/2018   URETERAL STONE 7 MM, NOTED ONJ CT ABD/PELVIS   Hyperlipidemia    Myocardial infarction [facility])    [id number]   Nephrolithiasis    s/p stent placement   Skin cancer    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 NOTED ON CT ABD/PELVIS   Vitamin D deficiency    Wears dentures    upper     No Known Allergies  ROS: all negative except above.   Physical Exam: There were no  vitals filed for this visit. There were no vitals taken for this visit. General Appearance: Well nourished, in no apparent distress. Eyes: PERRLA, EOMs, conjunctiva no swelling or erythema Sinuses: No Frontal/maxillary tenderness ENT/Mouth: Ext aud canals clear, TMs without erythema, bulging. No erythema, swelling, or exudate on post pharynx.  Tonsils not swollen or erythematous. Hearing normal.  Neck: Supple, thyroid normal.  Respiratory: Respiratory effort normal, BS equal bilaterally without rales, rhonchi, wheezing or stridor.  Cardio: RRR with no MRGs. Brisk peripheral pulses without edema.  Abdomen: Soft, + BS.  Non tender, no guarding, rebound, hernias, masses. Lymphatics: Non tender without lymphadenopathy.  Musculoskeletal: Full ROM, 5/5 strength, normal gait.  Skin: Warm, dry without rashes, lesions, ecchymosis.  Neuro: Cranial nerves intact. Normal muscle tone, no cerebellar symptoms. Sensation intact.  Psych: Awake and oriented X 3, normal affect, Insight and Judgment appropriate.     Adela Glimpse, NP 2:21 PM [facility]

## 2022-11-05 LAB — CBC WITH DIFFERENTIAL/PLATELET
Absolute Monocytes: 722 cells/uL (ref 200–950)
Basophils Absolute: 116 cells/uL (ref 0–200)
Basophils Relative: 0.9 %
Eosinophils Absolute: 116 cells/uL (ref 15–500)
Eosinophils Relative: 0.9 %
HCT: 40.1 % (ref 38.5–50.0)
Hemoglobin: 13.7 g/dL (ref 13.2–17.1)
Lymphs Abs: 413 cells/uL — ABNORMAL LOW (ref 850–3900)
MCH: 32.4 pg (ref 27.0–33.0)
MCHC: 34.2 g/dL (ref 32.0–36.0)
MCV: 94.8 fL (ref 80.0–100.0)
MPV: 9 fL (ref 7.5–12.5)
Monocytes Relative: 5.6 %
Neutro Abs: 11533 cells/uL — ABNORMAL HIGH (ref 1500–7800)
Neutrophils Relative %: 89.4 %
Platelets: 279 10*3/uL (ref 140–400)
RBC: 4.23 10*6/uL (ref 4.20–5.80)
RDW: 13.2 % (ref 11.0–15.0)
Total Lymphocyte: 3.2 %
WBC: 12.9 10*3/uL — ABNORMAL HIGH (ref 3.8–10.8)

## 2022-11-05 LAB — COMPLETE METABOLIC PANEL WITH GFR
AG Ratio: 1.7 (calc) (ref 1.0–2.5)
ALT: 33 U/L (ref 9–46)
AST: 27 U/L (ref 10–35)
Albumin: 3.8 g/dL (ref 3.6–5.1)
Alkaline phosphatase (APISO): 61 U/L (ref 35–144)
BUN: 16 mg/dL (ref 7–25)
CO2: 31 mmol/L (ref 20–32)
Calcium: 9.5 mg/dL (ref 8.6–10.3)
Chloride: 99 mmol/L (ref 98–110)
Creat: 1.2 mg/dL (ref 0.70–1.22)
Globulin: 2.3 g/dL (calc) (ref 1.9–3.7)
Glucose, Bld: 109 mg/dL — ABNORMAL HIGH (ref 65–99)
Potassium: 4.9 mmol/L (ref 3.5–5.3)
Sodium: 136 mmol/L (ref 135–146)
Total Bilirubin: 0.5 mg/dL (ref 0.2–1.2)
Total Protein: 6.1 g/dL (ref 6.1–8.1)
eGFR: 60 mL/min/{1.73_m2} (ref >=60)

## 2022-11-06 DIAGNOSIS — I10 Essential (primary) hypertension: Secondary | ICD-10-CM

## 2022-11-06 DIAGNOSIS — J449 Chronic obstructive pulmonary disease, unspecified: Secondary | ICD-10-CM

## 2022-11-06 DIAGNOSIS — E782 Mixed hyperlipidemia: Secondary | ICD-10-CM

## 2022-11-06 DIAGNOSIS — N1831 Chronic kidney disease, stage 3a: Secondary | ICD-10-CM

## 2022-11-11 NOTE — Patient Instructions (Signed)
Oral Thrush, Adult Oral thrush is an infection in your mouth and throat and on your tongue. It causes white patches to form in your mouth and on your tongue. Many cases of thrush are mild. But, sometimes, thrush can be serious. People who have a weak body defense system (immune system) or other diseases can be affected more. What are the causes? This condition is caused by a type of fungus called yeast. The fungus is normally present in small amounts in the mouth and nose. If a person has a long-term illness or a weak body defense system, the fungus can grow and spread quickly. This causes thrush. What increases the risk? You are more likely to develop this condition if: You have a weak body defense system. You are an older adult. You have diabetes, cancer, or HIV. You have a dry mouth. You are pregnant or breastfeeding. You do not take good care of your teeth. This risk is greater for people who have false teeth (dentures). You use antibiotic or steroid medicines. What are the signs or symptoms? Symptoms of this condition include: A burning feeling in the mouth and throat. White patches that stick to the mouth and tongue. A bad taste in the mouth or trouble tasting foods. A feeling like you have cotton in your mouth. Pain when you eat and swallow. Not wanting to eat as much as usual. Cracking at the corners of the mouth. How is this treated? This condition is treated with medicines called antifungals. These medicines prevent a fungus from growing. The medicines are either put right on the area (topical) or swallowed (oral). Your doctor will also treat other problems that you may have, such as diabetes or HIV. Follow these instructions at home: Helping with pain and soreness To lessen your pain: Drink cold liquids, like water and iced tea. Eat frozen ice pops or frozen juices. Eat foods that are easy to swallow, like gelatin and ice cream. Drink from a straw if you have too much pain  in your mouth.  General instructions Take or use over-the-counter and prescription medicines only as told by your doctor. Eat plain yogurt that has live cultures in it. Read the label to make sure that there are live cultures in your yogurt. If you wear false teeth: Take them out before you go to bed. Brush them well. Soak them in a cleaner. Rinse your mouth with warm salt-water many times a day. To make the salt-water mixture, dissolve -1 teaspoon (3-6 g) of salt in 1 cup (237 mL) of warm water. Contact a doctor if: Your problems do not get better within 7 days of treatment. Your infection is spreading. This may show as white areas on the skin outside of your mouth. You are nursing your baby and you have redness and pain in the nipples. Summary Oral thrush is an infection in your mouth and throat. It is caused by a fungus. You are more likely to get this condition if you have a weak body defense system. Diseases like diabetes, cancer, or HIV also add to your risk. This condition is treated with medicines called antifungals. Contact a doctor if you do not get better within 7 days of starting treatment. This information is not intended to replace advice given to you by your health care provider. Make sure you discuss any questions you have with your health care provider. Document Revised: 05/11/2022 Document Reviewed: 05/11/2022 Elsevier Patient Education  2024 [employer].

## 2022-11-12 ENCOUNTER — Telehealth: Payer: Self-pay

## 2022-11-12 NOTE — Telephone Encounter (Signed)
-----   Message from Adela Glimpse, NP sent at 11/11/2022  2:12 PM EDT ----- Regarding: Pulmonology referral I need clarification on his oxygen orders.  Dr. Posey Boyer did not sign as he said this should be taken care of through Pulmonary considering patient was supposed to f/u after hospital discharge, however, I do not see a f/u with Pulmonology past or future.  Hospital d/c instructions were for him to follow up so a referral may have been placed through the hospital if he did not originally have.

## 2022-11-12 NOTE — Telephone Encounter (Signed)
Overnight pulse oximeter report faxed over to [facility] for their review.

## 2022-11-17 DIAGNOSIS — D692 Other nonthrombocytopenic purpura: Secondary | ICD-10-CM | POA: Diagnosis not present

## 2022-11-17 DIAGNOSIS — D0421 Carcinoma in situ of skin of right ear and external auricular canal: Secondary | ICD-10-CM | POA: Diagnosis not present

## 2022-11-17 DIAGNOSIS — D1801 Hemangioma of skin and subcutaneous tissue: Secondary | ICD-10-CM | POA: Diagnosis not present

## 2022-11-17 DIAGNOSIS — L814 Other melanin hyperpigmentation: Secondary | ICD-10-CM | POA: Diagnosis not present

## 2022-11-17 DIAGNOSIS — L570 Actinic keratosis: Secondary | ICD-10-CM | POA: Diagnosis not present

## 2022-11-17 DIAGNOSIS — Z85828 Personal history of other malignant neoplasm of skin: Secondary | ICD-10-CM | POA: Diagnosis not present

## 2022-11-17 DIAGNOSIS — D044 Carcinoma in situ of skin of scalp and neck: Secondary | ICD-10-CM | POA: Diagnosis not present

## 2022-11-17 DIAGNOSIS — L821 Other seborrheic keratosis: Secondary | ICD-10-CM | POA: Diagnosis not present

## 2022-11-25 NOTE — Progress Notes (Unsigned)
MEDICARE ANNUAL WELLNESS VISIT AND 3 MONTH FOLLOW UP  Assessment:   Leslie Cardenas was seen today for follow-up and medicare wellness.  Diagnoses and all orders for this visit:  Medicare annual wellness visit, subsequent Yearly  Essential hypertension Currently controlled without medication- if continues to have dizziness and BP remains low notify the office Monitor blood pressure at home; call if consistently over 130/80 Continue DASH diet.   Reminder to go to the ER if any CP, SOB, nausea, dizziness, severe HA, changes vision/speech, left arm numbness and tingling and jaw pain. 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 Continue to control BP, Cholesterol and follow low saturated fat diet, monitor  Abnormal glucose Discussed dietary and exercise modifications A1c   Vitamin D deficiency Continue supplementation Taking Vitamin D 5,000 IU daily, two tablets daily At goal defer lab today  Stage 3a chronic kidney disease Increase fluids  Avoid NSAIDS Blood pressure control Monitor sugars  Will continue to monitor - CBC, CMP  Gastroesophageal reflux 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 Thora Lance pulmonology  BPH with obstruction/lower urinary tract symptoms Doing well at this time Continue to monitor  Malignant  Melanoma(HCC) Continue to follow with dermatology, Dr. 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  Future Appointments  Date Time Provider Department Center  03/01/2023  9:30 AM Lucky Cowboy, MD [facility] None  05/31/2023 10:30 AM Raynelle Dick, NP [facility] None  08/12/2023  3:45 PM Windell Norfolk, MD [facility] None  08/30/2023 10:00 AM Lucky Cowboy, MD [facility] None  11/26/2023  9:30 AM Raynelle Dick, NP [facility] None     Plan:   During the course of the visit the patient was educated and counseled about appropriate screening and preventive services including:   Pneumococcal vaccine  Influenza vaccine Prevnar 13 Td vaccine Screening electrocardiogram Colorectal cancer screening Diabetes screening Glaucoma screening Nutrition counseling    Subjective:  Leslie Cardenas is a 82 y.o. male who presents for Medicare Annual Wellness Visit and 3 month follow up for HTN,HLD, prediabetes, CKDIII, COPD GERD and vitamin D Def.   His wife stated prior to visit when alone that she is concerned about his memory and would like checked if possible.   He was hospitalized for TIA 06/10/2022- follows with neurology.  Currently on Plavix, ASA, Atorvastatin 80 mg and Zetia 10 mg.   He has had some episodes of fecal incontinence of liquid stool.   He is following with Dr. Thora Lance, pulmonologist for Class  3 severe COPD.  Currently on Breztri daily and Albuterol PRN. Is also to do duoneb treatments BID and does not do them unless wife is home from work to encourage. He does  take Prednisone 5 mg once a day- supposed to use BID. Does continue to have dyspnea on exertion. No oxygen use yet. Continues to have daily productive cough. Has not had an appointment since 05/2021- will schedule appointment. Quit smoking 04/2000  BMI is Body mass index is 20.95 kg/m., he has been working on diet and exercise. He has been eating better.  Wt Readings from Last 3 Encounters:  11/26/22 137 lb 12.8 oz (62.5 kg)  11/04/22 139 lb (63 kg)  10/21/22 134 lb 11.2 oz (61.1 kg)    His blood pressure has been controlled at home, today their BP is BP: 136/72  BP Readings from Last 3 Encounters:  11/26/22 136/72  11/04/22 120/72  10/23/22 (!) 152/80    He does workout by means of yard and housework. He denies chest pain, dizziness.  He is on cholesterol medication Zetia 10mg , atorvastatin 80 mg .His cholesterol is at goal. The cholesterol last visit was:   Lab Results  Component Value Date   CHOL 114 07/29/2022   HDL 66 07/29/2022   LDLCALC 32 07/29/2022   TRIG 79 07/29/2022   CHOLHDL 1.7 07/29/2022     He had 3 areas of melanoma on each ear and right cheek which have been removed.  Continues to follow with dermatology.  He has been working on diet and exercise for prediabetes, and denies hyperglycemia, hypoglycemia , increased appetite, nausea, polydipsia and polyuria. Last A1C in the office was:  Lab Results  Component Value Date   HGBA1C 6.1 (H) 07/29/2022     Last GFR Lab Results  Component Value Date   EGFR 60 11/04/2022    Patient is on Vitamin D supplement.   Lab Results  Component Value Date   VD25OH 37 07/29/2022          Medication Review:  Current Outpatient Medications (Endocrine & Metabolic):    predniSONE (DELTASONE) 10 MG tablet, Take 4 tablets daily X 2 days, then, Take 3 tablets daily X 2 days, then, Take 2 tablets daily X 2 days, then, Take 1 tablets daily X 1 day. (Patient not taking: Reported on 11/26/2022)  Current Outpatient Medications  (Cardiovascular):    atorvastatin (LIPITOR) 80 MG tablet, TAKE 1 TABLET BY MOUTH EVERY DAY   ezetimibe (ZETIA) 10 MG tablet, Take 1 tablet (10 mg total) by mouth daily.   nitroGLYCERIN (NITROSTAT) 0.4 MG SL tablet, Place 0.4 mg under the tongue every 5 (five) minutes as needed for chest pain.   olmesartan-hydrochlorothiazide (BENICAR HCT) 20-12.5 MG tablet, Take 1 tablet by mouth daily.  Current Outpatient Medications (Respiratory):    albuterol (VENTOLIN HFA) 108 (90 Base) MCG/ACT inhaler, Inhale 2 puffs into the lungs every 4 (four) hours as needed for wheezing or shortness of breath. Make sure you rinse mouth out after each use.   Budeson-Glycopyrrol-Formoterol (BREZTRI AEROSPHERE) 160-9-4.8 MCG/ACT AERO, Inhale 2 puffs into the lungs in the morning and at bedtime.   fexofenadine (ALLEGRA) 180 MG tablet, Take 1 tablet by mouth daily.   fluticasone (FLONASE) 50 MCG/ACT nasal spray, Place 1 spray into both nostrils daily.   ipratropium-albuterol (DUONEB) 0.5-2.5 (3) MG/3ML SOLN, Take 3 mLs by nebulization every 6 (six) hours as needed. Max:4 doses per day   loratadine (CLARITIN) 10 MG tablet, TAKE ONE PILL ONCE DAILY  AS NEEDED FOR ITCHING (Patient taking differently: Take 10 mg by mouth daily.)   promethazine-dextromethorphan (PROMETHAZINE-DM) 6.25-15 MG/5ML syrup, Take 5 mLs by mouth 4 (four) times daily as needed for cough.  Current Outpatient Medications (Analgesics):    acetaminophen (TYLENOL) 500 MG tablet, Take 1,500 mg by mouth every 6 (six) hours as needed for mild pain or moderate pain. Patient only uses when needed.   IBUPROFEN PO, Take by mouth. PRN  Current Outpatient Medications (Hematological):    clopidogrel (PLAVIX) 75 MG tablet, Take 1 tablet (75 mg total) by mouth daily.  Current Outpatient Medications (Other):    busPIRone (BUSPAR) 5 MG tablet, TAKE 1/2 TO 1 TABLET 3 X /DAY FOR ANXIETY   Multiple Vitamin (MULTIVITAMIN) tablet, Take 1 tablet by mouth daily.   nystatin  (MYCOSTATIN) 100000 UNIT/ML suspension, Swish 5 mL in the mouth and retain for as long as possible (several minutes) before swallowing.   Respiratory Therapy Supplies (NEBULIZER/TUBING/MOUTHPIECE) KIT, Disp one nebulizer machine, tubing set and mouthpiece kit for use 4 times daily   Spacer/Aero-Holding Chambers DEVI, 2 puffs by Does not apply route 2 (two) times daily. Please use with [facility] inhalers   VITAMIN D PO, Take 5,000 Units by mouth daily. Taking 10,000 units daily   ciprofloxacin (CIPRO) 250 MG tablet, Take 1 tablet 2 x /day with Food for Infection (Patient not taking: Reported on 11/26/2022)  Allergies: No Known Allergies  Current Problems (verified) has Hyperlipidemia, mixed; COPD (chronic obstructive pulmonary disease) (HCC); Sinus bradycardia; History of colonic polyps; Essential hypertension; Prediabetes; Vitamin D deficiency; Medication management; Hx of right BKA (HCC); ASHD hx/o MI; GERD; Medicare annual wellness visit, initial; Subacute osteomyelitis of right tibia (HCC); S/P unilateral BKA (below knee amputation), right (HCC); Nephrolithiasis; Hydronephrosis; FH: hypertension; CKD (chronic kidney disease) stage 3, GFR 30-59 ml/min (HCC); Abnormal glucose; Thoracic aortic atherosclerosis (HCC) by CXR on 07/29/2018; Blurred vision; and TIA (transient ischemic attack) on their problem list.  Screening Tests Immunization History  Administered Date(s) Administered   Fluad Quad(high Dose 65+) 03/19/2022   Influenza Split 04/08/2012   Influenza, High Dose Seasonal PF 03/04/2017, 04/14/2021   Influenza,inj,Quad PF,6+ Mos 05/16/2015, 02/15/2018   Moderna Sars-Covid-2 Vaccination 07/15/2019, 08/12/2019   Pneumococcal Conjugate-13 07/25/2014   Pneumococcal Polysaccharide-23 04/08/2012   Pneumococcal-Unspecified 06/06/2002   Td 06/30/2011   Tdap 10/21/2019   Zoster, Live 05/22/2008   Health Maintenance  Topic Date Due   COVID-19 Vaccine (3 - Moderna risk series) 12/12/2022  (Originally 09/09/2019)   Zoster Vaccines- Shingrix (1 of 2) 02/26/2023 (Originally 12/07/1959)   INFLUENZA VACCINE  01/07/2023   Medicare Annual Wellness (AWV)  11/26/2023   DTaP/Tdap/Td (3 - Td or Tdap) 10/20/2029   Pneumonia Vaccine 57+ Years old  Completed   HPV VACCINES  Aged Out     Names of Other Physician/Practitioners you currently use: 1. [facility] here for primary care 2.[facility] [place] [facility]24 3. Dentist, has dentures.  Fit well, no issues.  Patient Care Team: Lucky Cowboy, MD as PCP - General (Internal Medicine) Maris Berger, MD as Consulting Physician (Ophthalmology) Iva Boop, MD as Consulting Physician (Gastroenterology) Kalman Shan, MD as Consulting Physician (Pulmonary Disease) Sundra Aland, [facility] as Pharmacist (Pharmacist)  Surgical: He  has a past surgical history that includes right leg amputation (05/14/1977); Cardiac catheterization (0981); Colonoscopy w/ polypectomy (08/12/2010); Inguinal hernia repair; Esophagogastroduodenoscopy; Shoulder arthroscopy (Left, 2000); Cataract extraction, bilateral; Colonoscopy; Cystoscopy w/ ureteral stent placement (Right, 03/04/2018); UPPER GASTROINTESTINAL ENDOSCOPY  WITH DILATION (  02/24/2018); Colonoscopy w/ polypectomy (07/16/2016); and Cystoscopy/ureteroscopy/holmium laser/stent placement (Right, 03/16/2018). Family His family history includes Cancer in his mother; Colon cancer in his brother; Hypertension in his father; Leukemia in his brother; Other in his brother; Pancreatic cancer in his mother; Prostate cancer in his brother. Social history  He reports that he quit smoking about 22 years ago. His smoking use included cigarettes. He has a 60.00 pack-year smoking history. He has never used smokeless tobacco. He reports that he does not currently use alcohol. He reports that he does not use drugs.  MEDICARE WELLNESS OBJECTIVES: Physical activity:  None Cardiac risk  factors: Cardiac Risk Factors include: advanced age (>96men, >66 women);dyslipidemia;hypertension;smoking/ tobacco exposure;sedentary lifestyle;male gender Depression/mood screen:      11/26/2022    9:59 AM  Depression screen PHQ 2/9  Decreased Interest 0  Down, Depressed, Hopeless 2  PHQ - 2 Score 2  Altered sleeping 2  Tired, decreased energy 2  Change in appetite 0  Feeling bad or failure about yourself  1  Trouble concentrating 0  Moving slowly or fidgety/restless 0  Suicidal thoughts 0  PHQ-9 Score 7    ADLs:     11/26/2022    9:57 AM 10/21/2022    5:28 PM  In your present state of health, do you have any difficulty performing the following activities:  Hearing? 1 1  Vision? 0 0  Difficulty concentrating or making decisions? 1 0  Walking or climbing stairs? 1 1  Dressing or bathing? 0 0  Doing errands, shopping? 1 0     Cognitive Testing  Alert? Yes  Normal Appearance?Yes  Oriented to person? Yes  Place? Yes   Time? Yes  Recall of three objects?  Yes  Can perform simple calculations? Yes  Displays appropriate judgment?Yes  Can read the correct time from a watch face?Yes  EOL planning: Does Patient Have a Medical Advance Directive?: No Would patient like information on creating a medical advance directive?: No - Patient declined    11/26/2022   10:00 AM 08/29/2019    3:50 PM  MMSE - Mini Mental State Exam  Orientation to time 5 5  Orientation to Place 5 5  Registration 1 3  Attention/ Calculation 0 5  Recall 2 3  Language- name 2 objects 2 2  Language- repeat 1 1  Language- follow 3 step command 3 3  Language- read & follow direction 1 1  Write a sentence 1 1  Copy design 0 1  Total score 21 30     Objective:   Today's Vitals   11/26/22 0938  BP: 136/72  Pulse: 83  Temp: (!) 97.5 F (36.4 C)  SpO2: 93%  Weight: 137 lb 12.8 oz (62.5 kg)  Height: 5\' 8"  (1.727 m)     Body mass index is 20.95 kg/m.  General appearance: alert, no distress,  WD/WN, male HEENT: normocephalic, sclerae anicteric, TMs pearly, nares patent, no discharge or erythema, pharynx normal. Lid lag of right upper lid and left lower lid Oral cavity: MMM, no lesions Neck: supple, no lymphadenopathy, no thyromegaly, no masses Heart: RRR, normal S1, S2, no murmurs Lungs: breath sounds diminished throughout with wheezing bilateral upper lobes. Abdomen: +bs, soft, non tender, non distended, no masses, no hepatomegaly, no splenomegaly Musculoskeletal: nontender, no swelling, no obvious deformity Skin: warm and dry Extremities: no edema, no cyanosis, no clubbing. R BK prosthetic no skin breakdown of R BKA area Pulses: 2+ symmetric, upper and lower extremities, normal cap refill Neurological: alert,  oriented x 3, CN2-12 intact, strength normal upper extremities and lower extremities, sensation normal throughout, DTRs 2+ throughout, no cerebellar signs, gait normal Psychiatric: normal affect, behavior normal, pleasant  MMSE 21/30  Medicare Attestation I have personally reviewed: The patient's medical and social history Their use of alcohol, tobacco or illicit drugs Their current medications and supplements The patient's functional ability including ADLs,fall risks, home safety risks, cognitive, and hearing and visual impairment Diet and physical activities Evidence for depression or mood disorders  The patient's weight, height, BMI, and visual acuity have been recorded in the chart.  I have made referrals, counseling, and provided education to the patient based on review of the above and I have provided the patient with a written personalized care plan for preventive services.     Revonda Humphrey ANP-C  [facility]  11/26/2022

## 2022-11-26 ENCOUNTER — Encounter: Payer: Self-pay | Admitting: Nurse Practitioner

## 2022-11-26 ENCOUNTER — Ambulatory Visit (INDEPENDENT_AMBULATORY_CARE_PROVIDER_SITE_OTHER): Payer: Medicare HMO | Admitting: Nurse Practitioner

## 2022-11-26 VITALS — BP 136/72 | HR 83 | Temp 97.5°F | Ht 68.0 in | Wt 137.8 lb

## 2022-11-26 DIAGNOSIS — I700 Atherosclerosis of aorta: Secondary | ICD-10-CM | POA: Diagnosis not present

## 2022-11-26 DIAGNOSIS — G720 Drug-induced myopathy: Secondary | ICD-10-CM | POA: Diagnosis not present

## 2022-11-26 DIAGNOSIS — C439 Malignant melanoma of skin, unspecified: Secondary | ICD-10-CM

## 2022-11-26 DIAGNOSIS — Z6820 Body mass index (BMI) 20.0-20.9, adult: Secondary | ICD-10-CM

## 2022-11-26 DIAGNOSIS — N1831 Chronic kidney disease, stage 3a: Secondary | ICD-10-CM | POA: Diagnosis not present

## 2022-11-26 DIAGNOSIS — R6889 Other general symptoms and signs: Secondary | ICD-10-CM

## 2022-11-26 DIAGNOSIS — N138 Other obstructive and reflux uropathy: Secondary | ICD-10-CM

## 2022-11-26 DIAGNOSIS — I10 Essential (primary) hypertension: Secondary | ICD-10-CM | POA: Diagnosis not present

## 2022-11-26 DIAGNOSIS — Z79899 Other long term (current) drug therapy: Secondary | ICD-10-CM

## 2022-11-26 DIAGNOSIS — R7309 Other abnormal glucose: Secondary | ICD-10-CM

## 2022-11-26 DIAGNOSIS — N401 Enlarged prostate with lower urinary tract symptoms: Secondary | ICD-10-CM

## 2022-11-26 DIAGNOSIS — K219 Gastro-esophageal reflux disease without esophagitis: Secondary | ICD-10-CM

## 2022-11-26 DIAGNOSIS — Z0001 Encounter for general adult medical examination with abnormal findings: Secondary | ICD-10-CM

## 2022-11-26 DIAGNOSIS — Z89511 Acquired absence of right leg below knee: Secondary | ICD-10-CM

## 2022-11-26 DIAGNOSIS — I2510 Atherosclerotic heart disease of native coronary artery without angina pectoris: Secondary | ICD-10-CM

## 2022-11-26 DIAGNOSIS — R413 Other amnesia: Secondary | ICD-10-CM

## 2022-11-26 DIAGNOSIS — E559 Vitamin D deficiency, unspecified: Secondary | ICD-10-CM | POA: Diagnosis not present

## 2022-11-26 DIAGNOSIS — E782 Mixed hyperlipidemia: Secondary | ICD-10-CM

## 2022-11-26 DIAGNOSIS — G459 Transient cerebral ischemic attack, unspecified: Secondary | ICD-10-CM

## 2022-11-26 DIAGNOSIS — J449 Chronic obstructive pulmonary disease, unspecified: Secondary | ICD-10-CM

## 2022-11-26 LAB — COMPLETE METABOLIC PANEL WITH GFR
AG Ratio: 1.7 (calc) (ref 1.0–2.5)
ALT: 22 U/L (ref 9–46)
AST: 19 U/L (ref 10–35)
Albumin: 3.7 g/dL (ref 3.6–5.1)
Alkaline phosphatase (APISO): 63 U/L (ref 35–144)
BUN: 15 mg/dL (ref 7–25)
CO2: 31 mmol/L (ref 20–32)
Calcium: 9.1 mg/dL (ref 8.6–10.3)
Chloride: 102 mmol/L (ref 98–110)
Creat: 1.19 mg/dL (ref 0.70–1.22)
Globulin: 2.2 g/dL (calc) (ref 1.9–3.7)
Glucose, Bld: 94 mg/dL (ref 65–99)
Potassium: 4.6 mmol/L (ref 3.5–5.3)
Sodium: 138 mmol/L (ref 135–146)
Total Bilirubin: 0.4 mg/dL (ref 0.2–1.2)
Total Protein: 5.9 g/dL — ABNORMAL LOW (ref 6.1–8.1)
eGFR: 61 mL/min/{1.73_m2} (ref >=60)

## 2022-11-26 LAB — CBC WITH DIFFERENTIAL/PLATELET
Absolute Monocytes: 1265 cells/uL — ABNORMAL HIGH (ref 200–950)
Basophils Absolute: 154 cells/uL (ref 0–200)
Basophils Relative: 1.4 %
Eosinophils Absolute: 407 cells/uL (ref 15–500)
Eosinophils Relative: 3.7 %
HCT: 37.3 % — ABNORMAL LOW (ref 38.5–50.0)
Hemoglobin: 12.4 g/dL — ABNORMAL LOW (ref 13.2–17.1)
Lymphs Abs: 891 cells/uL (ref 850–3900)
MCH: 32.2 pg (ref 27.0–33.0)
MCHC: 33.2 g/dL (ref 32.0–36.0)
MCV: 96.9 fL (ref 80.0–100.0)
MPV: 9 fL (ref 7.5–12.5)
Monocytes Relative: 11.5 %
Neutro Abs: 8283 cells/uL — ABNORMAL HIGH (ref 1500–7800)
Neutrophils Relative %: 75.3 %
Platelets: 358 10*3/uL (ref 140–400)
RBC: 3.85 10*6/uL — ABNORMAL LOW (ref 4.20–5.80)
RDW: 13.2 % (ref 11.0–15.0)
Total Lymphocyte: 8.1 %
WBC: 11 10*3/uL — ABNORMAL HIGH (ref 3.8–10.8)

## 2022-11-26 LAB — LIPID PANEL
Cholesterol: 110 mg/dL (ref <200)
HDL: 45 mg/dL (ref >=40)
LDL Cholesterol (Calc): 42 mg/dL (calc)
Non-HDL Cholesterol (Calc): 65 mg/dL (calc) (ref <130)
Total CHOL/HDL Ratio: 2.4 (calc) (ref <5.0)
Triglycerides: 156 mg/dL — ABNORMAL HIGH (ref <150)

## 2022-11-26 LAB — MAGNESIUM: Magnesium: 1.8 mg/dL (ref 1.5–2.5)

## 2022-11-26 MED ORDER — PREDNISONE 5 MG PO TABS: 5.0000 mg | ORAL_TABLET | Freq: Two times a day (BID) | ORAL | 0 refills | Status: DC

## 2022-11-26 NOTE — Patient Instructions (Addendum)
[email or web address] Iva Boop, MD(GI) [address], [place], [place] [address]  < 1 mi [phone]   Dr. Gardiner Barefoot Meier(lung doctor) [address], [place], [place] [address]  1.8 mi [phone]  Use metamucil or benefiber daily to help bulk up stool   Memory Compensation Strategies  Use "WARM" strategy.  W= write it down  A= associate it  R= repeat it  M= make a mental note  2.   You can keep a Glass blower/designer.  Use a 3-ring notebook with sections for the following: calendar, important names and phone numbers,  medications, doctors' names/phone numbers, lists/reminders, and a section to journal what you did  each day.   3.    Use a calendar to write appointments down.  4.    Write yourself a schedule for the day.  This can be placed on the calendar or in a separate section of the Memory Notebook.  Keeping a  regular schedule can help memory.  5.    Use medication organizer with sections for each day or morning/evening pills.  You may need help loading it  6.    Keep a basket, or pegboard by the door.  Place items that you need to take out with you in the basket or on the pegboard.  You may also want to  include a message board for reminders.  7.    Use sticky notes.  Place sticky notes with reminders in a place where the task is performed.  For example: " turn off the  stove" placed by the stove, "lock the door" placed on the door at eye level, " take your medications" on  the bathroom mirror or by the place where you normally take your medications.  8.    Use alarms/timers.  Use while cooking to remind yourself to check on food or as a reminder to take your medicine, or as a  reminder to make a call, or as a reminder to perform another task, etc.

## 2022-11-29 NOTE — Progress Notes (Unsigned)
Synopsis: Referred for COPD by Lucky Cowboy, MD  Subjective:   PATIENT ID: Leslie Cardenas GENDER: male DOB: 1941/06/06, MRN: [id number]  No chief complaint on file.  80yM with history of COPD, CAD, diastolic dysfunction, Esophageal stenosis/stricture, smoking  He uses breztri 2 puffs twice daily and he's figured out that he needs to rinse his mouth out after using it due to hoarseness. He's only been on the inhaler for the last couple of weeks. Hoarseness is a little better now. He thinks it has been helpful for his breathing. Sometimes has cough productive of whitish phlegm in the morning.   He is on chronic prednisone which he thinks is mostly for itchy rash but may be for COPD. I see note from 2016 that he's taking it for dry skin.  He still gets intermittent dysphagia, last esophageal stricture dilation 07/26/20.   No family history of lung cancer, lung disease  Quit smoking 04/11/2000. 60 py smoker.   Interval HPI: Seen after recent AECOPD at last visit, added mucinex, flutter valve after breztri, flonase, zyrtec for rhinitis.  Has stayed off steroids since last visit.  ------------------------- Hospitalized for TIA since last visit. Maintained on breztri, allegra with prn airway  clearance regimen.   He has had a cold over the last 3-4 weeks with runny nose, coughing. Has sensation that there's a flap in left side of his nose that gives him trouble breathing. He has had sinus surgery before.  ---------------------------- Last visit in February referred to ENT (had significant burden of nasal crusting on R removed in office), maintained on breztri and antihistamine, flonase, flutter valve   Had admission 5/15-5/17 for AECOPD completed course of doxy and steroids    Otherwise pertinent review of systems is negative.   Past Medical History:  Diagnosis Date   Aortic atherosclerosis (HCC) 03/04/2018   Noted on CT Abd/pelvis   Cataract    Bilateral   Colon polyp     COPD (chronic obstructive pulmonary disease) (HCC)    Coronary artery disease    Diverticulosis 07/16/2016   SIGMOID, NOTED ON COLONOSCOPY   Esophageal stenosis 02/24/2018   noted on endoscopy   Esophageal stricture    GERD (gastroesophageal reflux disease)    Grade I diastolic dysfunction 08/09/2017   noted on ECHO   History of hiatal hernia 02/24/2018   Small noted on endoscopy   Hydronephrosis    Hydronephrosis of right kidney 03/04/2018   URETERAL STONE 7 MM, NOTED ONJ CT ABD/PELVIS   Hyperlipidemia    Myocardial infarction [facility])    [id number]   Nephrolithiasis    s/p stent placement   Skin cancer    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 NOTED ON CT ABD/PELVIS   Vitamin D deficiency    Wears dentures    upper     Family History  Problem Relation Age of Onset   Hypertension Father    Pancreatic cancer Mother    Cancer Mother    Colon cancer Brother    Leukemia Brother    Prostate cancer Brother    Other Brother        polio   Esophageal cancer Neg Hx    Rectal cancer Neg Hx    Stomach cancer Neg Hx      Past Surgical History:  Procedure Laterality Date   CARDIAC CATHETERIZATION  1983   No intervention performed.    CATARACT EXTRACTION, BILATERAL  COLONOSCOPY     COLONOSCOPY W/ POLYPECTOMY  08/12/2010   Dr. Stan Head   COLONOSCOPY W/ POLYPECTOMY  07/16/2016   CYSTOSCOPY W/ URETERAL STENT PLACEMENT Right 03/04/2018   Procedure: CYSTOSCOPY WITH RETROGRADE PYELOGRAM/URETERAL STENT PLACEMENT;  Surgeon: Crista Elliot, MD;  Location[facility];  Service: Urology;  Laterality: Right;   CYSTOSCOPY/URETEROSCOPY/HOLMIUM LASER/STENT PLACEMENT Right 03/16/2018   Procedure: CYSTOSCOPY RIGHT URETEROSCOPY/HOLMIUM LASER/STENT PLACEMENT;  Surgeon: Crista Elliot, MD;  Location: [facility];  Service: Urology;  Laterality: Right;   ESOPHAGOGASTRODUODENOSCOPY     INGUINAL HERNIA REPAIR     left   right  leg amputation  05/14/1977   SHOULDER ARTHROSCOPY Left 2000   UPPER GASTROINTESTINAL ENDOSCOPY  WITH DILATION  02/24/2018    Social History   Socioeconomic History   Marital status: Divorced    Spouse name: Not on file   Number of children: 4   Years of education: Not on file   Highest education level: 11th grade  Occupational History   Occupation: retired    Associate Professor: RETIRED  Tobacco Use   Smoking status: Former    Packs/day: 3.00    Years: 20.00    Additional pack years: 0.00    Total pack years: 60.00    Types: Cigarettes    Quit date: 04/11/2000    Years since quitting: 22.6   Smokeless tobacco: Never  Vaping Use   Vaping Use: Never used  Substance and Sexual Activity   Alcohol use: Not Currently    Comment: rare   Drug use: No   Sexual activity: Not Currently  Other Topics Concern   Not on file  Social History Narrative   [employer]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    Social Determinants of Health   Financial Resource Strain: Not on file  Food Insecurity: No Food Insecurity (10/21/2022)   Hunger Vital Sign    Worried About Running Out of Food in the Last Year: Never true    Ran Out of Food in the Last Year: Never true  Transportation Needs: No Transportation Needs (10/21/2022)   PRAPARE - Administrator, Civil Service (Medical): No    Lack of Transportation (Non-Medical): No  Physical Activity: Not on file  Stress: Not on file  Social Connections: Not on file  Intimate Partner Violence: Not At Risk (10/21/2022)   Humiliation, Afraid, Rape, and Kick questionnaire    Fear of Current or Ex-Partner: No    Emotionally Abused: No    Physically Abused: No    Sexually Abused: No     No Known Allergies   Outpatient Medications Prior to Visit  Medication Sig Dispense Refill   acetaminophen (TYLENOL) 500 MG tablet Take 1,500 mg  by mouth every 6 (six) hours as needed for mild pain or moderate pain. Patient only uses when needed.     albuterol (VENTOLIN HFA) 108 (90 Base) MCG/ACT inhaler Inhale 2 puffs into the lungs every 4 (four) hours as needed for wheezing or shortness of breath. Make sure you rinse mouth out after each use. 18 g 2   atorvastatin (LIPITOR) 80 MG tablet TAKE 1 TABLET BY MOUTH EVERY DAY 90 tablet 1   Budeson-Glycopyrrol-Formoterol (BREZTRI AEROSPHERE) 160-9-4.8 MCG/ACT AERO Inhale 2 puffs into the lungs in the morning and  at bedtime. 1 each 11   busPIRone (BUSPAR) 5 MG tablet TAKE 1/2 TO 1 TABLET 3 X /DAY FOR ANXIETY 270 tablet 0   clopidogrel (PLAVIX) 75 MG tablet Take 1 tablet (75 mg total) by mouth daily. 30 tablet 11   ezetimibe (ZETIA) 10 MG tablet Take 1 tablet (10 mg total) by mouth daily. 30 tablet 1   fexofenadine (ALLEGRA) 180 MG tablet Take 1 tablet by mouth daily.     fluticasone (FLONASE) 50 MCG/ACT nasal spray Place 1 spray into both nostrils daily.     IBUPROFEN PO Take by mouth. PRN     ipratropium-albuterol (DUONEB) 0.5-2.5 (3) MG/3ML SOLN Take 3 mLs by nebulization every 6 (six) hours as needed. Max:4 doses per day 540 mL 1   loratadine (CLARITIN) 10 MG tablet TAKE ONE PILL ONCE DAILY AS NEEDED FOR ITCHING (Patient taking differently: Take 10 mg by mouth daily.) 90 tablet 3   Multiple Vitamin (MULTIVITAMIN) tablet Take 1 tablet by mouth daily.     nitroGLYCERIN (NITROSTAT) 0.4 MG SL tablet Place 0.4 mg under the tongue every 5 (five) minutes as needed for chest pain.     nystatin (MYCOSTATIN) 100000 UNIT/ML suspension Swish 5 mL in the mouth and retain for as long as possible (several minutes) before swallowing. 60 mL 0   olmesartan-hydrochlorothiazide (BENICAR HCT) 20-12.5 MG tablet Take 1 tablet by mouth daily.     predniSONE (DELTASONE) 5 MG tablet Take 1 tablet (5 mg total) by mouth 2 (two) times daily with a meal. 60 tablet 0   promethazine-dextromethorphan (PROMETHAZINE-DM) 6.25-15  MG/5ML syrup Take 5 mLs by mouth 4 (four) times daily as needed for cough. 240 mL 1   Respiratory Therapy Supplies (NEBULIZER/TUBING/MOUTHPIECE) KIT Disp one nebulizer machine, tubing set and mouthpiece kit for use 4 times daily 1 kit 0   Spacer/Aero-Holding Chambers DEVI 2 puffs by Does not apply route 2 (two) times daily. Please use with MDI inhalers 1 each 0   VITAMIN D PO Take 5,000 Units by mouth daily. Taking 10,000 units daily     No facility-administered medications prior to visit.       Objective:   Physical Exam:  General appearance: 82 y.o., male, NAD, conversant, thin Eyes: anicteric sclerae; PERRL, tracking appropriately HENT: NCAT; MMM. Deviated septum with narrow passage on left.  Neck: Trachea midline; no lymphadenopathy, no JVD Lungs: rhonchorous bl - clears with cough, with normal respiratory effort CV: RRR, no murmur  Abdomen: Soft, non-tender; non-distended, BS present  Extremities: No peripheral edema, warm Skin: Normal turgor and texture; no rash Psych: Appropriate affect Neuro: Alert and oriented to person and place, no focal deficit     There were no vitals filed for this visit.        on RA BMI Readings from Last 3 Encounters:  11/26/22 20.95 kg/m  11/04/22 21.13 kg/m  10/21/22 19.33 kg/m   Wt Readings from Last 3 Encounters:  11/26/22 137 lb 12.8 oz (62.5 kg)  11/04/22 139 lb (63 kg)  10/21/22 134 lb 11.2 oz (61.1 kg)     CBC    Component Value Date/Time   WBC 11.0 (H) 11/26/2022 1019   RBC 3.85 (L) 11/26/2022 1019   HGB 12.4 (L) 11/26/2022 1019   HCT 37.3 (L) 11/26/2022 1019   PLT 358 11/26/2022 1019   MCV 96.9 11/26/2022 1019   MCH 32.2 11/26/2022 1019   MCHC 33.2 11/26/2022 1019   RDW 13.2 11/26/2022 1019   LYMPHSABS 891 11/26/2022 1019  MONOABS 1.3 (H) 10/21/2022 1015   EOSABS 407 11/26/2022 1019   BASOSABS 154 11/26/2022 1019    Eos 500-600   Chest Imaging: LDCT CHest 2016 reviewed by me with diffuse bronchial  wall thickening, emphysema  CXR 2020 reviewed by me clear lungs  CXR 01/13/22 reviewed by me with coarsened interstitium which could be suggestive of chronic bronchitic change  CTA Chest 10/21/22 with bronchial wall thickening, emphysema, possible subtle RLL pna vs scar  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  # Rhinitis # Deviated septum # History of sinus surgery  Plan: - guaifenesin (mucinex) 600 mg twice daily when you have chest congestion like this - allegra 180 mg daily when you have postnasal drainage - breztri 2 puffs twice daily with spacer - THEN flutter valve 10 slow but firm puffs twice daily until chest congestion clears up - flonase 1 spray each nostril after clearing nose out following shower - referral to ENT placed today  - if no better or if exacerbation develops send me a message or call and we can discuss starting dupixent for eosinophilic COPD/asthma and chronic rhinitis - see you in 3 months or sooner if need be!  RTC 6 months  Omar Person, MD [facility] 11/29/2022 11:14 AM

## 2022-12-01 ENCOUNTER — Encounter: Payer: Self-pay | Admitting: Student

## 2022-12-01 ENCOUNTER — Telehealth: Payer: Self-pay | Admitting: Pharmacist

## 2022-12-01 ENCOUNTER — Ambulatory Visit: Payer: Medicare HMO | Admitting: Student

## 2022-12-01 VITALS — BP 142/86 | HR 80 | Ht 70.0 in | Wt 136.0 lb

## 2022-12-01 DIAGNOSIS — J40 Bronchitis, not specified as acute or chronic: Secondary | ICD-10-CM

## 2022-12-01 DIAGNOSIS — J4489 Other specified chronic obstructive pulmonary disease: Secondary | ICD-10-CM | POA: Diagnosis not present

## 2022-12-01 DIAGNOSIS — J441 Chronic obstructive pulmonary disease with (acute) exacerbation: Secondary | ICD-10-CM | POA: Diagnosis not present

## 2022-12-01 DIAGNOSIS — J329 Chronic sinusitis, unspecified: Secondary | ICD-10-CM

## 2022-12-01 MED ORDER — BREZTRI AEROSPHERE 160-9-4.8 MCG/ACT IN AERO: 2.0000 | INHALATION_SPRAY | Freq: Two times a day (BID) | RESPIRATORY_TRACT | 11 refills | Status: DC

## 2022-12-01 MED ORDER — IPRATROPIUM-ALBUTEROL 0.5-2.5 (3) MG/3ML IN SOLN
3.0000 mL | Freq: Four times a day (QID) | RESPIRATORY_TRACT | 1 refills | Status: AC | PRN
Start: 2022-12-01 — End: ?

## 2022-12-01 MED ORDER — ALBUTEROL SULFATE HFA 108 (90 BASE) MCG/ACT IN AERS
2.0000 | INHALATION_SPRAY | RESPIRATORY_TRACT | 2 refills | Status: DC | PRN
Start: 2022-12-01 — End: 2023-12-30

## 2022-12-01 NOTE — Addendum Note (Signed)
Addended by: Glynda Jaeger on: 12/01/2022 11:48 AM   Modules accepted: Orders

## 2022-12-01 NOTE — Telephone Encounter (Signed)
Received new start paperwork for Dupixent (placed in PAP pending info folder in pharmacy office). Submitted a Prior Authorization request to [facility] for DUPIXENT via CoverMyMeds. Will update once we receive a response.  Key: [id number]  Chesley Mires, PharmD, MPH, BCPS, CPP Clinical Pharmacist (Rheumatology and Pulmonology)

## 2022-12-01 NOTE — Patient Instructions (Addendum)
-   we will get you started on dupixent - guaifenesin (mucinex) 600 mg twice daily when you have chest congestion - claritin 10 mg daily when you have postnasal drainage - breztri 2 puffs twice daily with spacer - THEN flutter valve 10 slow but firm puffs twice daily until chest congestion clears up - flonase 1 spray each nostril after clearing nose out following shower - see you in 3 months or sooner if need be!

## 2022-12-02 ENCOUNTER — Other Ambulatory Visit (HOSPITAL_COMMUNITY): Payer: Self-pay

## 2022-12-02 NOTE — Telephone Encounter (Signed)
Received notification from [employer] regarding a prior authorization for DUPIXENT. Authorization has been APPROVED from 12/01/22 to 06/08/23. Approval letter sent to scan center.  Per test claim, copay for 28 days supply is $2079.41  Patient can fill through [facility]: [phone]   Phone # [phone]  Submitted Patient Assistance Application to [employer] for DUPIXENT along with provider portion, pt portion, med list, insurance card copy, PA . Will update patient when we receive a response.  Phone #: [phone] Fax #: [phone]  Chesley Mires, PharmD, MPH, BCPS, CPP Clinical Pharmacist (Rheumatology and Pulmonology)

## 2022-12-05 ENCOUNTER — Other Ambulatory Visit: Payer: Self-pay | Admitting: Nurse Practitioner

## 2022-12-05 DIAGNOSIS — J40 Bronchitis, not specified as acute or chronic: Secondary | ICD-10-CM

## 2022-12-05 DIAGNOSIS — K1379 Other lesions of oral mucosa: Secondary | ICD-10-CM

## 2022-12-09 ENCOUNTER — Other Ambulatory Visit: Payer: Self-pay | Admitting: Nurse Practitioner

## 2022-12-21 NOTE — Telephone Encounter (Signed)
Called patient and informed him to call [phone] to let them know he cannot afford his medication.

## 2022-12-22 NOTE — Telephone Encounter (Signed)
Received a fax from  [employer] regarding an approval for DUPIXENT patient assistance from 12/21/22 to 06/08/23. Approval letter sent to scan center.  Phone #: [phone] Fax #: [phone]  Patient scheduled for Dupixent new start on 12/23/22. Will use sample.  Chesley Mires, PharmD, MPH, BCPS, CPP Clinical Pharmacist (Rheumatology and Pulmonology)

## 2022-12-23 ENCOUNTER — Ambulatory Visit: Payer: Medicare HMO | Admitting: Pharmacist

## 2022-12-23 DIAGNOSIS — Z7189 Other specified counseling: Secondary | ICD-10-CM

## 2022-12-23 DIAGNOSIS — J4489 Other specified chronic obstructive pulmonary disease: Secondary | ICD-10-CM

## 2022-12-23 MED ORDER — DUPIXENT 300 MG/2ML ~~LOC~~ SOAJ
300.0000 mg | SUBCUTANEOUS | 1 refills | Status: DC
Start: 2022-12-23 — End: 2023-04-27

## 2022-12-23 NOTE — Patient Instructions (Addendum)
Your next Dupixent dose is due on 7/31, 8/14, and every 14 days thereafter  CONTINUE Breztri inhaler (inhale 2 puffs into the lungs twice daily) and prednisone as prescribed  Your prescription will be shipped from [facility]. Their phone number is [phone] Please call to schedule shipment and confirm address. They will mail your medication to your home.  You will need to be seen by your provider in 3 to 4 months to assess how Dupixent is working for you. Please ensure you have a follow-up appointment scheduled in October or November. Call our clinic if you need to make this appointment.  How to manage an injection site reaction: Remember the 5 C's: COUNTER - leave on the counter at least 30 minutes but up to overnight to bring medication to room temperature. This may help prevent stinging COLD - place something cold (like an ice gel pack or cold water bottle) on the injection site just before cleansing with alcohol. This may help reduce pain CLARITIN - use Claritin (generic name is loratadine) for the first two weeks of treatment or the day of, the day before, and the day after injecting. This will help to minimize injection site reactions CORTISONE CREAM - apply if injection site is irritated and itching CALL ME - if injection site reaction is bigger than the size of your fist, looks infected, blisters, or if you develop hives

## 2022-12-23 NOTE — Progress Notes (Signed)
HPI Patient presents today to [facility] to see pharmacy team for Dupixent new start.  He is accompanied by friend, Synetta Fail "Bonita Quin". Past medical history includes eosinophilic asthma, COPD, frequent steriod use, CAD, diastolic dysfunction, smoking. Patient stated he has stopped using his nebulizer (stopped 6 days ago) and he feels better without using it. Patient has not been taking Breztri regularly.    Respiratory Medications Current regimen: Breztri, DuoNeb nebulization, prednisone 5mg  BID Tried in past: Breo Ellipta, Pulmicort nebulization, Incruse Ellipta, Tudorza Pressair Patient reports no known adherence challenges  OBJECTIVE No Known Allergies  Outpatient Encounter Medications as of 12/23/2022  Medication Sig Note   acetaminophen (TYLENOL) 500 MG tablet Take 1,500 mg by mouth every 6 (six) hours as needed for mild pain or moderate pain. Patient only uses when needed.    albuterol (VENTOLIN HFA) 108 (90 Base) MCG/ACT inhaler Inhale 2 puffs into the lungs every 4 (four) hours as needed for wheezing or shortness of breath. Make sure you rinse mouth out after each use.    atorvastatin (LIPITOR) 80 MG tablet TAKE 1 TABLET BY MOUTH EVERY DAY    Budeson-Glycopyrrol-Formoterol (BREZTRI AEROSPHERE) 160-9-4.8 MCG/ACT AERO Inhale 2 puffs into the lungs in the morning and at bedtime.    busPIRone (BUSPAR) 5 MG tablet TAKE 1/2 TO 1 TABLET 3 TIME A DAY FOR ANXIETY    clopidogrel (PLAVIX) 75 MG tablet Take 1 tablet (75 mg total) by mouth daily.    ezetimibe (ZETIA) 10 MG tablet Take 1 tablet (10 mg total) by mouth daily.    fexofenadine (ALLEGRA) 180 MG tablet Take 1 tablet by mouth daily.    fluticasone (FLONASE) 50 MCG/ACT nasal spray Place 1 spray into both nostrils daily.    IBUPROFEN PO Take by mouth. PRN    ipratropium-albuterol (DUONEB) 0.5-2.5 (3) MG/3ML SOLN Take 3 mLs by nebulization every 6 (six) hours as needed. Max:4 doses per day    loratadine (CLARITIN) 10 MG tablet TAKE  ONE PILL ONCE DAILY AS NEEDED FOR ITCHING (Patient taking differently: Take 10 mg by mouth daily.)    Multiple Vitamin (MULTIVITAMIN) tablet Take 1 tablet by mouth daily.    nitroGLYCERIN (NITROSTAT) 0.4 MG SL tablet Place 0.4 mg under the tongue every 5 (five) minutes as needed for chest pain. 10/21/2022: PRN   nystatin (MYCOSTATIN) 100000 UNIT/ML suspension Swish 5 mL in the mouth and retain for as long as possible (several minutes) before swallowing.    olmesartan-hydrochlorothiazide (BENICAR HCT) 20-12.5 MG tablet Take 1 tablet by mouth daily.    predniSONE (DELTASONE) 5 MG tablet Take 1 tablet (5 mg total) by mouth 2 (two) times daily with a meal.    promethazine-dextromethorphan (PROMETHAZINE-DM) 6.25-15 MG/5ML syrup Take 5 mLs by mouth 4 (four) times daily as needed for cough. 10/21/2022: Uses only as needed for cough.   Respiratory Therapy Supplies (NEBULIZER/TUBING/MOUTHPIECE) KIT Disp one nebulizer machine, tubing set and mouthpiece kit for use 4 times daily    Spacer/Aero-Holding Chambers DEVI 2 puffs by Does not apply route 2 (two) times daily. Please use with MDI inhalers    VITAMIN D PO Take 5,000 Units by mouth daily. Taking 10,000 units daily    No facility-administered encounter medications on file as of 12/23/2022.     Immunization History  Administered Date(s) Administered   Fluad Quad(high Dose 65+) 03/19/2022   Influenza Split 04/08/2012   Influenza, High Dose Seasonal PF 03/04/2017, 04/14/2021   Influenza,inj,Quad PF,6+ Mos 05/16/2015, 02/15/2018   Moderna Sars-Covid-2 Vaccination 07/15/2019,  08/12/2019   Pneumococcal Conjugate-13 07/25/2014   Pneumococcal Polysaccharide-23 04/08/2012   Pneumococcal-Unspecified 06/06/2002   Td 06/30/2011   Tdap 10/21/2019   Zoster, Live 05/22/2008     PFTs     No data to display           Eosinophils Most recent blood eosinophil count was 407 cells/microL taken on 11/26/22.   IgE: no records found   Assessment    Biologics training for dupilumab (Dupixent)  Goals of therapy: Mechanism: human monoclonal IgG4 antibody that inhibits interleukin-4 and interleukin-13 cytokine-induced responses, including release of proinflammatory cytokines, chemokines, and IgE Reviewed that Dupixent is add-on medication and patient must continue maintenance inhaler regimen. Response to therapy: may take 4 months to determine efficacy. Discussed that patients generally feel improvement sooner than 4 months.  Side effects: injection site reaction (6-18%), antibody development (5-16%), ophthalmic conjunctivitis (2-16%), transient blood eosinophilia (1-2%)  Dose: 600mg  at Week 0 (administered today in clinic) followed by 300mg  every 14 days thereafter  Administration/Storage:  Reviewed administration sites of thigh or abdomen (at least 2-3 inches away from abdomen). Reviewed the upper arm is only appropriate if caregiver is administering injection  Do not shake pen/syringe as this could lead to product foaming or precipitation. Do not use if solution is discolored or contains particulate matter or if window on prefilled pen is yellow (indicates pen has been used).  Reviewed storage of medication in refrigerator. Reviewed that Dupixent can be stored at room temperature in unopened carton for up to 14 days.  Access: Approval of Dupixent through: patient assistance  Patient self-administered Dupixent 300mg /45ml x 2 (total dose 600mg ) in right lower abdomen and left lower abdomen using sample Dupixent 300mg /64mL autoinjector pen NDC: 4742-5956-38 Lot: [id number] Expiration: 09-05-2024  Patient monitored for 30 minutes for adverse reaction.  Patient tolerated Dupixent. Injection site checked and no redness, itchiness or other reactions.  Medication Reconciliation  A drug regimen assessment was performed, including review of allergies, interactions, disease-state management, dosing and immunization history. Medications were  reviewed with the patient, including name, instructions, indication, goals of therapy, potential side effects, importance of adherence, and safe use.  Drug interaction(s): none   PLAN Continue Dupixent 300mg  every 14 days.  Next dose is due 01/06/23 and every 14 days thereafter. Rx sent to: [facility]: [phone].  Patient provided with pharmacy phone number and advised to call later this week to schedule shipment to home.  Continue maintenance asthma regimen of: Breztri 2 puffs twice daily, DuoNeb nebulization as needs, and prednisone as instructed by provider Patient verbalizes understanding that he now needs to take Breztri 2 puffs twice daily and that DuoNeb is an as needed medication  All questions encouraged and answered.  Instructed patient to reach out with any further questions or concerns.  Thank you for allowing pharmacy to participate in this patient's care.  This appointment required 60 minutes of patient care (this includes precharting, chart review, review of results, face-to-face care, etc.).  Rickey Primus, PharmD Candidate [employer] Class of [phone]

## 2023-01-11 ENCOUNTER — Other Ambulatory Visit: Payer: Self-pay | Admitting: Nurse Practitioner

## 2023-01-11 ENCOUNTER — Other Ambulatory Visit: Payer: Self-pay | Admitting: Internal Medicine

## 2023-01-11 DIAGNOSIS — E782 Mixed hyperlipidemia: Secondary | ICD-10-CM

## 2023-01-11 DIAGNOSIS — J449 Chronic obstructive pulmonary disease, unspecified: Secondary | ICD-10-CM

## 2023-01-21 NOTE — Progress Notes (Unsigned)
Chief Complaint: Dysphagia Primary GI MD: Dr. Leone Payor  HPI: 82 year old male history of hypertension, CAD, TIA (on Plavix), esophageal strictures s/p multiple EGDs with dilation, presents for evaluation of dysphagia.  History of TIA January 2024 and was put on DAPT for total of 3 months with plans to continue Plavix and thereafter.  Discontinued aspirin April 5 and remains on Plavix.  Recent history of hospital admission 10/21/2022 for COPD exacerbation after not responding to outpatient course of antibiotics and prednisone.  Patient improved on IV steroids and doxycycline  Recent echocardiogram with adequate ejection fraction  Patient states he has had ongoing dysphagia and GERD since 2 weeks after his last endoscopy.  States he has not been on PPI as recommended.  He is taking 6 times per day as needed.  States every time he eats food it gets stuck in his epigastrium and he occasionally has to vomit back up.  He also has severe GERD that wakes him up in the middle the night.  Denies lower GI symptoms  PREVIOUS GI WORKUP   EGD 07/26/2020 for dysphagia - Benign-appearing esophageal stenosis dilated to 20 mm - 2 cm hiatal hernia - Gastroesophageal flap valve classified as Hill grade 4 - Erythematous mucosa in stomach - No specimens collected - Recommended to stay on omeprazole or other PPI indefinitely  EGD 02/24/2018 for dysphagia - Normal larynx - Small hiatal hernia.  Distal esophageal tortuosity - Benign-appearing esophageal stenosis dilated to 18 mm - Gastric mucosal atrophy - Nodular mucosa in gastric fundus and gastric body. - Normal examined duodenum - Several biopsies obtained.  Biopsies showed chronic gastritis, H. pylori negative  07/16/2016 colonoscopy with one 5 mm polyp in the sigmoid colon, removed with a cold snare, diverticulosis in the sigmoid colon and otherwise normal exam.  Repeat was not recommended due to age.    07/26/2016 EGD showed benign-appearing  esophageal stenosis which was dilated to 20mm as well as a small hiatal hernia and exam was otherwise normal.   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   Coronary artery disease    Diverticulosis 07/16/2016   SIGMOID, NOTED ON COLONOSCOPY   Esophageal stenosis 02/24/2018   noted on endoscopy   Esophageal stricture    Former tobacco use    GERD (gastroesophageal reflux disease)    Grade I diastolic dysfunction 08/09/2017   noted on ECHO   History of hiatal hernia 02/24/2018   Small noted on endoscopy   Hydronephrosis    Hydronephrosis of right kidney 03/04/2018   URETERAL STONE 7 MM, NOTED ONJ CT ABD/PELVIS   Hyperlipidemia    Myocardial infarction [facility])    [id number]   Nephrolithiasis    s/p stent placement   Skin cancer    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 NOTED ON CT ABD/PELVIS   Vitamin D deficiency    Wears dentures    upper    Past Surgical History:  Procedure Laterality Date   CARDIAC CATHETERIZATION  1983   No intervention performed.    CATARACT EXTRACTION, BILATERAL     COLONOSCOPY     COLONOSCOPY W/ POLYPECTOMY  08/12/2010   Dr. Stan Head   COLONOSCOPY W/ POLYPECTOMY  07/16/2016  CYSTOSCOPY W/ URETERAL STENT PLACEMENT Right 03/04/2018   Procedure: CYSTOSCOPY WITH RETROGRADE PYELOGRAM/URETERAL STENT PLACEMENT;  Surgeon: Crista Elliot, MD;  Location: [facility] OR;  Service: Urology;  Laterality: Right;   CYSTOSCOPY/URETEROSCOPY/HOLMIUM LASER/STENT PLACEMENT Right 03/16/2018   Procedure: CYSTOSCOPY RIGHT URETEROSCOPY/HOLMIUM LASER/STENT PLACEMENT;  Surgeon: Crista Elliot, MD;  Location: [facility];  Service: Urology;  Laterality: Right;   ESOPHAGOGASTRODUODENOSCOPY     INGUINAL HERNIA REPAIR     left    right leg amputation  05/14/1977   SHOULDER ARTHROSCOPY Left 2000   UPPER GASTROINTESTINAL ENDOSCOPY  WITH DILATION  02/24/2018    Current Outpatient Medications  Medication Sig Dispense Refill   acetaminophen (TYLENOL) 500 MG tablet Take 1,500 mg by mouth every 6 (six) hours as needed for mild pain or moderate pain. Patient only uses when needed.     albuterol (VENTOLIN HFA) 108 (90 Base) MCG/ACT inhaler Inhale 2 puffs into the lungs every 4 (four) hours as needed for wheezing or shortness of breath. Make sure you rinse mouth out after each use. 18 g 2   atorvastatin (LIPITOR) 80 MG tablet TAKE 1 TABLET BY MOUTH EVERY DAY 90 tablet 1   Budeson-Glycopyrrol-Formoterol (BREZTRI AEROSPHERE) 160-9-4.8 MCG/ACT AERO Inhale 2 puffs into the lungs in the morning and at bedtime. 1 each 11   busPIRone (BUSPAR) 5 MG tablet TAKE 1/2 TO 1 TABLET 3 TIME A DAY FOR ANXIETY 270 tablet 0   clopidogrel (PLAVIX) 75 MG tablet Take 1 tablet (75 mg total) by mouth daily. 30 tablet 11   Dupilumab (DUPIXENT) 300 MG/2ML SOPN Inject 300 mg into the skin every 14 (fourteen) days. **loading dose received in clinic on 12/23/22 12 mL 1   ezetimibe (ZETIA) 10 MG tablet TAKE 1 TABLET BY MOUTH EVERY DAY FOR CHOLESTEROL 90 tablet 3   fexofenadine (ALLEGRA) 180 MG tablet Take 1 tablet by mouth daily.     fluticasone (FLONASE) 50 MCG/ACT nasal spray Place 1 spray into both nostrils daily.     IBUPROFEN PO Take by mouth. PRN     ipratropium-albuterol (DUONEB) 0.5-2.5 (3) MG/3ML SOLN Take 3 mLs by nebulization every 6 (six) hours as needed. Max:4 doses per day 1080 each 1   loratadine (CLARITIN) 10 MG tablet TAKE ONE PILL ONCE DAILY AS NEEDED FOR ITCHING (Patient taking differently: Take 10 mg by mouth daily.) 90 tablet 3   Multiple Vitamin (MULTIVITAMIN) tablet Take 1 tablet by mouth daily.     nitroGLYCERIN (NITROSTAT) 0.4 MG SL tablet Place 0.4 mg under the tongue every 5 (five) minutes as needed for chest pain.     nystatin  (MYCOSTATIN) 100000 UNIT/ML suspension Swish 5 mL in the mouth and retain for as long as possible (several minutes) before swallowing. 60 mL 0   olmesartan-hydrochlorothiazide (BENICAR HCT) 20-12.5 MG tablet Take 1 tablet by mouth daily.     predniSONE (DELTASONE) 5 MG tablet Take 1 tablet (5 mg total) by mouth 2 (two) times daily with a meal. 60 tablet 0   promethazine-dextromethorphan (PROMETHAZINE-DM) 6.25-15 MG/5ML syrup Take 5 mLs by mouth 4 (four) times daily as needed for cough. 240 mL 1   Respiratory Therapy Supplies (NEBULIZER/TUBING/MOUTHPIECE) KIT Disp one nebulizer machine, tubing set and mouthpiece kit for use 4 times daily 1 kit 0   Spacer/Aero-Holding Chambers DEVI 2 puffs by Does not apply route 2 (two) times daily. Please use with MDI inhalers 1 each 0   VITAMIN D PO Take 5,000 Units by mouth daily.  Taking 10,000 units daily     No current facility-administered medications for this visit.    Allergies as of 01/22/2023   (No Known Allergies)    Family History  Problem Relation Age of Onset   Pancreatic cancer Mother    Hypertension Father    Colon cancer Brother    Leukemia Brother    Prostate cancer Brother    Other Brother        polio   Esophageal cancer Neg Hx    Rectal cancer Neg Hx    Stomach cancer Neg Hx     Social History   Socioeconomic History   Marital status: Divorced    Spouse name: Not on file   Number of children: 4   Years of education: Not on file   Highest education level: 11th grade  Occupational History   Occupation: retired    Associate Professor: RETIRED  Tobacco Use   Smoking status: Former    Current packs/day: 0.00    Average packs/day: 3.0 packs/day for 20.0 years (60.0 ttl pk-yrs)    Types: Cigarettes    Start date: 04/11/1980    Quit date: 04/11/2000    Years since quitting: 22.7   Smokeless tobacco: Never  Vaping Use   Vaping status: Never Used  Substance and Sexual Activity   Alcohol use: Not Currently    Comment: rare   Drug  use: No   Sexual activity: Not Currently  Other Topics Concern   Not on file  Social History Narrative   [employer]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    Social Determinants of Health   Financial Resource Strain: Not on file  Food Insecurity: No Food Insecurity (10/21/2022)   Hunger Vital Sign    Worried About Running Out of Food in the Last Year: Never true    Ran Out of Food in the Last Year: Never true  Transportation Needs: No Transportation Needs (10/21/2022)   PRAPARE - Administrator, Civil Service (Medical): No    Lack of Transportation (Non-Medical): No  Physical Activity: Not on file  Stress: Not on file  Social Connections: Not on file  Intimate Partner Violence: Not At Risk (10/21/2022)   Humiliation, Afraid, Rape, and Kick questionnaire    Fear of Current or Ex-Partner: No    Emotionally Abused: No    Physically Abused: No    Sexually Abused: No    Review of Systems:    Constitutional: No weight loss, fever, chills, weakness or fatigue HEENT: Eyes: No change in vision               Ears, Nose, Throat:  No change in hearing or congestion Skin: No rash or itching Cardiovascular: No chest pain, chest pressure or palpitations   Respiratory: No SOB or cough Gastrointestinal: See HPI and otherwise negative Genitourinary: No dysuria or change in urinary frequency Neurological: No headache, dizziness or syncope Musculoskeletal: No new muscle or joint pain Hematologic: No bleeding or bruising Psychiatric: No history of depression or anxiety    Physical Exam:  Vital signs: There were no vitals taken for this visit.  Constitutional: NAD, Well developed, Well nourished, alert and cooperative Head:  Normocephalic and atraumatic. Eyes:   PEERL, EOMI. No icterus. Conjunctiva pink. Respiratory: Respirations even and unlabored. Lungs clear to  auscultation bilaterally.   No wheezes, crackles, or rhonchi.  Cardiovascular:  Regular rate and rhythm. No peripheral edema, cyanosis or pallor.  Gastrointestinal:  Soft, nondistended, nontender. No rebound or guarding. Normal bowel sounds. No appreciable masses or hepatomegaly. Rectal:  Not performed.  Msk: BKA amputee R Neurologic:  Alert and  oriented x4;  grossly normal neurologically.  Skin:   Dry and intact without significant lesions or rashes. Psychiatric: Oriented to person, place and time. Demonstrates good judgement and reason without abnormal affect or behaviors.   RELEVANT LABS AND IMAGING: CBC    Component Value Date/Time   WBC 11.0 (H) 11/26/2022 1019   RBC 3.85 (L) 11/26/2022 1019   HGB 12.4 (L) 11/26/2022 1019   HCT 37.3 (L) 11/26/2022 1019   PLT 358 11/26/2022 1019   MCV 96.9 11/26/2022 1019   MCH 32.2 11/26/2022 1019   MCHC 33.2 11/26/2022 1019   RDW 13.2 11/26/2022 1019   LYMPHSABS 891 11/26/2022 1019   MONOABS 1.3 (H) 10/21/2022 1015   EOSABS 407 11/26/2022 1019   BASOSABS 154 11/26/2022 1019    CMP     Component Value Date/Time   NA 138 11/26/2022 1019   K 4.6 11/26/2022 1019   CL 102 11/26/2022 1019   CO2 31 11/26/2022 1019   GLUCOSE 94 11/26/2022 1019   BUN 15 11/26/2022 1019   CREATININE 1.19 11/26/2022 1019   CALCIUM 9.1 11/26/2022 1019   PROT 5.9 (L) 11/26/2022 1019   ALBUMIN 3.2 (L) 10/21/2022 1015   AST 19 11/26/2022 1019   ALT 22 11/26/2022 1019   ALKPHOS 68 10/21/2022 1015   BILITOT 0.4 11/26/2022 1019   GFRNONAA 60 (L) 10/21/2022 1015   GFRNONAA 62 12/05/2020 1118   GFRAA 72 12/05/2020 1118     Assessment/Plan:   82 year old male history of hypertension, CAD, TIA (on Plavix), esophageal strictures s/p multiple EGDs with dilation, presents for evaluation of dysphagia.  Dysphagia GERD Continue to have dysphagia and GERD and not on PPI.  History of strictures. --- Extensive discussion with patient and family member about  importance of remaining on PPI indefinitely to prevent recurrence of symptoms and strictures. --- EGD with dilation for further evaluation --- I thoroughly discussed the procedure with the patient (at bedside) to include nature of the procedure, alternatives, benefits, and risks (including but not limited to bleeding, infection, perforation, anesthesia/cardiac pulmonary complications).  Patient verbalized understanding and gave verbal consent to proceed with procedure.  --- Hold plavix for 5 days per cardiology  Boone Master, PA-C [facility] 01/21/2023, 2:45 PM  Cc: Lucky Cowboy, MD

## 2023-01-22 ENCOUNTER — Encounter: Payer: Self-pay | Admitting: Gastroenterology

## 2023-01-22 ENCOUNTER — Ambulatory Visit: Payer: Medicare PPO | Admitting: Gastroenterology

## 2023-01-22 ENCOUNTER — Telehealth: Payer: Self-pay

## 2023-01-22 VITALS — BP 112/74 | HR 70 | Ht 68.0 in | Wt 139.0 lb

## 2023-01-22 DIAGNOSIS — R1310 Dysphagia, unspecified: Secondary | ICD-10-CM

## 2023-01-22 DIAGNOSIS — K219 Gastro-esophageal reflux disease without esophagitis: Secondary | ICD-10-CM

## 2023-01-22 MED ORDER — PANTOPRAZOLE SODIUM 40 MG PO TBEC: 40.0000 mg | DELAYED_RELEASE_TABLET | Freq: Every day | ORAL | 3 refills | Status: DC

## 2023-01-22 NOTE — Telephone Encounter (Signed)
Dr. Teresa Coombs,   Please see the request below for medical clearance below.     Request for surgical clearance:     Endoscopy Procedure  What type of surgery is being performed?     EGD with propofol  When is this surgery scheduled?     02-04-2023  What type of clearance is required ?   Pharmacy  Are there any medications that need to be held prior to surgery and how long? Yes, Plavix, 5 day hold request prior to scheduled EGD.  Practice name and name of physician performing surgery?      [facility]  What is your office phone and fax number?      Phone- [phone]  Fax- [phone]  Anesthesia type (None, local, MAC, general) ?       MAC

## 2023-01-22 NOTE — Patient Instructions (Addendum)
_______________________________________________________  If your blood pressure at your visit was 140/90 or greater, please contact your primary care physician to follow up on this.  _______________________________________________________  If you are age 83 or older, your body mass index should be between 23-30. Your Body mass index is 21.13 kg/m. If this is out of the aforementioned range listed, please consider follow up with your Primary Care Provider.  If you are age 17 or younger, your body mass index should be between 19-25. Your Body mass index is 21.13 kg/m. If this is out of the aformentioned range listed, please consider follow up with your Primary Care Provider.   ________________________________________________________  The [facility] providers would like to encourage you to use [facility] to communicate with providers for non-urgent requests or questions.  Due to long hold times on the telephone, sending your provider a message by [facility] may be a faster and more efficient way to get a response.  Please allow 48 business hours for a response.  Please remember that this is for non-urgent requests.  _______________________________________________________  Leslie Cardenas have been scheduled for an endoscopy. Please follow written instructions given to you at your visit today.  If you use inhalers (even only as needed), please bring them with you on the day of your procedure.  If you take any of the following medications, they will need to be adjusted prior to your procedure:   DO NOT TAKE 7 DAYS PRIOR TO TEST- Trulicity (dulaglutide) Ozempic, Wegovy (semaglutide) Mounjaro (tirzepatide) Bydureon Bcise (exanatide extended release)  DO NOT TAKE 1 DAY PRIOR TO YOUR TEST Rybelsus (semaglutide) Adlyxin (lixisenatide) Victoza (liraglutide) Byetta (exanatide) ___________________________________________________________________________    Due to recent changes in healthcare laws, you may see  the results of your imaging and laboratory studies on MyChart before your provider has had a chance to review them.  We understand that in some cases there may be results that are confusing or concerning to you. Not all laboratory results come back in the same time frame and the provider may be waiting for multiple results in order to interpret others.  Please give [place] 48 hours in order for your provider to thoroughly review all the results before contacting the office for clarification of your results.   .plav

## 2023-01-26 NOTE — Telephone Encounter (Signed)
Request for clearance has been faxed as URGENT

## 2023-01-27 ENCOUNTER — Encounter: Payer: Self-pay | Admitting: Neurology

## 2023-01-27 NOTE — Telephone Encounter (Signed)
See communications from Dr Teresa Coombs in the letters tab. Ok to hold Plavix for the 5 days we requested, but was cleared to hold up to 7 days. Pt notified by phone to hold for 7 days.

## 2023-01-29 ENCOUNTER — Telehealth: Payer: Self-pay | Admitting: Nurse Practitioner

## 2023-01-29 ENCOUNTER — Encounter: Payer: Medicare PPO | Admitting: Internal Medicine

## 2023-01-29 NOTE — Telephone Encounter (Signed)
He will require evaluation due to his history- recommend urgent carre

## 2023-01-29 NOTE — Telephone Encounter (Signed)
Pt's wife said the pt is feeling sick. He has a bad cough and is coughing up dark phlegm. She was wondering if you could send something in, but I told her that you probably won't since he needs to be seen. If so, it would sent to [facility] on file.

## 2023-01-29 NOTE — Telephone Encounter (Signed)
Spoke with patient and told him Leslie Cardenas said he needed to be evaluated and since we are only here 1/2 a day he should go to an urgent care to be treated.  Patient understands we can't just call in anything without seeing him first.

## 2023-02-04 ENCOUNTER — Ambulatory Visit (AMBULATORY_SURGERY_CENTER): Payer: Medicare PPO | Admitting: Internal Medicine

## 2023-02-04 ENCOUNTER — Encounter: Payer: Self-pay | Admitting: Internal Medicine

## 2023-02-04 VITALS — BP 112/88 | HR 86 | Temp 97.7°F | Resp 16 | Ht 68.0 in | Wt 139.0 lb

## 2023-02-04 DIAGNOSIS — K219 Gastro-esophageal reflux disease without esophagitis: Secondary | ICD-10-CM | POA: Diagnosis not present

## 2023-02-04 DIAGNOSIS — K222 Esophageal obstruction: Secondary | ICD-10-CM | POA: Diagnosis not present

## 2023-02-04 DIAGNOSIS — R1310 Dysphagia, unspecified: Secondary | ICD-10-CM

## 2023-02-04 MED ORDER — SODIUM CHLORIDE 0.9 % IV SOLN: 500.0000 mL | Freq: Once | INTRAVENOUS | Status: DC

## 2023-02-04 NOTE — Op Note (Signed)
[facility] Patient Name: Leslie Cardenas Procedure Date: 02/04/2023 10:13 AM MRN: [id number] Endoscopist: Iva Boop , MD, [id number] Age: 82 Referring MD:  Date of Birth: [birth year] Gender: Male Account #: [id number] Procedure:                Upper GI endoscopy Indications:              Dysphagia, Stricture of the esophagus, For therapy                            of esophageal stricture Medicines:                Monitored Anesthesia Care Procedure:                Pre-Anesthesia Assessment:                           - Prior to the procedure, a History and Physical                            was performed, and patient medications and                            allergies were reviewed. The patient's tolerance of                            previous anesthesia was also reviewed. The risks                            and benefits of the procedure and the sedation                            options and risks were discussed with the patient.                            All questions were answered, and informed consent                            was obtained. Prior Anticoagulants: The patient                            last took Plavix (clopidogrel) 5 days prior to the                            procedure. ASA Grade Assessment: III - A patient                            with severe systemic disease. After reviewing the                            risks and benefits, the patient was deemed in                            satisfactory condition to undergo the procedure.  After obtaining informed consent, the endoscope was                            passed under direct vision. Throughout the                            procedure, the patient's blood pressure, pulse, and                            oxygen saturations were monitored continuously. The                            Olympus Scope SN [id number] was introduced through the                            mouth, and  advanced to the second part of duodenum.                            The upper GI endoscopy was accomplished without                            difficulty. The patient tolerated the procedure                            well. Scope In: Scope Out: Findings:                 One benign-appearing, intrinsic moderate                            (circumferential scarring or stenosis; an endoscope                            may pass) stenosis was found at the                            gastroesophageal junction. The stenosis was                            traversed. A TTS dilator was passed through the                            scope. Dilation with an 18-19-20 mm balloon dilator                            was performed to 20 mm. The dilation site was                            examined and showed mild mucosal disruption.                            Estimated blood loss was minimal.                           A 3 cm hiatal hernia  was present.                           The gastroesophageal flap valve was visualized                            endoscopically and classified as Hill Grade IV (no                            fold, wide open lumen, hiatal hernia present).                           The mid esophagus and distal esophagus were mildly                            tortuous.                           The exam was otherwise without abnormality.                           The cardia and gastric fundus were otherwise normal                            on retroflexion. Complications:            No immediate complications. Estimated Blood Loss:     Estimated blood loss was minimal. Impression:               - Benign-appearing esophageal stenosis. Dilated.                           - 3 cm hiatal hernia.                           - Gastroesophageal flap valve classified as Hill                            Grade IV (no fold, wide open lumen, hiatal hernia                            present).                            - Tortuous esophagus. ? some dysmotility                           - The examination was otherwise normal.                           - No specimens collected. Recommendation:           - Patient has a contact number available for                            emergencies. The signs and symptoms of potential  delayed complications were discussed with the                            patient. Return to normal activities tomorrow.                            Written discharge instructions were provided to the                            patient.                           - Clear liquids x 1 hour then soft foods rest of                            day. Start prior diet tomorrow.                           - Continue present medications. Always take PPI                           - Resume Plavix (clopidogrel) at prior dose                            tomorrow.                           - Cut small chew well and have upper and lower                            dentures Iva Boop, MD 02/04/2023 10:41:53 AM This report has been signed electronically.

## 2023-02-04 NOTE — Progress Notes (Signed)
History and Physical Interval Note:  02/04/2023 10:19 AM  Leslie Cardenas  has presented today for endoscopic procedure(s), with the diagnosis of  Encounter Diagnoses  Name Primary?   Dysphagia, unspecified type Yes   Gastroesophageal reflux disease, unspecified whether esophagitis present   .  The various methods of evaluation and treatment have been discussed with the patient and/or family. After consideration of risks, benefits and other options for treatment, the patient has consented to  the endoscopic procedure(s).   The patient's history has been reviewed, patient examined, no change in status, stable for endoscopic procedure(s).  I have reviewed the patient's chart and labs.  Questions were answered to the patient's satisfaction.     Iva Boop, MD, Clementeen Graham

## 2023-02-04 NOTE — Progress Notes (Signed)
Sedate, gd SR, tolerated procedure well, VSS, report to RN 

## 2023-02-04 NOTE — Progress Notes (Signed)
History and Physical Interval Note:  02/04/2023 10:26 AM  Leslie Cardenas  has presented today for endoscopic procedure(s), with the diagnosis of  Encounter Diagnoses  Name Primary?   Dysphagia, unspecified type Yes   Gastroesophageal reflux disease, unspecified whether esophagitis present   .  The various methods of evaluation and treatment have been discussed with the patient and/or family. After consideration of risks, benefits and other options for treatment, the patient has consented to  the endoscopic procedure(s).   The patient's history has been reviewed, patient examined, no change in status, stable for endoscopic procedure(s).  I have reviewed the patient's chart and labs.  Questions were answered to the patient's satisfaction.     Iva Boop, MD, Clementeen Graham

## 2023-02-04 NOTE — Progress Notes (Signed)
Pt's states no medical or surgical changes since previsit or office visit. 

## 2023-02-04 NOTE — Patient Instructions (Addendum)
I dilated the stricture (narrow area) again.  Please do the following: 1) Always take pantoprazole - do not stop it - it helps prevent swallowing problems 2) Have a full set of dentures 3) Chew food well and cut into small pieces 4) restart clopidogrel (Plavix) tomorrow  I appreciate the opportunity to care for you. Iva Boop, MD, [facility]  Follow dilation diet!!  YOU HAD AN ENDOSCOPIC PROCEDURE TODAY AT THE [facility]:   Refer to the procedure report that was given to you for any specific questions about what was found during the examination.  If the procedure report does not answer your questions, please call your gastroenterologist to clarify.  If you requested that your care partner not be given the details of your procedure findings, then the procedure report has been included in a sealed envelope for you to review at your convenience later.  YOU SHOULD EXPECT: Some feelings of bloating in the abdomen. Passage of more gas than usual.  Walking can help get rid of the air that was put into your GI tract during the procedure and reduce the bloating. I Please Note:  You might notice some irritation and congestion in your nose or some drainage.  This is from the oxygen used during your procedure.  There is no need for concern and it should clear up in a day or so.  SYMPTOMS TO REPORT IMMEDIATELY:   Following upper endoscopy (EGD)  Vomiting of blood or coffee ground material  New chest pain or pain under the shoulder blades  Painful or persistently difficult swallowing  New shortness of breath  Fever of 100F or higher  Black, tarry-looking stools  For urgent or emergent issues, a gastroenterologist can be reached at any hour by calling [phone]. Do not use MyChart messaging for urgent concerns.    DIET:  FOLLOW DILATION DIET!!!   Drink plenty of fluids but you should avoid alcoholic beverages for 24 hours.  ACTIVITY:  You should plan to take it easy for the  rest of today and you should NOT DRIVE or use heavy machinery until tomorrow (because of the sedation medicines used during the test).    FOLLOW UP: Our staff will call the number listed on your records the next business day following your procedure.  We will call around 7:15- 8:00 am to check on you and address any questions or concerns that you may have regarding the information given to you following your procedure. If we do not reach you, we will leave a message.     If any biopsies were taken you will be contacted by phone or by letter within the next 1-3 weeks.  Please call us at [phone] if you have not heard about the biopsies in 3 weeks.    SIGNATURES/CONFIDENTIALITY: You and/or your care partner have signed paperwork which will be entered into your electronic medical record.  These signatures attest to the fact that that the information above on your After Visit Summary has been reviewed and is understood.  Full responsibility of the confidentiality of this discharge information lies with you and/or your care-partner.

## 2023-02-04 NOTE — Progress Notes (Signed)
Called to room to assist during endoscopic procedure.  Patient ID and intended procedure confirmed with present staff. Received instructions for my participation in the procedure from the performing physician.  

## 2023-02-05 ENCOUNTER — Telehealth: Payer: Self-pay

## 2023-02-05 NOTE — Telephone Encounter (Signed)
Post procedure follow up call, no answer 

## 2023-02-17 ENCOUNTER — Encounter: Payer: Self-pay | Admitting: Internal Medicine

## 2023-02-17 ENCOUNTER — Ambulatory Visit: Payer: Medicare PPO | Admitting: Internal Medicine

## 2023-02-17 VITALS — BP 150/80 | HR 65 | Ht 70.0 in | Wt 138.4 lb

## 2023-02-17 DIAGNOSIS — Z23 Encounter for immunization: Secondary | ICD-10-CM

## 2023-02-17 DIAGNOSIS — J4489 Other specified chronic obstructive pulmonary disease: Secondary | ICD-10-CM | POA: Diagnosis not present

## 2023-02-17 DIAGNOSIS — J309 Allergic rhinitis, unspecified: Secondary | ICD-10-CM

## 2023-02-17 DIAGNOSIS — D7219 Other eosinophilia: Secondary | ICD-10-CM

## 2023-02-17 MED ORDER — AZELASTINE HCL 0.1 % NA SOLN: 1.0000 | Freq: Two times a day (BID) | NASAL | 12 refills | Status: DC

## 2023-02-17 NOTE — Addendum Note (Signed)
Addended by: Carnella Guadalajara on: 02/17/2023 10:00 AM   Modules accepted: Orders

## 2023-02-17 NOTE — Patient Instructions (Addendum)
It was a pleasure to see you today!  3Please schedule follow up scheduled with myself in 3 months.  If my schedule is not open yet, we will contact you with a reminder closer to that time. Please call [phone] if you haven't heard from [place] a month before, and always call us sooner if issues or concerns arise. You can also send [place] a message through MyChart, but but aware that this is not to be used for urgent issues and it may take up to 5-7 days to receive a reply. Please be aware that you will likely be able to view your results before I have a chance to respond to them. Please give [place] 5 business days to respond to any non-urgent results.  It was a pleasure to see you today!  Before your next visit I would like you to have: Blood work today for allergies High dose flu shot today. Please get your covid and RSV vaccines at the pharmacy.    Flonase and astelin - 1 spray on each side of your nose twice a day for first week, then 1 spray on each side.   Instructions for use: If you also use a saline nasal spray or rinse, use that first. Position the head with the chin slightly tucked. Use the right hand to spray into the left nostril and the right hand to spray into the left nostril.   Point the bottle away from the septum of your nose (cartilage that divides the two sides of your nose).  Hold the nostril closed on the opposite side from where you will spray Spray once and gently sniff to pull the medicine into the higher parts of your nose.  Don't sniff too hard as the medicine will drain down the back of your throat instead. Repeat with a second spray on the same side if prescribed. Repeat on the other side of your nose.  Continue the dupixent injections Continue breztri 2 puffs twice daily, take albuterol in between if you need something rather than extra breztri. Use flutter valve after inhaler treatments to help cough up mucus.   Go down to 2.5 once daily on the prednisone

## 2023-02-17 NOTE — Progress Notes (Signed)
Leslie Cardenas    [id number]    [birth year]  Primary Care Physician:McKeown, Chrissie Noa, MD Date of Appointment: 02/17/2023 Established Patient Visit  Chief complaint:   Chief Complaint  Patient presents with   Follow-up    Pt is here for F/U visit.      HPI: Leslie Cardenas is a 82 y.o. man with COPD former tobacco use quit 2001, CAD, esophageal strictures.  Interval Updates: Former Dr. Thora Lance patient. Here for follow up. Last seen June 2024. 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 Since starting this, he has improved stamina with exertion. Overall there is improvement in his breathing.  Taking breztri inhaler 2 puffs twice daily. Rescue inhaler about once a day. Rarely uses breathing treatment because feels that it makes him cough more.   Has been on prednisone 5 mg daily for many years for skin issues "itching." Interested in coming down on this.   Social history 60 pack years, quit 2001.  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  Lives at home with his wife - [place] in [place].  No childhood respiratory disease.   I have reviewed the patient's family social and past medical history and updated as appropriate.   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   Coronary artery disease    Diverticulosis 07/16/2016   SIGMOID, NOTED ON COLONOSCOPY   Esophageal stenosis 02/24/2018   noted on endoscopy   Esophageal stricture    Former tobacco use    GERD (gastroesophageal reflux disease)    Grade I diastolic dysfunction 08/09/2017   noted on ECHO   History of hiatal hernia 02/24/2018   Small noted on endoscopy   Hydronephrosis    Hydronephrosis of right kidney 03/04/2018   URETERAL STONE 7 MM, NOTED  ONJ CT ABD/PELVIS   Hyperlipidemia    Myocardial infarction [facility])    [id number]   Nephrolithiasis    s/p stent placement   Skin cancer    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 NOTED ON CT ABD/PELVIS   Vitamin D deficiency    Wears dentures    upper    Past Surgical History:  Procedure Laterality Date   CARDIAC CATHETERIZATION  1983   No intervention performed.    CATARACT EXTRACTION, BILATERAL     COLONOSCOPY     COLONOSCOPY W/ POLYPECTOMY  08/12/2010   Dr. Stan Head   COLONOSCOPY W/ POLYPECTOMY  07/16/2016   CYSTOSCOPY W/ URETERAL STENT PLACEMENT Right 03/04/2018   Procedure: CYSTOSCOPY WITH RETROGRADE PYELOGRAM/URETERAL STENT PLACEMENT;  Surgeon: Crista Elliot, MD;  Location: [facility] OR;  Service: Urology;  Laterality: Right;   CYSTOSCOPY/URETEROSCOPY/HOLMIUM LASER/STENT PLACEMENT Right 03/16/2018   Procedure: CYSTOSCOPY RIGHT URETEROSCOPY/HOLMIUM LASER/STENT PLACEMENT;  Surgeon: Crista Elliot, MD;  Location: [facility];  Service: Urology;  Laterality: Right;   ESOPHAGOGASTRODUODENOSCOPY     INGUINAL HERNIA REPAIR     left   right leg amputation  05/14/1977   SHOULDER ARTHROSCOPY Left 2000   UPPER GASTROINTESTINAL ENDOSCOPY  WITH DILATION  02/24/2018    Family History  Problem Relation  Age of Onset   Pancreatic cancer Mother    Hypertension Father    Colon cancer Brother    Leukemia Brother    Prostate cancer Brother    Other Brother        polio   Esophageal cancer Neg Hx    Rectal cancer Neg Hx    Stomach cancer Neg Hx     Social History   Occupational History   Occupation: retired    Associate Professor: RETIRED  Tobacco Use   Smoking status: Former    Current packs/day: 0.00    Average packs/day: 3.0 packs/day for 20.0 years (60.0 ttl pk-yrs)    Types: Cigarettes    Start date: 04/11/1980    Quit date: 04/11/2000    Years since quitting: 22.8   Smokeless tobacco: Never  Vaping Use   Vaping  status: Never Used  Substance and Sexual Activity   Alcohol use: Not Currently    Comment: rare   Drug use: No   Sexual activity: Not Currently     Physical Exam: Blood pressure (!) 150/80, pulse 65, height 5\' 10"  (1.778 m), weight 138 lb 6.4 oz (62.8 kg), SpO2 95%.  Gen:      No acute distress ENT:  no nasal polyps, mucus membranes moist, no nasal polyps Lungs:   decreased air entry, mild wheezing, no respiratory distress CV:         Regular rate and rhythm; no murmurs, rubs, or gallops.  No pedal edema   Data Reviewed: Imaging: I have personally reviewed the ct angio may 2024 shows moderate centrilobular emphysema  PFTs:      No data to display         I have personally reviewed the patient's PFTs and sever ariflow limitation + BD response.  ONO Jan 2023 - required oxygen   Labs:  Lab Results  Component Value Date   WBC 11.0 (H) 11/26/2022   HGB 12.4 (L) 11/26/2022   HCT 37.3 (L) 11/26/2022   MCV 96.9 11/26/2022   PLT 358 11/26/2022   Lab Results  Component Value Date   NA 138 11/26/2022   K 4.6 11/26/2022   CO2 31 11/26/2022   GLUCOSE 94 11/26/2022   BUN 15 11/26/2022   CREATININE 1.19 11/26/2022   CALCIUM 9.1 11/26/2022   EGFR 61 11/26/2022   GFRNONAA 60 (L) 10/21/2022      Immunization status: Immunization History  Administered Date(s) Administered   Fluad Quad(high Dose 65+) 03/19/2022   Influenza Split 04/08/2012   Influenza, High Dose Seasonal PF 03/04/2017, 04/14/2021   Influenza,inj,Quad PF,6+ Mos 05/16/2015, 02/15/2018   Moderna Sars-Covid-2 Vaccination 07/15/2019, 08/12/2019   Pneumococcal Conjugate-13 07/25/2014   Pneumococcal Polysaccharide-23 04/08/2012   Pneumococcal-Unspecified 06/06/2002   Td 06/30/2011   Tdap 10/21/2019   Zoster, Live 05/22/2008    External Records Personally Reviewed: pulmonary  Assessment:  Eosinophilc asthma copd overlap syndrome GERD Chronic Allergic Rhinitis Chronic glucocorticoid  use  Plan/Recommendations:  Blood work today for allergies  For rhinitis continue flonase, add astelin nasal spray  Continue the dupixent injections Continue breztri 2 puffs twice daily, take albuterol in between if you need something rather than extra breztri. Use flutter valve after inhaler treatments to help cough up mucus.   Go down to 2.5 once daily on the prednisone until we meet again  High dose flu shot today. Please get your covid and RSV vaccines at the pharmacy.   Return to Care: Return in about 3 months (around 05/19/2023).  Durel Salts, MD Pulmonary and Critical Care Medicine [facility] Office:[phone]

## 2023-02-18 LAB — RESPIRATORY ALLERGY PROFILE REGION II ~~LOC~~

## 2023-02-18 LAB — INTERPRETATION:

## 2023-02-28 ENCOUNTER — Encounter: Payer: Self-pay | Admitting: Internal Medicine

## 2023-02-28 NOTE — Progress Notes (Unsigned)
Future Appointments  Date Time Provider Department  03/01/2023               6 mo ov  9:30 AM Lucky Cowboy, MD [facility]  05/12/2023  3:30 PM Charlott Holler, MD [facility]  05/31/2023             9 mo ov  10:30 AM Raynelle Dick, NP [facility]  08/12/2023  3:45 PM Windell Norfolk, MD [facility]  08/30/2023                cpe 10:00 AM Lucky Cowboy, MD [facility]  11/26/2023               wellness  9:30 AM Raynelle Dick, NP [facility]    History of Present Illness:      This very nice 82 y.o. DWM  with HTN, HLD, COPD, Pre-Diabetes and Vitamin D Deficiency presents for  6 month follow up. 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  CXR in Feb 2020 showed Thoracic Aortic atherosclerosis. 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  He is on daily maintenance of nebulized bronchodilator.        Patient is treated for HTN circa 1980 & BPs  have been controlled at home. Today's BP was initially elevated & rechecked at goal - 136/76 .  Patient has hx/o a MI at age 52 in 34.  In 2013, he had a negative Lexiscan. 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   Patient has had no complaints of any cardiac type chest pain, palpitations, dyspnea / orthopnea / PND, dizziness, claudication, or dependent edema.        Hyperlipidemia is controlled with diet & Rosuvastatin /Ezetimibe . Patient denies myalgias or other med SE's. Last Lipids were at goal :  Lab Results  Component Value Date   CHOL 110 11/26/2022   HDL 45 11/26/2022   LDLCALC 42 11/26/2022   TRIG 156 (H) 11/26/2022   CHOLHDL 2.4 11/26/2022     Also, the patient has history of PreDiabetes (A1c 5.9% /2011 & 6.4% /2016) and has had no symptoms of reactive hypoglycemia, diabetic polys, paresthesias or visual blurring.  Last A1c was not at goal:  Lab Results  Component Value Date   HGBA1C 6.1 (H) 07/29/2022                                                       Further, the patient also has  history of Vitamin D Deficiency ("24" /2008) and supplements vitamin D without any suspected side-effects. Last vitamin D was still low:  Lab Results  Component Value Date   VD25OH 37 07/29/2022       Current Outpatient Medications on File Prior to Visit  Medication Sig   albuterol (2.5 MG/3ML)  neb soln Inhale 1 vial by Nebulizer 4 x /day or every 4 hr to rescue Asthma   albuterol HFA  inhaler Inhale 2 puffs every 4 hours as needed   calcium carbonate  600 MG TABS  Take 1 tablet  2 times daily with a meal.   Ensure  Take 237 mLs  Drinks one with his meals   ezetimibe  10 MG tablet Take 1 tablet daily.   lidocaine 2 % solution Use  as directed 15 mLsas needed for mouth pain.   loratadine 10 MG tablet TAKE 1 TABLET  DAILY   mirtazapine 15 MG tablet Take one tablet at bedtime   NITROSTAT 0.4 MG SL tablet as needed for chest pain.   olmesartan-HCT 20-12.5 MG tab TAKE 1 TABLET DAILY    omeprazole  40 MG capsule TAKE 1 CAPSULE DAILY    oxybutynin XL  10 MG 24 hr  TAKE 1 TABLET DAILY    predniSONE 5 MG tablet TAKE 1 TABLET  TWICE A DAY    rosuvastatin  20 MG tablet TAKE 1 TABLET EVERY DAY    tadalafil   20 MG tablet Take 1/2 to 1 tablet every 2 to 3 days if needed for XXXX   tiZANidine  4 MG tablet TAKE 1 TABLET 3 X /DAY FOR BACK PAIN & MUSCLE SPASMS   VITAMIN D 5,000 Units  Take  daily.   No Known Allergies  PMHx:   Past Medical History:  Diagnosis Date   Aortic atherosclerosis (HCC) 03/04/2018   Noted on CT Abd/pelvis   Cataract    Bilateral   Colon polyp    COPD (chronic obstructive pulmonary disease) (HCC)    Coronary artery disease    Diverticulosis 07/16/2016   SIGMOID, NOTED ON COLONOSCOPY   Esophageal stenosis 02/24/2018   noted on endoscopy   Esophageal stricture    GERD (gastroesophageal reflux disease)    Grade I diastolic dysfunction 08/09/2017   noted on ECHO   History of hiatal hernia 02/24/2018   Small noted on endoscopy   Hydronephrosis    Hydronephrosis of  right kidney 03/04/2018   URETERAL STONE 7 MM, NOTED ONJ CT ABD/PELVIS   Hyperlipidemia    Myocardial infarction [facility])    [id number]   Nephrolithiasis    s/p stent placement   Skin cancer    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 NOTED ON CT ABD/PELVIS   Vitamin D deficiency    Wears dentures    upper    Immunization History  Administered Date(s) Administered   Influenza Split 04/08/2012   Influenza, High Dose  03/04/2017   Influenza,inj,Quad PF 05/16/2015, 02/15/2018   Moderna Sars-Covid-2 Vacc 07/15/2019, 08/12/2019   Pneumococcal -13 07/25/2014   Pneumococcal -23 04/08/2012   Pneumococcal-23 06/06/2002   Td 06/30/2011   Tdap 10/21/2019   Zoster, Live 05/22/2008     Past Surgical History:  Procedure Laterality Date   CARDIAC CATHETERIZATION  1983   No intervention performed.    CATARACT EXTRACTION, BILATERAL     COLONOSCOPY     COLONOSCOPY W/ POLYPECTOMY  08/12/2010   Dr. Stan Head   COLONOSCOPY W/ POLYPECTOMY  07/16/2016   CYSTOSCOPY W/ URETERAL STENT PLACEMENT Right 03/04/2018   Procedure: CYSTOSCOPY WITH RETROGRADE PYELOGRAM/URETERAL STENT PLACEMENT;  Surgeon: Crista Elliot, MD;  Location: [facility] OR;  Service: Urology;  Laterality: Right;   CYSTOSCOPY/URETEROSCOPY/HOLMIUM LASER/STENT PLACEMENT Right 03/16/2018   Procedure: CYSTOSCOPY RIGHT URETEROSCOPY/HOLMIUM LASER/STENT PLACEMENT;  Surgeon: Crista Elliot, MD;  [facility];  Service: Urology;  Laterality: Right;   ESOPHAGOGASTRODUODENOSCOPY     INGUINAL HERNIA REPAIR     left   right leg amputation  05/14/1977   SHOULDER ARTHROSCOPY Left 2000   UPPER GASTROINTESTINAL ENDOSCOPY  WITH DILATION  02/24/2018    FHx:    Reviewed / unchanged  SHx:    Reviewed / unchanged   Systems Review:  Constitutional: Denies fever, chills, wt changes, headaches, insomnia, fatigue, night sweats, change in appetite. Eyes: Denies redness, blurred  vision, diplopia, discharge, itchy, watery eyes.  ENT: Denies discharge, congestion, post nasal drip, epistaxis, sore throat, earache, hearing loss, dental pain, tinnitus, vertigo, sinus pain, snoring.  CV: Denies chest pain, palpitations, irregular heartbeat, syncope, dyspnea, diaphoresis, orthopnea, PND, claudication or edema. Respiratory: denies cough, dyspnea, DOE, pleurisy, hoarseness, laryngitis, wheezing.  Gastrointestinal: Denies dysphagia, odynophagia, heartburn, reflux, water brash, abdominal pain or cramps, nausea, vomiting, bloating, diarrhea, constipation, hematemesis, melena, hematochezia  or hemorrhoids. Genitourinary: Denies dysuria, frequency, urgency, nocturia, hesitancy, discharge, hematuria or flank pain. Musculoskeletal: Denies arthralgias, myalgias, stiffness, jt. swelling, pain, limping or strain/sprain.  Skin: Denies pruritus, rash, hives, warts, acne, eczema or change in skin lesion(s). Neuro: No weakness, tremor, incoordination, spasms, paresthesia or pain. Psychiatric: Denies confusion, memory loss or sensory loss. Endo: Denies change in weight, skin or hair change.  Heme/Lymph: No excessive bleeding, bruising or enlarged lymph nodes.  Physical Exam  BP 136/76   Pulse 78   Temp 97.9 F (36.6 C)   Resp 16   Ht 5\' 10"  (1.778 m)   Wt 139 lb 9.6 oz (63.3 kg)   SpO2 95%   BMI 20.03 kg/m   Appears  well nourished, well groomed  and in no distress.  Eyes: PERRLA, EOMs, conjunctiva no swelling or erythema. Sinuses: No frontal/maxillary tenderness ENT/Mouth: EAC's clear, TM's nl w/o erythema, bulging. Nares clear w/o erythema, swelling, exudates. Oropharynx clear without erythema or exudates. Oral hygiene is good. Tongue normal, non obstructing. Hearing intact.  Neck: Supple. Thyroid not palpable. Car 2+/2+ without bruits, nodes or JVD. Chest: Respirations nl with BS clear & equal w/o rales, rhonchi, wheezing or stridor.  Cor: Heart sounds normal w/ regular rate and  rhythm without sig. murmurs, gallops, clicks or rubs. Peripheral pulses normal and equal  without edema.  Abdomen: Soft & bowel sounds normal. Non-tender w/o guarding, rebound, hernias, masses or organomegaly.  Lymphatics: Unremarkable.  Musculoskeletal:   Rt BKA w/ prosthesis.  Sl limping gait.  Skin: Warm, dry without exposed rashes, lesions or ecchymosis apparent.  Neuro: Cranial nerves intact, reflexes equal bilaterally. Sensory-motor testing grossly intact. Tendon reflexes grossly intact.  Pysch: Alert & oriented x 3.  Insight and judgement nl & appropriate. No ideations.   Assessment and Plan:   1. Essential hypertension  - Continue medication, monitor blood pressure at home.  - Continue DASH diet.  Reminder to go to the ER if any CP,  SOB, nausea, dizziness, severe HA, changes vision/speech.   - CBC with Differential/Platelet - COMPLETE METABOLIC PANEL WITH GFR - Magnesium - TSH   2. Hyperlipidemia, mixed  - Continue diet/meds, exercise,& lifestyle modifications.  - Continue monitor periodic cholesterol/liver & renal functions    - Lipid panel - TSH   3. Abnormal glucose  - Continue diet, exercise  - Lifestyle modifications.  - Monitor appropriate labs   - Hemoglobin A1c - Insulin, random   4. Statin myopathy  - Lipid panel  5. Vitamin D deficiency  - Continue supplementation.   - VITAMIN D 25 Hydroxy    6. Stage  2  chronic kidney disease (HCC)  - COMPLETE METABOLIC PANEL WITH GFR   7. S/P unilateral BKA (below knee amputation), right (HCC)   8. Thoracic aortic atherosclerosis (HCC) by CXR on 07/29/2018  - Lipid panel   9. Medication management  - CBC with Differential/Platelet - COMPLETE METABOLIC PANEL WITH GFR - Magnesium - Lipid  panel - TSH - Hemoglobin A1c - Insulin, random - VITAMIN D 25 Hydroxy           Discussed  regular exercise, BP monitoring, weight control to achieve/maintain BMI less than 25 and discussed med and  SE's. Recommended labs to assess and monitor clinical status with further disposition pending results of labs.   Also treated AECB with Doxycycline & Decadron pulse Gala Lewandowsky. I discussed the assessment and treatment plan with the patient. The patient was provided an opportunity to ask questions and all were answered. The patient agreed with the plan and demonstrated an understanding of the instructions.  I provided over 30 minutes of exam, counseling, chart review and  complex critical decision making.        The patient was advised to call back or seek an in-person evaluation if the symptoms worsen or if the condition fails to improve as anticipated.   Marinus Maw, MD.

## 2023-02-28 NOTE — Patient Instructions (Signed)

## 2023-03-01 ENCOUNTER — Encounter: Payer: Self-pay | Admitting: Internal Medicine

## 2023-03-01 ENCOUNTER — Ambulatory Visit (INDEPENDENT_AMBULATORY_CARE_PROVIDER_SITE_OTHER): Payer: Medicare PPO | Admitting: Internal Medicine

## 2023-03-01 VITALS — BP 136/76 | HR 78 | Temp 97.9°F | Resp 16 | Ht 70.0 in | Wt 139.6 lb

## 2023-03-01 DIAGNOSIS — Z89511 Acquired absence of right leg below knee: Secondary | ICD-10-CM | POA: Diagnosis not present

## 2023-03-01 DIAGNOSIS — T466X5A Adverse effect of antihyperlipidemic and antiarteriosclerotic drugs, initial encounter: Secondary | ICD-10-CM

## 2023-03-01 DIAGNOSIS — N1831 Chronic kidney disease, stage 3a: Secondary | ICD-10-CM | POA: Diagnosis not present

## 2023-03-01 DIAGNOSIS — G720 Drug-induced myopathy: Secondary | ICD-10-CM

## 2023-03-01 DIAGNOSIS — R7309 Other abnormal glucose: Secondary | ICD-10-CM

## 2023-03-01 DIAGNOSIS — I700 Atherosclerosis of aorta: Secondary | ICD-10-CM | POA: Diagnosis not present

## 2023-03-01 DIAGNOSIS — N182 Chronic kidney disease, stage 2 (mild): Secondary | ICD-10-CM | POA: Diagnosis not present

## 2023-03-01 DIAGNOSIS — E559 Vitamin D deficiency, unspecified: Secondary | ICD-10-CM

## 2023-03-01 DIAGNOSIS — I10 Essential (primary) hypertension: Secondary | ICD-10-CM | POA: Diagnosis not present

## 2023-03-01 DIAGNOSIS — E782 Mixed hyperlipidemia: Secondary | ICD-10-CM

## 2023-03-01 DIAGNOSIS — Z79899 Other long term (current) drug therapy: Secondary | ICD-10-CM | POA: Diagnosis not present

## 2023-03-01 MED ORDER — HYDROCORTISONE-ACETIC ACID 1-2 % OT SOLN: OTIC | 1 refills | Status: DC

## 2023-03-02 LAB — CBC WITH DIFFERENTIAL/PLATELET
Absolute Monocytes: 851 cells/uL (ref 200–950)
Basophils Absolute: 122 cells/uL (ref 0–200)
Basophils Relative: 1.5 %
Eosinophils Absolute: 308 cells/uL (ref 15–500)
Eosinophils Relative: 3.8 %
HCT: 39.4 % (ref 38.5–50.0)
Hemoglobin: 12.7 g/dL — ABNORMAL LOW (ref 13.2–17.1)
Lymphs Abs: 972 cells/uL (ref 850–3900)
MCH: 32.1 pg (ref 27.0–33.0)
MCHC: 32.2 g/dL (ref 32.0–36.0)
MCV: 99.5 fL (ref 80.0–100.0)
MPV: 9.3 fL (ref 7.5–12.5)
Monocytes Relative: 10.5 %
Neutro Abs: 5848 cells/uL (ref 1500–7800)
Neutrophils Relative %: 72.2 %
Platelets: 327 10*3/uL (ref 140–400)
RBC: 3.96 10*6/uL — ABNORMAL LOW (ref 4.20–5.80)
RDW: 12.3 % (ref 11.0–15.0)
Total Lymphocyte: 12 %
WBC: 8.1 10*3/uL (ref 3.8–10.8)

## 2023-03-02 LAB — COMPLETE METABOLIC PANEL WITH GFR
AG Ratio: 1.6 (calc) (ref 1.0–2.5)
ALT: 17 U/L (ref 9–46)
AST: 19 U/L (ref 10–35)
Albumin: 3.9 g/dL (ref 3.6–5.1)
Alkaline phosphatase (APISO): 68 U/L (ref 35–144)
BUN/Creatinine Ratio: 15 (calc) (ref 6–22)
BUN: 20 mg/dL (ref 7–25)
CO2: 30 mmol/L (ref 20–32)
Calcium: 9.7 mg/dL (ref 8.6–10.3)
Chloride: 103 mmol/L (ref 98–110)
Creat: 1.32 mg/dL — ABNORMAL HIGH (ref 0.70–1.22)
Globulin: 2.4 g/dL (calc) (ref 1.9–3.7)
Glucose, Bld: 78 mg/dL (ref 65–99)
Potassium: 4.5 mmol/L (ref 3.5–5.3)
Sodium: 139 mmol/L (ref 135–146)
Total Bilirubin: 0.4 mg/dL (ref 0.2–1.2)
Total Protein: 6.3 g/dL (ref 6.1–8.1)
eGFR: 54 mL/min/{1.73_m2} — ABNORMAL LOW (ref >=60)

## 2023-03-02 LAB — LIPID PANEL
Cholesterol: 107 mg/dL (ref <200)
HDL: 52 mg/dL (ref >=40)
LDL Cholesterol (Calc): 35 mg/dL (calc)
Non-HDL Cholesterol (Calc): 55 mg/dL (calc) (ref <130)
Total CHOL/HDL Ratio: 2.1 (calc) (ref <5.0)
Triglycerides: 113 mg/dL (ref <150)

## 2023-03-02 LAB — INSULIN, RANDOM: Insulin: 6.2 u[IU]/mL

## 2023-03-02 LAB — TSH: TSH: 1.55 mIU/L (ref 0.40–4.50)

## 2023-03-02 LAB — VITAMIN D 25 HYDROXY (VIT D DEFICIENCY, FRACTURES): Vit D, 25-Hydroxy: 99 ng/mL (ref 30–100)

## 2023-03-02 LAB — HEMOGLOBIN A1C
Hgb A1c MFr Bld: 6.3 % of total Hgb — ABNORMAL HIGH (ref <5.7)
Mean Plasma Glucose: 134 mg/dL
eAG (mmol/L): 7.4 mmol/L

## 2023-03-02 LAB — MAGNESIUM: Magnesium: 1.8 mg/dL (ref 1.5–2.5)

## 2023-03-02 NOTE — Progress Notes (Signed)
<>*<>*<>*<>*<>*<>*<>*<>*<>*<>*<>*<>*<>*<>*<>*<>*<>*<>*<>*<>*<>*<>*<>*<>*<> <>*<>*<>*<>*<>*<>*<>*<>*<>*<>*<>*<>*<>*<>*<>*<>*<>*<>*<>*<>*<>*<>*<>*<>*<>  -  Test results slightly outside the reference range are not unusual. If there is anything important, I will review this with you,  otherwise it is considered normal test values.  If you have further questions,  please do not hesitate to contact me at the office or via My Chart.   <>*<>*<>*<>*<>*<>*<>*<>*<>*<>*<>*<>*<>*<>*<>*<>*<>*<>*<>*<>*<>*<>*<>*<>*<> <>*<>*<>*<>*<>*<>*<>*<>*<>*<>*<>*<>*<>*<>*<>*<>*<>*<>*<>*<>*<>*<>*<>*<>*<>   -  Kidney functions  look a little dehydrated   -  Very important to drink adequate amounts of fluids to prevent permanent damage    -  Recommend drink at least 6 bottles (16 ounces) of fluids /water /day = 96 Oz ~100 oz  -  100 oz = 3,000 cc or 3 liters / day  - >> That's 1 &1/2 bottles of a 2 liter soda bottle /day !   <>*<>*<>*<>*<>*<>*<>*<>*<>*<>*<>*<>*<>*<>*<>*<>*<>*<>*<>*<>*<>*<>*<>*<>*<> <>*<>*<>*<>*<>*<>*<>*<>*<>*<>*<>*<>*<>*<>*<>*<>*<>*<>*<>*<>*<>*<>*<>*<>*<>  -  A1c = 6.3% getting very close to 6.5% which is Diabetes, So   - Avoid Sweets, Candy & White Stuff   - White Rice, White Industry, White Flour  - Breads &  Pasta  <>*<>*<>*<>*<>*<>*<>*<>*<>*<>*<>*<>*<>*<>*<>*<>*<>*<>*<>*<>*<>*<>*<>*<>*<> <>*<>*<>*<>*<>*<>*<>*<>*<>*<>*<>*<>*<>*<>*<>*<>*<>*<>*<>*<>*<>*<>*<>*<>*<>  -   Magnesium  = 1.8 is very very  low- goal is betw 2.0 - 2.5,   - So..............Marland Kitchen  Recommend that you take                                            Magnesium 500 mg tablet 3 x / day with Meals   - also important to eat lots of  leafy green vegetables   - spinach - Kale - collards - greens - okra - asparagus   - broccoli - quinoa - squash - almonds   - black, red, white beans -  peas - green  beans  <>*<>*<>*<>*<>*<>*<>*<>*<>*<>*<>*<>*<>*<>*<>*<>*<>*<>*<>*<>*<>*<>*<>*<>*<> <>*<>*<>*<>*<>*<>*<>*<>*<>*<>*<>*<>*<>*<>*<>*<>*<>*<>*<>*<>*<>*<>*<>*<>*<>  -  Chol = 107 - Wonderful  !   <>*<>*<>*<>*<>*<>*<>*<>*<>*<>*<>*<>*<>*<>*<>*<>*<>*<>*<>*<>*<>*<>*<>*<>*<> <>*<>*<>*<>*<>*<>*<>*<>*<>*<>*<>*<>*<>*<>*<>*<>*<>*<>*<>*<>*<>*<>*<>*<>*<>  -  Vitamin D = 99 - Also Excellent    !   <>*<>*<>*<>*<>*<>*<>*<>*<>*<>*<>*<>*<>*<>*<>*<>*<>*<>*<>*<>*<>*<>*<>*<>*<> <>*<>*<>*<>*<>*<>*<>*<>*<>*<>*<>*<>*<>*<>*<>*<>*<>*<>*<>*<>*<>*<>*<>*<>*<>  -  All Else - CBC - Kidneys - Electrolytes - Liver - Magnesium & Thyroid    - all  Normal / OK  <>*<>*<>*<>*<>*<>*<>*<>*<>*<>*<>*<>*<>*<>*<>*<>*<>*<>*<>*<>*<>*<>*<>*<>*<> <>*<>*<>*<>*<>*<>*<>*<>*<>*<>*<>*<>*<>*<>*<>*<>*<>*<>*<>*<>*<>*<>*<>*<>*<>

## 2023-03-03 ENCOUNTER — Other Ambulatory Visit: Payer: Self-pay | Admitting: Nurse Practitioner

## 2023-03-10 ENCOUNTER — Other Ambulatory Visit: Payer: Self-pay | Admitting: Internal Medicine

## 2023-03-10 DIAGNOSIS — Z961 Presence of intraocular lens: Secondary | ICD-10-CM | POA: Diagnosis not present

## 2023-03-10 DIAGNOSIS — H5213 Myopia, bilateral: Secondary | ICD-10-CM | POA: Diagnosis not present

## 2023-03-10 MED ORDER — OLMESARTAN MEDOXOMIL-HCTZ 20-12.5 MG PO TABS: ORAL_TABLET | ORAL | 3 refills | Status: AC

## 2023-03-16 ENCOUNTER — Other Ambulatory Visit: Payer: Self-pay | Admitting: Internal Medicine

## 2023-04-11 ENCOUNTER — Encounter (HOSPITAL_COMMUNITY): Payer: Self-pay | Admitting: Emergency Medicine

## 2023-04-11 ENCOUNTER — Emergency Department (HOSPITAL_COMMUNITY): Payer: Medicare PPO

## 2023-04-11 ENCOUNTER — Emergency Department (HOSPITAL_COMMUNITY)
Admission: EM | Admit: 2023-04-11 | Discharge: 2023-04-12 | Disposition: A | Discharge status: Home / Self Care | Payer: Medicare PPO | Attending: Emergency Medicine | Admitting: Emergency Medicine

## 2023-04-11 ENCOUNTER — Other Ambulatory Visit: Payer: Self-pay

## 2023-04-11 DIAGNOSIS — H538 Other visual disturbances: Secondary | ICD-10-CM | POA: Diagnosis not present

## 2023-04-11 DIAGNOSIS — R519 Headache, unspecified: Secondary | ICD-10-CM | POA: Diagnosis not present

## 2023-04-11 DIAGNOSIS — R5383 Other fatigue: Secondary | ICD-10-CM | POA: Diagnosis not present

## 2023-04-11 DIAGNOSIS — Z8673 Personal history of transient ischemic attack (TIA), and cerebral infarction without residual deficits: Secondary | ICD-10-CM | POA: Insufficient documentation

## 2023-04-11 DIAGNOSIS — N179 Acute kidney failure, unspecified: Secondary | ICD-10-CM | POA: Diagnosis not present

## 2023-04-11 DIAGNOSIS — Z7982 Long term (current) use of aspirin: Secondary | ICD-10-CM | POA: Insufficient documentation

## 2023-04-11 DIAGNOSIS — M5021 Other cervical disc displacement,  high cervical region: Secondary | ICD-10-CM | POA: Diagnosis not present

## 2023-04-11 DIAGNOSIS — M4802 Spinal stenosis, cervical region: Secondary | ICD-10-CM | POA: Diagnosis not present

## 2023-04-11 DIAGNOSIS — S199XXA Unspecified injury of neck, initial encounter: Secondary | ICD-10-CM | POA: Diagnosis not present

## 2023-04-11 DIAGNOSIS — R0602 Shortness of breath: Secondary | ICD-10-CM | POA: Diagnosis not present

## 2023-04-11 DIAGNOSIS — I10 Essential (primary) hypertension: Secondary | ICD-10-CM | POA: Diagnosis not present

## 2023-04-11 DIAGNOSIS — G319 Degenerative disease of nervous system, unspecified: Secondary | ICD-10-CM | POA: Diagnosis not present

## 2023-04-11 DIAGNOSIS — I6782 Cerebral ischemia: Secondary | ICD-10-CM | POA: Diagnosis not present

## 2023-04-11 DIAGNOSIS — M47812 Spondylosis without myelopathy or radiculopathy, cervical region: Secondary | ICD-10-CM | POA: Diagnosis not present

## 2023-04-11 DIAGNOSIS — R918 Other nonspecific abnormal finding of lung field: Secondary | ICD-10-CM | POA: Diagnosis not present

## 2023-04-11 DIAGNOSIS — J189 Pneumonia, unspecified organism: Secondary | ICD-10-CM | POA: Insufficient documentation

## 2023-04-11 DIAGNOSIS — Z89511 Acquired absence of right leg below knee: Secondary | ICD-10-CM | POA: Insufficient documentation

## 2023-04-11 DIAGNOSIS — W228XXA Striking against or struck by other objects, initial encounter: Secondary | ICD-10-CM | POA: Diagnosis not present

## 2023-04-11 DIAGNOSIS — I700 Atherosclerosis of aorta: Secondary | ICD-10-CM | POA: Diagnosis not present

## 2023-04-11 DIAGNOSIS — H53123 Transient visual loss, bilateral: Secondary | ICD-10-CM | POA: Diagnosis not present

## 2023-04-11 DIAGNOSIS — I959 Hypotension, unspecified: Secondary | ICD-10-CM | POA: Diagnosis not present

## 2023-04-11 DIAGNOSIS — R0902 Hypoxemia: Secondary | ICD-10-CM | POA: Diagnosis not present

## 2023-04-11 DIAGNOSIS — S0990XA Unspecified injury of head, initial encounter: Secondary | ICD-10-CM | POA: Diagnosis not present

## 2023-04-11 DIAGNOSIS — M4312 Spondylolisthesis, cervical region: Secondary | ICD-10-CM | POA: Diagnosis not present

## 2023-04-11 LAB — DIFFERENTIAL
Abs Immature Granulocytes: 0.15 10*3/uL — ABNORMAL HIGH (ref 0.00–0.07)
Basophils Absolute: 0.1 10*3/uL (ref 0.0–0.1)
Basophils Relative: 1 %
Eosinophils Absolute: 0.4 10*3/uL (ref 0.0–0.5)
Eosinophils Relative: 3 %
Immature Granulocytes: 1 %
Lymphocytes Relative: 7 %
Lymphs Abs: 0.9 10*3/uL (ref 0.7–4.0)
Monocytes Absolute: 1.1 10*3/uL — ABNORMAL HIGH (ref 0.1–1.0)
Monocytes Relative: 9 %
Neutro Abs: 10.5 10*3/uL — ABNORMAL HIGH (ref 1.7–7.7)
Neutrophils Relative %: 79 %

## 2023-04-11 LAB — COMPREHENSIVE METABOLIC PANEL
ALT: 17 U/L (ref 0–44)
AST: 23 U/L (ref 15–41)
Albumin: 3.4 g/dL — ABNORMAL LOW (ref 3.5–5.0)
Alkaline Phosphatase: 69 U/L (ref 38–126)
Anion gap: 9 (ref 5–15)
BUN: 25 mg/dL — ABNORMAL HIGH (ref 8–23)
CO2: 26 mmol/L (ref 22–32)
Calcium: 9.7 mg/dL (ref 8.9–10.3)
Chloride: 102 mmol/L (ref 98–111)
Creatinine, Ser: 1.88 mg/dL — ABNORMAL HIGH (ref 0.61–1.24)
GFR, Estimated: 35 mL/min — ABNORMAL LOW (ref >60)
Glucose, Bld: 101 mg/dL — ABNORMAL HIGH (ref 70–99)
Potassium: 4.5 mmol/L (ref 3.5–5.1)
Sodium: 137 mmol/L (ref 135–145)
Total Bilirubin: 0.5 mg/dL (ref 0.3–1.2)
Total Protein: 6.4 g/dL — ABNORMAL LOW (ref 6.5–8.1)

## 2023-04-11 LAB — I-STAT CHEM 8, ED
BUN: 26 mg/dL — ABNORMAL HIGH (ref 8–23)
Calcium, Ion: 1.24 mmol/L (ref 1.15–1.40)
Chloride: 101 mmol/L (ref 98–111)
Creatinine, Ser: 2 mg/dL — ABNORMAL HIGH (ref 0.61–1.24)
Glucose, Bld: 97 mg/dL (ref 70–99)
HCT: 37 % — ABNORMAL LOW (ref 39.0–52.0)
Hemoglobin: 12.6 g/dL — ABNORMAL LOW (ref 13.0–17.0)
Potassium: 4.5 mmol/L (ref 3.5–5.1)
Sodium: 136 mmol/L (ref 135–145)
TCO2: 26 mmol/L (ref 22–32)

## 2023-04-11 LAB — CBC
HCT: 36.9 % — ABNORMAL LOW (ref 39.0–52.0)
Hemoglobin: 12.1 g/dL — ABNORMAL LOW (ref 13.0–17.0)
MCH: 31.8 pg (ref 26.0–34.0)
MCHC: 32.8 g/dL (ref 30.0–36.0)
MCV: 96.9 fL (ref 80.0–100.0)
Platelets: 483 10*3/uL — ABNORMAL HIGH (ref 150–400)
RBC: 3.81 MIL/uL — ABNORMAL LOW (ref 4.22–5.81)
RDW: 13.5 % (ref 11.5–15.5)
WBC: 13.2 10*3/uL — ABNORMAL HIGH (ref 4.0–10.5)
nRBC: 0 % (ref 0.0–0.2)

## 2023-04-11 LAB — APTT: aPTT: 31 s (ref 24–36)

## 2023-04-11 LAB — ETHANOL: Alcohol, Ethyl (B): <10 mg/dL (ref <10)

## 2023-04-11 LAB — I-STAT CG4 LACTIC ACID, ED: Lactic Acid, Venous: 1.1 mmol/L (ref 0.5–1.9)

## 2023-04-11 LAB — CBG MONITORING, ED: Glucose-Capillary: 106 mg/dL — ABNORMAL HIGH (ref 70–99)

## 2023-04-11 LAB — PROTIME-INR
INR: 1.1 (ref 0.8–1.2)
Prothrombin Time: 14.3 s (ref 11.4–15.2)

## 2023-04-11 MED ORDER — LORAZEPAM 2 MG/ML IJ SOLN
1.0000 mg | Freq: Once | INTRAMUSCULAR | Status: AC
  Administered 2023-04-11: 1 mg via INTRAVENOUS

## 2023-04-11 MED ORDER — SODIUM CHLORIDE 0.9 % IV BOLUS
500.0000 mL | Freq: Once | INTRAVENOUS | Status: AC
  Administered 2023-04-11: 500 mL via INTRAVENOUS

## 2023-04-11 MED ORDER — LORAZEPAM 2 MG/ML IJ SOLN
INTRAMUSCULAR | Status: AC
  Filled 2023-04-11: qty 1

## 2023-04-11 NOTE — ED Notes (Signed)
Patient transported to MRI 

## 2023-04-11 NOTE — ED Notes (Signed)
Patient transported to CT 

## 2023-04-11 NOTE — Progress Notes (Signed)
Orthopedic Tech Progress Note Patient Details:  Leslie Cardenas [birth year] [id number]  Patient ID: Leslie Cardenas, male   DOB: [birth year], 82 y.o.   MRN: [id number] I attended trauma page. Trinna Post 04/11/2023, 7:59 PM

## 2023-04-11 NOTE — ED Provider Notes (Signed)
[name] Provider Note   CSN: [id number] Arrival date & time: 04/11/23  1951  An emergency department physician performed an initial assessment on this suspected stroke patient at 2003.  History {Add pertinent medical, surgical, social history, OB history to HPI:1} Chief Complaint  Patient presents with   Head Injury         Leslie Cardenas is a 82 y.o. male.   Head Injury Patient presents after head injury.  Level 2 trauma.  Reportedly was on a ladder and bumped his head.  However around an hour later began to have difficulty moving his right arm.  States it cramped up.  Also reported difficulty speaking.  And blurred vision.  States he has had some sparkles in his vision.  States there was a dark area in the center.  Last known well at 1845.  Is on Plavix for previous strokes.  Reportedly has been feeling bad with URI symptoms the last few days.     Home Medications Prior to Admission medications   Medication Sig Start Date End Date Taking? Authorizing Provider  acetaminophen (TYLENOL) 500 MG tablet Take 1,500 mg by mouth every 6 (six) hours as needed for mild pain or moderate pain. Patient only uses when needed.    [provider]  acetic acid-hydrocortisone (VOSOL-HC) OTIC solution Instill 3 to 4 drops to the affected ear 2 to 3 x /day 03/01/23   Lucky Cowboy, MD  albuterol (VENTOLIN HFA) 108 (90 Base) MCG/ACT inhaler Inhale 2 puffs into the lungs every 4 (four) hours as needed for wheezing or shortness of breath. Make sure you rinse mouth out after each use. 12/01/22   Omar Person, MD  atorvastatin (LIPITOR) 80 MG tablet TAKE 1 TABLET BY MOUTH EVERY DAY 09/24/22   Raynelle Dick, NP  azelastine (ASTELIN) 0.1 % nasal spray Place 1 spray into both nostrils 2 (two) times daily. Use in each nostril as directed 02/17/23   Charlott Holler, MD  Budeson-Glycopyrrol-Formoterol (BREZTRI AEROSPHERE) 160-9-4.8 MCG/ACT AERO  Inhale 2 puffs into the lungs in the morning and at bedtime. 12/01/22   Omar Person, MD  busPIRone (BUSPAR) 5 MG tablet TAKE 1/2 TO 1 TABLET 3 TIME A DAY FOR ANXIETY 12/09/22   Raynelle Dick, NP  clopidogrel (PLAVIX) 75 MG tablet Take 1 tablet (75 mg total) by mouth daily. 10/06/22 10/06/23  Windell Norfolk, MD  Dupilumab (DUPIXENT) 300 MG/2ML SOPN Inject 300 mg into the skin every 14 (fourteen) days. **loading dose received in clinic on 12/23/22 12/23/22   Charlott Holler, MD  ezetimibe (ZETIA) 10 MG tablet TAKE 1 TABLET BY MOUTH EVERY DAY FOR CHOLESTEROL 01/11/23   Raynelle Dick, NP  fexofenadine (ALLEGRA) 180 MG tablet Take 1 tablet by mouth daily. 06/15/22   [provider]  fluticasone (FLONASE) 50 MCG/ACT nasal spray Place 1 spray into both nostrils daily.    [provider]  IBUPROFEN PO Take by mouth. PRN    [provider]  ipratropium-albuterol (DUONEB) 0.5-2.5 (3) MG/3ML SOLN Take 3 mLs by nebulization every 6 (six) hours as needed. Max:4 doses per day Patient not taking: Reported on 02/17/2023 12/01/22   Omar Person, MD  loratadine (CLARITIN) 10 MG tablet TAKE ONE PILL ONCE DAILY AS NEEDED FOR ITCHING 09/18/22   Omar Person, MD  Multiple Vitamin (MULTIVITAMIN) tablet Take 1 tablet by mouth daily.    [provider]  nitroGLYCERIN (NITROSTAT) 0.4 MG  SL tablet Place 0.4 mg under the tongue every 5 (five) minutes as needed for chest pain.    [provider]  nystatin (MYCOSTATIN) 100000 UNIT/ML suspension Swish 5 mL in the mouth and retain for as long as possible (several minutes) before swallowing. 11/04/22   Adela Glimpse, NP  olmesartan-hydrochlorothiazide (BENICAR HCT) 20-12.5 MG tablet Take   1 tablet  Daily   for BP                                                     /                                                                   TAKE                                         BY                                                  MOUTH 03/10/23   Lucky Cowboy, MD  pantoprazole (PROTONIX) 40 MG tablet Take 1 tablet (40 mg total) by mouth daily. 01/22/23   Sherrilyn Rist, MD  predniSONE (DELTASONE) 5 MG tablet TAKE 1 TABLET DAILY AS DIRECTED FOR COPD 03/03/23   Raynelle Dick, NP  promethazine-dextromethorphan (PROMETHAZINE-DM) 6.25-15 MG/5ML syrup Take 5 mLs by mouth 4 (four) times daily as needed for cough. 10/07/22   Raynelle Dick, NP  VITAMIN D PO Take 5,000 Units by mouth daily. Taking 10,000 units daily    [provider]      Allergies    Patient has no known allergies.    Review of Systems   Review of Systems  Physical Exam Updated Vital Signs BP (!) 109/57   Pulse 60   Temp (!) 97.5 F (36.4 C) (Oral)   Resp 18   Ht 5\' 8"  (1.727 m)   Wt 63.5 kg   SpO2 99%   BMI 21.29 kg/m  Physical Exam Vitals reviewed.  HENT:     Head: Atraumatic.  Eyes:     Comments: Conjugate gaze but nystagmus with gaze to left and right.  Eye movements otherwise intact.  No visual field deficits.  Cardiovascular:     Rate and Rhythm: Normal rate.  Chest:     Chest wall: No tenderness.  Abdominal:     Tenderness: There is no abdominal tenderness.  Musculoskeletal:        General: No tenderness.     Cervical back: No tenderness.     Comments: Right below the knee amputation.  Skin:    General: Skin is warm.  Neurological:     Mental Status: He is alert.     Comments: Awake and appropriate.  Eye movements intact but nystagmus.  Does have conjugate gaze.  No visual field cuts.  Face symmetric.  However does have finger-nose that is off on the right side.  Good grip strength bilaterally.  Awake and appropriate.     ED Results / Procedures / Treatments   Labs (all labs ordered are listed, but only abnormal results are displayed) Labs Reviewed  CBC - Abnormal; Notable for the following components:      Result Value   WBC 13.2 (*)    RBC 3.81 (*)    Hemoglobin 12.1 (*)    HCT 36.9 (*)     Platelets 483 (*)    All other components within normal limits  DIFFERENTIAL - Abnormal; Notable for the following components:   Neutro Abs 10.5 (*)    Monocytes Absolute 1.1 (*)    Abs Immature Granulocytes 0.15 (*)    All other components within normal limits  COMPREHENSIVE METABOLIC PANEL - Abnormal; Notable for the following components:   Glucose, Bld 101 (*)    BUN 25 (*)    Creatinine, Ser 1.88 (*)    Total Protein 6.4 (*)    Albumin 3.4 (*)    GFR, Estimated 35 (*)    All other components within normal limits  CBG MONITORING, ED - Abnormal; Notable for the following components:   Glucose-Capillary 106 (*)    All other components within normal limits  I-STAT CHEM 8, ED - Abnormal; Notable for the following components:   BUN 26 (*)    Creatinine, Ser 2.00 (*)    Hemoglobin 12.6 (*)    HCT 37.0 (*)    All other components within normal limits  ETHANOL  PROTIME-INR  APTT  RAPID URINE DRUG SCREEN, HOSP PERFORMED  URINALYSIS, ROUTINE W REFLEX MICROSCOPIC  I-STAT CG4 LACTIC ACID, ED    EKG None  Radiology DG Chest Portable 1 View  Result Date: 04/11/2023 CLINICAL DATA:  Shortness of breath EXAM: PORTABLE CHEST 1 VIEW COMPARISON:  CTA chest dated 10/21/2022 FINDINGS: Mild patchy right upper lobe opacity, suspicious for pneumonia. Mild bibasilar opacities, atelectasis versus pneumonia. No pleural effusion or pneumothorax. The heart is normal in size.  Thoracic aortic atherosclerosis. IMPRESSION: Mild patchy right upper lobe opacity, suspicious for pneumonia. Mild bibasilar opacities, atelectasis versus pneumonia. Electronically Signed   By: Charline Bills M.D.   On: 04/11/2023 20:50   CT Cervical Spine Wo Contrast  Result Date: 04/11/2023 CLINICAL DATA:  Level 2 trauma EXAM: CT CERVICAL SPINE WITHOUT CONTRAST TECHNIQUE: Multidetector CT imaging of the cervical spine was performed without intravenous contrast. Multiplanar CT image reconstructions were also generated.  RADIATION DOSE REDUCTION: This exam was performed according to the departmental dose-optimization program which includes automated exposure control, adjustment of the mA and/or kV according to patient size and/or use of iterative reconstruction technique. COMPARISON:  None Available. FINDINGS: Alignment: Loss of the normal mid cervical lordosis, degenerative. Skull base and vertebrae: No acute fracture. No primary bone lesion or focal pathologic process. Soft tissues and spinal canal: No prevertebral fluid or swelling. No visible canal hematoma. Disc levels: Mild degenerative changes, most prominent at C5-6 and C6-7. 3 mm anterolisthesis of C4 on C5. Spinal canal is patent. Upper chest: Mild biapical pleural-parenchymal scarring. Other: Visualized thyroid is unremarkable. IMPRESSION: No traumatic injury to the cervical spine. Mild degenerative changes. Electronically Signed   By: Charline Bills M.D.   On: 04/11/2023 20:23   CT HEAD CODE STROKE WO CONTRAST  Result Date: 04/11/2023 CLINICAL DATA:  Code stroke. Initial evaluation for neuro deficit, stroke suspected. EXAM: CT HEAD WITHOUT CONTRAST TECHNIQUE: Contiguous axial images  were obtained from the base of the skull through the vertex without intravenous contrast. RADIATION DOSE REDUCTION: This exam was performed according to the departmental dose-optimization program which includes automated exposure control, adjustment of the mA and/or kV according to patient size and/or use of iterative reconstruction technique. COMPARISON:  Prior study from 06/11/2022. FINDINGS: Brain: Age-related cerebral atrophy with chronic small vessel ischemic disease. No acute intracranial hemorrhage. No acute large vessel territory infarct. No mass lesion or midline shift. No hydrocephalus or extra-axial fluid collection. Vascular: No abnormal hyperdense vessel. Scattered vascular calcifications noted within the carotid siphons. Skull: Scalp soft tissues within normal limits.   Calvarium intact. Sinuses/Orbits: Globes and orbital soft tissues within normal limits. Paranasal sinuses are largely clear. Nasal septum appears partially dehiscent. Trace bilateral mastoid effusions noted, of doubtful significance. Other: None. ASPECTS [facility] Stroke Program Early CT Score) - Ganglionic level infarction (caudate, lentiform nuclei, internal capsule, insula, M1-M3 cortex): 7 - Supraganglionic infarction (M4-M6 cortex): 3 Total score (0-10 with 10 being normal): 10 IMPRESSION: 1. No acute intracranial abnormality. 2. ASPECTS is 10. 3. Age-related cerebral atrophy with moderate chronic microvascular ischemic disease. These results were communicated to Dr. Otelia Limes at 8:20 pm on 04/11/2023 by text page via the [facility] messaging system. Electronically Signed   By: Rise Mu M.D.   On: 04/11/2023 20:21    Procedures Procedures  {Document cardiac monitor, telemetry assessment procedure when appropriate:1}  Medications Ordered in ED Medications  LORazepam (ATIVAN) injection 1 mg (has no administration in time range)  sodium chloride 0.9 % bolus 500 mL (500 mLs Intravenous New Bag/Given 04/11/23 2202)    ED Course/ Medical Decision Making/ A&P   {   Click here for ABCD2, HEART and other calculatorsREFRESH Note before signing :1}                              Medical Decision Making Amount and/or Complexity of Data Reviewed Labs: ordered. Radiology: ordered.  Risk Prescription drug management.   Patient with head injury.  Came as a level 2 trauma.  Hit head on Plavix.  However did develop some neurodeficits with blurred vision and some difficulty moving his right arm.  Code stroke called due to focal neurodeficits.  Seen in the CT scanner by Dr. Otelia Limes.  Not a TNK candidate due to mild symptoms.  Potentially could be postconcussive.  Hit head before symptoms developed.  Head CT and cervical spine CT independently reviewed and reassuring.  Will get MRI to evaluate for causes  such as stroke.  Also cervical spine injury since he hit his head and then had unilateral weakness.  Creatinine mildly elevated.  Will give small fluid bolus.  Has somewhat decreased fluid intake.  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  Patient being taken MRI.  Care turned over to Dr.Bero   {Document critical care time when appropriate:1} {Document review of labs and clinical decision tools ie heart score, Chads2Vasc2 etc:1}  {Document your independent review of radiology images, and any outside records:1} {Document your discussion with family members, caretakers, and with consultants:1} {Document social determinants of health affecting pt's care:1} {Document your decision making why or why not admission, treatments were needed:1} Final Clinical Impression(s) / ED Diagnoses Final diagnoses:  Injury of head, initial encounter  AKI (acute kidney injury) (HCC)    Rx / DC Orders ED Discharge Orders     None

## 2023-04-11 NOTE — ED Notes (Signed)
Decision for no TPA per Dr. Otelia Limes.

## 2023-04-11 NOTE — Code Documentation (Signed)
Stroke Response Nurse Documentation Code Documentation  Leslie Cardenas is a 82 y.o. male arriving to [facility]  via [facility] on 11/3 with past medical hx of COPD, CAD with MI, HLD, TIA. On clopidogrel 75 mg daily. Code stroke was activated by ED.   Patient from home where he was LKW at 1845 after a short fall and now complaining of blurry vision and right arm weakness.   Stroke team at the bedside on patient arrival. Labs drawn and patient cleared for CT by Dr. Rubin Payor. Patient to CT with team. NIHSS 0, see documentation for details and code stroke times. Patient with no deficits on exam. The following imaging was completed:  CT Head. Patient is not a candidate for IV Thrombolytic due to too mild to treat/resolving symptoms. Patient is not a candidate for IR due to low suspicion for stroke.   Care Plan: Neuro checks q2hrs.   Bedside handoff with ED RN Jeannett Senior.    Rose Fillers  Rapid Response RN

## 2023-04-11 NOTE — ED Triage Notes (Signed)
Pt to ED via [facility] from home as lvl 2 trauma after stepping up on ladder in the house and hitting head on roof, denies falling or LOC.  Pt began to have blurry vision approx 1 hour later.  Hx of stroke and on plavix with left side weakness and left face droop.  EMS vitals 174 CBG, 117/67 BP, was 80% RA on scene and placed on 3L [place] up to 99% but has stage 4 COPD.  Stroke screen neg for EMS.  Dr. Rubin Payor at bedside on arrival.

## 2023-04-11 NOTE — Consult Note (Incomplete)
NEURO HOSPITALIST CONSULT NOTE   Requesting physician: Dr. Rubin Payor  Reason for Consult: Acute onset of bilateral scintillating scotomata and right arm spasm after striking head  History obtained from:  Patient and Chart     HPI:                                                                                                                                          Leslie Cardenas is an 82 y.o. 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 He denies falling or LOC. About one hour after striking his head, the patient began to have blurry vision bilaterally in conjunction with scintillating scotomata in his central vision bilaterally described as "flashing lights", "stars" and "zig-zag lines". At that time he also felt his RUE "drawing up" into a flexed position at the elbow with spasm-like contortion of his right hand. EMS was called and on their arrival his vitals were 174 CBG, 117/67 BP, 80% RA. He was placed on 3L [place] with improvement of O2 saturations up to 99%. Stroke screen was negative for EMS.   In the ED he also reported having had difficulty moving his RUE and difficulty speaking. Nystagmus on left and right gaze was also noted. Right finger to nose testing was felt to be abnormal. He continued to complain of visual blurring. Code Stroke was then called. LKN [id number].  Home medications include Plavix.   Past Medical History:  Diagnosis Date   Aortic atherosclerosis (HCC) 03/04/2018   Noted on CT Abd/pelvis   Asthma    Cataract    Bilateral   Colon polyp    COPD (chronic obstructive pulmonary disease) (HCC)     stage 3   Coronary artery disease    Diverticulosis 07/16/2016   SIGMOID, NOTED ON COLONOSCOPY   Esophageal stenosis 02/24/2018   noted on endoscopy   Esophageal stricture    Former tobacco use    GERD (gastroesophageal reflux disease)    Grade I diastolic dysfunction 08/09/2017   noted on ECHO   History of hiatal hernia 02/24/2018   Small noted on endoscopy   Hydronephrosis    Hydronephrosis of right kidney 03/04/2018   URETERAL STONE 7 MM, NOTED ONJ CT ABD/PELVIS   Hyperlipidemia    Myocardial infarction [facility])    [id number]   Nephrolithiasis    s/p stent placement   Skin cancer    Traumatic amputation of right leg (HCC)  Ureteral stone 03/04/2018   URETERAL STONE 7 MM. NOTED ON CT ABD/PELVIS   Vitamin D deficiency    Wears dentures    upper    Past Surgical History:  Procedure Laterality Date   CARDIAC CATHETERIZATION  1983   No intervention performed.    CATARACT EXTRACTION, BILATERAL     COLONOSCOPY     COLONOSCOPY W/ POLYPECTOMY  08/12/2010   Dr. Stan Head   COLONOSCOPY W/ POLYPECTOMY  07/16/2016   CYSTOSCOPY W/ URETERAL STENT PLACEMENT Right 03/04/2018   Procedure: CYSTOSCOPY WITH RETROGRADE PYELOGRAM/URETERAL STENT PLACEMENT;  Surgeon: Crista Elliot, MD;  [facility] OR;  Service: Urology;  Laterality: Right;   CYSTOSCOPY/URETEROSCOPY/HOLMIUM LASER/STENT PLACEMENT Right 03/16/2018   Procedure: CYSTOSCOPY RIGHT URETEROSCOPY/HOLMIUM LASER/STENT PLACEMENT;  Surgeon: Crista Elliot, MD;  Location: [facility];  Service: Urology;  Laterality: Right;   ESOPHAGOGASTRODUODENOSCOPY     INGUINAL HERNIA REPAIR     left   right leg amputation  05/14/1977   SHOULDER ARTHROSCOPY Left 2000   UPPER GASTROINTESTINAL ENDOSCOPY  WITH DILATION  02/24/2018    Family History  Problem Relation Age of Onset   Pancreatic cancer Mother    Hypertension Father    Colon cancer Brother    Leukemia Brother    Prostate cancer Brother    Other Brother         polio   Esophageal cancer Neg Hx    Rectal cancer Neg Hx    Stomach cancer Neg Hx              Social History:  reports that he quit smoking about 23 years ago. His smoking use included cigarettes. He started smoking about 43 years ago. He has a 60 pack-year smoking history. He has never used smokeless tobacco. He reports that he does not currently use alcohol. He reports that he does not use drugs.  No Known Allergies  MEDICATIONS:                                                                                                                     No current facility-administered medications on file prior to encounter.   Current Outpatient Medications on File Prior to Encounter  Medication Sig Dispense Refill   acetaminophen (TYLENOL) 500 MG tablet Take 1,500 mg by mouth every 6 (six) hours as needed for mild pain or moderate pain. Patient only uses when needed.     acetic acid-hydrocortisone (VOSOL-HC) OTIC solution Instill 3 to 4 drops to the affected ear 2 to 3 x /day 10 mL 1   albuterol (VENTOLIN HFA) 108 (90 Base) MCG/ACT inhaler Inhale 2 puffs into the lungs every 4 (four) hours as needed for wheezing or shortness of breath. Make sure you rinse mouth out after each use. 18 g 2   atorvastatin (LIPITOR) 80 MG tablet TAKE 1 TABLET BY MOUTH EVERY DAY 90 tablet 1   azelastine (ASTELIN) 0.1 % nasal spray Place 1 spray into both nostrils 2 (two)  times daily. Use in each nostril as directed 30 mL 12   Budeson-Glycopyrrol-Formoterol (BREZTRI AEROSPHERE) 160-9-4.8 MCG/ACT AERO Inhale 2 puffs into the lungs in the morning and at bedtime. 1 each 11   busPIRone (BUSPAR) 5 MG tablet TAKE 1/2 TO 1 TABLET 3 TIME A DAY FOR ANXIETY 270 tablet 0   clopidogrel (PLAVIX) 75 MG tablet Take 1 tablet (75 mg total) by mouth daily. 30 tablet 11   Dupilumab (DUPIXENT) 300 MG/2ML SOPN Inject 300 mg into the skin every 14 (fourteen) days. **loading dose received in clinic on 12/23/22 12 mL 1   ezetimibe (ZETIA) 10 MG  tablet TAKE 1 TABLET BY MOUTH EVERY DAY FOR CHOLESTEROL 90 tablet 3   fexofenadine (ALLEGRA) 180 MG tablet Take 1 tablet by mouth daily.     fluticasone (FLONASE) 50 MCG/ACT nasal spray Place 1 spray into both nostrils daily.     IBUPROFEN PO Take by mouth. PRN     ipratropium-albuterol (DUONEB) 0.5-2.5 (3) MG/3ML SOLN Take 3 mLs by nebulization every 6 (six) hours as needed. Max:4 doses per day (Patient not taking: Reported on 02/17/2023) 1080 each 1   loratadine (CLARITIN) 10 MG tablet TAKE ONE PILL ONCE DAILY AS NEEDED FOR ITCHING 90 tablet 3   Multiple Vitamin (MULTIVITAMIN) tablet Take 1 tablet by mouth daily.     nitroGLYCERIN (NITROSTAT) 0.4 MG SL tablet Place 0.4 mg under the tongue every 5 (five) minutes as needed for chest pain.     nystatin (MYCOSTATIN) 100000 UNIT/ML suspension Swish 5 mL in the mouth and retain for as long as possible (several minutes) before swallowing. 60 mL 0   olmesartan-hydrochlorothiazide (BENICAR HCT) 20-12.5 MG tablet Take   1 tablet  Daily   for BP                                                     /                                                                   TAKE                                         BY                                                 MOUTH 90 tablet 3   pantoprazole (PROTONIX) 40 MG tablet Take 1 tablet (40 mg total) by mouth daily. 90 tablet 3   predniSONE (DELTASONE) 5 MG tablet TAKE 1 TABLET DAILY AS DIRECTED FOR COPD 90 tablet 0   promethazine-dextromethorphan (PROMETHAZINE-DM) 6.25-15 MG/5ML syrup Take 5 mLs by mouth 4 (four) times daily as needed for cough. 240 mL 1   VITAMIN D PO Take 5,000 Units by mouth daily. Taking 10,000 units daily       ROS:  The patient denies any confusion or sensory numbness. Positive for neck pain since striking his head. Positive for malaise with URI symptoms over the  past few days. Other ROS as per HPI.   Blood pressure 115/75, pulse 69, temperature (!) 97.5 F (36.4 C), temperature source Oral, resp. rate (!) 25, height 5\' 8"  (1.727 m), weight 63.5 kg, SpO2 99%.   General Examination:                                                                                                       Physical Exam  HEENT-  Normocephalic with no external evidence for head trauma.  Lungs- Respirations unlabored Extremities- No edema. Right BKA.   Neurological Examination Mental Status: Alert, oriented x 5, thought content appropriate.  Speech fluent without evidence of aphasia. No dysarthria. Able to follow all commands without difficulty. Cranial Nerves: II: Temporal visual fields intact with no extinction to DSS. PERRL Has visual obscuration centrally to both eyes with scintillating scotomata. III,IV, VI: No ptosis. EOMI. No nystagmus at the time of neurology evaluation.  V: Temp sensation and FT decreased on the right  VII: Smile symmetric VIII: Hearing intact to voice IX,X: No hoarseness XI: Symmetric shoulder shrug XII: Midline tongue extension Motor: BUE 5/5 proximally and distally with normal tone and no asymmetry. LLE 5/5 proximally and distally  RLE 5/5 proximally (BKA) No pronator drift.  Sensory: Temp and light touch decreased to RUE, normal to LUE. Touch sensation intact to BLE proximally. No extinction to DSS.  Deep Tendon Reflexes: 1+ left patellar. Unable to elicit right patellar secondary to prosthesis. 1+ bilateral brachioradialis and biceps.  Cerebellar: No ataxia with FNF bilaterally. No asymmetry when comparing right and left sided movements.  Gait: Deferred  NIHSS: 1   Lab Results: Basic Metabolic Panel: Recent Labs  Lab 04/11/23 1955 04/11/23 2004  NA 137 136  K 4.5 4.5  CL 102 101  CO2 26  --   GLUCOSE 101* 97  BUN 25* 26*  CREATININE 1.88* 2.00*  CALCIUM 9.7  --     CBC: Recent Labs  Lab 04/11/23 1955 04/11/23 2004   WBC 13.2*  --   NEUTROABS 10.5*  --   HGB 12.1* 12.6*  HCT 36.9* 37.0*  MCV 96.9  --   PLT 483*  --     Cardiac Enzymes: No results for input(s): "CKTOTAL", "CKMB", "CKMBINDEX", "TROPONINI" in the last 168 hours.  Lipid Panel: No results for input(s): "CHOL", "TRIG", "HDL", "CHOLHDL", "VLDL", "LDLCALC" in the last 168 hours.  Imaging: CT Cervical Spine Wo Contrast  Result Date: 04/11/2023 CLINICAL DATA:  Level 2 trauma EXAM: CT CERVICAL SPINE WITHOUT CONTRAST TECHNIQUE: Multidetector CT imaging of the cervical spine was performed without intravenous contrast. Multiplanar CT image reconstructions were also generated. RADIATION DOSE REDUCTION: This exam was performed according to the departmental dose-optimization program which includes automated exposure control, adjustment of the mA and/or kV according to patient size and/or use of iterative reconstruction technique. COMPARISON:  None Available. FINDINGS: Alignment: Loss of the normal mid cervical lordosis, degenerative. Skull base  and vertebrae: No acute fracture. No primary bone lesion or focal pathologic process. Soft tissues and spinal canal: No prevertebral fluid or swelling. No visible canal hematoma. Disc levels: Mild degenerative changes, most prominent at C5-6 and C6-7. 3 mm anterolisthesis of C4 on C5. Spinal canal is patent. Upper chest: Mild biapical pleural-parenchymal scarring. Other: Visualized thyroid is unremarkable. IMPRESSION: No traumatic injury to the cervical spine. Mild degenerative changes. Electronically Signed   By: Charline Bills M.D.   On: 04/11/2023 20:23   CT HEAD CODE STROKE WO CONTRAST  Result Date: 04/11/2023 CLINICAL DATA:  Code stroke. Initial evaluation for neuro deficit, stroke suspected. EXAM: CT HEAD WITHOUT CONTRAST TECHNIQUE: Contiguous axial images were obtained from the base of the skull through the vertex without intravenous contrast. RADIATION DOSE REDUCTION: This exam was performed according to  the departmental dose-optimization program which includes automated exposure control, adjustment of the mA and/or kV according to patient size and/or use of iterative reconstruction technique. COMPARISON:  Prior study from 06/11/2022. FINDINGS: Brain: Age-related cerebral atrophy with chronic small vessel ischemic disease. No acute intracranial hemorrhage. No acute large vessel territory infarct. No mass lesion or midline shift. No hydrocephalus or extra-axial fluid collection. Vascular: No abnormal hyperdense vessel. Scattered vascular calcifications noted within the carotid siphons. Skull: Scalp soft tissues within normal limits.  Calvarium intact. Sinuses/Orbits: Globes and orbital soft tissues within normal limits. Paranasal sinuses are largely clear. Nasal septum appears partially dehiscent. Trace bilateral mastoid effusions noted, of doubtful significance. Other: None. ASPECTS [facility] Stroke Program Early CT Score) - Ganglionic level infarction (caudate, lentiform nuclei, internal capsule, insula, M1-M3 cortex): 7 - Supraganglionic infarction (M4-M6 cortex): 3 Total score (0-10 with 10 being normal): 10 IMPRESSION: 1. No acute intracranial abnormality. 2. ASPECTS is 10. 3. Age-related cerebral atrophy with moderate chronic microvascular ischemic disease. These results were communicated to Dr. Otelia Limes at 8:20 pm on 04/11/2023 by text page via the [facility] messaging system. Electronically Signed   By: Rise Mu M.D.   On: 04/11/2023 20:21     Assessment: 82 year old male with a prior history of stroke with residual left sided weakness presenting to the ED with acute onset of bilateral scintillating scotomata and right arm spasm after striking his head without loss of consciousness - Exam reveals right face and RUE sensory numbness. No focal weakness or ataxia is noted. NIHSS 1.  - CT head: No acute intracranial abnormality. ASPECTS is 10. Age-related cerebral atrophy with moderate chronic  microvascular ischemic disease. - CT C-spine: No traumatic injury to the cervical spine. Mild degenerative changes. - Labs: - BUN 25, Cr 1.88, eGFR 35. Cr was 1.32 on 03/01/23.  - Na and K normal.  - Ca normal.  - WBC 13.2. Platelets also elevated at 483. Hgb 12.6.  - Lactate 1.1 - Coags normal.  - Glucose 101.  - Recent HgbA1c was 6.3 in September.  - EtOH < 10 - EKG: Sinus rhythm, consider right ventricular hypertrophy - Presentation is felt most likely to be secondary to a mild post-concussive syndrome. Lower on the DDx is vertebral artery dissection secondary to the head impact with possible thrombus formation and distal embolization to the occipital lobes and left thalamus.   Recommendations: - MRI brain (ordered) - Management of possible AKI per primary team.  - Repeat BUN and Cr after IV hydration - CTA of head and neck to rule out occlusion and/or dissection if repeat eGFR continues to be above 30 after IV hydration. If unable to obtain  CTA, will need MRA of head and neck WITHOUT contrast.  - Frequent neuro checks - Further recommendations following MRI brain.    Electronically signed: Dr. Caryl Pina 04/11/2023, 8:49 PM

## 2023-04-11 NOTE — ED Notes (Signed)
LKW [id number] with blurry vision and right arm weakness.

## 2023-04-11 NOTE — ED Notes (Signed)
Dr. Rubin Payor called code stroke

## 2023-04-12 MED ORDER — DOXYCYCLINE HYCLATE 100 MG PO CAPS
100.0000 mg | ORAL_CAPSULE | Freq: Two times a day (BID) | ORAL | 0 refills | Status: AC
Start: 2023-04-12 — End: 2023-04-22

## 2023-04-12 NOTE — ED Provider Notes (Signed)
  Provider Note MRN:  [id number]  Arrival date & time: 04/12/23    ED Course and Medical Decision Making  Assumed care from Dr. Rubin Payor at shift change.  Patient presenting as a level 2 trauma, hit his head while on a ladder.  Then started having some unilateral neurological deficits.  Some concern for stroke.  Awaiting MRI.  Procedures  Final Clinical Impressions(s) / ED Diagnoses     ICD-10-CM   1. Injury of head, initial encounter  S09.90XA     2. AKI (acute kidney injury) [facility])  N17.9       ED Discharge Orders     None       Discharge Instructions   None     Elmer Sow. Pilar Plate, MD [facility] Emergency Medicine [facility] [email or web address]

## 2023-04-12 NOTE — Discharge Instructions (Addendum)
You were evaluated in the Emergency Department and after careful evaluation, we did not find any emergent condition requiring admission or further testing in the hospital.  Your exam/testing today was overall reassuring.  Take the antibiotics to treat for possible pneumonia.  Follow-up with the spinal experts as we discussed.  Drink more fluids.  Please return to the Emergency Department if you experience any worsening of your condition.  Thank you for allowing [place] to be a part of your care.

## 2023-04-14 DIAGNOSIS — M542 Cervicalgia: Secondary | ICD-10-CM | POA: Diagnosis not present

## 2023-04-27 ENCOUNTER — Telehealth: Payer: Self-pay | Admitting: Internal Medicine

## 2023-04-27 DIAGNOSIS — J4489 Other specified chronic obstructive pulmonary disease: Secondary | ICD-10-CM

## 2023-04-27 MED ORDER — DUPIXENT 300 MG/2ML ~~LOC~~ SOAJ: 300.0000 mg | SUBCUTANEOUS | 1 refills | Status: DC

## 2023-04-27 NOTE — Telephone Encounter (Signed)
Refill sent to [facility]. Called patient to advise - left VM  Chesley Mires, PharmD, MPH, BCPS, CPP Clinical Pharmacist (Rheumatology and Pulmonology)

## 2023-04-27 NOTE — Telephone Encounter (Signed)
Patient needs refills of Dupixent.  Dupixent [id number] Pharmacy [phone]

## 2023-04-30 ENCOUNTER — Other Ambulatory Visit: Payer: Self-pay | Admitting: Nurse Practitioner

## 2023-04-30 DIAGNOSIS — E782 Mixed hyperlipidemia: Secondary | ICD-10-CM

## 2023-05-12 ENCOUNTER — Ambulatory Visit: Payer: Medicare PPO | Admitting: Internal Medicine

## 2023-05-12 ENCOUNTER — Encounter: Payer: Self-pay | Admitting: Internal Medicine

## 2023-05-12 VITALS — BP 118/72 | HR 80 | Ht 68.0 in | Wt 140.0 lb

## 2023-05-12 DIAGNOSIS — E270 Other adrenocortical overactivity: Secondary | ICD-10-CM | POA: Diagnosis not present

## 2023-05-12 DIAGNOSIS — J310 Chronic rhinitis: Secondary | ICD-10-CM

## 2023-05-12 DIAGNOSIS — J439 Emphysema, unspecified: Secondary | ICD-10-CM | POA: Diagnosis not present

## 2023-05-12 DIAGNOSIS — J3089 Other allergic rhinitis: Secondary | ICD-10-CM

## 2023-05-12 DIAGNOSIS — J4489 Other specified chronic obstructive pulmonary disease: Secondary | ICD-10-CM

## 2023-05-12 DIAGNOSIS — D7219 Other eosinophilia: Secondary | ICD-10-CM | POA: Diagnosis not present

## 2023-05-12 NOTE — Patient Instructions (Addendum)
It was a pleasure to see you today!  Please schedule follow up scheduled with myself in 4 months.  If my schedule is not open yet, we will contact you with a reminder closer to that time. Please call [phone] if you haven't heard from [place] a month before, and always call us sooner if issues or concerns arise. You can also send [place] a message through MyChart, but but aware that this is not to be used for urgent issues and it may take up to 5-7 days to receive a reply. Please be aware that you will likely be able to view your results before I have a chance to respond to them. Please give [place] 5 business days to respond to any non-urgent results.    For rhinitis continue flonase, and astelin nasal spray  Continue the dupixent injections Continue breztri 2 puffs twice daily, take albuterol in between if you need something rather than extra breztri.  Use flutter valve after inhaler treatments to help cough up mucus.   Go down to 2.5 once every other day for the next month. Then you can stop the prednisone.

## 2023-05-12 NOTE — Progress Notes (Signed)
[name]    [id number]    [birth year]  Primary Care Physician:McKeown, Chrissie Noa, MD Date of Appointment: 05/12/2023 Established Patient Visit  Chief complaint:   Chief Complaint  Patient presents with   Follow-up    3 month follow up , some better with sob , no new changes      HPI: Leslie Cardenas is a 82 y.o. man with COPD former tobacco use quit 2001, CAD, esophageal strictures. 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 Doesn't feel any worse while tapering.  Taking albuterol once in the morning and sometimes during the day with exertion. On average 1-2 times/day.   Has mucus in his chest that he coughs up mostly in the morning  Feels that the nebulizer machine makes him feel worse than the inhaler. Doesn't use it.  Took dupixent Monday, no issues.  Taking flonase and astelin for allergies once daily.   Mobility limited more by hip and back pain rather than breathing.   Has a dog at home and 2 cats.   Social history 60 pack years, quit 2001.  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  Lives at home with his ex-wife - lives in [place].  No childhood respiratory disease.   I have reviewed the patient's family social and past medical history and updated as appropriate.   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   Coronary artery disease    Diverticulosis 07/16/2016   SIGMOID, NOTED ON COLONOSCOPY   Esophageal stenosis 02/24/2018   noted on endoscopy   Esophageal stricture    Former tobacco use    GERD (gastroesophageal reflux disease)    Grade I diastolic dysfunction 08/09/2017   noted on ECHO    History of hiatal hernia 02/24/2018   Small noted on endoscopy   Hydronephrosis    Hydronephrosis of right kidney 03/04/2018   URETERAL STONE 7 MM, NOTED ONJ CT ABD/PELVIS   Hyperlipidemia    Myocardial infarction [facility])    [id number]   Nephrolithiasis    s/p stent placement   Skin cancer    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 NOTED ON CT ABD/PELVIS   Vitamin D deficiency    Wears dentures    upper    Past Surgical History:  Procedure Laterality Date   CARDIAC CATHETERIZATION  1983   No intervention performed.    CATARACT EXTRACTION, BILATERAL     COLONOSCOPY     COLONOSCOPY W/ POLYPECTOMY  08/12/2010   Dr. Stan Head   COLONOSCOPY W/ POLYPECTOMY  07/16/2016   CYSTOSCOPY W/ URETERAL STENT PLACEMENT Right 03/04/2018   Procedure: CYSTOSCOPY WITH RETROGRADE PYELOGRAM/URETERAL STENT PLACEMENT;  Surgeon: Crista Elliot, MD;  Location: [facility] OR;  Service: Urology;  Laterality: Right;   CYSTOSCOPY/URETEROSCOPY/HOLMIUM LASER/STENT PLACEMENT Right 03/16/2018   Procedure: CYSTOSCOPY RIGHT URETEROSCOPY/HOLMIUM LASER/STENT PLACEMENT;  Surgeon: Crista Elliot, MD;  Location: [facility];  Service: Urology;  Laterality: Right;   ESOPHAGOGASTRODUODENOSCOPY     INGUINAL HERNIA  REPAIR     left   right leg amputation  05/14/1977   SHOULDER ARTHROSCOPY Left 2000   UPPER GASTROINTESTINAL ENDOSCOPY  WITH DILATION  02/24/2018    Family History  Problem Relation Age of Onset   Pancreatic cancer Mother    Hypertension Father    Colon cancer Brother    Leukemia Brother    Prostate cancer Brother    Other Brother        polio   Esophageal cancer Neg Hx    Rectal cancer Neg Hx    Stomach cancer Neg Hx     Social History   Occupational History   Occupation: retired    Associate Professor: RETIRED  Tobacco Use   Smoking status: Former    Current packs/day: 0.00    Average packs/day: 3.0 packs/day for 20.0 years (60.0 ttl  pk-yrs)    Types: Cigarettes    Start date: 04/11/1980    Quit date: 04/11/2000    Years since quitting: 23.0   Smokeless tobacco: Never  Vaping Use   Vaping status: Never Used  Substance and Sexual Activity   Alcohol use: Not Currently    Comment: rare   Drug use: No   Sexual activity: Not Currently     Physical Exam: Blood pressure 118/72, pulse 80, height 5\' 8"  (1.727 m), weight 140 lb (63.5 kg), SpO2 94%.  Gen:      No acute distress ENT:  no nasal polyps, mucus membranes moist, no nasal polyps Lungs:   decreased air entry, mild wheezing, no respiratory distress CV:         Regular rate and rhythm; no murmurs, rubs, or gallops.  No pedal edema   Data Reviewed: Imaging: I have personally reviewed the ct angio may 2024 shows moderate centrilobular emphysema  PFTs:      No data to display         I have personally reviewed the patient's PFTs and sever ariflow limitation + BD response.  ONO Jan 2023 - required oxygen   Labs:  Lab Results  Component Value Date   WBC 13.2 (H) 04/11/2023   HGB 12.6 (L) 04/11/2023   HCT 37.0 (L) 04/11/2023   MCV 96.9 04/11/2023   PLT 483 (H) 04/11/2023   Lab Results  Component Value Date   NA 136 04/11/2023   K 4.5 04/11/2023   CO2 26 04/11/2023   GLUCOSE 97 04/11/2023   BUN 26 (H) 04/11/2023   CREATININE 2.00 (H) 04/11/2023   CALCIUM 9.7 04/11/2023   EGFR 54 (L) 03/01/2023   GFRNONAA 35 (L) 04/11/2023    Region 2 allergy panel in 02/2023 - negative, ige 9.    Immunization status: Immunization History  Administered Date(s) Administered   Fluad Quad(high Dose 65+) 03/19/2022   Fluad Trivalent(High Dose 65+) 02/17/2023   Influenza Split 04/08/2012   Influenza, High Dose Seasonal PF 03/04/2017, 04/14/2021   Influenza,inj,Quad PF,6+ Mos 05/16/2015, 02/15/2018   Moderna Sars-Covid-2 Vaccination 07/15/2019, 08/12/2019   Pneumococcal Conjugate-13 07/25/2014   Pneumococcal Polysaccharide-23 04/08/2012    Pneumococcal-Unspecified 06/06/2002   Td 06/30/2011   Tdap 10/21/2019   Zoster, Live 05/22/2008    External Records Personally Reviewed: pulmonary  Assessment:  Eosinophilc asthma copd overlap syndrome, controlled GERD, controlled Chronic Allergic Rhinitis, controlled Chronic glucocorticoid use - tapering  Plan/Recommendations:   For rhinitis continue flonase, and astelin nasal spray  Continue the dupixent injections Continue breztri 2 puffs twice daily, take albuterol in between if you need something rather than extra breztri.  Use flutter valve after inhaler treatments to help cough up mucus.   Go down to 2.5 once every other day for the next month. Then you can stop the prednisone.    Return to Care: Return in about 4 months (around 09/10/2023).   Durel Salts, MD Pulmonary and Critical Care Medicine [facility] [place] Office:[phone]

## 2023-05-26 DIAGNOSIS — H1033 Unspecified acute conjunctivitis, bilateral: Secondary | ICD-10-CM | POA: Diagnosis not present

## 2023-05-27 NOTE — Progress Notes (Signed)
3 MONTH FOLLOW UP  Assessment:    Essential hypertension Currently controlled with Olmesartan/HCT 20/12.5 mg every day  Monitor blood pressure at home; call if consistently over 130/80 Continue DASH diet.   Reminder to go to the ER if any CP, SOB, nausea, dizziness, severe HA, changes vision/speech, left arm numbness and tingling and jaw pain. 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 Continue to control BP, Cholesterol and follow low saturated fat diet, monitor  Abnormal glucose Discussed dietary and exercise modifications A1c   Vitamin D deficiency Continue supplementation Taking Vitamin D 5,000 IU daily, two tablets daily At goal defer lab today  Stage 3a chronic kidney disease Increase fluids  Avoid NSAIDS Blood pressure control Monitor sugars  Will continue to monitor - CBC, CMP  Gastroesophageal reflux disease without esophagitis Doing well at this time Continue: prilosec 40mg  daily Diet discussed Monitor for triggers Avoid food with high acid content Avoid excessive cafeine Increase water intake  S/P unilateral BKA (below knee amputation), right (HCC) Doing well at this time  Stage 3 severe COPD(HCC) Continue inhalers as needed and monitor Continue Dupixent 300 mg q 14 days Continue Prednisone 2.5 mg every other day for this month Follow with Dr. Celine Mans pulmonology  BPH with obstruction/lower urinary tract symptoms Doing well at this time Continue to monitor  Malignant Melanoma(HCC) Continue to follow with dermatology, Dr. 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  Future Appointments  Date Time Provider Department Center  08/12/2023  3:45 PM Windell Norfolk, MD [facility] None  08/30/2023 10:00 AM Lucky Cowboy, MD [facility] None  12/06/2023 10:30 AM Raynelle Dick, NP [facility] None       Subjective:  Leslie Cardenas is a 82 y.o. male who presents for 3 month follow up for HTN,HLD, prediabetes, CKDIII, COPD GERD and vitamin D Def.   He was hospitalized for TIA 06/10/2022- follows with neurology.  Currently on Plavix, ASA, Atorvastatin 80 mg and Zetia 10 mg. Continues to have memory difficulties.  He did have sinus congestion of green/yellow/brown mucus last week.  Productive cough of yellow mucus- does have severe COPD. Negative covid  He is following with Dr. Celine Mans, pulmonologist for Class 3 severe COPD.  Currently on Breztri daily and Albuterol PRN. Is also to do duoneb treatments BID and does not do them unless wife is home from work to encourage. He does take Prednisone 2.5 mg once a day- is to go down to every other day for a month and then stop Prednisone. Does  continue to have dyspnea on exertion. No oxygen use yet. Continues to have daily productive cough.Last visit with Dr Celine Mans was 05/12/23.  Quit smoking 04/2000  BMI is Body mass index is 21.62 kg/m., he has been working on diet and exercise. He has been eating better.  Wt Readings from Last 3 Encounters:  05/31/23 142 lb 3.2 oz (64.5 kg)  05/12/23 140 lb (63.5 kg)   04/11/23 140 lb (63.5 kg)    His blood pressure has been controlled at home on Olmesartan/hydrochlorothiazide 20/12.5 mg every day , today their BP is BP: 116/68  BP Readings from Last 3 Encounters:  05/31/23 116/68  05/12/23 118/72  04/12/23 121/73   He does have shortness of breath on exertion or when he has long coughing spell . He denies chest pain, dizziness.   He is on cholesterol medication Zetia 10mg , atorvastatin 80 mg .His cholesterol is at goal. The cholesterol last visit was:   Lab Results  Component Value Date   CHOL 107 03/01/2023   HDL 52 03/01/2023   LDLCALC 35 03/01/2023   TRIG 113 03/01/2023   CHOLHDL 2.1 03/01/2023     He had 3 areas of melanoma on each ear and right cheek which have been removed.  Continues to follow with dermatology.  He has been working on diet and exercise for prediabetes, and denies hyperglycemia, hypoglycemia , increased appetite, nausea, polydipsia and polyuria. Last A1C in the office was:  Lab Results  Component Value Date   HGBA1C 6.3 (H) 03/01/2023    He is try to drink more fluids throughout the day.  Last GFR Lab Results  Component Value Date   EGFR 54 (L) 03/01/2023    Patient is on Vitamin D supplement.   Lab Results  Component Value Date   VD25OH 99 03/01/2023          Medication Review:  Current Outpatient Medications (Endocrine & Metabolic):    predniSONE (DELTASONE) 5 MG tablet, TAKE 1 TABLET DAILY AS DIRECTED FOR COPD (Patient taking differently: Take 2.5 mg by mouth daily. One half tablet until completion of current bottle)  Current Outpatient Medications (Cardiovascular):    atorvastatin (LIPITOR) 80 MG tablet, TAKE 1 TABLET BY MOUTH EVERY DAY   ezetimibe (ZETIA) 10 MG tablet, TAKE 1 TABLET BY MOUTH EVERY DAY FOR CHOLESTEROL   nitroGLYCERIN (NITROSTAT) 0.4 MG SL tablet, Place 0.4 mg under the tongue every 5 (five) minutes as needed for chest pain.   olmesartan-hydrochlorothiazide (BENICAR HCT) 20-12.5 MG  tablet, Take   1 tablet  Daily   for BP                                                     /                                                                   TAKE  BY                                                 MOUTH  Current Outpatient Medications (Respiratory):    albuterol (VENTOLIN HFA) 108 (90 Base) MCG/ACT inhaler, Inhale 2 puffs into the lungs every 4 (four) hours as needed for wheezing or shortness of breath. Make sure you rinse mouth out after each use.   azelastine (ASTELIN) 0.1 % nasal spray, Place 1 spray into both nostrils 2 (two) times daily. Use in each nostril as directed   Budeson-Glycopyrrol-Formoterol (BREZTRI AEROSPHERE) 160-9-4.8 MCG/ACT AERO, Inhale 2 puffs into the lungs in the morning and at bedtime.   fexofenadine (ALLEGRA) 180 MG tablet, Take 1 tablet by mouth daily.   fluticasone (FLONASE) 50 MCG/ACT nasal spray, Place 1 spray into both nostrils daily.   ipratropium-albuterol (DUONEB) 0.5-2.5 (3) MG/3ML SOLN, Take 3 mLs by nebulization every 6 (six) hours as needed. Max:4 doses per day   loratadine (CLARITIN) 10 MG tablet, TAKE ONE PILL ONCE DAILY AS NEEDED FOR ITCHING (Patient taking differently: Take 10 mg by mouth as needed for allergies.)   promethazine-dextromethorphan (PROMETHAZINE-DM) 6.25-15 MG/5ML syrup, Take 5 mLs by mouth 4 (four) times daily as needed for cough.  Current Outpatient Medications (Analgesics):    acetaminophen (TYLENOL) 500 MG tablet, Take 1,000 mg by mouth every 6 (six) hours as needed for mild pain (pain score 1-3) or moderate pain (pain score 4-6). Patient only uses when needed.   ASPIRIN LOW DOSE 81 MG tablet, Take 81 mg by mouth daily.   Aspirin-Caffeine 500-32.5 MG TABS, Take 2 tablets by mouth as needed (Back pain).   IBUPROFEN PO, Take by mouth. PRN   naproxen sodium (ALEVE) 220 MG tablet, Take 660 mg by mouth daily as needed (Only takes when needed).  Current Outpatient Medications  (Hematological):    clopidogrel (PLAVIX) 75 MG tablet, Take 1 tablet (75 mg total) by mouth daily.  Current Outpatient Medications (Other):    neomycin-polymyxin b-dexamethasone (MAXITROL) 3.5-10000-0.1 SUSP, INSTILL 1 DROP INTO BOTH EYES FOUR TIMES DAILY FOR 1 WEEK THEN TWICE DAILY FOR SECOND WEEK   acetic acid-hydrocortisone (VOSOL-HC) OTIC solution, Instill 3 to 4 drops to the affected ear 2 to 3 x /day   busPIRone (BUSPAR) 5 MG tablet, TAKE 1/2 TO 1 TABLET 3 TIME A DAY FOR ANXIETY   Dupilumab (DUPIXENT) 300 MG/2ML SOAJ, Inject 300 mg into the skin every 14 (fourteen) days. **loading dose received in clinic on 12/23/22   Magnesium Oxide -Mg Supplement 500 MG TABS, Take 1 tablet by mouth daily.   Multiple Vitamin (MULTIVITAMIN) tablet, Take 1 tablet by mouth daily.   nystatin (MYCOSTATIN) 100000 UNIT/ML suspension, Swish 5 mL in the mouth and retain for as long as possible (several minutes) before swallowing.   pantoprazole (PROTONIX) 40 MG tablet, Take 1 tablet (40 mg total) by mouth daily.   VITAMIN D PO, Take 5,000 Units by mouth daily. Taking 10,000 units daily  Allergies: No Known Allergies  Current Problems (verified) has Hyperlipidemia, mixed; COPD (chronic obstructive pulmonary disease) (HCC); Sinus bradycardia; History of colonic polyps; Essential hypertension; Prediabetes; Vitamin D deficiency; Hx of right BKA (HCC); ASHD hx/o MI; GERD; Subacute osteomyelitis of right tibia (HCC); S/P unilateral BKA (below knee amputation), right (HCC); Nephrolithiasis; Hydronephrosis; FH: hypertension; CKD (chronic kidney disease) stage 2, GFR 60-89 ml/min; Abnormal  glucose; Thoracic aortic atherosclerosis (HCC) by CXR on 07/29/2018; Blurred vision; and TIA (transient ischemic attack) on their problem list.    Names of Other Physician/Practitioners you currently use: 1. [facility] here for primary care 2.[facility] [place] [facility]24 3. Dentist, has dentures.  Fit  well, no issues.  Patient Care Team: Lucky Cowboy, MD as PCP - General (Internal Medicine) Maris Berger, MD as Consulting Physician (Ophthalmology) Iva Boop, MD as Consulting Physician (Gastroenterology) Kalman Shan, MD as Consulting Physician (Pulmonary Disease) Sundra Aland, [facility] (Inactive) as Pharmacist (Pharmacist)  Surgical: He  has a past surgical history that includes right leg amputation (05/14/1977); Cardiac catheterization (4098); Colonoscopy w/ polypectomy (08/12/2010); Inguinal hernia repair; Esophagogastroduodenoscopy; Shoulder arthroscopy (Left, 2000); Cataract extraction, bilateral; Colonoscopy; Cystoscopy w/ ureteral stent placement (Right, 03/04/2018); UPPER GASTROINTESTINAL ENDOSCOPY  WITH DILATION (02/24/2018); Colonoscopy w/ polypectomy (07/16/2016); and Cystoscopy/ureteroscopy/holmium laser/stent placement (Right, 03/16/2018). Family His family history includes Colon cancer in his brother; Hypertension in his father; Leukemia in his brother; Other in his brother; Pancreatic cancer in his mother; Prostate cancer in his brother. Social history  He reports that he quit smoking about 23 years ago. His smoking use included cigarettes. He started smoking about 43 years ago. He has a 60 pack-year smoking history. He has never used smokeless tobacco. He reports that he does not currently use alcohol. He reports that he does not use drugs.  Review of Systems  Constitutional:  Negative for chills, fever and weight loss.  HENT:  Negative for congestion and hearing loss.   Eyes:  Negative for blurred vision and double vision.  Respiratory:  Positive for cough, sputum production (yellow), shortness of breath (with exertion) and wheezing.   Cardiovascular:  Negative for chest pain, palpitations, orthopnea and leg swelling.  Gastrointestinal:  Negative for abdominal pain, constipation, diarrhea, heartburn, nausea and vomiting.  Musculoskeletal:  Positive for back pain  and joint pain (hips). Negative for falls and myalgias.  Skin:  Negative for rash.  Neurological:  Negative for dizziness, tingling, tremors, loss of consciousness and headaches.  Psychiatric/Behavioral:  Negative for depression, memory loss and suicidal ideas.      Objective:   Today's Vitals   05/31/23 1035  BP: 116/68  Pulse: 70  Temp: 97.7 F (36.5 C)  SpO2: 97%  Weight: 142 lb 3.2 oz (64.5 kg)  Height: 5\' 8"  (1.727 m)      Body mass index is 21.62 kg/m.  General appearance: alert, no distress, WD/WN, male HEENT: normocephalic, sclerae anicteric, TMs pearly, nares patent, no discharge or erythema, pharynx normal. Lid lag of right upper lid and left lower lid Oral cavity: MMM, no lesions Neck: supple, no lymphadenopathy, no thyromegaly, no masses Heart: RRR, normal S1, S2, no murmurs Lungs: breath sounds diminished throughout with wheezing bilateral upper lobes. Abdomen: +bs, soft, non tender, non distended, no masses, no hepatomegaly, no splenomegaly Musculoskeletal: nontender, no swelling, no obvious deformity Skin: warm and dry Extremities: no edema, no cyanosis, no clubbing. R BK prosthetic no skin breakdown of R BKA area Pulses: 2+ symmetric, upper and left lower extremities, normal cap refill Neurological: alert, oriented x 3, CN2-12 intact, strength normal upper extremities and lower extremities, sensation normal throughout, DTRs 2+ throughout, no cerebellar signs, gait normal Psychiatric: normal affect, behavior normal, pleasant 3/3 word recall     [facility]  05/31/2023

## 2023-05-31 ENCOUNTER — Ambulatory Visit (INDEPENDENT_AMBULATORY_CARE_PROVIDER_SITE_OTHER): Payer: Medicare PPO | Admitting: Nurse Practitioner

## 2023-05-31 ENCOUNTER — Encounter: Payer: Self-pay | Admitting: Nurse Practitioner

## 2023-05-31 VITALS — BP 116/68 | HR 70 | Temp 97.7°F | Ht 68.0 in | Wt 142.2 lb

## 2023-05-31 DIAGNOSIS — I700 Atherosclerosis of aorta: Secondary | ICD-10-CM

## 2023-05-31 DIAGNOSIS — J449 Chronic obstructive pulmonary disease, unspecified: Secondary | ICD-10-CM

## 2023-05-31 DIAGNOSIS — Z6821 Body mass index (BMI) 21.0-21.9, adult: Secondary | ICD-10-CM

## 2023-05-31 DIAGNOSIS — Z6820 Body mass index (BMI) 20.0-20.9, adult: Secondary | ICD-10-CM

## 2023-05-31 DIAGNOSIS — E782 Mixed hyperlipidemia: Secondary | ICD-10-CM

## 2023-05-31 DIAGNOSIS — I10 Essential (primary) hypertension: Secondary | ICD-10-CM | POA: Diagnosis not present

## 2023-05-31 DIAGNOSIS — Z89511 Acquired absence of right leg below knee: Secondary | ICD-10-CM

## 2023-05-31 DIAGNOSIS — R413 Other amnesia: Secondary | ICD-10-CM

## 2023-05-31 DIAGNOSIS — R7309 Other abnormal glucose: Secondary | ICD-10-CM

## 2023-05-31 DIAGNOSIS — Z79899 Other long term (current) drug therapy: Secondary | ICD-10-CM

## 2023-05-31 DIAGNOSIS — G720 Drug-induced myopathy: Secondary | ICD-10-CM | POA: Diagnosis not present

## 2023-05-31 DIAGNOSIS — E559 Vitamin D deficiency, unspecified: Secondary | ICD-10-CM

## 2023-05-31 DIAGNOSIS — N1831 Chronic kidney disease, stage 3a: Secondary | ICD-10-CM | POA: Diagnosis not present

## 2023-05-31 DIAGNOSIS — Z1152 Encounter for screening for COVID-19: Secondary | ICD-10-CM | POA: Diagnosis not present

## 2023-05-31 DIAGNOSIS — C439 Malignant melanoma of skin, unspecified: Secondary | ICD-10-CM

## 2023-05-31 LAB — POC COVID19 BINAXNOW: SARS Coronavirus 2 Ag: NEGATIVE

## 2023-05-31 NOTE — Patient Instructions (Signed)

## 2023-06-01 ENCOUNTER — Other Ambulatory Visit: Payer: Self-pay | Admitting: Nurse Practitioner

## 2023-06-01 LAB — COMPLETE METABOLIC PANEL WITH GFR
AG Ratio: 1.3 (calc) (ref 1.0–2.5)
ALT: 12 U/L (ref 9–46)
AST: 17 U/L (ref 10–35)
Albumin: 3.6 g/dL (ref 3.6–5.1)
Alkaline phosphatase (APISO): 73 U/L (ref 35–144)
BUN/Creatinine Ratio: 10 (calc) (ref 6–22)
BUN: 14 mg/dL (ref 7–25)
CO2: 31 mmol/L (ref 20–32)
Calcium: 9.7 mg/dL (ref 8.6–10.3)
Chloride: 104 mmol/L (ref 98–110)
Creat: 1.41 mg/dL — ABNORMAL HIGH (ref 0.70–1.22)
Globulin: 2.8 g/dL (ref 1.9–3.7)
Glucose, Bld: 85 mg/dL (ref 65–99)
Potassium: 5.2 mmol/L (ref 3.5–5.3)
Sodium: 140 mmol/L (ref 135–146)
Total Bilirubin: 0.4 mg/dL (ref 0.2–1.2)
Total Protein: 6.4 g/dL (ref 6.1–8.1)
eGFR: 49 mL/min/{1.73_m2} — ABNORMAL LOW (ref >=60)

## 2023-06-01 LAB — LIPID PANEL
Cholesterol: 138 mg/dL (ref <200)
HDL: 39 mg/dL — ABNORMAL LOW (ref >=40)
LDL Cholesterol (Calc): 74 mg/dL
Non-HDL Cholesterol (Calc): 99 mg/dL (ref <130)
Total CHOL/HDL Ratio: 3.5 (calc) (ref <5.0)
Triglycerides: 175 mg/dL — ABNORMAL HIGH (ref <150)

## 2023-06-01 LAB — CBC WITH DIFFERENTIAL/PLATELET
Absolute Lymphocytes: 855 {cells}/uL (ref 850–3900)
Absolute Monocytes: 946 {cells}/uL (ref 200–950)
Basophils Absolute: 109 {cells}/uL (ref 0–200)
Basophils Relative: 1.2 %
Eosinophils Absolute: 282 {cells}/uL (ref 15–500)
Eosinophils Relative: 3.1 %
HCT: 37.1 % — ABNORMAL LOW (ref 38.5–50.0)
Hemoglobin: 12.3 g/dL — ABNORMAL LOW (ref 13.2–17.1)
MCH: 32.1 pg (ref 27.0–33.0)
MCHC: 33.2 g/dL (ref 32.0–36.0)
MCV: 96.9 fL (ref 80.0–100.0)
MPV: 9 fL (ref 7.5–12.5)
Monocytes Relative: 10.4 %
Neutro Abs: 6907 {cells}/uL (ref 1500–7800)
Neutrophils Relative %: 75.9 %
Platelets: 350 10*3/uL (ref 140–400)
RBC: 3.83 10*6/uL — ABNORMAL LOW (ref 4.20–5.80)
RDW: 13.5 % (ref 11.0–15.0)
Total Lymphocyte: 9.4 %
WBC: 9.1 10*3/uL (ref 3.8–10.8)

## 2023-06-01 LAB — MAGNESIUM: Magnesium: 2 mg/dL (ref 1.5–2.5)

## 2023-06-10 DIAGNOSIS — H04123 Dry eye syndrome of bilateral lacrimal glands: Secondary | ICD-10-CM | POA: Diagnosis not present

## 2023-06-10 DIAGNOSIS — H10503 Unspecified blepharoconjunctivitis, bilateral: Secondary | ICD-10-CM | POA: Diagnosis not present

## 2023-08-05 ENCOUNTER — Encounter: Payer: Medicare PPO | Admitting: Internal Medicine

## 2023-08-12 ENCOUNTER — Encounter: Payer: Self-pay | Admitting: Neurology

## 2023-08-12 ENCOUNTER — Ambulatory Visit: Payer: Medicare HMO | Admitting: Neurology

## 2023-08-12 VITALS — BP 153/82 | HR 77 | Ht 69.0 in | Wt 144.0 lb

## 2023-08-12 DIAGNOSIS — G459 Transient cerebral ischemic attack, unspecified: Secondary | ICD-10-CM | POA: Diagnosis not present

## 2023-08-12 NOTE — Progress Notes (Signed)
 [facility]  PATIENT: Leslie Cardenas DOB: 01/10/1941  REQUESTING CLINICIAN: Lucky Cowboy, MD HISTORY FROM: Patient, ex-wife and chart review  REASON FOR VISIT: TIA    HISTORICAL  CHIEF COMPLAINT:  Chief Complaint  Patient presents with   Room 12    Pt is here with his ex-wife. Pt states that he has been doing wonderful since his last appointment.    INTERVAL HISTORY 08/12/2023:  Patient presents today for follow-up, he is accompanied by wife.  Last visit was a year ago, since then he has been doing well.  Denies any new symptom concerning for TIA.  He is currently on Plavix daily. Unfortunately Dr. Oneta Rack passed away, he needs a new PCP. No other concerns.    HISTORY OF PRESENT ILLNESS:  This is a 83 year old gentleman past medical history of COPD, coronary artery disease, hypertension, hyperlipidemia, grade 1 diastolic dysfunction, right BKA who is presenting after being admitted to the hospital for TIA.  Patient reports waking up with left facial numbness and left hand numbness, at that time he also reported he was feeling tired and went to lay down.  Symptoms lasted about an hour, and a half. When son found him in the house, he convinced patient to go to the ED.  In the ED patient was admitted for TIA workup, MRI negative for any acute stroke and CT angiogram did not show any large vessel occlusion but did show narrowing in the small intracranial arteries.  He was recommended DAPT, aspirin and Plavix for total of 3 months and to continue with Plavix alone.  Since discharge from the hospital he has not had any additional symptoms.  He reports that he is doing much better, he is compliant with his medication, joined the gym and start working out.  He has not had any recurrent symptoms.  He also increase his hydration.  Currently no concerns.   Hospital Summary  This 83 years old male with PMH significant for COPD, coronary artery disease, GERD, grade 1 diastolic  dysfunction, hyperlipidemia presented to the ED with chief complaints of left-sided numbness. Patient reports his symptoms lasted about 90 minutes,  they are completely resolved by the time patient arrived in the ED.  Patient was evaluated by teleneurology recommended stroke workup.  MRI negative for acute stroke. LDL 143,  Started on Lipitor 80 mg daily,  target LDL goal below 70.  Echocardiogram is unremarkable.  Continued on aspirin and Lipitor.  Neurology is consulted.  CTA shows moderate to severe focal stenosis in the distal petrous/proximal cavernous segment of left MCA which has progressed compared to 2022.  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 Neurology has reviewed the imaging recommended patient can be discharged on aspirin and Plavix for 3 months followed by Plavix only therapy.  Patient should follow-up with neurology in 3 months.  Event monitor was prescribed at the time of discharge.  Patient is being discharged home.  His symptoms has resolved.   OTHER MEDICAL CONDITIONS: COPD, Hypertension, Hyperlipidemia, CAD< GERD, Grade 1 diastolic dysfunction, Hyperlipidemia    REVIEW OF SYSTEMS: Full 14 system review of systems performed and negative with exception of: As noted in the HPI   ALLERGIES: No Known Allergies  HOME MEDICATIONS: Outpatient Medications Prior to Visit  Medication Sig Dispense Refill   acetaminophen (TYLENOL) 500 MG tablet Take 1,000 mg by mouth every 6 (six) hours as needed for mild pain (pain score 1-3) or  moderate pain (pain score 4-6). Patient only uses when needed.     acetic acid-hydrocortisone (VOSOL-HC) OTIC solution Instill 3 to 4 drops to the affected ear 2 to 3 x /day 10 mL 1   albuterol (VENTOLIN HFA) 108 (90 Base) MCG/ACT inhaler Inhale 2 puffs into the lungs every 4 (four) hours as needed for wheezing or shortness of breath. Make sure you rinse mouth out after each use. 18 g 2   atorvastatin (LIPITOR)  80 MG tablet TAKE 1 TABLET BY MOUTH EVERY DAY 90 tablet 1   azelastine (ASTELIN) 0.1 % nasal spray Place 1 spray into both nostrils 2 (two) times daily. Use in each nostril as directed 30 mL 12   Budeson-Glycopyrrol-Formoterol (BREZTRI AEROSPHERE) 160-9-4.8 MCG/ACT AERO Inhale 2 puffs into the lungs in the morning and at bedtime. 1 each 11   busPIRone (BUSPAR) 5 MG tablet TAKE 1/2 TO 1 TABLET 3 TIME A DAY FOR ANXIETY 270 tablet 0   clopidogrel (PLAVIX) 75 MG tablet Take 1 tablet (75 mg total) by mouth daily. 30 tablet 11   Dupilumab (DUPIXENT) 300 MG/2ML SOAJ Inject 300 mg into the skin every 14 (fourteen) days. **loading dose received in clinic on 12/23/22 12 mL 1   ezetimibe (ZETIA) 10 MG tablet TAKE 1 TABLET BY MOUTH EVERY DAY FOR CHOLESTEROL 90 tablet 3   fexofenadine (ALLEGRA) 180 MG tablet Take 1 tablet by mouth daily.     fluticasone (FLONASE) 50 MCG/ACT nasal spray Place 1 spray into both nostrils daily.     IBUPROFEN PO Take by mouth. PRN     ipratropium-albuterol (DUONEB) 0.5-2.5 (3) MG/3ML SOLN Take 3 mLs by nebulization every 6 (six) hours as needed. Max:4 doses per day 1080 each 1   loratadine (CLARITIN) 10 MG tablet TAKE ONE PILL ONCE DAILY AS NEEDED FOR ITCHING (Patient taking differently: Take 10 mg by mouth as needed for allergies.) 90 tablet 3   Magnesium Oxide -Mg Supplement 500 MG TABS Take 1 tablet by mouth daily.     Multiple Vitamin (MULTIVITAMIN) tablet Take 1 tablet by mouth daily.     naproxen sodium (ALEVE) 220 MG tablet Take 660 mg by mouth daily as needed (Only takes when needed).     neomycin-polymyxin b-dexamethasone (MAXITROL) 3.5-10000-0.1 SUSP INSTILL 1 DROP INTO BOTH EYES FOUR TIMES DAILY FOR 1 WEEK THEN TWICE DAILY FOR SECOND WEEK     nystatin (MYCOSTATIN) 100000 UNIT/ML suspension Swish 5 mL in the mouth and retain for as long as possible (several minutes) before swallowing. 60 mL 0   olmesartan-hydrochlorothiazide (BENICAR HCT) 20-12.5 MG tablet Take   1 tablet   Daily   for BP                                                     /                                                                   TAKE  BY                                                 MOUTH 90 tablet 3   pantoprazole (PROTONIX) 40 MG tablet Take 1 tablet (40 mg total) by mouth daily. 90 tablet 3   predniSONE (DELTASONE) 5 MG tablet TAKE 1 TABLET DAILY AS DIRECTED FOR COPD (Patient taking differently: Take 2.5 mg by mouth daily. One half tablet until completion of current bottle) 90 tablet 0   promethazine-dextromethorphan (PROMETHAZINE-DM) 6.25-15 MG/5ML syrup Take 5 mLs by mouth 4 (four) times daily as needed for cough. 240 mL 1   VITAMIN D PO Take 5,000 Units by mouth daily. Taking 10,000 units daily     nitroGLYCERIN (NITROSTAT) 0.4 MG SL tablet Place 0.4 mg under the tongue every 5 (five) minutes as needed for chest pain. (Patient not taking: Reported on 08/12/2023)     No facility-administered medications prior to visit.    PAST MEDICAL HISTORY: 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   Coronary artery disease    Diverticulosis 07/16/2016   SIGMOID, NOTED ON COLONOSCOPY   Esophageal stenosis 02/24/2018   noted on endoscopy   Esophageal stricture    Former tobacco use    GERD (gastroesophageal reflux disease)    Grade I diastolic dysfunction 08/09/2017   noted on ECHO   History of hiatal hernia 02/24/2018   Small noted on endoscopy   Hydronephrosis    Hydronephrosis of right kidney 03/04/2018   URETERAL STONE 7 MM, NOTED ONJ CT ABD/PELVIS   Hyperlipidemia    Myocardial infarction [facility])    [id number]   Nephrolithiasis    s/p stent placement   Skin cancer    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 NOTED  ON CT ABD/PELVIS   Vitamin D deficiency    Wears dentures    upper    PAST SURGICAL HISTORY: Past Surgical History:  Procedure Laterality Date   CARDIAC CATHETERIZATION  1983   No intervention performed.    CATARACT EXTRACTION, BILATERAL     COLONOSCOPY     COLONOSCOPY W/ POLYPECTOMY  08/12/2010   Dr. Stan Head   COLONOSCOPY W/ POLYPECTOMY  07/16/2016   CYSTOSCOPY W/ URETERAL STENT PLACEMENT Right 03/04/2018   Procedure: CYSTOSCOPY WITH RETROGRADE PYELOGRAM/URETERAL STENT PLACEMENT;  Surgeon: Crista Elliot, MD;  Location[facility] OR;  Service: Urology;  Laterality: Right;   CYSTOSCOPY/URETEROSCOPY/HOLMIUM LASER/STENT PLACEMENT Right 03/16/2018   Procedure: CYSTOSCOPY RIGHT URETEROSCOPY/HOLMIUM LASER/STENT PLACEMENT;  Surgeon: Crista Elliot, MD;  Location: [facility];  Service: Urology;  Laterality: Right;   ESOPHAGOGASTRODUODENOSCOPY     INGUINAL HERNIA REPAIR     left   right leg amputation  05/14/1977   SHOULDER ARTHROSCOPY Left 2000   UPPER GASTROINTESTINAL ENDOSCOPY  WITH DILATION  02/24/2018    FAMILY HISTORY: Family History  Problem Relation Age of Onset   Pancreatic cancer Mother    Hypertension Father    Colon cancer Brother    Leukemia Brother    Prostate cancer Brother  Other Brother        polio   Esophageal cancer Neg Hx    Rectal cancer Neg Hx    Stomach cancer Neg Hx     SOCIAL HISTORY: Social History   Socioeconomic History   Marital status: Divorced    Spouse name: Not on file   Number of children: 4   Years of education: Not on file   Highest education level: 11th grade  Occupational History   Occupation: retired    Associate Professor: RETIRED  Tobacco Use   Smoking status: Former    Current packs/day: 0.00    Average packs/day: 3.0 packs/day for 20.0 years (60.0 ttl pk-yrs)    Types: Cigarettes    Start date: 04/11/1980    Quit date: 04/11/2000    Years since quitting: 23.3   Smokeless tobacco: Never  Vaping Use   Vaping  status: Never Used  Substance and Sexual Activity   Alcohol use: Not Currently    Comment: rare   Drug use: No   Sexual activity: Not Currently  Other Topics Concern   Not on file  Social History Narrative   [employer]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    Social Drivers of Corporate investment banker Strain: Not on file  Food Insecurity: No Food Insecurity (10/21/2022)   Hunger Vital Sign    Worried About Running Out of Food in the Last Year: Never true    Ran Out of Food in the Last Year: Never true  Transportation Needs: No Transportation Needs (10/21/2022)   PRAPARE - Administrator, Civil Service (Medical): No    Lack of Transportation (Non-Medical): No  Physical Activity: Not on file  Stress: Not on file  Social Connections: Not on file  Intimate Partner Violence: Not At Risk (10/21/2022)   Humiliation, Afraid, Rape, and Kick questionnaire    Fear of Current or Ex-Partner: No    Emotionally Abused: No    Physically Abused: No    Sexually Abused: No    PHYSICAL EXAM  GENERAL EXAM/CONSTITUTIONAL: Vitals:  Vitals:   08/12/23 1549  BP: (!) 153/82  Pulse: 77  Weight: 144 lb (65.3 kg)  Height: 5\' 9"  (1.753 m)    Body mass index is 21.27 kg/m. Wt Readings from Last 3 Encounters:  08/12/23 144 lb (65.3 kg)  05/31/23 142 lb 3.2 oz (64.5 kg)  05/12/23 140 lb (63.5 kg)   Patient is in no distress; well developed, nourished and groomed; neck is supple  MUSCULOSKELETAL: Gait, strength, tone, movements noted in Neurologic exam below  NEUROLOGIC: MENTAL STATUS:     11/26/2022   10:00 AM 08/29/2019    3:50 PM  MMSE - Mini Mental State Exam  Orientation to time 5 5  Orientation to Place 5 5  Registration 1 3  Attention/ Calculation 0 5  Recall 2 3  Language- name 2 objects 2 2  Language- repeat 1 1  Language- follow 3 step command  3 3  Language- read & follow direction 1 1  Write a sentence 1 1  Copy design 0 1  Total score 21 30   awake, alert, oriented to person, place and time recent and remote memory intact normal attention and concentration language fluent, comprehension intact, naming intact fund of knowledge appropriate  CRANIAL NERVE:  2nd, 3rd, 4th, 6th - Visual fields full to confrontation, extraocular muscles intact, no nystagmus 5th - facial sensation symmetric 7th - facial strength symmetric 8th - hearing intact 9th - palate elevates symmetrically, uvula midline 11th - shoulder shrug symmetric 12th - tongue protrusion midline  MOTOR:  normal bulk and tone, full strength in the BUE, BLE. He does have a right BKA  SENSORY:  normal and symmetric to light touch  COORDINATION:  finger-nose-finger, fine finger movements normal  GAIT/STATION:  normal   DIAGNOSTIC DATA (LABS, IMAGING, TESTING) - I reviewed patient records, labs, notes, testing and imaging myself where available.  Lab Results  Component Value Date   WBC 9.1 05/31/2023   HGB 12.3 (L) 05/31/2023   HCT 37.1 (L) 05/31/2023   MCV 96.9 05/31/2023   PLT 350 05/31/2023      Component Value Date/Time   NA 140 05/31/2023 1116   K 5.2 05/31/2023 1116   CL 104 05/31/2023 1116   CO2 31 05/31/2023 1116   GLUCOSE 85 05/31/2023 1116   BUN 14 05/31/2023 1116   CREATININE 1.41 (H) 05/31/2023 1116   CALCIUM 9.7 05/31/2023 1116   PROT 6.4 05/31/2023 1116   ALBUMIN 3.4 (L) 04/11/2023 1955   AST 17 05/31/2023 1116   ALT 12 05/31/2023 1116   ALKPHOS 69 04/11/2023 1955   BILITOT 0.4 05/31/2023 1116   GFRNONAA 35 (L) 04/11/2023 1955   GFRNONAA 62 12/05/2020 1118   GFRAA 72 12/05/2020 1118   Lab Results  Component Value Date   CHOL 138 05/31/2023   HDL 39 (L) 05/31/2023   LDLCALC 74 05/31/2023   TRIG 175 (H) 05/31/2023   CHOLHDL 3.5 05/31/2023   Lab Results  Component Value Date   HGBA1C 6.3 (H) 03/01/2023   Lab Results   Component Value Date   VITAMINB12 405 07/29/2018   Lab Results  Component Value Date   TSH 1.55 03/01/2023    MRI Brain 06/11/2022 No acute intracranial abnormality.   Noncontrast CT head: 1. No hemorrhage or CT evidence of an acute infarct.   CT Angiogram Anterior circulation: 1. Moderate to severe focal stenosis in the distal petrous/proximal cavernous segment of the left MCA, which has progressed compared to 2022. 2. Moderate narrowing in the distal M1 segment of the left MCA in the inferior division of the left MCA, and at least mild narrowing at the M1 M2 junction of the right MCA.   Posterior circulation 1. Moderate stenosis in the proximal to mid segment of the basilar artery, new from prior exam. 2. Moderate to severe focal stenosis of the P2 segment of the left PCA, new from prior exam. 3. V1 segment of the right vertebral artery is poorly visualized due to streak artifact from venous contrast material, but there may be at least moderate stenosis in the proximal V1 segment.  Echocardiogram  1. Left ventricular ejection fraction, by estimation, is 60 to 65%. The left ventricle has normal function. The left ventricle has no regional wall motion abnormalities. There is mild left ventricular hypertrophy. Left ventricular diastolic parameters were normal.  2. Right ventricular systolic function is normal. The right ventricular size is normal. Tricuspid regurgitation signal is inadequate for assessing PA pressure.  3. The mitral valve is grossly normal. No evidence of mitral valve regurgitation.  4. The aortic valve is tricuspid. Aortic valve regurgitation is mild. Aortic valve sclerosis/calcification is present, without any evidence of aortic stenosis.  5. The inferior vena cava is normal in  size with greater than 50% respiratory variability, suggesting right atrial pressure of 3 mmHg.    ASSESSMENT AND PLAN  83 y.o. year old male with vascular risk factors including  hypertension, hyperlipidemia, CAD, who is presenting for follow-up for his TIA.  He is doing well, denies any new symptoms concerning for TIA.  He is currently on Lipitor, Zetia and Plavix, no other complaint no other concerns.   1. TIA (transient ischemic attack)     Patient Instructions  Continue current medications  Set up care with Dr. De Peru  Return as needed   No orders of the defined types were placed in this encounter.   No orders of the defined types were placed in this encounter.   Return if symptoms worsen or fail to improve.   Windell Norfolk, MD 08/12/2023, 4:11 PM  [facility] [address] [place], [place] [address] [phone]

## 2023-08-12 NOTE — Patient Instructions (Signed)
 Continue current medications  Set up care with Dr. De Peru  Return as needed

## 2023-08-24 ENCOUNTER — Telehealth: Payer: Self-pay | Admitting: Pharmacist

## 2023-08-24 DIAGNOSIS — J4489 Other specified chronic obstructive pulmonary disease: Secondary | ICD-10-CM

## 2023-08-24 MED ORDER — DUPIXENT 300 MG/2ML ~~LOC~~ SOAJ: 300.0000 mg | SUBCUTANEOUS | 0 refills | Status: DC

## 2023-08-24 NOTE — Telephone Encounter (Signed)
 Received a fax from  [employer] regarding an approval for DUPIXENT patient assistance from 08/21/2023 to 06/07/2024. Approval letter sent to scan center.  Phone #: [phone] Fax #: [phone] Reference # [id number]  Chesley Mires, PharmD, MPH, BCPS, CPP Clinical Pharmacist (Rheumatology and Pulmonology)

## 2023-08-30 ENCOUNTER — Encounter: Payer: Medicare PPO | Admitting: Internal Medicine

## 2023-09-27 ENCOUNTER — Ambulatory Visit: Admitting: Internal Medicine

## 2023-09-27 ENCOUNTER — Encounter: Payer: Self-pay | Admitting: Internal Medicine

## 2023-09-27 VITALS — BP 112/68 | HR 86 | Ht 68.0 in | Wt 138.8 lb

## 2023-09-27 DIAGNOSIS — J309 Allergic rhinitis, unspecified: Secondary | ICD-10-CM | POA: Diagnosis not present

## 2023-09-27 DIAGNOSIS — J4489 Other specified chronic obstructive pulmonary disease: Secondary | ICD-10-CM

## 2023-09-27 MED ORDER — BUDESONIDE 0.25 MG/2ML IN SUSP: 0.2500 mg | Freq: Every day | RESPIRATORY_TRACT | 12 refills | Status: DC

## 2023-09-27 MED ORDER — ARFORMOTEROL TARTRATE 15 MCG/2ML IN NEBU: 15.0000 ug | INHALATION_SOLUTION | Freq: Two times a day (BID) | RESPIRATORY_TRACT | 11 refills | Status: DC

## 2023-09-27 NOTE — Patient Instructions (Addendum)
 It was a pleasure to see you today!  Please schedule follow up with myself in 3 months.  If my schedule is not open yet, we will contact you with a reminder closer to that time. Please call [phone] if you haven't heard from us  a month before, and always call us  sooner if issues or concerns arise. You can also send us  a message through MyChart, but but aware that this is not to be used for urgent issues and it may take up to 5-7 days to receive a reply. Please be aware that you will likely be able to view your results before I have a chance to respond to them. Please give us  5 business days to respond to any non-urgent results.   YOUR PLAN:  -CHRONIC OBSTRUCTIVE PULMONARY DISEASE (COPD): COPD is a chronic lung condition that makes it hard to breathe. To help manage your symptoms, we will discontinue Breztri  and start you on nebulized Brovana  and Pulmicort . Use the pulmicort  first and the brovana  second. These medications should provide better relief. Additionally, we recommend you join a pulmonary rehabilitation program to improve your lung function and overall health. They will call you to schedule this. Using your flutter valve regularly, especially after nebulizer treatments, will help clear the mucus and reduce your cough.  -LONG-TERM (CURRENT) USE OF SYSTEMIC STEROIDS: Long-term use of prednisone  can have side effects. Since Dupixent  is helping reduce your need for steroids, we will taper your prednisone  to 2.5 mg every other day for two weeks and then stop it.   - CHRONIC ALLERGIC RHINITIS - continue flonase  and astelin .

## 2023-09-27 NOTE — Progress Notes (Signed)
 LYDELL MOGA    [id number]    Jan 23, 1941  Primary Care Physician:Patient, No Pcp Per Date of Appointment: 09/27/2023 Established Patient Visit  Chief complaint:   Chief Complaint  Patient presents with   Follow-up    Productive cough x 3-23mths     HPI: JEANETTE MOFFATT is a 83 y.o. man with COPD former tobacco use quit 2001, CAD, esophageal strictures. 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  History of Present Illness MATTHEWJAMES PETRASEK is an 83 year old male with COPD who presents with worsening shortness of breath.  He experiences significant shortness of breath, particularly during physical activity such as walking, while his breathing is manageable when sitting. He is currently using Breztri , two puffs in the morning and two at night, and is on 2.5 mg of prednisone  daily. He notes some improvement with albuterol , stating it has been more effective since resuming its use. However, he wants a stronger medication to manage his symptoms more effectively.  He experiences a persistent cough, particularly in the morning, and finds it difficult to clear his chest. He has a flutter valve but does not use it regularly, which could aid in clearing mucus.  He is also on Dupixent  injections every two weeks and reports no side effects from this medication. He is concerned about reducing prednisone  due to a past itching problem, but he has been tapering the dose slowly.  He lives in [place] and is able to drive himself.    Social history 60 pack years, quit 2001.  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  Lives at home with his ex-wife - lives in [place].  No childhood respiratory disease.   I have reviewed the patient's family social  and past medical history and updated as appropriate.   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   Coronary artery disease    Diverticulosis 07/16/2016   SIGMOID, NOTED ON COLONOSCOPY   Esophageal stenosis 02/24/2018   noted on endoscopy   Esophageal stricture    Former tobacco use    GERD (gastroesophageal reflux disease)    Grade I diastolic dysfunction 08/09/2017   noted on ECHO   History of hiatal hernia 02/24/2018   Small noted on endoscopy   Hydronephrosis    Hydronephrosis of right kidney 03/04/2018   URETERAL STONE 7 MM, NOTED ONJ CT ABD/PELVIS   Hyperlipidemia    Myocardial infarction [facility])    [id number]   Nephrolithiasis    s/p stent placement   Skin cancer    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 NOTED ON CT ABD/PELVIS   Vitamin D  deficiency    Wears dentures    upper    Past Surgical History:  Procedure Laterality Date   CARDIAC CATHETERIZATION  1983   No intervention performed.    CATARACT EXTRACTION, BILATERAL     COLONOSCOPY     COLONOSCOPY W/ POLYPECTOMY  08/12/2010   Dr. Loy Ruff  COLONOSCOPY W/ POLYPECTOMY  07/16/2016   CYSTOSCOPY W/ URETERAL STENT PLACEMENT Right 03/04/2018   Procedure: CYSTOSCOPY WITH RETROGRADE PYELOGRAM/URETERAL STENT PLACEMENT;  Surgeon: Samson Croak, MD;  [facility];  Service: Urology;  Laterality: Right;   CYSTOSCOPY/URETEROSCOPY/HOLMIUM LASER/STENT PLACEMENT Right 03/16/2018   Procedure: CYSTOSCOPY RIGHT URETEROSCOPY/HOLMIUM LASER/STENT PLACEMENT;  Surgeon: Samson Croak, MD;  [facility];  Service: Urology;  Laterality: Right;   ESOPHAGOGASTRODUODENOSCOPY     INGUINAL HERNIA REPAIR     left   right leg amputation  05/14/1977   SHOULDER ARTHROSCOPY Left  2000   UPPER GASTROINTESTINAL ENDOSCOPY  WITH DILATION  02/24/2018    Family History  Problem Relation Age of Onset   Pancreatic cancer Mother    Hypertension Father    Colon cancer Brother    Leukemia Brother    Prostate cancer Brother    Other Brother        polio   Esophageal cancer Neg Hx    Rectal cancer Neg Hx    Stomach cancer Neg Hx     Social History   Occupational History   Occupation: retired    Associate Professor: RETIRED  Tobacco Use   Smoking status: Former    Current packs/day: 0.00    Average packs/day: 3.0 packs/day for 20.0 years (60.0 ttl pk-yrs)    Types: Cigarettes    Start date: 04/11/1980    Quit date: 04/11/2000    Years since quitting: 23.4   Smokeless tobacco: Never  Vaping Use   Vaping status: Never Used  Substance and Sexual Activity   Alcohol  use: Not Currently    Comment: rare   Drug use: No   Sexual activity: Not Currently     Physical Exam: Blood pressure 112/68, pulse 86, height 5\' 8"  (1.727 m), weight 138 lb 12.8 oz (63 kg), SpO2 96%.  Gen:      No acute distress ENT:  no nasal polyps, mucus membranes moist, no nasal polyps Lungs:   diminished, no wheezes or crackles CV:         Regular rate and rhythm; no murmurs, rubs, or gallops.  No pedal edema   Data Reviewed: Imaging: I have personally reviewed the ct angio may 2024 shows moderate centrilobular emphysema  PFTs:      No data to display         I have personally reviewed the patient's PFTs and severe ariflow limitation + BD response.  ONO Jan 2023 - required oxygen    Labs:  Lab Results  Component Value Date   WBC 9.1 05/31/2023   HGB 12.3 (L) 05/31/2023   HCT 37.1 (L) 05/31/2023   MCV 96.9 05/31/2023   PLT 350 05/31/2023   Lab Results  Component Value Date   NA 140 05/31/2023   K 5.2 05/31/2023   CO2 31 05/31/2023   GLUCOSE 85 05/31/2023   BUN 14 05/31/2023   CREATININE 1.41 (H) 05/31/2023   CALCIUM  9.7 05/31/2023   EGFR 49 (L) 05/31/2023   GFRNONAA 35  (L) 04/11/2023    Region 2 allergy  panel in 02/2023 - negative, ige 9.    Immunization status: Immunization History  Administered Date(s) Administered   Fluad Quad(high Dose 65+) 03/19/2022   Fluad Trivalent(High Dose 65+) 02/17/2023   Influenza Split 04/08/2012   Influenza, High Dose Seasonal PF 03/04/2017, 04/14/2021   Influenza,inj,Quad PF,6+ Mos 05/16/2015, 02/15/2018   Moderna Sars-Covid-2 Vaccination 07/15/2019, 08/12/2019   Pneumococcal Conjugate-13 07/25/2014   Pneumococcal Polysaccharide-23 04/08/2012  Pneumococcal-Unspecified 06/06/2002   Td 06/30/2011   Tdap 10/21/2019   Zoster, Live 05/22/2008    External Records Personally Reviewed: pulmonary  Assessment:  Eosinophilc asthma copd overlap syndrome, controlled GERD, controlled Chronic Allergic Rhinitis, controlled Chronic glucocorticoid use - tapering  Assessment and Plan Assessment & Plan Asthma with Chronic Obstructive Pulmonary Disease (COPD) overlap, Severe FEV1 39% COPD with persistent exertional dyspnea. Current regimen includes Breztri , albuterol , and Dupixent . Discussed transition to nebulized therapy for enhanced delivery and efficacy. Pulmonary rehabilitation recommended. - Discontinue Breztri . - Initiate nebulized Brovana  and Pulmicort  nebs instead of breztri  - pulmonary rehab referral - Encourage regular use of the flutter valve, especially after nebulizer treatments, to aid in mucus clearance.  Long-term (current) use of systemic steroids On 2.5 mg prednisone  daily. Plan to taper due to potential side effects and limited efficacy. Dupixent  is reducing steroid dependency. Discussed concerns about withdrawal symptoms and previous pruritus. - Taper prednisone  to 2.5 mg every other day for two weeks, then discontinue. - Monitor for withdrawal symptoms and manage as needed.  - CHRONIC ALLERGIC RHINITIS - continue flonase  and astelin .   - history of tobacco use - outside window for lung cancer  screening based on age and quitting>15 years ago.   Return to Care: Return in about 3 months (around 12/27/2023).   Louie Rover, MD Pulmonary and Critical Care Medicine [facility] Office:[phone]

## 2023-09-29 ENCOUNTER — Ambulatory Visit (INDEPENDENT_AMBULATORY_CARE_PROVIDER_SITE_OTHER): Admitting: Family Medicine

## 2023-09-29 ENCOUNTER — Encounter (HOSPITAL_BASED_OUTPATIENT_CLINIC_OR_DEPARTMENT_OTHER): Payer: Self-pay | Admitting: Family Medicine

## 2023-09-29 VITALS — BP 155/78 | HR 72 | Ht 70.0 in | Wt 139.7 lb

## 2023-09-29 DIAGNOSIS — E782 Mixed hyperlipidemia: Secondary | ICD-10-CM | POA: Diagnosis not present

## 2023-09-29 DIAGNOSIS — E559 Vitamin D deficiency, unspecified: Secondary | ICD-10-CM

## 2023-09-29 DIAGNOSIS — I10 Essential (primary) hypertension: Secondary | ICD-10-CM | POA: Diagnosis not present

## 2023-09-29 NOTE — Assessment & Plan Note (Signed)
 Can continue with current medications.  Will plan to recheck cholesterol panel shortly before next appointment.

## 2023-09-29 NOTE — Progress Notes (Signed)
 New Patient Office Visit  Subjective   Patient ID: Leslie Cardenas, male    DOB: Jul 05, 1940  Age: 83 y.o. MRN: [id number]  CC:  Chief Complaint  Patient presents with   Establish Care    Establish care - when its gets around 50 degree there is no circulation in his hand    HPI Leslie Cardenas presents to establish care Last PCP - Dr. Cassondra Cliff  Patient with history of hypertension.  Currently taking olmesartan -hydrochlorothiazide.  Denies any issues with medication.  Does not need refill at this time.  Vitamin D  deficiency: Does take vitamin D  supplement, prior vitamin D  levels have been within normal range.  Hyperlipidemia: Currently taking atorvastatin , denies any issues with medication.  Also has associated hypertriglyceridemia for which he takes ezetimibe .  No issues with medications currently.  COPD: Does follow with pulmonology.  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  Patient is originally from [place], now lives in [place]. He was in the [employer]. He enjoys playing golf.  Patient is close to the [place]  Outpatient Encounter Medications as of 09/29/2023  Medication Sig   acetaminophen  (TYLENOL ) 500 MG tablet Take 1,000 mg by mouth every 6 (six) hours as needed for mild pain (pain score 1-3) or moderate pain (pain score 4-6). Patient only uses when needed.   acetic acid -hydrocortisone  (VOSOL -HC) OTIC solution Instill 3 to 4 drops to the affected ear 2 to 3 x /day   albuterol  (VENTOLIN  HFA) 108 (90 Base) MCG/ACT inhaler Inhale 2 puffs into the lungs every 4 (four) hours as needed for wheezing or shortness of breath. Make sure you rinse mouth out after each use.   arformoterol  (BROVANA ) 15 MCG/2ML NEBU Take 2 mLs (15 mcg total) by nebulization 2 (two) times daily.   atorvastatin  (LIPITOR) 80 MG tablet TAKE 1 TABLET BY MOUTH EVERY DAY    azelastine  (ASTELIN ) 0.1 % nasal spray Place 1 spray into both nostrils 2 (two) times daily. Use in each nostril as directed   budesonide  (PULMICORT ) 0.25 MG/2ML nebulizer solution Take 2 mLs (0.25 mg total) by nebulization daily.   busPIRone  (BUSPAR ) 5 MG tablet TAKE 1/2 TO 1 TABLET 3 TIME A DAY FOR ANXIETY   clopidogrel  (PLAVIX ) 75 MG tablet Take 1 tablet (75 mg total) by mouth daily.   Dupilumab  (DUPIXENT ) 300 MG/2ML SOAJ Inject 300 mg into the skin every 14 (fourteen) days. **loading dose received in clinic on 12/23/22   ezetimibe  (ZETIA ) 10 MG tablet TAKE 1 TABLET BY MOUTH EVERY DAY FOR CHOLESTEROL   fexofenadine  (ALLEGRA ) 180 MG tablet Take 1 tablet by mouth daily.   fluticasone  (FLONASE ) 50 MCG/ACT nasal spray Place 1 spray into both nostrils daily.   IBUPROFEN PO Take by mouth. PRN   ipratropium-albuterol  (DUONEB) 0.5-2.5 (3) MG/3ML SOLN Take 3 mLs by nebulization every 6 (six) hours as needed. Max:4 doses per day   loratadine  (CLARITIN ) 10 MG tablet TAKE ONE PILL ONCE DAILY AS NEEDED FOR ITCHING   Multiple Vitamin (MULTIVITAMIN) tablet Take 1 tablet by mouth daily.   naproxen sodium (ALEVE) 220 MG tablet Take 660 mg by mouth daily as needed (Only takes when needed).   neomycin-polymyxin b-dexamethasone  (MAXITROL) 3.5-10000-0.1 SUSP    nitroGLYCERIN  (NITROSTAT ) 0.4 MG SL tablet Place 0.4 mg under the tongue every 5 (five) minutes as needed for chest pain.   nystatin  (MYCOSTATIN )  100000 UNIT/ML suspension Swish 5 mL in the mouth and retain for as long as possible (several minutes) before swallowing.   olmesartan -hydrochlorothiazide (BENICAR  HCT) 20-12.5 MG tablet Take   1 tablet  Daily   for BP                                                     /                                                                   TAKE                                         BY                                                 MOUTH   pantoprazole  (PROTONIX ) 40 MG tablet Take 1 tablet (40 mg total) by mouth daily.    predniSONE  (DELTASONE ) 5 MG tablet TAKE 1 TABLET DAILY AS DIRECTED FOR COPD (Patient taking differently: Take 2.5 mg by mouth daily. One half tablet until completion of current bottle)   VITAMIN D  PO Take 5,000 Units by mouth daily. Taking 10,000 units daily   [DISCONTINUED] Magnesium Oxide -Mg Supplement 500 MG TABS Take 1 tablet by mouth daily. (Patient not taking: Reported on 09/27/2023)   [DISCONTINUED] promethazine -dextromethorphan (PROMETHAZINE -DM) 6.25-15 MG/5ML syrup Take 5 mLs by mouth 4 (four) times daily as needed for cough. (Patient not taking: Reported on 09/27/2023)   No facility-administered encounter medications on file as of 09/29/2023.    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   Coronary artery disease    Diverticulosis 07/16/2016   SIGMOID, NOTED ON COLONOSCOPY   Esophageal stenosis 02/24/2018   noted on endoscopy   Esophageal stricture    Former tobacco use    GERD (gastroesophageal reflux disease)    Grade I diastolic dysfunction 08/09/2017   noted on ECHO   History of hiatal hernia 02/24/2018   Small noted on endoscopy   Hydronephrosis    Hydronephrosis of right kidney 03/04/2018   URETERAL STONE 7 MM, NOTED ONJ CT ABD/PELVIS   Hyperlipidemia    Myocardial infarction [facility])    [id number]   Nephrolithiasis    s/p stent placement   Skin cancer    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 NOTED ON CT ABD/PELVIS   Vitamin D  deficiency    Wears dentures    upper    Past Surgical History:  Procedure Laterality Date   CARDIAC CATHETERIZATION  1983   No intervention performed.  CATARACT EXTRACTION, BILATERAL     COLONOSCOPY     COLONOSCOPY W/ POLYPECTOMY  08/12/2010   Dr. Loy Ruff   COLONOSCOPY W/ POLYPECTOMY  07/16/2016    CYSTOSCOPY W/ URETERAL STENT PLACEMENT Right 03/04/2018   Procedure: CYSTOSCOPY WITH RETROGRADE PYELOGRAM/URETERAL STENT PLACEMENT;  Surgeon: Samson Croak, MD;  Location: [facility] OR;  Service: Urology;  Laterality: Right;   CYSTOSCOPY/URETEROSCOPY/HOLMIUM LASER/STENT PLACEMENT Right 03/16/2018   Procedure: CYSTOSCOPY RIGHT URETEROSCOPY/HOLMIUM LASER/STENT PLACEMENT;  Surgeon: Samson Croak, MD;  Location: [facility];  Service: Urology;  Laterality: Right;   ESOPHAGOGASTRODUODENOSCOPY     INGUINAL HERNIA REPAIR     left   right leg amputation  05/14/1977   SHOULDER ARTHROSCOPY Left 2000   UPPER GASTROINTESTINAL ENDOSCOPY  WITH DILATION  02/24/2018    Family History  Problem Relation Age of Onset   Pancreatic cancer Mother    Hypertension Father    Colon cancer Brother    Leukemia Brother    Prostate cancer Brother    Other Brother        polio   Esophageal cancer Neg Hx    Rectal cancer Neg Hx    Stomach cancer Neg Hx     Social History   Socioeconomic History   Marital status: Divorced    Spouse name: Not on file   Number of children: 4   Years of education: Not on file   Highest education level: 11th grade  Occupational History   Occupation: retired    Associate Professor: RETIRED  Tobacco Use   Smoking status: Former    Current packs/day: 0.00    Average packs/day: 3.0 packs/day for 20.0 years (60.0 ttl pk-yrs)    Types: Cigarettes    Start date: 04/11/1980    Quit date: 04/11/2000    Years since quitting: 23.4    Passive exposure: Past   Smokeless tobacco: Never  Vaping Use   Vaping status: Never Used  Substance and Sexual Activity   Alcohol  use: Not Currently    Comment: rare   Drug use: No   Sexual activity: Not Currently  Other Topics Concern   Not on file  Social History Narrative   [employer]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    Social Drivers of Corporate investment banker Strain: Not on file  Food Insecurity: No Food Insecurity (10/21/2022)   Hunger Vital Sign    Worried About Running Out of Food in the Last Year: Never true    Ran Out of Food in the Last Year: Never true  Transportation Needs: No Transportation Needs (10/21/2022)   PRAPARE - Administrator, Civil Service (Medical): No    Lack of Transportation (Non-Medical): No  Physical Activity: Not on file  Stress: Not on file  Social Connections: Not on file  Intimate Partner Violence: Not At Risk (10/21/2022)   Humiliation, Afraid, Rape, and Kick questionnaire    Fear of Current or Ex-Partner: No    Emotionally Abused: No    Physically Abused: No    Sexually Abused: No    Objective   BP (!) 155/78 (BP Location: Right Arm, Patient Position: Sitting, Cuff Size: Normal)   Pulse 72   Ht 5\' 10"  (1.778 m)   Wt 139 lb 11.2 oz (63.4 kg)  SpO2 99%   BMI 20.04 kg/m   Physical Exam  83 year old male in no acute distress Cardiovascular exam regular rate rhythm Lungs with diffuse expiratory wheezes bilaterally  Assessment & Plan:   Essential hypertension Assessment & Plan: Blood pressure borderline elevated in office today, did improve on recheck.  At this time, can continue with current medication regimen, no changes made today.  Recommend intermittent monitoring of blood pressure at home, DASH diet  Orders: -     CBC with Differential/Platelet; Future -     Comprehensive metabolic panel with GFR; Future -     Hemoglobin A1c; Future -     Lipid panel; Future -     TSH Rfx on Abnormal to Free T4; Future -     VITAMIN D  25 Hydroxy (Vit-D Deficiency, Fractures); Future  Hyperlipidemia, mixed Assessment & Plan: Can continue with current medications.  Will plan to recheck cholesterol panel shortly before next appointment.  Orders: -     Lipid panel; Future  Vitamin D  deficiency Assessment & Plan: Appropriately controlled  on prior labs, we can continue with current supplementation.  We we will recheck vitamin D  level with labs shortly before next appointment  Orders: -     VITAMIN D  25 Hydroxy (Vit-D Deficiency, Fractures); Future  Return in about 4 months (around 01/29/2024) for hypertension, hyperlipidemia.    ___________________________________________ Loana Salvaggio de Peru, MD, ABFM, [facility] Primary Care and Sports Medicine [facility]

## 2023-09-29 NOTE — Assessment & Plan Note (Signed)
 Appropriately controlled on prior labs, we can continue with current supplementation.  We we will recheck vitamin D  level with labs shortly before next appointment

## 2023-09-29 NOTE — Patient Instructions (Signed)
  Medication Instructions:  Your physician recommends that you continue on your current medications as directed. Please refer to the Current Medication list given to you today. --If you need a refill on any your medications before your next appointment, please call your pharmacy first. If no refills are authorized on file call the office.-- Lab Work: Your physician has recommended that you have lab work today: 1 week prior  If you have labs (blood work) drawn today and your tests are completely normal, you will receive your results via MyChart message OR a phone call from our staff.  Please ensure you check your voicemail in the event that you authorized detailed messages to be left on a delegated number. If you have any lab test that is abnormal or we need to change your treatment, we will call you to review the results.   Follow-Up: Your next appointment:   Your physician recommends that you schedule a follow-up appointment in: 4 month follow up with Dr. de Peru  You will receive a text message or e-mail with a link to a survey about your care and experience with us  today! We would greatly appreciate your feedback!   Thanks for letting us  be apart of your health journey!!  Primary Care and Sports Medicine   Dr. Court Distance Peru   We encourage you to activate your patient portal called "MyChart".  Sign up information is provided on this After Visit Summary.  MyChart is used to connect with patients for Virtual Visits (Telemedicine).  Patients are able to view lab/test results, encounter notes, upcoming appointments, etc.  Non-urgent messages can be sent to your provider as well. To learn more about what you can do with MyChart, please visit --  [email or web address].

## 2023-09-29 NOTE — Assessment & Plan Note (Signed)
 Blood pressure borderline elevated in office today, did improve on recheck.  At this time, can continue with current medication regimen, no changes made today.  Recommend intermittent monitoring of blood pressure at home, DASH diet

## 2023-10-16 ENCOUNTER — Other Ambulatory Visit: Payer: Self-pay | Admitting: Neurology

## 2023-10-21 NOTE — Telephone Encounter (Unsigned)
 Copied from CRM [id number]. Topic: Clinical - Medication Refill >> Oct 21, 2023 10:17 AM Marissa P wrote: Medication: tadalafil  20 mg tablet  RX # [phone]  Has the patient contacted their pharmacy? Yes (Agent: If no, request that the patient contact the pharmacy for the refill. If patient does not wish to contact the pharmacy document the reason why and proceed with request.) (Agent: If yes, when and what did the pharmacy advise?)  This is the patient's preferred pharmacy:    [facility] [id number] - [place], [place] - [address] [place] [address] Phone: [phone] Fax: [phone]  Is this the correct pharmacy for this prescription? Yes If no, delete pharmacy and type the correct one.   Has the prescription been filled recently? Yes  Is the patient out of the medication? Yes  Has the patient been seen for an appointment in the last year OR does the patient have an upcoming appointment? Yes  Can we respond through MyChart? No  Agent: Please be advised that Rx refills may take up to 3 business days. We ask that you follow-up with your pharmacy.

## 2023-10-22 ENCOUNTER — Telehealth (HOSPITAL_BASED_OUTPATIENT_CLINIC_OR_DEPARTMENT_OTHER): Payer: Self-pay | Admitting: Family Medicine

## 2023-10-22 NOTE — Telephone Encounter (Signed)
 Copied from CRM [id number]. Topic: Clinical - Medication Refill >> Oct 22, 2023 11:10 AM Baldemar Lev wrote: Medication: Tadalafil  20 MG   Has the patient contacted their pharmacy? Yes (Agent: If no, request that the patient contact the pharmacy for the refill. If patient does not wish to contact the pharmacy document the reason why and proceed with request.) (Agent: If yes, when and what did the pharmacy advise?)  This is the patient's preferred pharmacy:   [facility] [id number] - [place], [place] - [address] [place] [address] Phone: [phone] Fax: [phone]  Is this the correct pharmacy for this prescription? Yes If no, delete pharmacy and type the correct one.   Has the prescription been filled recently? Yes  Is the patient out of the medication? Yes  Has the patient been seen for an appointment in the last year OR does the patient have an upcoming appointment? Yes  Can we respond through MyChart? Yes  Agent: Please be advised that Rx refills may take up to 3 business days. We ask that you follow-up with your pharmacy.

## 2023-10-22 NOTE — Telephone Encounter (Signed)
 Last Fill: 10/01/21 Not on current med list  Last OV: 09/29/23 Next OV: 01/31/24  Routing to provider for review/authorization.

## 2023-10-25 ENCOUNTER — Telehealth (HOSPITAL_BASED_OUTPATIENT_CLINIC_OR_DEPARTMENT_OTHER): Payer: Self-pay | Admitting: Family Medicine

## 2023-10-25 ENCOUNTER — Ambulatory Visit (INDEPENDENT_AMBULATORY_CARE_PROVIDER_SITE_OTHER): Admitting: Orthopedic Surgery

## 2023-10-25 DIAGNOSIS — Z89511 Acquired absence of right leg below knee: Secondary | ICD-10-CM | POA: Diagnosis not present

## 2023-10-25 MED ORDER — TADALAFIL 20 MG PO TABS: 10.0000 mg | ORAL_TABLET | ORAL | 1 refills | Status: DC | PRN

## 2023-10-25 NOTE — Telephone Encounter (Signed)
 Please let patient know this message has been sent to the provider who is out of the office until tomorrow. We will let him know once he returns if he is able to refill this medication

## 2023-10-25 NOTE — Telephone Encounter (Signed)
 Patient came by to ask about a prescription filled by his previous PCP that he needs refilled Tadalafil  20 mg he would like called in to [facility] [address]  Please advise thank you!

## 2023-10-25 NOTE — Telephone Encounter (Signed)
 Patient advised with verbal understanding

## 2023-10-25 NOTE — Telephone Encounter (Signed)
 Called patient back to let them know medication would be called in to [facility] he was very thankful!

## 2023-11-02 ENCOUNTER — Encounter: Payer: Self-pay | Admitting: Orthopedic Surgery
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
## 2023-11-11 ENCOUNTER — Other Ambulatory Visit: Payer: Self-pay

## 2023-11-11 ENCOUNTER — Other Ambulatory Visit

## 2023-11-11 ENCOUNTER — Ambulatory Visit (INDEPENDENT_AMBULATORY_CARE_PROVIDER_SITE_OTHER): Admitting: Orthopedic Surgery

## 2023-11-11 DIAGNOSIS — M79644 Pain in right finger(s): Secondary | ICD-10-CM

## 2023-11-11 DIAGNOSIS — G8929 Other chronic pain: Secondary | ICD-10-CM | POA: Diagnosis not present

## 2023-11-11 DIAGNOSIS — M25511 Pain in right shoulder: Secondary | ICD-10-CM | POA: Diagnosis not present

## 2023-11-12 DIAGNOSIS — G8929 Other chronic pain: Secondary | ICD-10-CM

## 2023-11-12 DIAGNOSIS — M25511 Pain in right shoulder: Secondary | ICD-10-CM | POA: Diagnosis not present

## 2023-11-12 MED ORDER — LIDOCAINE HCL 1 % IJ SOLN
5.0000 mL | INTRAMUSCULAR | Status: AC | PRN
  Administered 2023-11-12: 5 mL

## 2023-11-12 MED ORDER — METHYLPREDNISOLONE ACETATE 40 MG/ML IJ SUSP
40.0000 mg | INTRAMUSCULAR | Status: AC | PRN
  Administered 2023-11-12: 40 mg via INTRA_ARTICULAR

## 2023-11-12 NOTE — Progress Notes (Signed)
 Office Visit Note   Patient: Leslie Cardenas           Date of Birth: 05-24-1941           MRN: [id number] Visit Date: 11/11/2023              Requested by: de Peru, Raymond J, MD [address] [place],  [place] [address] PCP: de Peru, Raymond J, MD  Chief Complaint  Patient presents with   Right Shoulder - Pain   Right Hand - Pain      HPI: Patient is an 83 year old gentleman who presents with triggering of the right thumb and impingement symptoms of the right shoulder.  Patient states the shoulder is getting worse.  He has decreased range of motion with elevation and internal rotation.  Patient states this is affecting his golf.  He denies any radicular pain into the upper or lower extremities.  Assessment & Plan: Visit Diagnoses:  1. Chronic right shoulder pain   2. Chronic pain of right thumb     Plan: Recommended Voltaren gel for the trigger finger.  Discussed that A1 pulley release is a surgical option.  Right shoulder was injected in the subacromial space he tolerated this well.  Follow-Up Instructions: No follow-ups on file.   Ortho Exam  Patient is alert, oriented, no adenopathy, well-dressed, normal affect, normal respiratory effort. Examination of the right hand patient does not have pain to palpation of the carpometacarpal joint of the thumb.  The first dorsal extensor compartment is not tender to palpation.  Patient does have palpable triggering with range of motion of the thumb over the A1 pulley.  Examination of right shoulder patient has abduction and flexion to 120 degrees.  He has pain reproduced with Neer and Hawkins impingement test.  The biceps tendon is tender to palpation.    Imaging: No results found. No images are attached to the encounter.  Labs: Lab Results  Component Value Date   HGBA1C 6.3 (H) 03/01/2023   HGBA1C 6.1 (H) 07/29/2022   HGBA1C 6.0 (H) 06/11/2022   LABURIC 7.8 02/12/2016   REPTSTATUS 10/22/2022 FINAL 10/22/2022    REPTSTATUS 11/02/2022 FINAL 10/22/2022   GRAMSTAIN  10/22/2022    FEW WBC PRESENT, PREDOMINANTLY PMN ABUNDANT GRAM NEGATIVE RODS FEW GRAM POSITIVE RODS FEW GRAM POSITIVE COCCI IN PAIRS    CULT  10/22/2022    ABUNDANT SERRATIA MARCESCENS ABUNDANT PANDORA SPP SEE SEPARATE REPORT Performed at [facility] Lab, [address] [place], [place] [address]    [facility] SERRATIA MARCESCENS 10/22/2022     Lab Results  Component Value Date   ALBUMIN 3.4 (L) 04/11/2023   ALBUMIN 3.2 (L) 10/21/2022   ALBUMIN 3.4 (L) 06/11/2022    Lab Results  Component Value Date   MG 2.0 05/31/2023   MG 1.8 03/01/2023   MG 1.8 11/26/2022   Lab Results  Component Value Date   VD25OH 99 03/01/2023   VD25OH 37 07/29/2022   VD25OH 42 12/31/2021    No results found for: "PREALBUMIN"    Latest Ref Rng & Units 05/31/2023   11:16 AM 04/11/2023    8:04 PM 04/11/2023    7:55 PM  CBC EXTENDED  WBC 3.8 - 10.8 Thousand/uL 9.1   13.2   RBC 4.20 - 5.80 Million/uL 3.83   3.81   Hemoglobin 13.2 - 17.1 g/dL 65.7  84.6  96.2   HCT 38.5 - 50.0 % 37.1  37.0  36.9   Platelets 140 - 400  Thousand/uL 350   483   NEUT# 1,500 - 7,800 cells/uL 6,907   10.5   Lymph# 0.7 - 4.0 K/uL   0.9      There is no height or weight on file to calculate BMI.  Orders:  Orders Placed This Encounter  Procedures   XR Shoulder Right   XR Finger Thumb Right   No orders of the defined types were placed in this encounter.    Procedures: Large Joint Inj: R subacromial bursa on 11/12/2023 4:35 PM Indications: diagnostic evaluation and pain Details: 22 G 1.5 in needle, posterior approach  Arthrogram: No  Medications: 5 mL lidocaine  1 %; 40 mg methylPREDNISolone  acetate 40 MG/ML Outcome: tolerated well, no immediate complications Procedure, treatment alternatives, risks and benefits explained, specific risks discussed. Consent was given by the patient. Immediately prior to procedure a time out was called to verify the correct  patient, procedure, equipment, support staff and site/side marked as required. Patient was prepped and draped in the usual sterile fashion.      Clinical Data: No additional findings.  ROS:  All other systems negative, except as noted in the HPI. Review of Systems  Objective: Vital Signs: There were no vitals taken for this visit.  Specialty Comments:  No specialty comments available.  PMFS History: Patient Active Problem List   Diagnosis Date Noted   Deviated nasal septum 09/10/2022   Nasal valve collapse 09/10/2022   Nasal crusting 09/10/2022   Blurred vision 12/21/2020   TIA (transient ischemic attack) 12/21/2020   Thoracic aortic atherosclerosis (HCC) by CXR on 07/29/2018 06/05/2020   Hiccups 11/12/2019   Indigestion 11/12/2019   Weight loss 11/12/2019   Acute hyponatremia 10/26/2019   Electrolyte imbalance 10/26/2019   Closed fracture of twelfth thoracic vertebra (HCC) 10/24/2019   Sprain of ligaments of cervical spine 10/22/2019   Bilateral sensorineural hearing loss 06/14/2019   FH: hypertension 04/27/2018   CKD (chronic kidney disease) stage 2, GFR 60-89 ml/min 04/27/2018   Abnormal glucose 04/27/2018   Nephrolithiasis 03/04/2018   Hydronephrosis 03/04/2018   Subacute osteomyelitis of right tibia (HCC) 09/22/2017   S/P unilateral BKA (below knee amputation), right (HCC) 09/22/2017   GERD 02/15/2015   ASHD hx/o MI 07/25/2014   Hx of right BKA (HCC) 01/15/2014   Essential hypertension 07/05/2013   Prediabetes 07/05/2013   Vitamin D  deficiency 07/05/2013   History of colonic polyps 03/30/2012   Hyperlipidemia, mixed 11/13/2011   COPD (chronic obstructive pulmonary disease) (HCC) 11/13/2011   Sinus bradycardia 11/13/2011   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   Coronary  artery disease    Diverticulosis 07/16/2016   SIGMOID, NOTED ON COLONOSCOPY   Esophageal stenosis 02/24/2018   noted on endoscopy   Esophageal stricture    Former tobacco use    GERD (gastroesophageal reflux disease)    Grade I diastolic dysfunction 08/09/2017   noted on ECHO   History of hiatal hernia 02/24/2018   Small noted on endoscopy   Hydronephrosis    Hydronephrosis of right kidney 03/04/2018   URETERAL STONE 7 MM, NOTED ONJ CT ABD/PELVIS   Hyperlipidemia    Myocardial infarction [facility])    [id number]   Nephrolithiasis    s/p stent placement   Skin cancer    Traumatic  amputation of right leg (HCC)    Ureteral stone 03/04/2018   URETERAL STONE 7 MM. NOTED ON CT ABD/PELVIS   Vitamin D  deficiency    Wears dentures    upper    Family History  Problem Relation Age of Onset   Pancreatic cancer Mother    Hypertension Father    Colon cancer Brother    Leukemia Brother    Prostate cancer Brother    Other Brother        polio   Esophageal cancer Neg Hx    Rectal cancer Neg Hx    Stomach cancer Neg Hx     Past Surgical History:  Procedure Laterality Date   CARDIAC CATHETERIZATION  1983   No intervention performed.    CATARACT EXTRACTION, BILATERAL     COLONOSCOPY     COLONOSCOPY W/ POLYPECTOMY  08/12/2010   Dr. Loy Ruff   COLONOSCOPY W/ POLYPECTOMY  07/16/2016   CYSTOSCOPY W/ URETERAL STENT PLACEMENT Right 03/04/2018   Procedure: CYSTOSCOPY WITH RETROGRADE PYELOGRAM/URETERAL STENT PLACEMENT;  Surgeon: Samson Croak, MD;  Location: [facility] OR;  Service: Urology;  Laterality: Right;   CYSTOSCOPY/URETEROSCOPY/HOLMIUM LASER/STENT PLACEMENT Right 03/16/2018   Procedure: CYSTOSCOPY RIGHT URETEROSCOPY/HOLMIUM LASER/STENT PLACEMENT;  Surgeon: Samson Croak, MD;  Location[facility];  Service: Urology;  Laterality: Right;   ESOPHAGOGASTRODUODENOSCOPY     INGUINAL HERNIA REPAIR     left   right leg amputation  05/14/1977   SHOULDER ARTHROSCOPY Left 2000    UPPER GASTROINTESTINAL ENDOSCOPY  WITH DILATION  02/24/2018   Social History   Occupational History   Occupation: retired    Associate Professor: RETIRED  Tobacco Use   Smoking status: Former    Current packs/day: 0.00    Average packs/day: 3.0 packs/day for 20.0 years (60.0 ttl pk-yrs)    Types: Cigarettes    Start date: 04/11/1980    Quit date: 04/11/2000    Years since quitting: 23.6    Passive exposure: Past   Smokeless tobacco: Never  Vaping Use   Vaping status: Never Used  Substance and Sexual Activity   Alcohol  use: Not Currently    Comment: rare   Drug use: No   Sexual activity: Not Currently

## 2023-11-18 ENCOUNTER — Other Ambulatory Visit: Payer: Self-pay | Admitting: Internal Medicine

## 2023-11-18 DIAGNOSIS — J4489 Other specified chronic obstructive pulmonary disease: Secondary | ICD-10-CM

## 2023-11-18 MED ORDER — DUPIXENT 300 MG/2ML ~~LOC~~ SOAJ: 300.0000 mg | SUBCUTANEOUS | 1 refills | Status: AC

## 2023-11-18 NOTE — Telephone Encounter (Signed)
 Refill sent for DUPIXENT  to [facility]: [phone]  Dose: 300mg  subcut every 14 days  Last OV: 09/27/2023 Provider: Dr. Dione Franks  Next OV: 12/30/23  Geraldene Kleine, PharmD, MPH, BCPS Clinical Pharmacist (Rheumatology and Pulmonology)

## 2023-11-18 NOTE — Telephone Encounter (Signed)
 Copied from CRM [id number]. Topic: Clinical - Medication Refill >> Nov 18, 2023  9:52 AM Isabell A wrote: Medication: Dupilumab  (DUPIXENT ) 300 MG/2ML SOAJ  Has the patient contacted their pharmacy? Yes (Agent: If no, request that the patient contact the pharmacy for the refill. If patient does not wish to contact the pharmacy document the reason why and proceed with request.) (Agent: If yes, when and what did the pharmacy advise?)  This is the patient's preferred pharmacy:  [facility] - [place], [place] - [address] [place] [address] Phone: [phone] Fax: [phone]  Is this the correct pharmacy for this prescription? Yes If no, delete pharmacy and type the correct one.   Has the prescription been filled recently? Yes  Is the patient out of the medication? Yes  Has the patient been seen for an appointment in the last year OR does the patient have an upcoming appointment? Yes  Can we respond through MyChart? No  Agent: Please be advised that Rx refills may take up to 3 business days. We ask that you follow-up with your pharmacy.

## 2023-11-20 ENCOUNTER — Other Ambulatory Visit: Payer: Self-pay | Admitting: Gastroenterology

## 2023-11-21 NOTE — Telephone Encounter (Signed)
 Med refills request on patient of yours, Leslie Cardenas.  I think this comes to me because I did a procedure on him once.  - HD

## 2023-11-24 ENCOUNTER — Other Ambulatory Visit: Payer: Self-pay | Admitting: Internal Medicine

## 2023-11-24 MED ORDER — ARFORMOTEROL TARTRATE 15 MCG/2ML IN NEBU: 15.0000 ug | INHALATION_SOLUTION | Freq: Two times a day (BID) | RESPIRATORY_TRACT | 11 refills | Status: AC

## 2023-11-24 MED ORDER — BUDESONIDE 0.25 MG/2ML IN SUSP: 0.2500 mg | Freq: Every day | RESPIRATORY_TRACT | 12 refills | Status: AC

## 2023-11-24 NOTE — Telephone Encounter (Signed)
 Copied from CRM [id number]. Topic: Clinical - Medication Refill >> Nov 24, 2023 11:43 AM Crist Dominion wrote: Medication: arformoterol  (BROVANA ) 15 MCG/2ML NEBU budesonide  (PULMICORT ) 0.25 MG/2ML nebulizer solution  Has the patient contacted their pharmacy? Yes patient states the pharmacy requested these refills on Monday.  (Agent: If no, request that the patient contact the pharmacy for the refill. If patient does not wish to contact the pharmacy document the reason why and proceed with request.) (Agent: If yes, when and what did the pharmacy advise?)  This is the patient's preferred pharmacy:  [id number] [place], [place] - [address] [place] [address] Phone: [phone] Fax: [phone]    Is this the correct pharmacy for this prescription? Yes If no, delete pharmacy and type the correct one.   Has the prescription been filled recently? Yes  Is the patient out of the medication? Yes  Has the patient been seen for an appointment in the last year OR does the patient have an upcoming appointment? Yes  Can we respond through MyChart? No  Agent: Please be advised that Rx refills may take up to 3 business days. We ask that you follow-up with your pharmacy.

## 2023-11-24 NOTE — Telephone Encounter (Signed)
 FYI Only or Action Required?: Action required by provider  Patient is followed in Pulmonology for COPD-Asthma, last seen on 09/27/2023 by Aleck Hurdle, MD. Called Nurse Triage reporting No chief complaint on file.. Symptoms began today. Interventions attempted: Aaron Aas Symptoms are: patient is requesting a refill on medication.

## 2023-11-25 ENCOUNTER — Ambulatory Visit: Admitting: Orthopedic Surgery

## 2023-11-25 ENCOUNTER — Telehealth: Payer: Self-pay | Admitting: Internal Medicine

## 2023-11-25 NOTE — Telephone Encounter (Signed)
 [facility] and verified that they have the prescription for the arformoterol  (Brovana ) and Budesonide  (Pulmicort ).  I was told that the Arformoterol  was ready.  Nothing further needed.  ATC patient x1.  Left detailed message (DPR) letting him know the above.  Sent mychart message as well.

## 2023-11-25 NOTE — Telephone Encounter (Signed)
 PT states he called yesterday about getting a refill on 2 scripts.I so not see a tel encounter.  Brovana  & Arformterol. (Both the same so not sure what he means.)   [facility] [address]

## 2023-11-26 ENCOUNTER — Ambulatory Visit: Payer: Medicare PPO | Admitting: Nurse Practitioner

## 2023-11-26 NOTE — Telephone Encounter (Signed)
 Called and spoke with patient and made him aware that Brovana  was sent in by Dr. Dione Franks on 6/18 as well as the Budesonide .  I let him know that the Brovana  was ready, however, the Budesonide  was not ready.  I let him know I would [facility] to ask them to fill the budesonide  so he could get them at the same time.  He verbalized understanding.  I [facility] and was told that he received a 3 month supply of the budesonide  in April of 2025 (6 boxes) and that is why he is not due for refill until July.  He got a 1 month supply of the Brovana  and that is why that is ready for pick up.  I ATC x1 (home #).  No answer.  Left detailed message (DPR) with the above information.    I ATC x1 (mobile #) and spoke with [employer] (DPR), advised of above information.  She stated that he is out of the budesonide  and they only have what they have.  I looked at the script and reviewed that the budesonide  is only 1 time daily, the Brovana  is 2 times a day.  She said that is why he ran out because he had been doing them both 2 times per day.  I let her know that I had left him a message on his home #.  She said she would explain it to him.  I let her know that he had refills for both of them.  She verbalized understanding.  Nothing further needed.

## 2023-12-02 ENCOUNTER — Ambulatory Visit: Admitting: Family Medicine

## 2023-12-06 ENCOUNTER — Ambulatory Visit: Payer: Medicare PPO | Admitting: Nurse Practitioner

## 2023-12-27 ENCOUNTER — Telehealth (HOSPITAL_COMMUNITY): Payer: Self-pay

## 2023-12-27 NOTE — Telephone Encounter (Signed)
 Pt insurance is active and benefits verified through [facility]. Co-pay $25, DED $400/$0 met, out of pocket $9,350/53.03 met, co-insurance 0%. No pre-authorization required. Spoke with Nevi on 12/27/2023 @ 9:00am, REF# [id number].

## 2023-12-27 NOTE — Telephone Encounter (Signed)
 Called patient to see if he was interested in participating in the Pulmonary Rehab Program. Patient will come in for orientation on 8/04 and will attend the 10:15 exercise class.  Went over insurance, patient is aware he has a $400 deductible to meet before qualifying for his $25 copay.  Pensions consultant.

## 2023-12-30 ENCOUNTER — Encounter: Payer: Self-pay | Admitting: Internal Medicine

## 2023-12-30 ENCOUNTER — Ambulatory Visit: Admitting: Internal Medicine

## 2023-12-30 VITALS — BP 132/70 | HR 67 | Ht 69.0 in | Wt 132.0 lb

## 2023-12-30 DIAGNOSIS — J309 Allergic rhinitis, unspecified: Secondary | ICD-10-CM

## 2023-12-30 DIAGNOSIS — J441 Chronic obstructive pulmonary disease with (acute) exacerbation: Secondary | ICD-10-CM

## 2023-12-30 DIAGNOSIS — J4489 Other specified chronic obstructive pulmonary disease: Secondary | ICD-10-CM | POA: Diagnosis not present

## 2023-12-30 MED ORDER — ALBUTEROL SULFATE HFA 108 (90 BASE) MCG/ACT IN AERS: 2.0000 | INHALATION_SPRAY | RESPIRATORY_TRACT | 11 refills | Status: AC | PRN

## 2023-12-30 NOTE — Progress Notes (Signed)
 Leslie Cardenas    [id number]    09-30-1940  Primary Care Physician:de Peru, Raymond J, MD Date of Appointment: 12/30/2023 Established Patient Visit  Chief complaint:   Chief Complaint  Patient presents with   Follow-up    Asthma  ACT 9      HPI: Leslie Cardenas is a 83 y.o. 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 He likes this much more. He has been completely weaned off prednisone  and feeling ok.   Uses flutter valve after neb treatments to help bring up mucus.  Still on dupixent  and has had elimination of oral steroids on this.   Decreased appetite per wife  Trying to stay active in the yard  Current Regimen: brovana , budesonide  nebs, dupixent  Asthma Triggers: URIs, allergies Exacerbations in the last year: previously on daily oral steroids History of hospitalization or intubation: Allergy  Testing/rhinitis: yes claritin  or allegra  GERD: occasional, not on meds ACT:  Asthma Control Test ACT Total Score  12/30/2023  9:33 AM 9   FeNO: Serum Eos/IgE:   Social history 60 pack years, quit 2001.  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  Lives at home with his ex-wife - lives in [place].  No childhood respiratory disease.   I have reviewed the patient's family social and past medical history and updated as appropriate.   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   Coronary artery disease    Diverticulosis 07/16/2016   SIGMOID, NOTED ON COLONOSCOPY   Esophageal stenosis 02/24/2018   noted on  endoscopy   Esophageal stricture    Former tobacco use    GERD (gastroesophageal reflux disease)    Grade I diastolic dysfunction 08/09/2017   noted on ECHO   History of hiatal hernia 02/24/2018   Small noted on endoscopy   Hydronephrosis    Hydronephrosis of right kidney 03/04/2018   URETERAL STONE 7 MM, NOTED ONJ CT ABD/PELVIS   Hyperlipidemia    Myocardial infarction [facility])    [id number]   Nephrolithiasis    s/p stent placement   Skin cancer    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 NOTED ON CT ABD/PELVIS   Vitamin D  deficiency    Wears dentures    upper    Past Surgical History:  Procedure Laterality Date   CARDIAC CATHETERIZATION  1983   No intervention performed.    CATARACT EXTRACTION, BILATERAL     COLONOSCOPY     COLONOSCOPY W/ POLYPECTOMY  08/12/2010   Dr. Lupita Commander   COLONOSCOPY W/ POLYPECTOMY  07/16/2016   CYSTOSCOPY W/ URETERAL STENT PLACEMENT Right 03/04/2018   Procedure: CYSTOSCOPY WITH RETROGRADE PYELOGRAM/URETERAL STENT PLACEMENT;  Surgeon: Carolee Sherwood JONETTA DOUGLAS, MD;  Location: [facility] OR;  Service: Urology;  Laterality: Right;   CYSTOSCOPY/URETEROSCOPY/HOLMIUM LASER/STENT PLACEMENT Right 03/16/2018   Procedure: CYSTOSCOPY RIGHT URETEROSCOPY/HOLMIUM LASER/STENT PLACEMENT;  Surgeon: Carolee Sherwood JONETTA DOUGLAS, MD;  Location: [facility] [place];  Service: Urology;  Laterality: Right;   ESOPHAGOGASTRODUODENOSCOPY     INGUINAL HERNIA REPAIR     left   right leg amputation  05/14/1977   SHOULDER ARTHROSCOPY Left 2000   UPPER GASTROINTESTINAL ENDOSCOPY  WITH DILATION  02/24/2018    Family History  Problem Relation Age of Onset   Pancreatic cancer Mother    Hypertension Father    Colon cancer Brother    Leukemia Brother    Prostate cancer Brother    Other Brother        polio   Esophageal cancer Neg Hx    Rectal cancer Neg Hx    Stomach cancer Neg Hx     Social History   Occupational History   Occupation:  retired    Associate Professor: RETIRED  Tobacco Use   Smoking status: Former    Current packs/day: 0.00    Average packs/day: 3.0 packs/day for 20.0 years (60.0 ttl pk-yrs)    Types: Cigarettes    Start date: 04/11/1980    Quit date: 04/11/2000    Years since quitting: 23.7    Passive exposure: Past   Smokeless tobacco: Never  Vaping Use   Vaping status: Never Used  Substance and Sexual Activity   Alcohol  use: Not Currently    Comment: rare   Drug use: No   Sexual activity: Not Currently     Physical Exam: Blood pressure 132/70, pulse 67, height 5' 9 (1.753 m), weight 132 lb (59.9 kg), SpO2 95%.  Gen:      No distress ENT:  mmm no thrush Lungs:  diminished, no wheezes or crackles CV:        RRR   Data Reviewed: Imaging: I have personally reviewed the ct angio may 2024 shows moderate centrilobular emphysema  PFTs:      No data to display         I have personally reviewed the patient's PFTs and severe ariflow limitation + BD response.  ONO Jan 2023 - required oxygen    Labs:  Lab Results  Component Value Date   WBC 9.1 05/31/2023   HGB 12.3 (L) 05/31/2023   HCT 37.1 (L) 05/31/2023   MCV 96.9 05/31/2023   PLT 350 05/31/2023   Lab Results  Component Value Date   NA 140 05/31/2023   K 5.2 05/31/2023   CO2 31 05/31/2023   GLUCOSE 85 05/31/2023   BUN 14 05/31/2023   CREATININE 1.41 (H) 05/31/2023   CALCIUM  9.7 05/31/2023   EGFR 49 (L) 05/31/2023   GFRNONAA 35 (L) 04/11/2023    Region 2 allergy  panel in 02/2023 - negative, ige 9.    Immunization status: Immunization History  Administered Date(s) Administered   Fluad Quad(high Dose 65+) 03/19/2022   Fluad Trivalent(High Dose 65+) 02/17/2023   Influenza Split 04/08/2012   Influenza, High Dose Seasonal PF 03/04/2017, 04/14/2021   Influenza,inj,Quad PF,6+ Mos 05/16/2015, 02/15/2018   Moderna Sars-Covid-2 Vaccination 07/15/2019, 08/12/2019   Pneumococcal Conjugate-13 07/25/2014   Pneumococcal  Polysaccharide-23 04/08/2012   Pneumococcal-Unspecified 06/06/2002   Td 06/30/2011   Tdap 10/21/2019   Zoster, Live 05/22/2008    External Records Personally Reviewed: pulmonary  Assessment:  Eosinophilc asthma copd overlap syndrome, improved GERD, controlled Chronic Allergic Rhinitis, controlled Chronic glucocorticoid use - resolved  Plan: I am glad you are off the prednisone . Glad your breathing is doing okay. Continue the Brovana  nebulizer twice daily Continue the budesonide  nebulizer once a day Continue the DuoNeb nebulizer treatments up to 4 times a day as needed  for symptoms of chest tightness, wheezing, shortness of breath You have the albuterol  inhaler to keep in your pocket when you are out of the house to use as needed. Continue the Dupixent  injections as I feel these are really helping your breathing and reducing your dependence on oral steroids.   Continue the Claritin  or Allegra  for your allergy  symptoms - history of tobacco use - outside window for lung cancer screening based on age and quitting>15 years ago.  - continue acid reflux medication as needed  Return to Care: Return in about 4 months (around 05/01/2024).   Verdon Gore, MD Pulmonary and Critical Care Medicine [facility] Office:[phone]

## 2023-12-30 NOTE — Patient Instructions (Addendum)
 It was a pleasure to see you today!  Please schedule follow up with myself in 4 months.  If my schedule is not open yet, we will contact you with a reminder closer to that time. Please call [phone] if you haven't heard from us  a month before, and always call us  sooner if issues or concerns arise. You can also send us  a message through MyChart, but but aware that this is not to be used for urgent issues and it may take up to 5-7 days to receive a reply. Please be aware that you will likely be able to view your results before I have a chance to respond to them. Please give us  5 business days to respond to any non-urgent results.   I am glad you are off the prednisone . Glad your breathing is doing okay. Continue the Brovana  nebulizer twice daily Continue the budesonide  nebulizer once a day Continue the DuoNeb nebulizer treatments up to 4 times a day as needed for symptoms of chest tightness, wheezing, shortness of breath You have the albuterol  inhaler to keep in your pocket when you are out of the house to use as needed. Continue the Dupixent  injections as I feel these are really helping your breathing and reducing your dependence on oral steroids.   Continue the Claritin  or Allegra  for your allergy  symptoms.  Continue acid reflux medication as needed

## 2024-01-07 ENCOUNTER — Telehealth (HOSPITAL_COMMUNITY): Payer: Self-pay

## 2024-01-07 NOTE — Telephone Encounter (Signed)
 Called pt to confirm his PR appointment on 01/10/24. His wife stated he wasn't going to be able to make it and wants to cancel all of his PR appointments. No specific reason given. We will reschedule at a later date.

## 2024-01-10 ENCOUNTER — Ambulatory Visit (HOSPITAL_COMMUNITY)

## 2024-01-10 ENCOUNTER — Encounter (HOSPITAL_BASED_OUTPATIENT_CLINIC_OR_DEPARTMENT_OTHER): Payer: Self-pay | Admitting: *Deleted

## 2024-01-11 ENCOUNTER — Ambulatory Visit: Payer: Self-pay

## 2024-01-11 NOTE — Telephone Encounter (Signed)
 FYI Only or Action Required?: FYI only for provider.  Patient was last seen in primary care on 09/29/2023 by de Peru, Quintin PARAS, MD.  Called Nurse Triage reporting Fatigue, Failure To Thrive, and Weight Loss.  Symptoms began several weeks ago.  Interventions attempted: Nothing.  Symptoms are: stool incontinence/urgency, generalized fatigue, weight loss, decreased appetite unchanged.  Triage Disposition: See PCP When Office is Open (Within 3 Days)  Patient/caregiver understands and will follow disposition?: Yes          Copied from CRM #[id number]. Topic: Clinical - Red Word Triage >> Jan 11, 2024  8:06 AM [name] wrote: Red Word that prompted transfer to Nurse Triage: The patient's ex-wife, Leslie Cardenas, states the patient has been having extreme fatigue and has been unable to eat. He has been losing weight and feeling generally unwell. Reason for Disposition  [1] MILD weakness (e.g., does not interfere with ability to work, go to school, normal activities) AND [2] persists > 1 week  Answer Assessment - Initial Assessment Questions Leslie Cardenas, friend, on phone for triage and states she is with patient. She states the patient doesn't drink much water . She states he has a cup or two of coffee and Pepsi. Advised for patient to drink more water  and call back for new or worsening symptoms.  1. DESCRIPTION: Describe how you are feeling.     No energy, he thinks he is hungry and he may eat 10 bites and then he is full.. Fatigue, weight loss (13-14 pounds over the past month), decreased appetite, generally unwell.  2. SEVERITY: How bad is it?  Can you stand and walk?     Patient is able to stand, walk and perform his daily activities by himself. However, friend Leslie Cardenas states that he is sleeping more and has no energy.  3. ONSET: When did these symptoms begin? (e.g., hours, days, weeks, months)     X couple of weeks, if not more.  4. CAUSE: What do you think is causing the weakness  or fatigue? (e.g., not drinking enough fluids, medical problem, trouble sleeping)     She states nothing has changed that she can think of. She is concerned that the patient has not had any blood work done in a while.   5. NEW MEDICINES:  Have you started on any new medicines recently? (e.g., opioid pain medicines, benzodiazepines, muscle relaxants, antidepressants, antihistamines, neuroleptics, beta blockers)     No.  6. OTHER SYMPTOMS: Do you have any other symptoms? (e.g., chest pain, fever, cough, SOB, vomiting, diarrhea, bleeding, other areas of pain)     Patient has episodes of stool urgency/incontinence happens a couple times a week x 2-3 months; chronic SOB and productive cough from COPD. Denies chest pain, worsening SOB, fever, vomiting, bleeding.  7. PREGNANCY: Is there any chance you are pregnant? When was your last menstrual period?     N/A.  Protocols used: Weakness (Generalized) and Fatigue-A-AH

## 2024-01-11 NOTE — Telephone Encounter (Signed)
 Spoke with patient he will be here at 1050 instead of 350

## 2024-01-13 ENCOUNTER — Encounter (HOSPITAL_BASED_OUTPATIENT_CLINIC_OR_DEPARTMENT_OTHER): Payer: Self-pay | Admitting: Family Medicine

## 2024-01-13 ENCOUNTER — Ambulatory Visit (HOSPITAL_BASED_OUTPATIENT_CLINIC_OR_DEPARTMENT_OTHER): Admitting: Family Medicine

## 2024-01-13 VITALS — BP 132/67 | HR 51 | Ht 69.0 in | Wt 133.6 lb

## 2024-01-13 DIAGNOSIS — R634 Abnormal weight loss: Secondary | ICD-10-CM

## 2024-01-13 DIAGNOSIS — E559 Vitamin D deficiency, unspecified: Secondary | ICD-10-CM

## 2024-01-13 DIAGNOSIS — I10 Essential (primary) hypertension: Secondary | ICD-10-CM

## 2024-01-13 DIAGNOSIS — R6881 Early satiety: Secondary | ICD-10-CM | POA: Diagnosis not present

## 2024-01-13 NOTE — Assessment & Plan Note (Signed)
 For the past couple months, patient has noticed decreased appetite and early satiety with corresponding weight loss.  He denies experiencing any abdominal pain.  He has had intermittent diarrhea reported.  No blood in the stool noted.  Denies any fever, chills, sweats.  He does have underlying COPD, however does not feel that symptoms are drastically changed, he feels that current breathing status is at baseline.  Denies increased cough, shortness of breath. On exam, patient is in no acute distress, vital signs stable.  Cardiovascular exam with regular rate and rhythm, lungs with expiratory wheezes.  Abdomen with normal bowel sounds, soft, nontender, nondistended. Uncertain etiology for ongoing symptoms, reviewed considerations today.  Can proceed with initial labs for further assessment.  Given associated GI symptoms, would also recommend follow-up with GI specialist.  His last appointment with GI was about 1 year ago.  Provided with contact number for GI office today.  Additional recommendations pending results of labs

## 2024-01-13 NOTE — Assessment & Plan Note (Signed)
 Appropriately controlled on prior labs, can continue with current supplementation.  We we will recheck vitamin D  level today

## 2024-01-13 NOTE — Patient Instructions (Signed)
  Medication Instructions:  Your physician recommends that you continue on your current medications as directed. Please refer to the Current Medication list given to you today. --If you need a refill on any your medications before your next appointment, please call your pharmacy first. If no refills are authorized on file call the office.-- Lab Work: Your physician has recommended that you have lab work today: today If you have labs (blood work) drawn today and your tests are completely normal, you will receive your results via MyChart message OR a phone call from our staff.  Please ensure you check your voicemail in the event that you authorized detailed messages to be left on a delegated number. If you have any lab test that is abnormal or we need to change your treatment, we will call you to review the results.  Referrals/Procedures/Imaging: Lisette Plough - [phone]  Follow-Up: Your next appointment:   Your physician recommends that you schedule a follow-up appointment in: cancel 2 week appt / 4-6 week follow up with Dr. de Peru  You will receive a text message or e-mail with a link to a survey about your care and experience with us  today! We would greatly appreciate your feedback!   Thanks for letting us  be apart of your health journey!!  Primary Care and Sports Medicine   Dr. Quintin sheerer Peru   We encourage you to activate your patient portal called MyChart.  Sign up information is provided on this After Visit Summary.  MyChart is used to connect with patients for Virtual Visits (Telemedicine).  Patients are able to view lab/test results, encounter notes, upcoming appointments, etc.  Non-urgent messages can be sent to your provider as well. To learn more about what you can do with MyChart, please visit --  [email or web address].

## 2024-01-13 NOTE — Progress Notes (Signed)
    Procedures performed today:    None.  Independent interpretation of notes and tests performed by another provider:   None.  Brief History, Exam, Impression, and Recommendations:    BP 132/67 (BP Location: Right Arm, Patient Position: Sitting, Cuff Size: Normal)   Pulse (!) 51   Ht 5' 9 (1.753 m)   Wt 133 lb 9.6 oz (60.6 kg)   SpO2 98%   BMI 19.73 kg/m   Weight loss Assessment & Plan: For the past couple months, patient has noticed decreased appetite and early satiety with corresponding weight loss.  He denies experiencing any abdominal pain.  He has had intermittent diarrhea reported.  No blood in the stool noted.  Denies any fever, chills, sweats.  He does have underlying COPD, however does not feel that symptoms are drastically changed, he feels that current breathing status is at baseline.  Denies increased cough, shortness of breath. On exam, patient is in no acute distress, vital signs stable.  Cardiovascular exam with regular rate and rhythm, lungs with expiratory wheezes.  Abdomen with normal bowel sounds, soft, nontender, nondistended. Uncertain etiology for ongoing symptoms, reviewed considerations today.  Can proceed with initial labs for further assessment.  Given associated GI symptoms, would also recommend follow-up with GI specialist.  His last appointment with GI was about 1 year ago.  Provided with contact number for GI office today.  Additional recommendations pending results of labs  Orders: -     CBC with Differential/Platelet -     Comprehensive metabolic panel with GFR -     TSH Rfx on Abnormal to Free T4 -     Hemoglobin A1c  Early satiety -     CBC with Differential/Platelet -     Comprehensive metabolic panel with GFR  Vitamin D  deficiency Assessment & Plan: Appropriately controlled on prior labs, can continue with current supplementation.  We we will recheck vitamin D  level today  Orders: -     VITAMIN D  25 Hydroxy (Vit-D Deficiency,  Fractures)  Essential hypertension -     CBC with Differential/Platelet -     Comprehensive metabolic panel with GFR -     Hemoglobin A1c  No follow-ups on file.   ___________________________________________ Enna Warwick de Peru, MD, ABFM, CAQSM Primary Care and Sports Medicine [facility]

## 2024-01-14 ENCOUNTER — Ambulatory Visit (HOSPITAL_BASED_OUTPATIENT_CLINIC_OR_DEPARTMENT_OTHER): Payer: Self-pay | Admitting: Family Medicine

## 2024-01-14 LAB — CBC WITH DIFFERENTIAL/PLATELET
Basophils Absolute: 0.1 x10E3/uL (ref 0.0–0.2)
Basos: 1 %
EOS (ABSOLUTE): 0.4 x10E3/uL (ref 0.0–0.4)
Eos: 4 %
Hematocrit: 41.1 % (ref 37.5–51.0)
Hemoglobin: 13.6 g/dL (ref 13.0–17.7)
Immature Grans (Abs): 0 x10E3/uL (ref 0.0–0.1)
Immature Granulocytes: 0 %
Lymphocytes Absolute: 0.9 x10E3/uL (ref 0.7–3.1)
Lymphs: 9 %
MCH: 32.9 pg (ref 26.6–33.0)
MCHC: 33.1 g/dL (ref 31.5–35.7)
MCV: 99 fL — ABNORMAL HIGH (ref 79–97)
Monocytes Absolute: 0.8 x10E3/uL (ref 0.1–0.9)
Monocytes: 8 %
Neutrophils Absolute: 7.5 x10E3/uL — ABNORMAL HIGH (ref 1.4–7.0)
Neutrophils: 78 %
Platelets: 390 x10E3/uL (ref 150–450)
RBC: 4.14 x10E6/uL (ref 4.14–5.80)
RDW: 13.2 % (ref 11.6–15.4)
WBC: 9.7 x10E3/uL (ref 3.4–10.8)

## 2024-01-14 LAB — COMPREHENSIVE METABOLIC PANEL WITH GFR
ALT: 14 IU/L (ref 0–44)
AST: 21 IU/L (ref 0–40)
Albumin: 4.3 g/dL (ref 3.7–4.7)
Alkaline Phosphatase: 93 IU/L (ref 44–121)
BUN/Creatinine Ratio: 13 (ref 10–24)
BUN: 17 mg/dL (ref 8–27)
Bilirubin Total: <0.2 mg/dL (ref 0.0–1.2)
CO2: 22 mmol/L (ref 20–29)
Calcium: 10.4 mg/dL — ABNORMAL HIGH (ref 8.6–10.2)
Chloride: 101 mmol/L (ref 96–106)
Creatinine, Ser: 1.28 mg/dL — ABNORMAL HIGH (ref 0.76–1.27)
Globulin, Total: 2.3 g/dL (ref 1.5–4.5)
Glucose: 84 mg/dL (ref 70–99)
Potassium: 5 mmol/L (ref 3.5–5.2)
Sodium: 137 mmol/L (ref 134–144)
Total Protein: 6.6 g/dL (ref 6.0–8.5)
eGFR: 55 mL/min/1.73 — ABNORMAL LOW (ref >59)

## 2024-01-14 LAB — HEMOGLOBIN A1C
Est. average glucose Bld gHb Est-mCnc: 123 mg/dL
Hgb A1c MFr Bld: 5.9 % — ABNORMAL HIGH (ref 4.8–5.6)

## 2024-01-14 LAB — TSH RFX ON ABNORMAL TO FREE T4: TSH: 1.15 u[IU]/mL (ref 0.450–4.500)

## 2024-01-14 LAB — VITAMIN D 25 HYDROXY (VIT D DEFICIENCY, FRACTURES): Vit D, 25-Hydroxy: 43.8 ng/mL (ref 30.0–100.0)

## 2024-01-18 ENCOUNTER — Ambulatory Visit (HOSPITAL_COMMUNITY)

## 2024-01-19 ENCOUNTER — Telehealth (HOSPITAL_COMMUNITY): Payer: Self-pay

## 2024-01-19 NOTE — Telephone Encounter (Signed)
 Called pt to reschedule his Pulmonary rehab orientation. Patient is on vacation this week and would like for us  to call back next week.

## 2024-01-20 ENCOUNTER — Ambulatory Visit (HOSPITAL_COMMUNITY)

## 2024-01-25 ENCOUNTER — Ambulatory Visit (HOSPITAL_COMMUNITY)

## 2024-01-26 ENCOUNTER — Telehealth (HOSPITAL_COMMUNITY): Payer: Self-pay

## 2024-01-26 NOTE — Telephone Encounter (Signed)
 Called pt to schedule his PR orientation and pts wife stated he is not wanting to do the program anymore. We will close his referral.

## 2024-01-27 ENCOUNTER — Ambulatory Visit (HOSPITAL_COMMUNITY)

## 2024-01-31 ENCOUNTER — Ambulatory Visit (HOSPITAL_BASED_OUTPATIENT_CLINIC_OR_DEPARTMENT_OTHER): Admitting: Family Medicine

## 2024-02-01 ENCOUNTER — Ambulatory Visit (HOSPITAL_COMMUNITY)

## 2024-02-03 ENCOUNTER — Ambulatory Visit (HOSPITAL_COMMUNITY)

## 2024-02-08 ENCOUNTER — Ambulatory Visit (HOSPITAL_COMMUNITY)

## 2024-02-10 ENCOUNTER — Ambulatory Visit (HOSPITAL_COMMUNITY)

## 2024-02-14 ENCOUNTER — Encounter (HOSPITAL_BASED_OUTPATIENT_CLINIC_OR_DEPARTMENT_OTHER): Payer: Self-pay | Admitting: Family Medicine

## 2024-02-14 ENCOUNTER — Ambulatory Visit (INDEPENDENT_AMBULATORY_CARE_PROVIDER_SITE_OTHER): Admitting: Family Medicine

## 2024-02-14 VITALS — BP 152/71 | HR 74 | Ht 70.0 in | Wt 136.0 lb

## 2024-02-14 DIAGNOSIS — Z23 Encounter for immunization: Secondary | ICD-10-CM

## 2024-02-14 DIAGNOSIS — I10 Essential (primary) hypertension: Secondary | ICD-10-CM

## 2024-02-14 DIAGNOSIS — R7303 Prediabetes: Secondary | ICD-10-CM | POA: Diagnosis not present

## 2024-02-14 DIAGNOSIS — E559 Vitamin D deficiency, unspecified: Secondary | ICD-10-CM

## 2024-02-14 DIAGNOSIS — J449 Chronic obstructive pulmonary disease, unspecified: Secondary | ICD-10-CM | POA: Diagnosis not present

## 2024-02-14 DIAGNOSIS — E782 Mixed hyperlipidemia: Secondary | ICD-10-CM | POA: Diagnosis not present

## 2024-02-14 MED ORDER — PREDNISONE 2.5 MG PO TABS: 2.5000 mg | ORAL_TABLET | Freq: Two times a day (BID) | ORAL | 0 refills | Status: DC

## 2024-02-14 NOTE — Assessment & Plan Note (Signed)
 Appropriately controlled on prior labs, can continue with current supplementation.  We will monitor vitamin D  level intermittently

## 2024-02-14 NOTE — Patient Instructions (Signed)
  Medication Instructions:  Your physician recommends that you continue on your current medications as directed. Please refer to the Current Medication list given to you today. --If you need a refill on any your medications before your next appointment, please call your pharmacy first. If no refills are authorized on file call the office.--   Follow-Up: Your next appointment:   Your physician recommends that you schedule a follow-up appointment in: 3 months follow up  with Dr. de Peru  You will receive a text message or e-mail with a link to a survey about your care and experience with [place] today! We would greatly appreciate your feedback!   Thanks for letting us be apart of your health journey!!  Primary Care and Sports Medicine   Dr. Ceasar Mons Peru   We encourage you to activate your patient portal called "MyChart".  Sign up information is provided on this After Visit Summary.  MyChart is used to connect with patients for Virtual Visits (Telemedicine).  Patients are able to view lab/test results, encounter notes, upcoming appointments, etc.  Non-urgent messages can be sent to your provider as well. To learn more about what you can do with MyChart, please visit --  [email or web address].

## 2024-02-14 NOTE — Progress Notes (Signed)
    Procedures performed today:    None.  Independent interpretation of notes and tests performed by another provider:   None.  Brief History, Exam, Impression, and Recommendations:    BP (!) 152/71 (BP Location: Left Arm, Patient Position: Sitting, Cuff Size: Normal)   Pulse 74   Ht 5' 10 (1.778 m)   Wt 136 lb (61.7 kg)   SpO2 96%   BMI 19.51 kg/m   Essential hypertension Assessment & Plan: Blood pressure elevated in office today, remains elevated on recheck.  He does continue with medications as prescribed. No concerns with chest pain or headaches.  At this time, can continue with current medication regimen, no changes made today.  Recommend intermittent monitoring of blood pressure at home, DASH diet   Encounter for immunization -     Flu vaccine HIGH DOSE PF(Fluzone Trivalent)  Chronic obstructive pulmonary disease, unspecified COPD type [facility]) Assessment & Plan: Patient continues to follow with pulmonology.  He was gradually tapered off of prednisone , was taking 5 mg twice daily.  He feels that his breathing/congestion/phlegm has worsened since stopping prednisone .  He does continue with inhalers as instructed.  He has questions about resuming prednisone . We did discuss considerations today related to management of COPD.  We discussed potential risk associated with long-term steroid use.  His next appointment with pulmonology is in 2 months.  After discussion, we will proceed with resuming prednisone , but at lower dose and can monitor response.  Ultimately, may also need to discuss further with pulmonologist about adjusting current medication regimen.   Vitamin D  deficiency Assessment & Plan: Appropriately controlled on prior labs, can continue with current supplementation.  We will monitor vitamin D  level intermittently   Hyperlipidemia, mixed Assessment & Plan: Can continue with current medications.  Will plan to recheck cholesterol panel in the  future   Prediabetes Assessment & Plan: A1c continues to be within prediabetes range, improved from last check Can continue with intermittent monitoring of A1c, focus on lifestyle modifications   Other orders -     predniSONE ; Take 1 tablet (2.5 mg total) by mouth 2 (two) times daily with a meal.  Dispense: 180 tablet; Refill: 0  Return in about 3 months (around 05/15/2024).   ___________________________________________ Leslie Cardenas de Peru, MD, ABFM, CAQSM Primary Care and Sports Medicine [facility]

## 2024-02-14 NOTE — Assessment & Plan Note (Signed)
 Blood pressure elevated in office today, remains elevated on recheck.  He does continue with medications as prescribed. No concerns with chest pain or headaches.  At this time, can continue with current medication regimen, no changes made today.  Recommend intermittent monitoring of blood pressure at home, DASH diet

## 2024-02-14 NOTE — Assessment & Plan Note (Signed)
 Patient continues to follow with pulmonology.  He was gradually tapered off of prednisone , was taking 5 mg twice daily.  He feels that his breathing/congestion/phlegm has worsened since stopping prednisone .  He does continue with inhalers as instructed.  He has questions about resuming prednisone . We did discuss considerations today related to management of COPD.  We discussed potential risk associated with long-term steroid use.  His next appointment with pulmonology is in 2 months.  After discussion, we will proceed with resuming prednisone , but at lower dose and can monitor response.  Ultimately, may also need to discuss further with pulmonologist about adjusting current medication regimen.

## 2024-02-14 NOTE — Assessment & Plan Note (Signed)
 Can continue with current medications.  Will plan to recheck cholesterol panel in the future

## 2024-02-14 NOTE — Assessment & Plan Note (Signed)
 A1c continues to be within prediabetes range, improved from last check Can continue with intermittent monitoring of A1c, focus on lifestyle modifications

## 2024-02-15 ENCOUNTER — Ambulatory Visit (HOSPITAL_COMMUNITY)

## 2024-02-17 ENCOUNTER — Ambulatory Visit (HOSPITAL_COMMUNITY)

## 2024-02-22 ENCOUNTER — Ambulatory Visit (HOSPITAL_COMMUNITY)

## 2024-02-24 ENCOUNTER — Ambulatory Visit (HOSPITAL_COMMUNITY)

## 2024-02-29 ENCOUNTER — Ambulatory Visit (HOSPITAL_COMMUNITY)

## 2024-03-02 ENCOUNTER — Ambulatory Visit (HOSPITAL_COMMUNITY)

## 2024-03-02 ENCOUNTER — Other Ambulatory Visit: Payer: Self-pay | Admitting: Internal Medicine

## 2024-03-07 ENCOUNTER — Ambulatory Visit (HOSPITAL_COMMUNITY)

## 2024-03-09 ENCOUNTER — Ambulatory Visit (HOSPITAL_COMMUNITY)

## 2024-03-14 ENCOUNTER — Ambulatory Visit (HOSPITAL_COMMUNITY)

## 2024-03-16 ENCOUNTER — Ambulatory Visit (HOSPITAL_COMMUNITY)

## 2024-03-21 ENCOUNTER — Ambulatory Visit (HOSPITAL_COMMUNITY)

## 2024-03-21 DIAGNOSIS — H52203 Unspecified astigmatism, bilateral: Secondary | ICD-10-CM | POA: Diagnosis not present

## 2024-03-21 DIAGNOSIS — Z961 Presence of intraocular lens: Secondary | ICD-10-CM | POA: Diagnosis not present

## 2024-03-23 ENCOUNTER — Ambulatory Visit (HOSPITAL_COMMUNITY)

## 2024-03-28 ENCOUNTER — Ambulatory Visit (HOSPITAL_COMMUNITY)

## 2024-03-30 ENCOUNTER — Ambulatory Visit (HOSPITAL_COMMUNITY)

## 2024-04-04 ENCOUNTER — Ambulatory Visit (HOSPITAL_COMMUNITY)

## 2024-04-06 ENCOUNTER — Ambulatory Visit (HOSPITAL_COMMUNITY)

## 2024-04-10 ENCOUNTER — Encounter: Payer: Self-pay | Admitting: Radiology

## 2024-05-02 ENCOUNTER — Ambulatory Visit (HOSPITAL_BASED_OUTPATIENT_CLINIC_OR_DEPARTMENT_OTHER)

## 2024-05-12 ENCOUNTER — Other Ambulatory Visit (HOSPITAL_BASED_OUTPATIENT_CLINIC_OR_DEPARTMENT_OTHER): Payer: Self-pay | Admitting: Family Medicine

## 2024-05-12 NOTE — Telephone Encounter (Signed)
 Please advise if it is too soon to refill med or if it is okay to send Rx.

## 2024-05-16 ENCOUNTER — Ambulatory Visit (HOSPITAL_BASED_OUTPATIENT_CLINIC_OR_DEPARTMENT_OTHER): Admitting: Family Medicine

## 2024-05-30 ENCOUNTER — Encounter: Payer: Self-pay | Admitting: Family Medicine

## 2024-05-30 ENCOUNTER — Ambulatory Visit
Admission: RE | Admit: 2024-05-30 | Discharge: 2024-05-30 | Disposition: A | Discharge status: Home / Self Care | Source: Ambulatory Visit | Attending: Family Medicine | Admitting: Family Medicine

## 2024-05-30 ENCOUNTER — Ambulatory Visit: Admitting: Family Medicine

## 2024-05-30 VITALS — BP 136/72 | HR 84 | Temp 97.9°F | Ht 70.0 in | Wt 131.0 lb

## 2024-05-30 DIAGNOSIS — M5431 Sciatica, right side: Secondary | ICD-10-CM | POA: Diagnosis not present

## 2024-05-30 DIAGNOSIS — I10 Essential (primary) hypertension: Secondary | ICD-10-CM | POA: Diagnosis not present

## 2024-05-30 DIAGNOSIS — R7303 Prediabetes: Secondary | ICD-10-CM

## 2024-05-30 DIAGNOSIS — R634 Abnormal weight loss: Secondary | ICD-10-CM

## 2024-05-30 DIAGNOSIS — J449 Chronic obstructive pulmonary disease, unspecified: Secondary | ICD-10-CM

## 2024-05-30 DIAGNOSIS — E782 Mixed hyperlipidemia: Secondary | ICD-10-CM

## 2024-05-30 DIAGNOSIS — R0602 Shortness of breath: Secondary | ICD-10-CM | POA: Diagnosis not present

## 2024-05-30 MED ORDER — TRAMADOL HCL 50 MG PO TABS: 50.0000 mg | ORAL_TABLET | Freq: Three times a day (TID) | ORAL | 0 refills | Status: AC | PRN

## 2024-05-30 MED ORDER — PREDNISONE 20 MG PO TABS: ORAL_TABLET | ORAL | 0 refills | Status: AC

## 2024-05-30 MED ORDER — AZITHROMYCIN 250 MG PO TABS: ORAL_TABLET | ORAL | 0 refills | Status: AC

## 2024-05-30 NOTE — Progress Notes (Signed)
 "  Subjective:    Patient ID: Leslie Cardenas, male    DOB: Jan 14, 1941, 83 y.o.   MRN: [id number]  Patient is an 83 year old Caucasian gentleman here today to establish care.  He has a history of severe COPD for which he takes formoterol  twice daily and Pulmicort  once daily through a nebulizer machine.  He has a remote history of smoking.  He also has remote history of coronary artery disease with a myocardial infarction in the 59s.  He has not had any heart catheterization or heart problems since that time.  He is currently on olmesartan  hydrochlorothiazide for hypertension.  He is taking Plavix  due to the history of a TIA/mini stroke in the past.  He is on atorvastatin  and Zetia  for secondary prevention of heart attacks and strokes with a goal LDL cholesterol less than 70.  He also reports chronic pain in his back radiating into his right hip and down his right leg to his knee.  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  He has been dealing with this radiating leg pain now for many years.  He is taking Tylenol  but Tylenol  is no longer helpful.  He would like something that he could take occasionally to help mitigate the pain when he lays down at night to sleep.  Over the last few months he has been losing weight.  They have attributed this to protein calorie malnutrition as the patient has poor appetite and also a history of an esophageal stricture.  However on today's exam, the patient has faint bibasilar crackles, he also has diminished lung sounds bilaterally and he is wheezing.  He has not had a chest x-ray in quite some time.  He states that he has been sick recently with worsening cough and worsening shortness of breath.  The sickness began with rhinorrhea and upper respiratory symptoms similar to a cold.  His wife has been hospitalized recently with pneumonia due to the flu. Past Medical History:  Diagnosis Date   Aortic atherosclerosis 03/04/2018   Noted on CT Abd/pelvis    Asthma    Cataract    Bilateral   Colon polyp    COPD (chronic obstructive pulmonary disease) (HCC)    stage 3   Coronary artery disease    Diverticulosis 07/16/2016   SIGMOID, NOTED ON COLONOSCOPY   Esophageal stenosis 02/24/2018   noted on endoscopy   Esophageal stricture    Former tobacco use    GERD (gastroesophageal reflux disease)    Grade I diastolic dysfunction 08/09/2017   noted on ECHO   History of hiatal hernia 02/24/2018   Small noted on endoscopy   Hydronephrosis    Hydronephrosis of right kidney 03/04/2018   URETERAL STONE 7 MM, NOTED ONJ CT ABD/PELVIS   Hyperlipidemia    Myocardial infarction (HCC)    [id number]   Nephrolithiasis    s/p stent placement   Skin cancer    TIA (transient ischemic attack)    on plavix    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 NOTED ON CT ABD/PELVIS   Vitamin D  deficiency    Wears dentures    upper   Past Surgical History:  Procedure Laterality Date   CARDIAC CATHETERIZATION  1983   No intervention performed.    CATARACT EXTRACTION, BILATERAL     COLONOSCOPY     COLONOSCOPY W/ POLYPECTOMY  08/12/2010   Dr. Lupita Commander  COLONOSCOPY W/ POLYPECTOMY  07/16/2016   CYSTOSCOPY W/ URETERAL STENT PLACEMENT Right 03/04/2018   Procedure: CYSTOSCOPY WITH RETROGRADE PYELOGRAM/URETERAL STENT PLACEMENT;  Surgeon: Carolee Sherwood JONETTA DOUGLAS, MD;  Location: [facility];  Service: Urology;  Laterality: Right;   CYSTOSCOPY/URETEROSCOPY/HOLMIUM LASER/STENT PLACEMENT Right 03/16/2018   Procedure: CYSTOSCOPY RIGHT URETEROSCOPY/HOLMIUM LASER/STENT PLACEMENT;  Surgeon: Carolee Sherwood JONETTA DOUGLAS, MD;  Location: [facility];  Service: Urology;  Laterality: Right;   ESOPHAGOGASTRODUODENOSCOPY     INGUINAL HERNIA REPAIR     left   right leg amputation  05/14/1977   SHOULDER ARTHROSCOPY Left 2000   UPPER GASTROINTESTINAL ENDOSCOPY  WITH DILATION  02/24/2018   Current Outpatient Medications on File Prior to  Visit  Medication Sig Dispense Refill   acetaminophen  (TYLENOL ) 500 MG tablet Take 1,000 mg by mouth every 6 (six) hours as needed for mild pain (pain score 1-3) or moderate pain (pain score 4-6). Patient only uses when needed.     albuterol  (VENTOLIN  HFA) 108 (90 Base) MCG/ACT inhaler Inhale 2 puffs into the lungs every 4 (four) hours as needed for wheezing or shortness of breath. 18 g 11   arformoterol  (BROVANA ) 15 MCG/2ML NEBU Take 2 mLs (15 mcg total) by nebulization 2 (two) times daily. 120 mL 11   atorvastatin  (LIPITOR) 80 MG tablet TAKE 1 TABLET BY MOUTH EVERY DAY 90 tablet 1   Azelastine  HCl 137 MCG/SPRAY SOLN INHALE 1 SPRAY INTO EACH NOSTRIL 2 (TWO) TIMES DAILY AS DIRECTED 30 mL 3   budesonide  (PULMICORT ) 0.25 MG/2ML nebulizer solution Take 2 mLs (0.25 mg total) by nebulization daily. 60 mL 12   clopidogrel  (PLAVIX ) 75 MG tablet TAKE 1 TABLET BY MOUTH EVERY DAY 90 tablet 3   Dupilumab  (DUPIXENT ) 300 MG/2ML SOAJ Inject 300 mg into the skin every 14 (fourteen) days. 12 mL 1   ezetimibe  (ZETIA ) 10 MG tablet TAKE 1 TABLET BY MOUTH EVERY DAY FOR CHOLESTEROL 90 tablet 3   fexofenadine  (ALLEGRA ) 180 MG tablet Take 1 tablet by mouth daily.     fluticasone  (FLONASE ) 50 MCG/ACT nasal spray Place 1 spray into both nostrils daily.     IBUPROFEN PO Take by mouth. PRN     ipratropium-albuterol  (DUONEB) 0.5-2.5 (3) MG/3ML SOLN Take 3 mLs by nebulization every 6 (six) hours as needed. Max:4 doses per day 1080 each 1   loratadine  (CLARITIN ) 10 MG tablet TAKE ONE PILL ONCE DAILY AS NEEDED FOR ITCHING 90 tablet 3   Multiple Vitamin (MULTIVITAMIN) tablet Take 1 tablet by mouth daily.     nitroGLYCERIN  (NITROSTAT ) 0.4 MG SL tablet Place 0.4 mg under the tongue every 5 (five) minutes as needed for chest pain.     olmesartan -hydrochlorothiazide (BENICAR  HCT) 20-12.5 MG tablet Take   1 tablet  Daily   for BP                                                     /                                                                    TAKE  BY                                                 MOUTH 90 tablet 3   pantoprazole  (PROTONIX ) 40 MG tablet TAKE 1 TABLET BY MOUTH EVERY DAY 90 tablet 3   VITAMIN D  PO Take 5,000 Units by mouth daily. Taking 10,000 units daily     busPIRone  (BUSPAR ) 5 MG tablet TAKE 1/2 TO 1 TABLET 3 TIME A DAY FOR ANXIETY (Patient not taking: Reported on 05/30/2024) 270 tablet 0   predniSONE  (DELTASONE ) 2.5 MG tablet TAKE 1 TABLET (2.5 MG TOTAL) BY MOUTH 2 (TWO) TIMES DAILY WITH A MEAL. (Patient not taking: Reported on 05/30/2024) 180 tablet 0   No current facility-administered medications on file prior to visit.   No Known Allergies Social History   Socioeconomic History   Marital status: Divorced    Spouse name: Not on file   Number of children: 4   Years of education: Not on file   Highest education level: 11th grade  Occupational History   Occupation: retired    Associate Professor: RETIRED  Tobacco Use   Smoking status: Former    Current packs/day: 0.00    Average packs/day: 3.0 packs/day for 20.0 years (60.0 ttl pk-yrs)    Types: Cigarettes    Start date: 04/11/1980    Quit date: 04/11/2000    Years since quitting: 24.1    Passive exposure: Past   Smokeless tobacco: Never  Vaping Use   Vaping status: Never Used  Substance and Sexual Activity   Alcohol  use: Not Currently    Comment: rare   Drug use: No   Sexual activity: Not Currently  Other Topics Concern   Not on file  Social History Narrative   [employer] - [employer] 4 yrs + 2 reserve   [employer]-65 then to [employer]   Has 4 children he is divorced   Former smoker rare alcohol  no drugs   Lives in [place], [place].    Social Drivers of Health   Tobacco Use: Medium Risk (05/30/2024)   Patient History    Smoking Tobacco Use: Former    Smokeless Tobacco Use: Never    Passive Exposure: Past  Physicist, Medical Strain: Not on file  Food Insecurity: No Food Insecurity (10/21/2022)   Hunger  Vital Sign    Worried About Running Out of Food in the Last Year: Never true    Ran Out of Food in the Last Year: Never true  Transportation Needs: No Transportation Needs (10/21/2022)   PRAPARE - Administrator, Civil Service (Medical): No    Lack of Transportation (Non-Medical): No  Physical Activity: Not on file  Stress: Not on file  Social Connections: Not on file  Intimate Partner Violence: Not At Risk (10/21/2022)   Humiliation, Afraid, Rape, and Kick questionnaire    Fear of Current or Ex-Partner: No    Emotionally Abused: No    Physically Abused: No    Sexually Abused: No  Depression (PHQ2-9): Low Risk (05/30/2024)   Depression (PHQ2-9)    PHQ-2 Score: 0  Alcohol  Screen: Not on file  Housing: Low Risk (10/21/2022)   Housing    Last Housing Risk Score: 0  Utilities: Not At Risk (10/21/2022)   AHC Utilities    Threatened with loss of utilities: No  Health Literacy:  Not on file   Family History  Problem Relation Age of Onset   Pancreatic cancer Mother    Hypertension Father    Colon cancer Brother    Leukemia Brother    Prostate cancer Brother    Other Brother        polio   Esophageal cancer Neg Hx    Rectal cancer Neg Hx    Stomach cancer Neg Hx       Review of Systems  All other systems reviewed and are negative.      Objective:   Physical Exam Vitals reviewed.  Constitutional:      General: He is not in acute distress.    Appearance: He is well-developed. He is not diaphoretic.  Eyes:     General:        Right eye: No discharge.        Left eye: No discharge.     Conjunctiva/sclera: Conjunctivae normal.     Pupils: Pupils are equal, round, and reactive to light.  Neck:     Vascular: No JVD.  Cardiovascular:     Rate and Rhythm: Normal rate and regular rhythm.     Heart sounds: Normal heart sounds. No murmur heard.    No friction rub. No gallop.  Pulmonary:     Effort: Pulmonary effort is normal. No respiratory distress.     Breath  sounds: Decreased air movement present. Decreased breath sounds, wheezing and rales present.  Chest:     Chest wall: No tenderness.  Abdominal:     General: Bowel sounds are normal. There is no distension.     Palpations: Abdomen is soft. There is no mass.     Tenderness: There is no abdominal tenderness. There is no guarding or rebound.  Musculoskeletal:        General: No tenderness or deformity. Normal range of motion.     Cervical back: Normal range of motion and neck supple.       Legs:  Skin:    General: Skin is warm.     Coloration: Skin is not pale.     Findings: Rash present. No erythema.  Neurological:     Mental Status: He is alert and oriented to person, place, and time.     Cranial Nerves: No cranial nerve deficit.     Motor: No abnormal muscle tone.     Coordination: Coordination normal.     Deep Tendon Reflexes: Reflexes are normal and symmetric.  Psychiatric:        Behavior: Behavior normal.        Thought Content: Thought content normal.        Judgment: Judgment normal.           Assessment & Plan:  Chronic obstructive pulmonary disease, unspecified COPD type (HCC) - Plan: DG Chest 2 View  Essential hypertension - Plan: Hemoglobin A1c, CBC with Differential/Platelet, Comprehensive metabolic panel with GFR, Lipid panel  Prediabetes - Plan: Hemoglobin A1c, CBC with Differential/Platelet, Comprehensive metabolic panel with GFR, Lipid panel  Hyperlipidemia, mixed - Plan: Hemoglobin A1c, CBC with Differential/Platelet, Comprehensive metabolic panel with GFR, Lipid panel  SOB (shortness of breath) - Plan: DG Chest 2 View  Weight loss  Right sided sciatica  His weight loss could be due to his esophageal stricture coupled with emphysema and decreased protein calorie intake.  I would manage this by adding Remeron  as an appetite stimulant and encourage Ensure.  However given his history of smoking I would like to  get a chest x-ray especially with his worsening  shortness of breath.  I believe the patient is likely having a COPD exacerbation brought on by his recent exposure to some type of viral illness.  Given the change in sputum character and color I will add a Z-Pak to cover secondary bacterial infections and also start the patient on a prednisone  taper pack.  His blood pressure today is well-controlled.  I will check labs to ensure that his LDL cholesterol is less than 70 and that his A1c is less than 6.5.  I will also check a CBC to ensure that he is not anemic as a contributing factor to his shortness of breath.  If chest x-ray is clear I plan to add Remeron  as an appetite stimulant and encourage Ensure.  Patient is due for the shingles vaccine, a flu shot, and RSV vaccine.  Will wait till after he finishes his prednisone  and antibiotics to administer these.  I will supplement his Tylenol  with tramadol  50 mg every 8 hours as needed for sciatica leg pain in his right leg "

## 2024-05-31 LAB — COMPREHENSIVE METABOLIC PANEL WITH GFR
AG Ratio: 1.6 (calc) (ref 1.0–2.5)
ALT: 11 U/L (ref 9–46)
AST: 15 U/L (ref 10–35)
Albumin: 3.9 g/dL (ref 3.6–5.1)
Alkaline phosphatase (APISO): 74 U/L (ref 35–144)
BUN/Creatinine Ratio: 24 (calc) — ABNORMAL HIGH (ref 6–22)
BUN: 36 mg/dL — ABNORMAL HIGH (ref 7–25)
CO2: 25 mmol/L (ref 20–32)
Calcium: 9.7 mg/dL (ref 8.6–10.3)
Chloride: 104 mmol/L (ref 98–110)
Creat: 1.49 mg/dL — ABNORMAL HIGH (ref 0.70–1.22)
Globulin: 2.5 g/dL (ref 1.9–3.7)
Glucose, Bld: 61 mg/dL — ABNORMAL LOW (ref 65–99)
Potassium: 4.3 mmol/L (ref 3.5–5.3)
Sodium: 138 mmol/L (ref 135–146)
Total Bilirubin: 0.2 mg/dL (ref 0.2–1.2)
Total Protein: 6.4 g/dL (ref 6.1–8.1)
eGFR: 46 mL/min/1.73m2 — ABNORMAL LOW

## 2024-05-31 LAB — CBC WITH DIFFERENTIAL/PLATELET
Absolute Lymphocytes: 893 {cells}/uL (ref 850–3900)
Absolute Monocytes: 1250 {cells}/uL — ABNORMAL HIGH (ref 200–950)
Basophils Absolute: 48 {cells}/uL (ref 0–200)
Basophils Relative: 0.4 %
Eosinophils Absolute: 24 {cells}/uL (ref 15–500)
Eosinophils Relative: 0.2 %
HCT: 38 % — ABNORMAL LOW (ref 39.4–51.1)
Hemoglobin: 12.5 g/dL — ABNORMAL LOW (ref 13.2–17.1)
MCH: 32 pg (ref 27.0–33.0)
MCHC: 32.9 g/dL (ref 31.6–35.4)
MCV: 97.2 fL (ref 81.4–101.7)
MPV: 9.6 fL (ref 7.5–12.5)
Monocytes Relative: 10.5 %
Neutro Abs: 9687 {cells}/uL — ABNORMAL HIGH (ref 1500–7800)
Neutrophils Relative %: 81.4 %
Platelets: 403 Thousand/uL — ABNORMAL HIGH (ref 140–400)
RBC: 3.91 Million/uL — ABNORMAL LOW (ref 4.20–5.80)
RDW: 13.5 % (ref 11.0–15.0)
Total Lymphocyte: 7.5 %
WBC: 11.9 Thousand/uL — ABNORMAL HIGH (ref 3.8–10.8)

## 2024-05-31 LAB — LIPID PANEL
Cholesterol: 141 mg/dL
HDL: 48 mg/dL
LDL Cholesterol (Calc): 70 mg/dL
Non-HDL Cholesterol (Calc): 93 mg/dL
Total CHOL/HDL Ratio: 2.9 (calc)
Triglycerides: 148 mg/dL

## 2024-05-31 LAB — HEMOGLOBIN A1C
Hgb A1c MFr Bld: 6 % — ABNORMAL HIGH
Mean Plasma Glucose: 126 mg/dL
eAG (mmol/L): 7 mmol/L

## 2024-06-01 ENCOUNTER — Ambulatory Visit: Payer: Self-pay | Admitting: Family Medicine

## 2024-06-03 ENCOUNTER — Other Ambulatory Visit: Payer: Self-pay | Admitting: Internal Medicine

## 2024-06-13 ENCOUNTER — Ambulatory Visit (HOSPITAL_BASED_OUTPATIENT_CLINIC_OR_DEPARTMENT_OTHER)

## 2024-06-21 ENCOUNTER — Ambulatory Visit (HOSPITAL_BASED_OUTPATIENT_CLINIC_OR_DEPARTMENT_OTHER): Admitting: Family Medicine
